# Patient Record
Sex: Male | Born: 1940 | Race: White | Hispanic: No | Marital: Married | Smoking: Current every day smoker
Health system: Southern US, Community
[De-identification: ages and names within clinical notes are randomized; demographics above are authoritative.]

## PROBLEM LIST (undated history)

## (undated) DIAGNOSIS — R3129 Other microscopic hematuria: Secondary | ICD-10-CM

## (undated) DIAGNOSIS — K219 Gastro-esophageal reflux disease without esophagitis: Secondary | ICD-10-CM

## (undated) DIAGNOSIS — R911 Solitary pulmonary nodule: Secondary | ICD-10-CM

## (undated) DIAGNOSIS — M4800 Spinal stenosis, site unspecified: Secondary | ICD-10-CM

## (undated) DIAGNOSIS — I10 Essential (primary) hypertension: Secondary | ICD-10-CM

## (undated) DIAGNOSIS — E785 Hyperlipidemia, unspecified: Secondary | ICD-10-CM

## (undated) DIAGNOSIS — H269 Unspecified cataract: Secondary | ICD-10-CM

## (undated) DIAGNOSIS — R29898 Other symptoms and signs involving the musculoskeletal system: Secondary | ICD-10-CM

## (undated) DIAGNOSIS — M5126 Other intervertebral disc displacement, lumbar region: Secondary | ICD-10-CM

## (undated) DIAGNOSIS — T4145XA Adverse effect of unspecified anesthetic, initial encounter: Secondary | ICD-10-CM

## (undated) DIAGNOSIS — T8859XA Other complications of anesthesia, initial encounter: Secondary | ICD-10-CM

## (undated) DIAGNOSIS — Z720 Tobacco use: Secondary | ICD-10-CM

## (undated) DIAGNOSIS — D126 Benign neoplasm of colon, unspecified: Secondary | ICD-10-CM

## (undated) DIAGNOSIS — I4891 Unspecified atrial fibrillation: Secondary | ICD-10-CM

## (undated) DIAGNOSIS — K469 Unspecified abdominal hernia without obstruction or gangrene: Secondary | ICD-10-CM

## (undated) DIAGNOSIS — N400 Enlarged prostate without lower urinary tract symptoms: Secondary | ICD-10-CM

## (undated) DIAGNOSIS — Z860100 Personal history of colon polyps, unspecified: Secondary | ICD-10-CM

## (undated) DIAGNOSIS — C801 Malignant (primary) neoplasm, unspecified: Secondary | ICD-10-CM

## (undated) DIAGNOSIS — N529 Male erectile dysfunction, unspecified: Secondary | ICD-10-CM

## (undated) DIAGNOSIS — I2510 Atherosclerotic heart disease of native coronary artery without angina pectoris: Secondary | ICD-10-CM

## (undated) HISTORY — PX: TONSILLECTOMY: SHX5217

## (undated) HISTORY — DX: Unspecified atrial fibrillation: I48.91

## (undated) HISTORY — DX: Hyperlipidemia, unspecified: E78.5

## (undated) HISTORY — DX: Benign prostatic hyperplasia without lower urinary tract symptoms: N40.0

## (undated) HISTORY — PX: BACK SURGERY: SHX140

## (undated) HISTORY — DX: Other intervertebral disc displacement, lumbar region: M51.26

## (undated) HISTORY — DX: Spinal stenosis, site unspecified: M48.00

## (undated) HISTORY — DX: Atherosclerotic heart disease of native coronary artery without angina pectoris: I25.10

## (undated) HISTORY — DX: Other symptoms and signs involving the musculoskeletal system: R29.898

## (undated) HISTORY — DX: Other microscopic hematuria: R31.29

## (undated) HISTORY — DX: Personal history of colon polyps, unspecified: Z86.0100

## (undated) HISTORY — DX: Gastro-esophageal reflux disease without esophagitis: K21.9

## (undated) HISTORY — DX: Personal history of colonic polyps: Z86.010

## (undated) HISTORY — DX: Benign neoplasm of colon, unspecified: D12.6

## (undated) HISTORY — DX: Male erectile dysfunction, unspecified: N52.9

## (undated) HISTORY — PX: INGUINAL HERNIA REPAIR: SHX194

## (undated) HISTORY — DX: Tobacco use: Z72.0

## (undated) HISTORY — DX: Essential (primary) hypertension: I10

## (undated) HISTORY — DX: Solitary pulmonary nodule: R91.1

---

## 2000-02-19 HISTORY — PX: COLONOSCOPY: SHX174

## 2004-12-04 ENCOUNTER — Ambulatory Visit: Payer: Self-pay | Admitting: Internal Medicine

## 2004-12-18 ENCOUNTER — Ambulatory Visit: Payer: Self-pay | Admitting: Internal Medicine

## 2004-12-24 ENCOUNTER — Ambulatory Visit: Payer: Self-pay | Admitting: Internal Medicine

## 2005-01-04 ENCOUNTER — Ambulatory Visit: Payer: Self-pay | Admitting: Cardiology

## 2005-01-23 ENCOUNTER — Ambulatory Visit: Payer: Self-pay | Admitting: Internal Medicine

## 2006-04-30 ENCOUNTER — Ambulatory Visit: Payer: Self-pay | Admitting: Internal Medicine

## 2006-04-30 LAB — CONVERTED CEMR LAB
ALT: 32 units/L (ref 0–40)
AST: 30 units/L (ref 0–37)
Albumin: 3.5 g/dL (ref 3.5–5.2)
Alkaline Phosphatase: 79 units/L (ref 39–117)
BUN: 15 mg/dL (ref 6–23)
Basophils Absolute: 0 10*3/uL (ref 0.0–0.1)
Basophils Relative: 0.3 % (ref 0.0–1.0)
Bilirubin Urine: NEGATIVE
Bilirubin, Direct: 0.2 mg/dL (ref 0.0–0.3)
CO2: 29 meq/L (ref 19–32)
Calcium: 9 mg/dL (ref 8.4–10.5)
Chloride: 106 meq/L (ref 96–112)
Cholesterol: 125 mg/dL (ref 0–200)
Creatinine, Ser: 1.1 mg/dL (ref 0.4–1.5)
Crystals: NEGATIVE
Eosinophils Absolute: 0.1 10*3/uL (ref 0.0–0.6)
Eosinophils Relative: 0.6 % (ref 0.0–5.0)
GFR calc Af Amer: 86 mL/min
GFR calc non Af Amer: 71 mL/min
Glucose, Bld: 118 mg/dL — ABNORMAL HIGH (ref 70–99)
HCT: 39.8 % (ref 39.0–52.0)
HDL: 35.3 mg/dL — ABNORMAL LOW (ref >39.0)
Hemoglobin: 13.8 g/dL (ref 13.0–17.0)
Ketones, ur: NEGATIVE mg/dL
LDL Cholesterol: 73 mg/dL (ref 0–99)
Leukocytes, UA: NEGATIVE
Lymphocytes Relative: 33.5 % (ref 12.0–46.0)
MCHC: 34.7 g/dL (ref 30.0–36.0)
MCV: 88.1 fL (ref 78.0–100.0)
Monocytes Absolute: 1.1 10*3/uL — ABNORMAL HIGH (ref 0.2–0.7)
Monocytes Relative: 12 % — ABNORMAL HIGH (ref 3.0–11.0)
Mucus, UA: NEGATIVE
Neutro Abs: 4.7 10*3/uL (ref 1.4–7.7)
Neutrophils Relative %: 53.6 % (ref 43.0–77.0)
Nitrite: NEGATIVE
PSA: 2.15 ng/mL (ref 0.10–4.00)
Platelets: 182 10*3/uL (ref 150–400)
Potassium: 4 meq/L (ref 3.5–5.1)
RBC: 4.52 M/uL (ref 4.22–5.81)
RDW: 13.8 % (ref 11.5–14.6)
Sodium: 143 meq/L (ref 135–145)
Specific Gravity, Urine: 1.025 (ref 1.000–1.03)
TSH: 1.36 microintl units/mL (ref 0.35–5.50)
Total Bilirubin: 0.6 mg/dL (ref 0.3–1.2)
Total CHOL/HDL Ratio: 3.5
Total Protein, Urine: NEGATIVE mg/dL
Total Protein: 7.1 g/dL (ref 6.0–8.3)
Triglycerides: 82 mg/dL (ref 0–149)
Urine Glucose: NEGATIVE mg/dL
Urobilinogen, UA: 0.2 (ref 0.0–1.0)
VLDL: 16 mg/dL (ref 0–40)
WBC: 8.9 10*3/uL (ref 4.5–10.5)
pH: 6 (ref 5.0–8.0)

## 2006-05-05 ENCOUNTER — Ambulatory Visit: Payer: Self-pay | Admitting: Internal Medicine

## 2006-05-19 ENCOUNTER — Encounter: Payer: Self-pay | Admitting: Internal Medicine

## 2006-05-20 ENCOUNTER — Ambulatory Visit: Payer: Self-pay

## 2006-05-20 ENCOUNTER — Encounter: Payer: Self-pay | Admitting: Internal Medicine

## 2006-06-16 ENCOUNTER — Ambulatory Visit: Payer: Self-pay | Admitting: Internal Medicine

## 2007-02-05 ENCOUNTER — Ambulatory Visit: Payer: Self-pay | Admitting: Internal Medicine

## 2007-02-05 DIAGNOSIS — I10 Essential (primary) hypertension: Secondary | ICD-10-CM

## 2007-02-05 DIAGNOSIS — E782 Mixed hyperlipidemia: Secondary | ICD-10-CM

## 2007-02-05 LAB — CONVERTED CEMR LAB
ALT: 34 units/L (ref 0–53)
AST: 22 units/L (ref 0–37)
Albumin: 3.7 g/dL (ref 3.5–5.2)
Alkaline Phosphatase: 77 units/L (ref 39–117)
BUN: 13 mg/dL (ref 6–23)
Bilirubin, Direct: 0.1 mg/dL (ref 0.0–0.3)
CO2: 30 meq/L (ref 19–32)
Calcium: 9.2 mg/dL (ref 8.4–10.5)
Chloride: 109 meq/L (ref 96–112)
Cholesterol: 116 mg/dL (ref 0–200)
Creatinine, Ser: 1 mg/dL (ref 0.4–1.5)
GFR calc Af Amer: 96 mL/min
GFR calc non Af Amer: 79 mL/min
Glucose, Bld: 109 mg/dL — ABNORMAL HIGH (ref 70–99)
HDL: 33.5 mg/dL — ABNORMAL LOW (ref >39.0)
Hgb A1c MFr Bld: 6 % (ref 4.6–6.0)
LDL Cholesterol: 69 mg/dL (ref 0–99)
Potassium: 4.5 meq/L (ref 3.5–5.1)
Sodium: 145 meq/L (ref 135–145)
Total Bilirubin: 0.7 mg/dL (ref 0.3–1.2)
Total CHOL/HDL Ratio: 3.5
Total Protein: 7.1 g/dL (ref 6.0–8.3)
Triglycerides: 66 mg/dL (ref 0–149)
VLDL: 13 mg/dL (ref 0–40)

## 2007-02-09 DIAGNOSIS — D126 Benign neoplasm of colon, unspecified: Secondary | ICD-10-CM | POA: Insufficient documentation

## 2007-02-10 ENCOUNTER — Ambulatory Visit: Payer: Self-pay | Admitting: Internal Medicine

## 2007-02-10 DIAGNOSIS — M545 Low back pain: Secondary | ICD-10-CM

## 2007-02-10 DIAGNOSIS — R3129 Other microscopic hematuria: Secondary | ICD-10-CM

## 2007-02-10 DIAGNOSIS — F17200 Nicotine dependence, unspecified, uncomplicated: Secondary | ICD-10-CM

## 2007-08-07 ENCOUNTER — Ambulatory Visit: Payer: Self-pay | Admitting: Internal Medicine

## 2007-08-07 ENCOUNTER — Encounter: Payer: Self-pay | Admitting: Internal Medicine

## 2007-08-07 ENCOUNTER — Other Ambulatory Visit: Admission: RE | Admit: 2007-08-07 | Discharge: 2007-08-07 | Payer: Self-pay | Admitting: Internal Medicine

## 2007-08-07 LAB — CONVERTED CEMR LAB
ALT: 30 units/L (ref 0–53)
AST: 22 units/L (ref 0–37)
Albumin: 4 g/dL (ref 3.5–5.2)
Alkaline Phosphatase: 78 units/L (ref 39–117)
BUN: 13 mg/dL (ref 6–23)
Bacteria, UA: NEGATIVE
Bilirubin Urine: NEGATIVE
Bilirubin, Direct: 0.1 mg/dL (ref 0.0–0.3)
CO2: 30 meq/L (ref 19–32)
Calcium: 9.5 mg/dL (ref 8.4–10.5)
Chloride: 106 meq/L (ref 96–112)
Cholesterol: 136 mg/dL (ref 0–200)
Creatinine, Ser: 1 mg/dL (ref 0.4–1.5)
Crystals: NEGATIVE
GFR calc Af Amer: 96 mL/min
GFR calc non Af Amer: 79 mL/min
Glucose, Bld: 105 mg/dL — ABNORMAL HIGH (ref 70–99)
HDL: 39 mg/dL (ref >39.0)
Hgb A1c MFr Bld: 5.9 % (ref 4.6–6.0)
Ketones, ur: NEGATIVE mg/dL
LDL Cholesterol: 84 mg/dL (ref 0–99)
Leukocytes, UA: NEGATIVE
Mucus, UA: NEGATIVE
Nitrite: NEGATIVE
PSA: 2.29 ng/mL (ref 0.10–4.00)
Potassium: 4.1 meq/L (ref 3.5–5.1)
Sodium: 142 meq/L (ref 135–145)
Specific Gravity, Urine: 1.01 (ref 1.000–1.03)
Squamous Epithelial / HPF: NEGATIVE /lpf
Total Bilirubin: 0.7 mg/dL (ref 0.3–1.2)
Total CHOL/HDL Ratio: 3.5
Total Protein, Urine: NEGATIVE mg/dL
Total Protein: 7.5 g/dL (ref 6.0–8.3)
Triglycerides: 63 mg/dL (ref 0–149)
Urine Glucose: NEGATIVE mg/dL
Urobilinogen, UA: 0.2 (ref 0.0–1.0)
VLDL: 13 mg/dL (ref 0–40)
WBC, UA: NONE SEEN cells/hpf
pH: 6.5 (ref 5.0–8.0)

## 2007-08-09 DIAGNOSIS — N401 Enlarged prostate with lower urinary tract symptoms: Secondary | ICD-10-CM

## 2007-08-12 ENCOUNTER — Telehealth: Payer: Self-pay | Admitting: Internal Medicine

## 2007-08-28 ENCOUNTER — Telehealth: Payer: Self-pay | Admitting: Internal Medicine

## 2007-10-20 ENCOUNTER — Ambulatory Visit: Payer: Self-pay | Admitting: Internal Medicine

## 2007-10-20 DIAGNOSIS — F528 Other sexual dysfunction not due to a substance or known physiological condition: Secondary | ICD-10-CM

## 2007-11-27 ENCOUNTER — Telehealth: Payer: Self-pay | Admitting: Internal Medicine

## 2008-03-29 ENCOUNTER — Telehealth: Payer: Self-pay | Admitting: Internal Medicine

## 2008-03-30 ENCOUNTER — Telehealth: Payer: Self-pay | Admitting: Internal Medicine

## 2008-04-05 ENCOUNTER — Ambulatory Visit: Payer: Self-pay | Admitting: Internal Medicine

## 2008-04-05 LAB — CONVERTED CEMR LAB
ALT: 29 units/L (ref 0–53)
AST: 24 units/L (ref 0–37)
Albumin: 3.7 g/dL (ref 3.5–5.2)
Alkaline Phosphatase: 72 units/L (ref 39–117)
BUN: 16 mg/dL (ref 6–23)
Basophils Absolute: 0 10*3/uL (ref 0.0–0.1)
Basophils Relative: 0.4 % (ref 0.0–3.0)
Bilirubin, Direct: 0.1 mg/dL (ref 0.0–0.3)
CO2: 30 meq/L (ref 19–32)
Calcium: 9.4 mg/dL (ref 8.4–10.5)
Chloride: 107 meq/L (ref 96–112)
Cholesterol: 149 mg/dL (ref 0–200)
Creatinine, Ser: 0.9 mg/dL (ref 0.4–1.5)
Eosinophils Absolute: 0.1 10*3/uL (ref 0.0–0.7)
Eosinophils Relative: 1.1 % (ref 0.0–5.0)
GFR calc Af Amer: 108 mL/min
GFR calc non Af Amer: 89 mL/min
Glucose, Bld: 102 mg/dL — ABNORMAL HIGH (ref 70–99)
HCT: 46.6 % (ref 39.0–52.0)
HDL: 37.8 mg/dL — ABNORMAL LOW (ref >39.0)
Hemoglobin: 16.2 g/dL (ref 13.0–17.0)
LDL Cholesterol: 86 mg/dL (ref 0–99)
Lymphocytes Relative: 38.3 % (ref 12.0–46.0)
MCHC: 34.7 g/dL (ref 30.0–36.0)
MCV: 90 fL (ref 78.0–100.0)
Monocytes Absolute: 0.4 10*3/uL (ref 0.1–1.0)
Monocytes Relative: 3.9 % (ref 3.0–12.0)
Neutro Abs: 6.6 10*3/uL (ref 1.4–7.7)
Neutrophils Relative %: 56.3 % (ref 43.0–77.0)
Platelets: 217 10*3/uL (ref 150–400)
Potassium: 4.3 meq/L (ref 3.5–5.1)
RBC: 5.17 M/uL (ref 4.22–5.81)
RDW: 13.7 % (ref 11.5–14.6)
Sodium: 143 meq/L (ref 135–145)
TSH: 0.89 microintl units/mL (ref 0.35–5.50)
Total Bilirubin: 0.9 mg/dL (ref 0.3–1.2)
Total CHOL/HDL Ratio: 3.9
Total Protein: 7.3 g/dL (ref 6.0–8.3)
Triglycerides: 128 mg/dL (ref 0–149)
VLDL: 26 mg/dL (ref 0–40)
WBC: 11.5 10*3/uL — ABNORMAL HIGH (ref 4.5–10.5)

## 2008-04-12 ENCOUNTER — Ambulatory Visit: Payer: Self-pay | Admitting: Internal Medicine

## 2008-04-18 DIAGNOSIS — D126 Benign neoplasm of colon, unspecified: Secondary | ICD-10-CM

## 2008-04-18 HISTORY — DX: Benign neoplasm of colon, unspecified: D12.6

## 2008-04-28 ENCOUNTER — Ambulatory Visit: Payer: Self-pay | Admitting: Gastroenterology

## 2008-05-02 ENCOUNTER — Telehealth: Payer: Self-pay | Admitting: Internal Medicine

## 2008-05-12 ENCOUNTER — Ambulatory Visit: Payer: Self-pay | Admitting: Gastroenterology

## 2008-05-12 ENCOUNTER — Encounter: Payer: Self-pay | Admitting: Gastroenterology

## 2008-05-13 ENCOUNTER — Encounter: Payer: Self-pay | Admitting: Gastroenterology

## 2008-10-19 ENCOUNTER — Ambulatory Visit: Payer: Self-pay | Admitting: Family Medicine

## 2009-03-23 ENCOUNTER — Telehealth: Payer: Self-pay | Admitting: Family Medicine

## 2009-05-29 ENCOUNTER — Encounter: Admission: RE | Admit: 2009-05-29 | Discharge: 2009-05-29 | Payer: Self-pay | Admitting: Family Medicine

## 2009-05-29 ENCOUNTER — Ambulatory Visit: Payer: Self-pay | Admitting: Family Medicine

## 2009-05-30 LAB — CONVERTED CEMR LAB
ALT: 31 units/L (ref 0–53)
AST: 22 units/L (ref 0–37)
Albumin: 4.1 g/dL (ref 3.5–5.2)
Alkaline Phosphatase: 75 units/L (ref 39–117)
BUN: 13 mg/dL (ref 6–23)
Basophils Absolute: 0 10*3/uL (ref 0.0–0.1)
Basophils Relative: 0.4 % (ref 0.0–3.0)
Bilirubin, Direct: 0 mg/dL (ref 0.0–0.3)
CO2: 29 meq/L (ref 19–32)
Calcium: 9.5 mg/dL (ref 8.4–10.5)
Chloride: 106 meq/L (ref 96–112)
Cholesterol: 143 mg/dL (ref 0–200)
Creatinine, Ser: 1 mg/dL (ref 0.4–1.5)
Eosinophils Absolute: 0.2 10*3/uL (ref 0.0–0.7)
Eosinophils Relative: 1.6 % (ref 0.0–5.0)
GFR calc non Af Amer: 78.78 mL/min (ref >60)
Glucose, Bld: 96 mg/dL (ref 70–99)
HCT: 45.6 % (ref 39.0–52.0)
HDL: 44.4 mg/dL (ref >39.00)
Hemoglobin: 15.9 g/dL (ref 13.0–17.0)
LDL Cholesterol: 75 mg/dL (ref 0–99)
Lymphocytes Relative: 36.3 % (ref 12.0–46.0)
Lymphs Abs: 4 10*3/uL (ref 0.7–4.0)
MCHC: 34.8 g/dL (ref 30.0–36.0)
MCV: 90.3 fL (ref 78.0–100.0)
Monocytes Absolute: 0.7 10*3/uL (ref 0.1–1.0)
Monocytes Relative: 6.2 % (ref 3.0–12.0)
Neutro Abs: 6.1 10*3/uL (ref 1.4–7.7)
Neutrophils Relative %: 55.5 % (ref 43.0–77.0)
PSA: 1.28 ng/mL (ref 0.10–4.00)
Platelets: 201 10*3/uL (ref 150.0–400.0)
Potassium: 4.6 meq/L (ref 3.5–5.1)
RBC: 5.05 M/uL (ref 4.22–5.81)
RDW: 14.7 % — ABNORMAL HIGH (ref 11.5–14.6)
Sodium: 143 meq/L (ref 135–145)
Total Bilirubin: 0.6 mg/dL (ref 0.3–1.2)
Total CHOL/HDL Ratio: 3
Total Protein: 8.2 g/dL (ref 6.0–8.3)
Triglycerides: 119 mg/dL (ref 0.0–149.0)
VLDL: 23.8 mg/dL (ref 0.0–40.0)
WBC: 10.9 10*3/uL — ABNORMAL HIGH (ref 4.5–10.5)

## 2009-06-08 ENCOUNTER — Encounter: Payer: Self-pay | Admitting: Family Medicine

## 2009-12-12 ENCOUNTER — Telehealth: Payer: Self-pay | Admitting: Family Medicine

## 2010-03-20 NOTE — Progress Notes (Signed)
Summary: refill request for proscar and viagra  Phone Note Refill Request Message from:  Patient  Refills Requested: Medication #1:  PROSCAR 5 MG TABS Take 1 tablet by mouth once a day  Medication #2:  VIAGRA 100 MG TABS one by mouth once daily prn Please send to [facility].  Initial call taken by: Lowella Petties CMA, AAMA,  December 12, 2009 9:37 AM  Follow-up for Phone Call        ov 05/2009  ok to fill without MD input until 05/2010 Follow-up by: Hannah Beat MD,  December 12, 2009 1:37 PM  Additional Follow-up for Phone Call Additional follow up Details #1::        Dr. Patsy Lager, we arent allowed to refill these meds without MD approval, thats why we send them to you everytime, I know it's a hassle, but thats our rule.  Do you want to give a years refills on viagra? Additional Follow-up by: Lowella Petties CMA, AAMA,  December 12, 2009 2:23 PM    Additional Follow-up for Phone Call Additional follow up Details #2::    My understanding is non-controlled substances, including proscar and viagra can be refilled by [facility] protocol.  Dr. Alphonsus Sias - is this not right? We need to make sure everyone on same protocol, refills, etc. I thought we had talked about this a while ago.  If not, we should revisit at next MD meeting.  Thanks. Spencer Copland MD  December 12, 2009 3:23 PM   No actually if the patient has kept up with exams, all non controlled meds may be refilled per protocol for 1 year. We can review this again at our next staff meeting Cindee Salt MD  December 13, 2009 7:44 AM   Additional Follow-up for Phone Call Additional follow up Details #3:: Details for Additional Follow-up Action Taken: Jacki Cones, please refill x 1 year. Thanks. Hannah Beat MD  December 13, 2009 8:51 AM   Meds refilled.              Lowella Petties CMA, AAMA  December 13, 2009 9:33 AM   Prescriptions: VIAGRA 100 MG TABS (SILDENAFIL CITRATE) one by mouth once daily prn  #10 x 11   Entered  by:   Lowella Petties CMA, AAMA   Authorized by:   Hannah Beat MD   Signed by:   Lowella Petties CMA, AAMA on 12/13/2009   Method used:   Electronically to        [id number]       [address]       [place], [place]  [address]       Ph: [phone]       Fax: [phone]   RxID:   [id number] PROSCAR 5 MG TABS (FINASTERIDE) Take 1 tablet by mouth once a day  #90 x 3   Entered by:   Lowella Petties CMA, AAMA   Authorized by:   Hannah Beat MD   Signed by:   Lowella Petties CMA, AAMA on 12/13/2009   Method used:   Electronically to        [id number] (retail[facility]       [address]       [place], [place]  [address]       Ph: [phone]       Fax: [phone]   RxID:   [id number]

## 2010-03-20 NOTE — Progress Notes (Signed)
Summary: refill request for viagra  Phone Note Refill Request Message from:  Fax from Pharmacy  Refills Requested: Medication #1:  VIAGRA 100 MG TABS one by mouth once daily prn   Last Refilled: 08/13/2008 Faxed request from [facility] [address]  Initial call taken by: Lowella Petties CMA,  March 23, 2009 3:53 PM  Follow-up for Phone Call        Patient seen within 1 year, physician input not needed. Following [facility] protocols, please refill this medication for 1 year. Follow-up by: Hannah Beat MD,  March 23, 2009 4:09 PM  Additional Follow-up for Phone Call Additional follow up Details #1::        Refills sent electronically Additional Follow-up by: Lowella Petties CMA,  March 23, 2009 4:17 PM    Prescriptions: VIAGRA 100 MG TABS (SILDENAFIL CITRATE) one by mouth once daily prn  #10 x 11   Entered by:   Lowella Petties CMA   Authorized by:   Hannah Beat MD   Signed by:   Lowella Petties CMA on 03/23/2009   Method used:   Electronically to        [facility] [address] [phone]* (retail)       [address] [place], [place]  [id number]       Ph: [phone]       Fax: [phone]   RxID:   [id number]

## 2010-03-20 NOTE — Assessment & Plan Note (Signed)
Summary: F/U   Vital Signs:  Patient profile:   70 year old male Height:      70 inches Weight:      191.4 pounds BMI:     27.56 Temp:     97.8 degrees F oral Pulse rate:   78 / minute Pulse rhythm:   regular BP sitting:   122 / 78  (left arm) Cuff size:   regular  Vitals Entered By: Benny Lennert CMA Duncan Dull) (May 29, 2009 8:16 AM)  History of Present Illness: Chief complaint follow up, and generalized check up, health maintenance  HYPERLIPIDEMIA: on statin  ESSENTIAL HYPERTENSION : tol meds   Prostate:  Tdap Zoster  check prostate  Back pain: Back is still ongoing and pain. Not so active this winter. Back is the biggest thing. B feet some going down the back of his right leg. and also down the post leg.  3 or 4 years ago -- maybe a bit more frequent now.      Preventive Screening-Counseling & Management  Alcohol-Tobacco     Alcohol Counseling: not indicated; use of alcohol is not excessive or problematic     Smoking Status: current     Smoking Cessation Counseling: yes     Packs/Day: 1-1/2 pack per day  Caffeine-Diet-Exercise     Caffeine use/day: 4     Diet Counseling: to improve diet; diet is suboptimal     Does Patient Exercise: no     Exercise Counseling: to improve exercise regimen  Hep-HIV-STD-Contraception     STD Risk: no risk noted     Testicular SE Education/Counseling to perform regular STE      Sexual History:  currently monogamous.    Allergies (verified): No Known Drug Allergies  Past History:  Past medical, surgical, family and social histories (including risk factors) reviewed, and no changes noted (except as noted below).  Past Medical History: Reviewed history from 04/12/2008 and no changes required. Hypertension GERD History of colon polyps BPH Hypertension Hyperlipidemia Microscopic hematuria ED   Past Surgical History: Reviewed history from 02/10/2007 and no changes required. Inguinal  herniorrhaphy Tonsillectomy Colonoscopy 2002  Past History:  Care Management: Gastroenterology: Dr. Russella Dar  Family History: Reviewed history from 04/12/2008 and no changes required. Mother deceased at age 23 secondary to old age father deceased at age 43 with history of coronary artery disease, stroke and diabetes   Social History: Reviewed history from 04/12/2008 and no changes required. Retired from Psychiatric nurse Married with 5 children Current Smoker Alcohol use-no  STD Risk:  no risk noted Sexual History:  currently monogamous  Review of Systems General:  Denies chills, fatigue, and fever. Eyes:  Denies blurring. ENT:  Denies nasal congestion. CV:  Denies chest pain or discomfort and shortness of breath with exertion. Resp:  Denies cough and shortness of breath. GI:  Denies nausea and vomiting. GU:  Denies urinary frequency and urinary hesitancy. MS:  See HPI. Derm:  Denies rash. Neuro:  + radiculopathy. Psych:  Denies anxiety and depression. Endo:  Denies cold intolerance, excessive hunger, excessive thirst, excessive urination, heat intolerance, and polyuria. Heme:  Denies abnormal bruising and enlarge lymph nodes. Allergy:  Denies seasonal allergies.  Physical Exam  General:  Well-developed,well-nourished,in no acute distress; alert,appropriate and cooperative throughout examination Head:  Normocephalic and atraumatic without obvious abnormalities. No apparent alopecia or balding. Eyes:  No corneal or conjunctival inflammation noted. EOMI. Perrla. Vision grossly normal. Ears:  External ear exam shows no significant lesions or deformities.  Otoscopic examination reveals clear canals, tympanic membranes are intact bilaterally without bulging, retraction, inflammation or discharge. Hearing is grossly normal bilaterally. Nose:  External nasal examination shows no deformity or inflammation. Nasal mucosa are pink and moist without lesions or exudates. Mouth:  Oral  mucosa and oropharynx without lesions or exudates.  Teeth in good repair. Neck:  No deformities, masses, or tenderness noted. Chest Wall:  No deformities, masses, tenderness or gynecomastia noted. Lungs:  Normal respiratory effort, chest expands symmetrically. Lungs are clear to auscultation, no crackles or wheezes. Heart:  Normal rate and regular rhythm. S1 and S2 normal without gallop, murmur, click, rub or other extra sounds. Abdomen:  Bowel sounds positive,abdomen soft and non-tender without masses, organomegaly or hernias noted. Rectal:  No external abnormalities noted. Normal sphincter tone. No rectal masses or tenderness. Prostate:  Moderately enlarged prostate. no nodules, no asymmetry, and no induration.   Skin:  Intact without suspicious lesions or rashes Cervical Nodes:  No lymphadenopathy noted Psych:  Cognition and judgment appear intact. Alert and cooperative with normal attention span and concentration. No apparent delusions, illusions, hallucinations Additional Exam:  Range of motion at  the waist: Flexion: mild pain Extension: mild pain Rotation: mild pain  No echymosis or edema Rises to examination table with mild difficulty Gait: minimally antalgic   Inspection/Deformity: N Paraspinus T: Y  B Ankle Dorsiflexion (L5,4): 5/5 B Great Toe Dorsiflexion (L5,4): 5/5 Heel Walk (L5): WNL Toe Walk (S1): WNL Rise/Squat (L4): WNL, mild pain  SENSORY B Medial Foot (L4): WNL B Dorsum (L5): WNL B Lateral (S1): WNL Light Touch: WNL Pinprick: WNL  REFLEXES Knee (L4): 2+ Ankle (S1): 2+  B SLR, seated: neg B SLR, supine: neg B FABER: neg B Reverse FABER: neg B Greater Troch: NT B Log Roll: neg B Stork: NT B Sciatic Notch: NT Leg Lengths: equal    Impression & Recommendations:  Problem # 1:  HEALTH MAINTENANCE EXAM (ICD-V70.0)  The patient's preventative maintenance and recommended screening tests for an annual wellness exam were reviewed in full today. Brought  up to date unless services declined.  Counselled on the importance of diet, exercise, and its role in overall health and mortality. The patient's FH and SH was reviewed, including their home life, tobacco status, and drug and alcohol status.   Orders: Subsequent annual wellness visit with prevention plan (J5009)  Problem # 2:  BACK PAIN, LUMBAR, CHRONIC (ICD-724.2) Assessment: Deteriorated Suspect DDD, likely disk vs spinal stenosis as well  His updated medication list for this problem includes:    Low-dose Aspirin 81 Mg Tabs (Aspirin) .Marland Kitchen... Take 1 tablet by mouth once a day    Cyclobenzaprine Hcl 10 Mg Tabs (Cyclobenzaprine hcl) .Marland Kitchen... 1 by mouth 3  times daily as needed for back pain    Meloxicam 15 Mg Tabs (Meloxicam) .Marland Kitchen... 1 by mouth daily  Orders: Physical Therapy Referral (PT) Radiology Referral (Radiology) Prescription Created Electronically [phone])  Problem # 3:  HYPERLIPIDEMIA (ICD-272.4)  His updated medication list for this problem includes:    Simvastatin 10 Mg Tabs (Simvastatin) ..... One by mouth qhs  Orders: TLB-Lipid Panel (80061-LIPID) TLB-Hepatic/Liver Function Pnl (80076-HEPATIC)  Complete Medication List: 1)  Amlodipine Besylate 10 Mg Tabs (Amlodipine besylate) .... Take 1/2 tablet by mouth once a day 2)  Lisinopril 20 Mg Tabs (Lisinopril) .... Take 1 tablet by mouth once a day 3)  Low-dose Aspirin 81 Mg Tabs (Aspirin) .... Take 1 tablet by mouth once a day 4)  Proscar 5 Mg Tabs (  Finasteride) .... Take 1 tablet by mouth once a day 5)  Viagra 100 Mg Tabs (Sildenafil citrate) .... One by mouth once daily prn 6)  Terazosin Hcl 5 Mg Caps (Terazosin hcl) .... 2 by mouth at bedtime 7)  Simvastatin 10 Mg Tabs (Simvastatin) .... One by mouth qhs 8)  Terazosin Hcl 10 Mg Caps (Terazosin hcl) .Marland Kitchen.. 1 by mouth at bedtime 9)  Cyclobenzaprine Hcl 10 Mg Tabs (Cyclobenzaprine hcl) .Marland Kitchen.. 1 by mouth 3  times daily as needed for back pain 10)  Meloxicam 15 Mg Tabs (Meloxicam)  .Marland Kitchen.. 1 by mouth daily  Other Orders: Venipuncture ([address]) Tdap => 70yrs IM ([address]) Admin 1st Vaccine ([address]) TLB-BMP (Basic Metabolic Panel-BMET) (80048-METABOL) TLB-CBC Platelet - w/Differential (85025-CBCD) TLB-PSA (Prostate Specific Antigen) (84153-PSA)  Patient Instructions: 1)  Check on Zostavax - shingles vaccine with your insurance. 2)  Referral Appointment Information 3)  Day/Date: 4)  Time: 5)  Place/MD: 6)  Address: 7)  Phone/Fax: 8)  Patient given appointment information. Information/Orders faxed/mailed.  Prescriptions: SIMVASTATIN 10 MG TABS (SIMVASTATIN) one by mouth qhs  #90 x 3   Entered by:   Benny Lennert CMA (AAMA)   Authorized by:   Hannah Beat MD   Signed by:   Benny Lennert CMA (AAMA) on 05/29/2009   Method used:   Faxed to ...       [facility]* (mail-order)             ,          Ph: [phone]       Fax: [phone]   RxID:   [id number] CYCLOBENZAPRINE HCL 10 MG  TABS (CYCLOBENZAPRINE HCL) 1 by mouth 3  times daily as needed for back pain  #90 x 1   Entered and Authorized by:   Hannah Beat MD   Signed by:   Hannah Beat MD on 05/29/2009   Method used:   Electronically to        [facility]* (mail-order)             ,          Ph: [phone]       Fax: [phone]   RxID:   [id number] SIMVASTATIN 10 MG TABS (SIMVASTATIN) one by mouth qhs  #90 x 3   Entered and Authorized by:   Hannah Beat MD   Signed by:   Hannah Beat MD on 05/29/2009   Method used:   Electronically to        [facility]* (mail-order)             ,          Ph: [phone]       Fax: [phone]   RxID:   [id number] TERAZOSIN HCL 5 MG  CAPS (TERAZOSIN HCL) 2 by mouth at bedtime  #180 x 3   Entered and Authorized by:   Hannah Beat MD   Signed by:   Hannah Beat MD on 05/29/2009   Method used:   Electronically to        [facility]* (mail-order)             ,          Ph: [phone]       Fax: [phone]    RxID:   [id number] PROSCAR 5 MG TABS (FINASTERIDE) Take 1 tablet by mouth once a day  #90 x 3   Entered and Authorized by:   Hannah Beat MD   Signed by:  Hannah Beat MD on 05/29/2009   Method used:   Electronically to        [facility]* (mail-order)             ,          Ph: [phone]       Fax: [phone]   RxID:   [id number] LISINOPRIL 20 MG TABS (LISINOPRIL) Take 1 tablet by mouth once a day  #90 x 3   Entered and Authorized by:   Hannah Beat MD   Signed by:   Hannah Beat MD on 05/29/2009   Method used:   Electronically to        [facility]* (mail-order)             ,          Ph: [phone]       Fax: [phone]   RxID:   [id number] AMLODIPINE BESYLATE 10 MG TABS (AMLODIPINE BESYLATE) Take 1/2 tablet by mouth once a day  #90 x 3   Entered and Authorized by:   Hannah Beat MD   Signed by:   Hannah Beat MD on 05/29/2009   Method used:   Electronically to        [facility]* (mail-order)             ,          Ph: [phone]       Fax: [phone]   RxID:   [id number] MELOXICAM 15 MG TABS (MELOXICAM) 1 by mouth daily  #90 x 1   Entered and Authorized by:   Hannah Beat MD   Signed by:   Hannah Beat MD on 05/29/2009   Method used:   Electronically to        [facility]* (mail-order)             ,          Ph: [phone]       Fax: [phone]   RxID:   [id number]   Current Allergies (reviewed today): No known allergies    Prevention & Chronic Care Immunizations   Influenza vaccine: Fluvax MCR  (02/10/2007)    Tetanus booster: 05/29/2009: Tdap    Pneumococcal vaccine: Pneumovax (Medicare)  (02/10/2007)    H. zoster vaccine: Not documented   H. zoster vaccine deferral: Deferred  (05/29/2009)  Colorectal Screening   Hemoccult: Not documented    Colonoscopy: Location:  [facility].    (05/12/2008)   Colonoscopy due: 04/2011  Other Screening   PSA: 2.29   (08/07/2007)   PSA ordered.   Smoking status: current  (05/29/2009)   Smoking cessation counseling: yes  (05/29/2009)  Lipids   Total Cholesterol: 149  (04/05/2008)   LDL: 86  (04/05/2008)   LDL Direct: Not documented   HDL: 37.8  (04/05/2008)   Triglycerides: 128  (04/05/2008)    SGOT (AST): 24  (04/05/2008)   SGPT (ALT): 29  (04/05/2008)   Alkaline phosphatase: 72  (04/05/2008)   Total bilirubin: 0.9  (04/05/2008)    Lipid flowsheet reviewed?: Yes   Progress toward LDL goal: At goal  Hypertension   Last Blood Pressure: 122 / 78  (05/29/2009)   Serum creatinine: 0.9  (04/05/2008)   Serum potassium 4.3  (04/05/2008)    Hypertension flowsheet reviewed?: Yes   Progress toward BP goal: At goal  Self-Management Support :    Patient will work on the following items until the next clinic visit to reach  self-care goals:     Medications and monitoring: take my medicines every day  (05/29/2009)     Eating: eat foods that are low in salt, eat baked foods instead of fried foods  (05/29/2009)     Activity: take a 30 minute walk every day  (05/29/2009)    Hypertension self-management support: Not documented    Lipid self-management support: Not documented    Immunizations Administered:  Tetanus Vaccine:    Vaccine Type: Tdap    Site: left deltoid    Mfr: [employer]oSmithKline    Dose: 0.5 ml    Route: IM    Given by: Benny Lennert CMA (AAMA)    Exp. Date: 05/13/2011    Lot #: [id number]    VIS given: 01/06/07 version given May 29, 2009.

## 2010-03-20 NOTE — Miscellaneous (Signed)
Summary: PT Initial Eval/[facility]   PT Initial Eval/[facility]   Imported By: Lanelle Bal 06/14/2009 10:54:59  _____________________________________________________________________  External Attachment:    Type:   Image     Comment:   External Document

## 2010-04-30 ENCOUNTER — Encounter (INDEPENDENT_AMBULATORY_CARE_PROVIDER_SITE_OTHER): Payer: PRIVATE HEALTH INSURANCE | Admitting: Internal Medicine

## 2010-04-30 ENCOUNTER — Encounter: Payer: Self-pay | Admitting: Internal Medicine

## 2010-04-30 DIAGNOSIS — J449 Chronic obstructive pulmonary disease, unspecified: Secondary | ICD-10-CM

## 2010-04-30 DIAGNOSIS — I10 Essential (primary) hypertension: Secondary | ICD-10-CM

## 2010-04-30 DIAGNOSIS — M109 Gout, unspecified: Secondary | ICD-10-CM | POA: Insufficient documentation

## 2010-04-30 DIAGNOSIS — E785 Hyperlipidemia, unspecified: Secondary | ICD-10-CM

## 2010-04-30 LAB — CONVERTED CEMR LAB
ALT: 33 units/L (ref 0–53)
AST: 20 units/L (ref 0–37)
Alkaline Phosphatase: 79 units/L (ref 39–117)
BUN: 13 mg/dL (ref 6–23)
Bilirubin, Direct: 0.1 mg/dL (ref 0.0–0.3)
Calcium: 10 mg/dL (ref 8.4–10.5)
Cholesterol: 152 mg/dL (ref 0–200)
Creatinine, Ser: 0.94 mg/dL (ref 0.40–1.50)
Hemoglobin: 15.9 g/dL (ref 13.0–17.0)
Indirect Bilirubin: 0.4 mg/dL (ref 0.0–0.9)
MCHC: 34.1 g/dL (ref 30.0–36.0)
MCV: 89.4 fL (ref 78.0–100.0)
Potassium: 4.2 meq/L (ref 3.5–5.3)
RDW: 14.7 % (ref 11.5–15.5)
Total Protein: 7.6 g/dL (ref 6.0–8.3)
Triglycerides: 100 mg/dL (ref <150)
VLDL: 20 mg/dL (ref 0–40)

## 2010-05-02 ENCOUNTER — Encounter: Payer: Self-pay | Admitting: Internal Medicine

## 2010-05-07 ENCOUNTER — Telehealth: Payer: Self-pay | Admitting: Internal Medicine

## 2010-05-08 NOTE — Letter (Signed)
   [facility] [address] [place], [place]  [address]  [place] Phone: [phone]      May 02, 2010   [facility] [address] [place], [place] [address]  RE:  LAB RESULTS  Dear  Greg Hughes,  The following is an interpretation of your most recent lab tests.  Please take note of any instructions provided or changes to medications that have resulted from your lab work.  ELECTROLYTES:  Good - no changes needed  KIDNEY FUNCTION TESTS:  Good - no changes needed  LIVER FUNCTION TESTS:  Good - no changes needed  LIPID PANEL:  Good - no changes needed Triglyceride: 100   Cholesterol: 152   LDL: 85   HDL: 47   Chol/HDL%:  3.2 Ratio  THYROID STUDIES:  Thyroid studies normal TSH: 0.814      CBC:  Stable - no changes needed  Uric acid level - elevated.       Please avoid foods high in purine content.  This will help prevent gout flares. (see handout)   Sincerely Yours,    Dr. Thomos Lemons  Appended Document:  mailed

## 2010-05-17 NOTE — Progress Notes (Signed)
Summary: Gout flare up  Phone Note Call from Patient Call back at Home Phone [phone] Call back at [phone]   Summary of Call: patients wife called and left voice message stating patient  is having severe gout flare up in his right big toe and wanted to know what Dr Artist Pais advises for patient. Initial call taken by: Glendell Docker CMA,  May 07, 2010 11:32 AM  Follow-up for Phone Call        see rx for prednisone  Follow-up by: D. Thomos Lemons DO,  May 07, 2010 5:23 PM  Additional Follow-up for Phone Call Additional follow up Details #1::        call placed to patient at [phone], he was informed per Dr Artist Pais instructions Additional Follow-up by: Glendell Docker CMA,  May 07, 2010 5:30 PM    New/Updated Medications: PREDNISONE 20 MG TABS (PREDNISONE) one by mouth once daily x 6 days, then 1/2 tab by mouth once daily x 6 days Prescriptions: PREDNISONE 20 MG TABS (PREDNISONE) one by mouth once daily x 6 days, then 1/2 tab by mouth once daily x 6 days  #10 x 0   Entered and Authorized by:   D. Thomos Lemons DO   Signed by:   D. Thomos Lemons DO on 05/07/2010   Method used:   Electronically to        [id number] ([facility]       [address]       [place], [place]  [address]       Ph: [phone]       Fax: [phone]   RxID:   [id number]

## 2010-05-22 NOTE — Assessment & Plan Note (Signed)
Summary: To re-est &  cpx   /hea   Vital Signs:  Patient profile:   70 year old male Height:      70 inches Weight:      189.50 pounds BMI:     27.29 O2 Sat:      97 % on Room air Temp:     97.6 degrees F oral Pulse rate:   68 / minute Resp:     16 per minute BP sitting:   110 / 60  (left arm) Cuff size:   large  Vitals Entered By: Glendell Docker CMA (April 30, 2010 2:40 PM)  O2 Flow:  Room air CC: Re-establish care Is Patient Diabetic? No Pain Assessment Patient in pain? no      Comments dicuss gout in right foot   Primary Care Provider:  Dondra Spry DO  CC:  Re-establish care.  History of Present Illness: 70 y/o white male to re establish  chronic low back pain - worse with lifting and bending tried PT - helped with mobility takes occ NSAIDs back pain occ radiates to right foot  occ left foot numbness sometimes feels normal  intermittent foot pain - probable gout  tob use - no change.  denies severe dyspnea.  intermiitent cough    Preventive Screening-Counseling & Management  Alcohol-Tobacco     Alcohol drinks/day: 0     Smoking Status: current  Caffeine-Diet-Exercise     Caffeine use/day: 2 beverages daily     Does Patient Exercise: no  Allergies (verified): No Known Drug Allergies  Past History:  Past Medical History: Hypertension  GERD History of colon polyps BPH  Hypertension Hyperlipidemia Microscopic hematuria ED  Tobacco use  Past Surgical History: Inguinal herniorrhaphy Tonsillectomy Colonoscopy 2002      Family History: Mother deceased at age 90 secondary to old age father deceased at age 34 with history of coronary artery disease, stroke and diabetes     Social History: 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    Physical Exam  General:  alert, well-developed, and well-nourished.   Head:  normocephalic and atraumatic.   Ears:  R ear normal and L ear normal.   Mouth:  pharynx pink and moist.   Neck:  No deformities, masses, or tenderness noted.no carotid bruits.   Lungs:  normal respiratory effort and normal breath sounds.   Heart:  normal rate, regular rhythm, and no gallop.   Abdomen:  soft, non-tender, and normal bowel sounds.   Extremities:  trace edema  Neurologic:  cranial nerves II-XII intact and gait normal.   Psych:  normally interactive, good eye contact, not anxious appearing, and not depressed appearing.     Impression & Recommendations:  Problem # 1:  UNSPECIFIED ESSENTIAL HYPERTENSION (ICD-401.9) change lisinopril to losartan.  losartan may help lower uric acid levels  The following medications were removed from the medication list:    Terazosin Hcl 5 Mg Caps (Terazosin hcl) .Marland Kitchen... 2 by mouth at bedtime His updated medication list for this problem includes:    Amlodipine Besylate 10 Mg Tabs (Amlodipine besylate) .Marland Kitchen... Take 1/2 tablet by mouth once a day    Losartan Potassium 100 Mg Tabs (Losartan potassium) ..... One by mouth once daily  Terazosin Hcl 10 Mg Caps (Terazosin hcl) .Marland Kitchen... 1 by mouth at bedtime  Orders: T-Basic Metabolic Panel [id number])  BP today: 110/60 Prior BP: 122/78 (05/29/2009)  Labs Reviewed: K+: 4.6 (05/29/2009) Creat: : 1.0 (05/29/2009)   Chol: 143 (05/29/2009)   HDL: 44.40 (05/29/2009)   LDL: 75 (05/29/2009)   TG: 119.0 (05/29/2009)  Problem # 2:  HYPERLIPIDEMIA (ICD-272.4)  His updated medication list for this problem includes:    Simvastatin 10 Mg Tabs (Simvastatin) ..... One by mouth qhs  Orders: T-Hepatic Function [id number]) T-Lipid Profile [phone]) T-TSH [phone])  Labs Reviewed: SGOT: 22 (05/29/2009)   SGPT: 31 (05/29/2009)   HDL:44.40 (05/29/2009), 37.8  (04/05/2008)  LDL:75 (05/29/2009), 86 (04/05/2008)  Chol:143 (05/29/2009), 149 (04/05/2008)  Trig:119.0 (05/29/2009), 128 (04/05/2008)  Problem # 3:  GOUT, UNSPECIFIED (ICD-274.9) mild gout flare of left foot.  tx with low dose prednisone  Orders: T-Uric Acid (Blood) ([id number])  Problem # 4:  COPD (ICD-496) encouraged smoking cessation  Orders: T-CBC No Diff ([id number])  Pulmonary Functions Reviewed: O2 sat: 97 (04/30/2010)     Vaccines Reviewed: Pneumovax: Pneumovax (Medicare) (02/10/2007)   Flu Vax: Declined (04/30/2010)  Complete Medication List: 1)  Amlodipine Besylate 10 Mg Tabs (Amlodipine besylate) .... Take 1/2 tablet by mouth once a day 2)  Losartan Potassium 100 Mg Tabs (Losartan potassium) .... One by mouth once daily 3)  Low-dose Aspirin 81 Mg Tabs (Aspirin) .... Take 1 tablet by mouth once a day 4)  Finasteride 5 Mg Tabs (Finasteride) .... One by mouth pd 5)  Viagra 100 Mg Tabs (Sildenafil citrate) .... One by mouth once daily prn 6)  Simvastatin 10 Mg Tabs (Simvastatin) .... One by mouth qhs 7)  Terazosin Hcl 10 Mg Caps (Terazosin hcl) .Marland Kitchen.. 1 by mouth at bedtime 8)  Fish Oil 1000 Mg Caps (Omega-3 fatty acids) .... Take 1 capsule by mouth two times a day 9)  Vitamin D3 5000 Unit Caps (Cholecalciferol) .... Take 1 capsule by mouth two times a day 10)  Calcium + D + K 750-500-40 Mg-unt-mcg Tabs (Calcium-vitamin d-vitamin k) .... Take 1 tablet by mouth two times a day 11)  Multivitamins Tabs (Multiple vitamin) .... 1/2 tablet  by mouth two times a day 12)  Bupropion Hcl 150 Mg Xr24h-tab (Bupropion hcl) .... One by mouth once daily 13)  Prednisone 20 Mg Tabs (Prednisone) .... One by mouth once daily x 6 days, then 1/2 tab by mouth once daily x 6 days  Patient Instructions: 1)  Please schedule a follow-up appointment in 2 months. Prescriptions: PREDNISONE 10 MG TABS (PREDNISONE) one by mouth once daily x 5 days, then 1/2 by mouth once daily x 6 days  #8 x 0    Entered and Authorized by:   D. Thomos Lemons DO   Signed by:   D. Thomos Lemons DO on 04/30/2010   Method used:   Electronically to        [id number]       [address]       [place], [place]  [address]       Ph: [phone]       Fax: [phone]   RxID:   [id number] BUPROPION HCL 150 MG XR24H-TAB (BUPROPION HCL) one by mouth once daily  #30 x 2   Entered and Authorized by:   D. Thomos Lemons DO   Signed by:   D. Thomos Lemons DO on 04/30/2010   Method used:   [facility]  [address] [id number]       [address]       [place], [place]  [address]       Ph: [phone]       Fax: [phone]   RxID:   [id number] SIMVASTATIN 10 MG TABS (SIMVASTATIN) one by mouth qhs  #90 x 1   Entered and Authorized by:   D. Thomos Lemons DO   Signed by:   D. Thomos Lemons DO on 04/30/2010   Method used:   Electronically to        [id number]       [address]       [place], [place]  [address]       Ph: [phone]       Fax: [phone]   RxID:   [id number] TERAZOSIN HCL 10 MG  CAPS (TERAZOSIN HCL) 1 by mouth at bedtime  #90 x 1   Entered and Authorized by:   D. Thomos Lemons DO   Signed by:   D. Thomos Lemons DO on 04/30/2010   Method used:   Electronically to        [id number]       [address]       [place], [place]  [address]       Ph: [phone]       Fax: [phone]   RxID:   [id number] FINASTERIDE 5 MG TABS (FINASTERIDE) one by mouth pd  #90 x 1   Entered and Authorized by:   D. Thomos Lemons DO   Signed by:   D. Thomos Lemons DO on 04/30/2010   Method used:   Electronically to        [id number]       [address]       [place], [place]  [address]       Ph: [phone]       Fax: [phone]   RxID:   [id number] AMLODIPINE BESYLATE 10 MG TABS (AMLODIPINE BESYLATE) Take 1/2 tablet by mouth once a day  #45 x 1   Entered and Authorized  by:   D. Thomos Lemons DO   Signed by:   D. Thomos Lemons DO on 04/30/2010   Method used:   Electronically to        [id number]       [address]       [place], [place]  [address]       Ph: [phone]       Fax: [phone]   RxID:   [id number] LOSARTAN POTASSIUM 100 MG TABS (LOSARTAN POTASSIUM) one by mouth once daily  #90 x 0   Entered and Authorized by:   D. Thomos Lemons DO   Signed by:   D. Thomos Lemons DO on 04/30/2010   Method used:   Electronically to        [id number]       [address]       [place], [place]  [address]       Ph: [phone]       Fax: [phone]   RxID:   [id number]    Orders Added: 1)  T-Hepatic Function [phone] 2)  T-Lipid Profile [id number] 3)  T-Basic Metabolic Panel [id number] 4)  T-CBC No Diff [[id number]] 5)  T-TSH [[id number]] 6)  T-Uric Acid (Blood) [83662-94765] 7)  Est. Patient Level IV [[address]]  Immunization History:  Influenza Immunization History:    Influenza:  declined (04/30/2010)   Contraindications/Deferment of Procedures/Staging:    Test/Procedure: FLU VAX    Reason for deferment: patient declined   Immunization History:  Influenza Immunization History:    Influenza:  Declined (04/30/2010)  Current Allergies (reviewed today): No known allergies

## 2010-06-13 ENCOUNTER — Other Ambulatory Visit: Payer: Self-pay | Admitting: Family Medicine

## 2010-06-25 ENCOUNTER — Encounter: Payer: Self-pay | Admitting: Internal Medicine

## 2010-06-26 ENCOUNTER — Ambulatory Visit (INDEPENDENT_AMBULATORY_CARE_PROVIDER_SITE_OTHER): Payer: PRIVATE HEALTH INSURANCE | Admitting: Internal Medicine

## 2010-06-26 ENCOUNTER — Encounter: Payer: Self-pay | Admitting: Internal Medicine

## 2010-06-26 DIAGNOSIS — M109 Gout, unspecified: Secondary | ICD-10-CM

## 2010-06-26 DIAGNOSIS — I10 Essential (primary) hypertension: Secondary | ICD-10-CM

## 2010-06-26 MED ORDER — LOSARTAN POTASSIUM 100 MG PO TABS: 100.0000 mg | ORAL_TABLET | Freq: Every day | ORAL | Status: DC

## 2010-06-26 NOTE — Patient Instructions (Signed)
Please avoid foods that may contribute to gout flare (see handout) Please complete the following lab tests before your next follow up appointment: BMET, Uric acid - 274.9

## 2010-06-26 NOTE — Assessment & Plan Note (Signed)
Pt having periodic flares. Flare in March treated with prednisone 04/2010 uric acid level - 8.9  We discussed goal uric acid level of < 6.  We will make decision on whether to use allopurinol based upon uric acid level today.

## 2010-06-26 NOTE — Assessment & Plan Note (Signed)
Well controlled.  Continue current medication regimen.   Chemistry      Component Value Date/Time   NA 143 04/30/2010 2118   K 4.2 04/30/2010 2118   CL 106 04/30/2010 2118   CO2 25 04/30/2010 2118   BUN 13 04/30/2010 2118   CREATININE 0.94 04/30/2010 2118      Component Value Date/Time   CALCIUM 10.0 04/30/2010 2118   ALKPHOS 79 04/30/2010 2118   AST 20 04/30/2010 2118   ALT 33 04/30/2010 2118   BILITOT 0.5 04/30/2010 2118

## 2010-06-26 NOTE — Progress Notes (Signed)
Subjective:    Patient ID: Greg Hughes, male    DOB: November 20, 1940, 70 y.o.   MRN: [id number]  HPI    Review of Systems     Past Medical History  Diagnosis Date  . Hypertension   . GERD (gastroesophageal reflux disease)   . Hyperlipidemia   . History of colon polyps   . BPH (benign prostatic hypertrophy)   . Microscopic hematuria   . ED (erectile dysfunction)   . Tobacco abuse     History   Social History  . Marital Status: Married    Spouse Name: N/A    Number of Children: N/A  . Years of Education: N/A   Occupational History  . Not on file.   Social History Main Topics  . Smoking status: Current Everyday Smoker  . Smokeless tobacco: Not on file  . Alcohol Use: Not on file  . Drug Use: Not on file  . Sexually Active: Not on file   Other Topics Concern  . 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 Inguinal hernia repair   . Tonsillectomy   . Colonoscopy 2002    Family History  Problem Relation Age of Onset  . Diabetes Father   . Coronary artery disease Father   . Stroke Father     No Known Allergies  Current Outpatient Prescriptions on File Prior to Visit  Medication Sig Dispense Refill  . amLODipine (NORVASC) 10 MG tablet Take 10 mg by mouth daily.        Marland Kitchen aspirin 81 MG tablet Take 81 mg by mouth daily.        Marland Kitchen buPROPion (WELLBUTRIN XL) 150 MG 24 hr tablet Take 150 mg by mouth daily.        . Calcium-Vitamin D-Vitamin K 750-500-40 MG-UNT-MCG TABS Take by mouth 2 (two) times daily.        . Cholecalciferol (VITAMIN D3) 5000 UNITS CAPS Take 5,000 Units by mouth 2 (two) times daily.        . finasteride (PROSCAR) 5 MG tablet Take 5 mg by mouth daily.        Marland Kitchen lisinopril (PRINIVIL,ZESTRIL) 20 MG tablet TAKE 1  TABLET BY MOUTH ONCE A DAY  90 tablet  1  . Multiple Vitamin (MULTIVITAMIN) tablet Take 1 tablet by mouth daily.        . Omega-3 Fatty Acids (FISH OIL) 1000 MG CAPS Take 1,000 mg by mouth 2 (two) times daily.        . sildenafil (VIAGRA) 100 MG tablet Take 100 mg by mouth daily as needed.        . simvastatin (ZOCOR) 10 MG tablet TAKE 1 TABLET BY MOUTH AT BEDTIME  90 tablet  1  . terazosin (HYTRIN) 10 MG capsule Take 10 mg by mouth at bedtime.          BP 112/60  Pulse 67  Temp(Src) 97.6 F (36.4 C) (Oral)  Resp 18  Ht 5\' 10"  (1.778 m)  Wt 191 lb (86.637 kg)  BMI 27.41 kg/m2  SpO2 100%    Objective:   Physical Exam     Constitutional: Appears well-developed and well-nourished. No distress.  Cardiovascular: Normal rate, regular rhythm and normal heart sounds.  Exam reveals no gallop and no friction rub.  No murmur heard. Pulmonary/Chest: Effort normal and breath sounds normal.  No wheezes. No rales.  Skin: Skin is warm and dry.  Psychiatric: Normal mood and affect. Behavior is normal.      Assessment & Plan:

## 2010-06-27 LAB — BASIC METABOLIC PANEL WITH GFR
CO2: 25 mEq/L (ref 19–32)
Calcium: 10.2 mg/dL (ref 8.4–10.5)
Chloride: 103 mEq/L (ref 96–112)
Glucose, Bld: 95 mg/dL (ref 70–99)
Potassium: 4.2 mEq/L (ref 3.5–5.3)
Sodium: 139 mEq/L (ref 135–145)

## 2010-07-01 MED ORDER — ALLOPURINOL 100 MG PO TABS: ORAL_TABLET | ORAL | Status: DC

## 2010-07-01 MED ORDER — PREDNISONE 5 MG PO TABS: 5.0000 mg | ORAL_TABLET | Freq: Every day | ORAL | Status: AC

## 2010-07-09 ENCOUNTER — Other Ambulatory Visit: Payer: Self-pay | Admitting: *Deleted

## 2010-07-09 DIAGNOSIS — M109 Gout, unspecified: Secondary | ICD-10-CM

## 2010-07-09 DIAGNOSIS — I10 Essential (primary) hypertension: Secondary | ICD-10-CM

## 2010-07-09 DIAGNOSIS — E785 Hyperlipidemia, unspecified: Secondary | ICD-10-CM

## 2010-07-24 ENCOUNTER — Other Ambulatory Visit: Payer: Self-pay | Admitting: Internal Medicine

## 2010-07-24 NOTE — Telephone Encounter (Signed)
Call placed to [facility] at [phone]  Spoke with [facility] pharmacy tech, she has verified Rx filled for Losartan and shipped on 07/06/2010. She stated if patient has not received medication he should call [facility].  Call placed to patient at  [phone], no answer.  A detailed voice message was left for patient to return phone call regarding Rx.   [facility], spoke with Jacki Cones regarding rx for Losartan. She was informed rx was filled by [facility] on 5/18 and advised to have patient contact office regarding refill. She has verbalized understanding and agrees. (Of note [facility])

## 2010-07-26 NOTE — Telephone Encounter (Signed)
No return phone call from patient regarding medication refill

## 2010-08-28 ENCOUNTER — Ambulatory Visit: Payer: PRIVATE HEALTH INSURANCE | Admitting: Internal Medicine

## 2010-09-15 NOTE — Progress Notes (Signed)
  Subjective:    Patient ID: Greg Hughes, male    DOB: 28-Aug-1940, 70 y.o.   MRN: [id number]  HPI    Review of Systems     Objective:   Physical Exam        Assessment & Plan:   GOUT, UNSPECIFIED Pt having periodic flares. Flare in March treated with prednisone 04/2010 uric acid level - 8.9  We discussed goal uric acid level of < 6.  We will make decision on whether to use allopurinol based upon uric acid level today.    UNSPECIFIED ESSENTIAL HYPERTENSION Well controlled.  Continue current medication regimen.   Chemistry      Component Value Date/Time   NA 143 04/30/2010 2118   K 4.2 04/30/2010 2118   CL 106 04/30/2010 2118   CO2 25 04/30/2010 2118   BUN 13 04/30/2010 2118   CREATININE 0.94 04/30/2010 2118      Component Value Date/Time   CALCIUM 10.0 04/30/2010 2118   ALKPHOS 79 04/30/2010 2118   AST 20 04/30/2010 2118   ALT 33 04/30/2010 2118   BILITOT 0.5 04/30/2010 2118

## 2010-10-22 ENCOUNTER — Other Ambulatory Visit: Payer: Self-pay | Admitting: Internal Medicine

## 2010-12-04 ENCOUNTER — Other Ambulatory Visit: Payer: Self-pay | Admitting: Internal Medicine

## 2010-12-04 ENCOUNTER — Other Ambulatory Visit: Payer: Self-pay | Admitting: Family Medicine

## 2010-12-06 ENCOUNTER — Encounter: Payer: Self-pay | Admitting: Internal Medicine

## 2010-12-06 ENCOUNTER — Other Ambulatory Visit: Payer: Self-pay | Admitting: *Deleted

## 2010-12-06 ENCOUNTER — Ambulatory Visit (INDEPENDENT_AMBULATORY_CARE_PROVIDER_SITE_OTHER): Payer: PRIVATE HEALTH INSURANCE | Admitting: Internal Medicine

## 2010-12-06 VITALS — BP 130/90 | HR 68 | Temp 97.8°F | Resp 18 | Wt 195.0 lb

## 2010-12-06 DIAGNOSIS — L0233 Carbuncle of buttock: Secondary | ICD-10-CM | POA: Insufficient documentation

## 2010-12-06 MED ORDER — AMOXICILLIN-POT CLAVULANATE 875-125 MG PO TABS: 1.0000 | ORAL_TABLET | Freq: Two times a day (BID) | ORAL | Status: AC

## 2010-12-06 NOTE — Telephone Encounter (Signed)
Patient called stating he was seen today and some of his medications were not filled. He could not verify which medication refills were needed. He was advised to contact the pharmacy and have the refill request sent by the pharmacist. Patient has verbalized understanding and agrees as instructed.

## 2010-12-06 NOTE — Progress Notes (Signed)
  Subjective:    Patient ID: Greg Hughes, male    DOB: [birth year], 70 y.o.   MRN: [id number]  HPI Pt presents to clinic for evaluation of possible skin abscess. Notes 2wk h/o right buttock boil that has sponateously drained pus. Denies fever or chills. No h/o recurrent skin infections. Taking no medication for the problem. No alleviating or exacerbating factors. No other complaints.  Past Medical History  Diagnosis Date  . Hypertension   . GERD (gastroesophageal reflux disease)   . Hyperlipidemia   . History of colon polyps   . BPH (benign prostatic hypertrophy)   . Microscopic hematuria   . ED (erectile dysfunction)   . Tobacco abuse    Past Surgical History  Procedure Date  . Inguinal hernia repair   . Tonsillectomy   . Colonoscopy 2002    reports that he has been smoking.  He has never used smokeless tobacco. He reports that he drinks alcohol. He reports that he does not use illicit drugs. family history includes Coronary artery disease in his father; Diabetes in his father; and Stroke in his father. No Known Allergies     Review of Systems see hpi     Objective:   Physical Exam  Nursing note and vitals reviewed. Constitutional: He appears well-developed and well-nourished. No distress.  HENT:  Head: Normocephalic and atraumatic.  Eyes: Conjunctivae are normal.  Neurological: He is alert.  Skin: Skin is warm and dry. No rash noted. He is not diaphoretic. There is erythema. No pallor.       Right buttock:~3cm raised skin lesion with center opening. +erythema. No associated warmth. +expressible discharge with palpation. No close proximity to rectum. No other lesions.   Psychiatric: He has a normal mood and affect.          Assessment & Plan:

## 2010-12-06 NOTE — Assessment & Plan Note (Signed)
Abscess with spontaneous drainage. ~1cc of purulent discharge expressed. Wound culture obtained. Begin augmentin bid x 7days. Schedule close follow up in 5 days or sooner if needed.

## 2010-12-08 LAB — WOUND CULTURE

## 2010-12-10 ENCOUNTER — Other Ambulatory Visit: Payer: Self-pay | Admitting: *Deleted

## 2010-12-10 ENCOUNTER — Telehealth: Payer: Self-pay | Admitting: Internal Medicine

## 2010-12-10 MED ORDER — SIMVASTATIN 10 MG PO TABS: 10.0000 mg | ORAL_TABLET | Freq: Every day | ORAL | Status: DC

## 2010-12-10 MED ORDER — LOSARTAN POTASSIUM 100 MG PO TABS: 100.0000 mg | ORAL_TABLET | Freq: Every day | ORAL | Status: DC

## 2010-12-10 NOTE — Telephone Encounter (Signed)
error 

## 2010-12-10 NOTE — Telephone Encounter (Signed)
Rx refill sent to pharmacy. 

## 2010-12-10 NOTE — Telephone Encounter (Signed)
Refill- lisinopril 20mg  tablet. Take one tablet by mouth once a day. Qty 90. Last fill 7.23.12

## 2010-12-10 NOTE — Telephone Encounter (Signed)
Refill- simvastatin 10mg  tablet. Take one tablet by mouth at bedtime. Qty 90. Last fill 7.23.12

## 2010-12-11 ENCOUNTER — Ambulatory Visit: Payer: PRIVATE HEALTH INSURANCE | Admitting: Internal Medicine

## 2010-12-12 ENCOUNTER — Telehealth: Payer: Self-pay | Admitting: *Deleted

## 2010-12-12 MED ORDER — CLINDAMYCIN HCL 300 MG PO CAPS: 600.0000 mg | ORAL_CAPSULE | Freq: Three times a day (TID) | ORAL | Status: AC

## 2010-12-12 MED ORDER — LISINOPRIL 20 MG PO TABS: 20.0000 mg | ORAL_TABLET | Freq: Every day | ORAL | Status: DC

## 2010-12-12 NOTE — Telephone Encounter (Signed)
Call returned to patient at [phone], he was informed per Dr Rodena Medin instructions. He has verified that he has been taking both Lisinopril and Losartan. He was advised after speaking with Dr Rodena Medin that the Lisinopril  20 mg would be filled, and if there were any changes he would receive a call back. Patient has verbalized understanding and agrees.

## 2010-12-12 NOTE — Telephone Encounter (Signed)
Call placed to patient at [phone], to advise patient of current Rx for Clindamycin. Patient stated that he did not have any allergies to the medication that he knows of. He stated that he had requested a refill on Lisinopril 20 mg from his pharmacy and it was not refilled. He stated that he had been taking the medication for some time. He was informed requested medication was not on his list of medication, and approval for refill would need to come from the provider.

## 2010-12-12 NOTE — Telephone Encounter (Signed)
We have cozaar listed. Don't usually need both. If not taking cozaar then fill the lisinopril and remove cozaar.

## 2010-12-14 NOTE — Telephone Encounter (Signed)
Call placed to patient at [phone], he was informed after review of chart by Dr Rodena Medin, his lisinopril was changed to Losartan. He was advised to stop taking the lisinopril and continue with Losartan. Patient has verbalized understanding and agrees.  [facility] at [phone], spoke with [facility], she has verified patient has no refills remaining on Lisinopril.

## 2010-12-14 NOTE — Telephone Encounter (Signed)
Chart reviewed. 04/30/10 Dr. Artist Pais wrote "change lisinopril to losartan".

## 2010-12-24 ENCOUNTER — Other Ambulatory Visit: Payer: Self-pay | Admitting: Internal Medicine

## 2010-12-24 NOTE — Telephone Encounter (Signed)
Rx refill sent to pharmacy. 

## 2010-12-25 ENCOUNTER — Ambulatory Visit: Payer: PRIVATE HEALTH INSURANCE | Admitting: Internal Medicine

## 2010-12-26 ENCOUNTER — Encounter: Payer: Self-pay | Admitting: Internal Medicine

## 2010-12-26 ENCOUNTER — Ambulatory Visit (INDEPENDENT_AMBULATORY_CARE_PROVIDER_SITE_OTHER): Payer: PRIVATE HEALTH INSURANCE | Admitting: Internal Medicine

## 2010-12-26 DIAGNOSIS — N139 Obstructive and reflux uropathy, unspecified: Secondary | ICD-10-CM

## 2010-12-26 DIAGNOSIS — N401 Enlarged prostate with lower urinary tract symptoms: Secondary | ICD-10-CM

## 2010-12-26 DIAGNOSIS — N138 Other obstructive and reflux uropathy: Secondary | ICD-10-CM

## 2010-12-26 DIAGNOSIS — E785 Hyperlipidemia, unspecified: Secondary | ICD-10-CM

## 2010-12-26 DIAGNOSIS — Z79899 Other long term (current) drug therapy: Secondary | ICD-10-CM

## 2010-12-26 DIAGNOSIS — I10 Essential (primary) hypertension: Secondary | ICD-10-CM

## 2010-12-26 DIAGNOSIS — L0233 Carbuncle of buttock: Secondary | ICD-10-CM

## 2010-12-26 NOTE — Patient Instructions (Signed)
Please schedule chem7 v58.69, lipid/lft 272.4

## 2010-12-27 LAB — HEPATIC FUNCTION PANEL
Albumin: 4.5 g/dL (ref 3.5–5.2)
Indirect Bilirubin: 0.4 mg/dL (ref 0.0–0.9)
Total Bilirubin: 0.6 mg/dL (ref 0.3–1.2)
Total Protein: 7.8 g/dL (ref 6.0–8.3)

## 2010-12-27 LAB — LIPID PANEL
LDL Cholesterol: 79 mg/dL (ref 0–99)
Triglycerides: 162 mg/dL — ABNORMAL HIGH (ref <150)
VLDL: 32 mg/dL (ref 0–40)

## 2010-12-27 LAB — BASIC METABOLIC PANEL
BUN: 17 mg/dL (ref 6–23)
CO2: 24 mEq/L (ref 19–32)
Chloride: 103 mEq/L (ref 96–112)
Glucose, Bld: 96 mg/dL (ref 70–99)
Potassium: 5.1 mEq/L (ref 3.5–5.3)

## 2010-12-30 NOTE — Assessment & Plan Note (Signed)
Obtain lipid/lft. 

## 2010-12-30 NOTE — Progress Notes (Signed)
  Subjective:    Patient ID: Greg Hughes, male    DOB: 09-23-1940, 70 y.o.   MRN: [id number]  HPI Pt presents to clinic for followup of multiple medical problems. BP reviewed as normotensive. Tolerating statin tx without myalgias or abn lft's. Not taking allopurinol but has no gout attacks. Aware of the need for tobacco cessation. Right buttock abscess resolving. No drainage, fever or chills. Cx + for mrsa and augmentin was changed to clinda. No other complaints.  Past Medical History  Diagnosis Date  . Hypertension   . GERD (gastroesophageal reflux disease)   . Hyperlipidemia   . History of colon polyps   . BPH (benign prostatic hypertrophy)   . Microscopic hematuria   . ED (erectile dysfunction)   . Tobacco abuse    Past Surgical History  Procedure Date  . Inguinal hernia repair   . Tonsillectomy   . Colonoscopy 2002    reports that he has been smoking.  He has never used smokeless tobacco. He reports that he drinks alcohol. He reports that he does not use illicit drugs. family history includes Coronary artery disease in his father; Diabetes in his father; and Stroke in his father. No Known Allergies   Review of Systems see hpi     Objective:   Physical Exam  Nursing note and vitals reviewed. Constitutional: He appears well-developed and well-nourished. No distress.  HENT:  Head: Normocephalic and atraumatic.  Right Ear: External ear normal.  Left Ear: External ear normal.  Nose: Nose normal.  Mouth/Throat: Oropharynx is clear and moist. No oropharyngeal exudate.  Eyes: Conjunctivae are normal. Pupils are equal, round, and reactive to light. Right eye exhibits no discharge. Left eye exhibits no discharge. No scleral icterus.  Neck: Neck supple.  Cardiovascular: Normal rate, regular rhythm and normal heart sounds.  Exam reveals no gallop and no friction rub.   No murmur heard. Pulmonary/Chest: Effort normal and breath sounds normal. No respiratory distress. He has no  wheezes. He has no rales.  Lymphadenopathy:    He has no cervical adenopathy.  Neurological: He is alert.  Skin: Skin is warm and dry. He is not diaphoretic.       Left buttock: wound closed with minimal eschar. No drainage, erythema warmth.  Psychiatric: He has a normal mood and affect.          Assessment & Plan:

## 2010-12-30 NOTE — Assessment & Plan Note (Signed)
Stable. Continue current regimen. Obtain cbc, chem7

## 2010-12-30 NOTE — Assessment & Plan Note (Signed)
resolved 

## 2011-02-19 DIAGNOSIS — I4891 Unspecified atrial fibrillation: Secondary | ICD-10-CM

## 2011-02-19 HISTORY — DX: Unspecified atrial fibrillation: I48.91

## 2011-02-27 ENCOUNTER — Other Ambulatory Visit: Payer: Self-pay | Admitting: Internal Medicine

## 2011-02-28 ENCOUNTER — Other Ambulatory Visit: Payer: Self-pay | Admitting: Internal Medicine

## 2011-02-28 NOTE — Telephone Encounter (Signed)
Rx refill sent to pharmacy. 

## 2011-04-09 ENCOUNTER — Other Ambulatory Visit: Payer: Self-pay | Admitting: Internal Medicine

## 2011-04-09 NOTE — Telephone Encounter (Signed)
Rx refill sent to pharmacy. 

## 2011-04-15 ENCOUNTER — Ambulatory Visit (INDEPENDENT_AMBULATORY_CARE_PROVIDER_SITE_OTHER): Payer: PRIVATE HEALTH INSURANCE | Admitting: Internal Medicine

## 2011-04-15 ENCOUNTER — Encounter: Payer: Self-pay | Admitting: Internal Medicine

## 2011-04-15 ENCOUNTER — Other Ambulatory Visit: Payer: Self-pay | Admitting: Internal Medicine

## 2011-04-15 VITALS — BP 130/72 | HR 73 | Temp 98.2°F | Ht 70.0 in | Wt 195.0 lb

## 2011-04-15 DIAGNOSIS — L0291 Cutaneous abscess, unspecified: Secondary | ICD-10-CM | POA: Insufficient documentation

## 2011-04-15 DIAGNOSIS — L0390 Cellulitis, unspecified: Secondary | ICD-10-CM

## 2011-04-15 MED ORDER — TETRACYCLINE HCL 500 MG PO CAPS: 500.0000 mg | ORAL_CAPSULE | Freq: Four times a day (QID) | ORAL | Status: DC

## 2011-04-15 MED ORDER — CLINDAMYCIN HCL 300 MG PO CAPS: 600.0000 mg | ORAL_CAPSULE | Freq: Three times a day (TID) | ORAL | Status: DC

## 2011-04-15 NOTE — Progress Notes (Signed)
  Subjective:    Patient ID: Greg Hughes, male    DOB: 06/21/1940, 71 y.o.   MRN: [id number]  HPI Pt presents to clinic for evaluation of skin abscess. Notes 2wk h/o left axillary red papules x4. No spontaneous drainage, fever or chills. H/o mrsa abscess 10/12 and wound cx results reviewed. States wife is mrsa ?carrier state receiving abx ointment by nares. No other alleviating or exacerbating factors. No other complaints.  Past Medical History  Diagnosis Date  . Hypertension   . GERD (gastroesophageal reflux disease)   . Hyperlipidemia   . History of colon polyps   . BPH (benign prostatic hypertrophy)   . Microscopic hematuria   . ED (erectile dysfunction)   . Tobacco abuse    Past Surgical History  Procedure Date  . Inguinal hernia repair   . Tonsillectomy   . Colonoscopy 2002    reports that he has been smoking.  He has never used smokeless tobacco. He reports that he drinks alcohol. He reports that he does not use illicit drugs. family history includes Coronary artery disease in his father; Diabetes in his father; and Stroke in his father. No Known Allergies   Review of Systems see hpi     Objective:   Physical Exam  Nursing note and vitals reviewed. Constitutional: He appears well-developed and well-nourished. No distress.  HENT:  Head: Normocephalic and atraumatic.  Eyes: Conjunctivae are normal. No scleral icterus.  Neurological: He is alert.  Skin: Skin is warm and dry. No rash noted. He is not diaphoretic.       Left axilla: 4 erythematous ST masses with the most superior demonstrating small amount of expressible fluid. Areas mildly tender. Diameters ~1cm or less  Psychiatric: He has a normal mood and affect.          Assessment & Plan:

## 2011-04-15 NOTE — Assessment & Plan Note (Signed)
Wound culture obtained from expressible dc. Do not feel areas are currently amenable to I&D. Begin abx tx. Follow up closely if no improvement or worsening. If wound cx +MRSA consider nares topical treatment in future

## 2011-04-16 ENCOUNTER — Encounter: Payer: Self-pay | Admitting: Internal Medicine

## 2011-04-16 ENCOUNTER — Telehealth: Payer: Self-pay | Admitting: *Deleted

## 2011-04-16 NOTE — Telephone Encounter (Signed)
Received call from Tera at [facility] needing verification of site of wound culture. Advised her per office note that culture was taken from pt's left axilla.

## 2011-04-18 LAB — WOUND CULTURE: Gram Stain: NONE SEEN

## 2011-04-19 ENCOUNTER — Other Ambulatory Visit: Payer: Self-pay | Admitting: Internal Medicine

## 2011-04-19 MED ORDER — MUPIROCIN CALCIUM 2 % NA OINT: TOPICAL_OINTMENT | Freq: Two times a day (BID) | NASAL | Status: DC

## 2011-04-23 ENCOUNTER — Telehealth: Payer: Self-pay | Admitting: *Deleted

## 2011-04-23 MED ORDER — CLINDAMYCIN HCL 300 MG PO CAPS: 600.0000 mg | ORAL_CAPSULE | Freq: Three times a day (TID) | ORAL | Status: AC

## 2011-04-23 NOTE — Telephone Encounter (Signed)
Repeat course of clinda. Followup if no improvement or worsening.

## 2011-04-23 NOTE — Telephone Encounter (Signed)
Patient called and left voice message stating he has completed his antibiotic, but does not think his sore under his arm is completely healed. His message stated there is improvement, but would feel better if he took another round of antibiotics.

## 2011-04-23 NOTE — Telephone Encounter (Signed)
Call placed to patient at [phone], no answer. A detailed voice message was left informing patient per Dr Rodena Medin instructions. Patient was advised to follow up if no improvement.

## 2011-06-03 ENCOUNTER — Telehealth: Payer: Self-pay | Admitting: Internal Medicine

## 2011-06-03 MED ORDER — TERAZOSIN HCL 10 MG PO CAPS: 10.0000 mg | ORAL_CAPSULE | Freq: Every day | ORAL | Status: DC

## 2011-06-03 NOTE — Telephone Encounter (Signed)
Refill- terazosin 10mg  capsule. Take one capsule by mouth at bedtime. Qty 90 last fill 1.9.13

## 2011-06-03 NOTE — Telephone Encounter (Signed)
Rx refill sent to pharmacy. 

## 2011-06-25 ENCOUNTER — Ambulatory Visit (INDEPENDENT_AMBULATORY_CARE_PROVIDER_SITE_OTHER): Payer: PRIVATE HEALTH INSURANCE | Admitting: Internal Medicine

## 2011-06-25 ENCOUNTER — Encounter: Payer: Self-pay | Admitting: Internal Medicine

## 2011-06-25 ENCOUNTER — Telehealth: Payer: Self-pay | Admitting: Internal Medicine

## 2011-06-25 VITALS — BP 118/66 | HR 76 | Temp 97.9°F | Resp 16 | Wt 195.0 lb

## 2011-06-25 DIAGNOSIS — E785 Hyperlipidemia, unspecified: Secondary | ICD-10-CM

## 2011-06-25 DIAGNOSIS — M545 Low back pain: Secondary | ICD-10-CM

## 2011-06-25 DIAGNOSIS — Z79899 Other long term (current) drug therapy: Secondary | ICD-10-CM

## 2011-06-25 DIAGNOSIS — J449 Chronic obstructive pulmonary disease, unspecified: Secondary | ICD-10-CM

## 2011-06-25 DIAGNOSIS — I10 Essential (primary) hypertension: Secondary | ICD-10-CM

## 2011-06-25 LAB — HEPATIC FUNCTION PANEL
ALT: 32 U/L (ref 0–53)
AST: 25 U/L (ref 0–37)
Albumin: 4.3 g/dL (ref 3.5–5.2)
Alkaline Phosphatase: 73 U/L (ref 39–117)
Total Protein: 7.4 g/dL (ref 6.0–8.3)

## 2011-06-25 LAB — CBC WITH DIFFERENTIAL/PLATELET
Basophils Absolute: 0 10*3/uL (ref 0.0–0.1)
Basophils Relative: 0 % (ref 0–1)
Eosinophils Absolute: 0.2 10*3/uL (ref 0.0–0.7)
Eosinophils Relative: 1 % (ref 0–5)
Lymphs Abs: 4.1 10*3/uL — ABNORMAL HIGH (ref 0.7–4.0)
MCH: 30.4 pg (ref 26.0–34.0)
Neutrophils Relative %: 58 % (ref 43–77)
Platelets: 212 10*3/uL (ref 150–400)
RBC: 5.39 MIL/uL (ref 4.22–5.81)
RDW: 14.7 % (ref 11.5–15.5)

## 2011-06-25 LAB — LIPID PANEL
Cholesterol: 149 mg/dL (ref 0–200)
HDL: 42 mg/dL (ref >39)
Total CHOL/HDL Ratio: 3.5 Ratio
Triglycerides: 135 mg/dL (ref <150)

## 2011-06-25 LAB — BASIC METABOLIC PANEL
CO2: 26 mEq/L (ref 19–32)
Calcium: 10.1 mg/dL (ref 8.4–10.5)
Creat: 1.03 mg/dL (ref 0.50–1.35)
Sodium: 142 mEq/L (ref 135–145)

## 2011-06-25 MED ORDER — MELOXICAM 7.5 MG PO TABS: ORAL_TABLET | ORAL | Status: AC

## 2011-06-25 MED ORDER — ALBUTEROL SULFATE HFA 108 (90 BASE) MCG/ACT IN AERS: 2.0000 | INHALATION_SPRAY | Freq: Four times a day (QID) | RESPIRATORY_TRACT | Status: DC | PRN

## 2011-06-25 NOTE — Patient Instructions (Signed)
Please schedule fasting labs prior to next visit chem7-v58.69, lipid/lft 272.4 

## 2011-06-25 NOTE — Assessment & Plan Note (Signed)
Begin albuterol mdi prn. Consider daily maintenance medication if sx's occur more frequently.

## 2011-06-25 NOTE — Assessment & Plan Note (Signed)
Chronic intermittent sx's without focal weakness. Stop otc nsaid. Attempt mobic qd prn with food and no other nsaid. If not controlled then consider ls mri.

## 2011-06-25 NOTE — Progress Notes (Signed)
  Subjective:    Patient ID: Greg Hughes, male    DOB: 1940-03-29, 71 y.o.   MRN: [id number]  HPI Pt presents to clinic for followup of multiple medical problems. No further mrsa skin infxns since last treatment. Notes intermittent rare DOE with associated wheezing. H/o nl spirometry. Uses wife's rescue inhaler prn. 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 +feet paresthesias. No leg weakness. Chronic problem with intermittent flares of pain. Last flare ~3wks ago. Uses otc nsaids without gi side effects. Plain xray performed 2011 with mild ddd. S/p PT without improvement.   Past Medical History  Diagnosis Date  . Hypertension   . GERD (gastroesophageal reflux disease)   . Hyperlipidemia   . History of colon polyps   . BPH (benign prostatic hypertrophy)   . Microscopic hematuria   . ED (erectile dysfunction)   . Tobacco abuse    Past Surgical History  Procedure Date  . Inguinal hernia repair   . Tonsillectomy   . Colonoscopy 2002    reports that he has been smoking.  He has never used smokeless tobacco. He reports that he drinks alcohol. He reports that he does not use illicit drugs. family history includes Coronary artery disease in his father; Diabetes in his father; and Stroke in his father. No Known Allergies    Review of Systems see hpi     Objective:   Physical Exam  Physical Exam  Nursing note and vitals reviewed. Constitutional: Appears well-developed and well-nourished. No distress.  HENT:  Head: Normocephalic and atraumatic.  Right Ear: External ear normal.  Left Ear: External ear normal.  Eyes: Conjunctivae are normal. No scleral icterus.  Neck: Neck supple. Carotid bruit is not present.  Cardiovascular: Normal rate, regular rhythm and normal heart sounds.  Exam reveals no gallop and no friction rub.   No murmur heard. Pulmonary/Chest: Effort normal and breath sounds normal. No respiratory distress. He has no wheezes. no rales.    Lymphadenopathy:    He has no cervical adenopathy.  Neurological:Alert.  Skin: Skin is warm and dry. Not diaphoretic.  Psychiatric: Has a normal mood and affect.        Assessment & Plan:

## 2011-06-25 NOTE — Assessment & Plan Note (Signed)
Obtain lipid/lft. 

## 2011-06-25 NOTE — Assessment & Plan Note (Signed)
Normotensive and stable. Continue current regimen. Monitor bp as outpt and followup in clinic as scheduled. Obtain cbc and chem7 

## 2011-06-26 NOTE — Telephone Encounter (Signed)
Future lab order placed and copy sent to lab.

## 2011-07-05 ENCOUNTER — Other Ambulatory Visit: Payer: Self-pay | Admitting: Internal Medicine

## 2011-07-05 DIAGNOSIS — D72829 Elevated white blood cell count, unspecified: Secondary | ICD-10-CM

## 2011-07-19 ENCOUNTER — Other Ambulatory Visit: Payer: Self-pay | Admitting: Internal Medicine

## 2011-07-29 ENCOUNTER — Ambulatory Visit (INDEPENDENT_AMBULATORY_CARE_PROVIDER_SITE_OTHER): Payer: PRIVATE HEALTH INSURANCE | Admitting: Internal Medicine

## 2011-07-29 ENCOUNTER — Encounter: Payer: Self-pay | Admitting: Internal Medicine

## 2011-07-29 VITALS — BP 124/70 | HR 73 | Temp 98.0°F | Resp 20 | Ht 70.0 in | Wt 200.0 lb

## 2011-07-29 DIAGNOSIS — M109 Gout, unspecified: Secondary | ICD-10-CM

## 2011-07-29 MED ORDER — METHYLPREDNISOLONE ACETATE 40 MG/ML IJ SUSP
40.0000 mg | Freq: Once | INTRAMUSCULAR | Status: AC
  Administered 2011-07-29: 40 mg via INTRAMUSCULAR

## 2011-07-29 MED ORDER — COLCHICINE 0.6 MG PO TABS: 0.6000 mg | ORAL_TABLET | Freq: Two times a day (BID) | ORAL | Status: DC | PRN

## 2011-08-04 NOTE — Progress Notes (Signed)
  Subjective:    Patient ID: Greg Hughes, male    DOB: Aug 10, 1940, 71 y.o.   MRN: [id number]  HPI Pt presents to clinic for evaluation of left hand swelling. Duration several days without injury/trauama. Has 2/10 pain and is tender to touch. Has FROM. +h/o gout. No other alleviating or exacerbating factors.  Past Medical History  Diagnosis Date  . Hypertension   . GERD (gastroesophageal reflux disease)   . Hyperlipidemia   . History of colon polyps   . BPH (benign prostatic hypertrophy)   . Microscopic hematuria   . ED (erectile dysfunction)   . Tobacco abuse    Past Surgical History  Procedure Date  . Inguinal hernia repair   . Tonsillectomy   . Colonoscopy 2002    reports that he has been smoking.  He has never used smokeless tobacco. He reports that he drinks alcohol. He reports that he does not use illicit drugs. family history includes Coronary artery disease in his father; Diabetes in his father; and Stroke in his father. No Known Allergies   Review of Systems see hpi     Objective:   Physical Exam  Nursing note and vitals reviewed. Constitutional: He appears well-developed and well-nourished. No distress.  Musculoskeletal:       Left hand-soft tissue swelling with associated warmth. No wound/break in skin. FROM. +2 radial pulse.  Neurological: He is alert.  Skin: Skin is warm and dry. No rash noted. He is not diaphoretic. No erythema.  Psychiatric: He has a normal mood and affect.          Assessment & Plan:

## 2011-08-04 NOTE — Assessment & Plan Note (Signed)
Given depomedrol im injxn. Begin colchicine prn. Followup if no improvement or worsening.

## 2011-08-30 ENCOUNTER — Other Ambulatory Visit: Payer: Self-pay | Admitting: Internal Medicine

## 2011-08-30 NOTE — Telephone Encounter (Signed)
Refill on Z[facility]or to [facility]

## 2011-10-23 ENCOUNTER — Other Ambulatory Visit: Payer: Self-pay | Admitting: Internal Medicine

## 2011-10-23 NOTE — Telephone Encounter (Signed)
Rx done/SLS 

## 2011-11-08 ENCOUNTER — Observation Stay: Payer: Self-pay | Admitting: Internal Medicine

## 2011-11-08 DIAGNOSIS — I4891 Unspecified atrial fibrillation: Secondary | ICD-10-CM

## 2011-11-08 DIAGNOSIS — I359 Nonrheumatic aortic valve disorder, unspecified: Secondary | ICD-10-CM

## 2011-11-08 LAB — URINALYSIS, COMPLETE
Bilirubin,UR: NEGATIVE
Ketone: NEGATIVE
Leukocyte Esterase: NEGATIVE
Nitrite: NEGATIVE
Ph: 8 (ref 4.5–8.0)
Protein: NEGATIVE
RBC,UR: NONE SEEN /HPF (ref 0–5)
Specific Gravity: 1.004 (ref 1.003–1.030)
WBC UR: <1 /HPF (ref 0–5)

## 2011-11-08 LAB — CK TOTAL AND CKMB (NOT AT ARMC)
CK, Total: 110 U/L (ref 35–232)
CK-MB: 2 ng/mL (ref 0.5–3.6)
CK-MB: 1.3 ng/mL (ref 0.5–3.6)

## 2011-11-08 LAB — HEPATIC FUNCTION PANEL A (ARMC)
Alkaline Phosphatase: 88 U/L (ref 50–136)
Bilirubin, Direct: 0.1 mg/dL (ref 0.00–0.20)
Bilirubin,Total: 0.5 mg/dL (ref 0.2–1.0)
Total Protein: 7.3 g/dL (ref 6.4–8.2)

## 2011-11-08 LAB — CBC
HCT: 45.2 % (ref 40.0–52.0)
HGB: 15.6 g/dL (ref 13.0–18.0)
MCH: 31.1 pg (ref 26.0–34.0)
MCV: 90 fL (ref 80–100)
Platelet: 203 10*3/uL (ref 150–440)
RDW: 15 % — ABNORMAL HIGH (ref 11.5–14.5)
WBC: 13.4 10*3/uL — ABNORMAL HIGH (ref 3.8–10.6)

## 2011-11-08 LAB — TROPONIN I: Troponin-I: 0.04 ng/mL

## 2011-11-08 LAB — BASIC METABOLIC PANEL
BUN: 18 mg/dL (ref 7–18)
EGFR (African American): >60
EGFR (Non-African Amer.): >60
Glucose: 169 mg/dL — ABNORMAL HIGH (ref 65–99)
Osmolality: 289 (ref 275–301)
Potassium: 3.3 mmol/L — ABNORMAL LOW (ref 3.5–5.1)
Sodium: 142 mmol/L (ref 136–145)

## 2011-11-09 LAB — BASIC METABOLIC PANEL
Anion Gap: 9 (ref 7–16)
BUN: 16 mg/dL (ref 7–18)
Calcium, Total: 8.7 mg/dL (ref 8.5–10.1)
Chloride: 109 mmol/L — ABNORMAL HIGH (ref 98–107)
Co2: 26 mmol/L (ref 21–32)
EGFR (Non-African Amer.): >60
Osmolality: 288 (ref 275–301)
Potassium: 4 mmol/L (ref 3.5–5.1)

## 2011-11-09 LAB — CBC WITH DIFFERENTIAL/PLATELET
Basophil #: 0.1 10*3/uL (ref 0.0–0.1)
Basophil %: 0.5 %
Eosinophil #: 0.1 10*3/uL (ref 0.0–0.7)
Eosinophil %: 1 %
HCT: 40.7 % (ref 40.0–52.0)
Lymphocyte #: 4.6 10*3/uL — ABNORMAL HIGH (ref 1.0–3.6)
Lymphocyte %: 39.4 %
MCH: 30.8 pg (ref 26.0–34.0)
MCV: 90 fL (ref 80–100)
Monocyte #: 0.7 x10 3/mm (ref 0.2–1.0)
Neutrophil #: 6.2 10*3/uL (ref 1.4–6.5)
Neutrophil %: 52.9 %
RBC: 4.51 10*6/uL (ref 4.40–5.90)
RDW: 15 % — ABNORMAL HIGH (ref 11.5–14.5)
WBC: 11.8 10*3/uL — ABNORMAL HIGH (ref 3.8–10.6)

## 2011-11-09 LAB — CK TOTAL AND CKMB (NOT AT ARMC)
CK, Total: 78 U/L (ref 35–232)
CK-MB: 0.8 ng/mL (ref 0.5–3.6)

## 2011-11-09 LAB — HEMOGLOBIN A1C: Hemoglobin A1C: 5.9 % (ref 4.2–6.3)

## 2011-11-11 LAB — LIPID PANEL
HDL Cholesterol: 40 mg/dL (ref 40–60)
Ldl Cholesterol, Calc: 45 mg/dL (ref 0–100)
VLDL Cholesterol, Calc: 31 mg/dL (ref 5–40)

## 2011-11-13 ENCOUNTER — Encounter: Payer: Self-pay | Admitting: *Deleted

## 2011-11-14 ENCOUNTER — Encounter: Payer: PRIVATE HEALTH INSURANCE | Admitting: Cardiovascular Disease

## 2011-11-18 ENCOUNTER — Ambulatory Visit (INDEPENDENT_AMBULATORY_CARE_PROVIDER_SITE_OTHER): Payer: PRIVATE HEALTH INSURANCE | Admitting: Cardiovascular Disease

## 2011-11-18 ENCOUNTER — Encounter: Payer: Self-pay | Admitting: Cardiovascular Disease

## 2011-11-18 VITALS — BP 146/72 | HR 72 | Ht 70.0 in | Wt 192.2 lb

## 2011-11-18 DIAGNOSIS — I10 Essential (primary) hypertension: Secondary | ICD-10-CM

## 2011-11-18 DIAGNOSIS — I4891 Unspecified atrial fibrillation: Secondary | ICD-10-CM | POA: Insufficient documentation

## 2011-11-18 DIAGNOSIS — E785 Hyperlipidemia, unspecified: Secondary | ICD-10-CM

## 2011-11-18 DIAGNOSIS — I480 Paroxysmal atrial fibrillation: Secondary | ICD-10-CM | POA: Insufficient documentation

## 2011-11-18 DIAGNOSIS — F17200 Nicotine dependence, unspecified, uncomplicated: Secondary | ICD-10-CM

## 2011-11-18 MED ORDER — DILTIAZEM HCL ER COATED BEADS 180 MG PO CP24: 180.0000 mg | ORAL_CAPSULE | Freq: Every day | ORAL | Status: DC

## 2011-11-18 MED ORDER — METOPROLOL TARTRATE 25 MG PO TABS: 25.0000 mg | ORAL_TABLET | Freq: Two times a day (BID) | ORAL | Status: DC | PRN

## 2011-11-18 NOTE — Assessment & Plan Note (Signed)
He is maintaining normal sinus rhythm. Blood pressure is mildly elevated. Will increase diltiazem to 180 mg daily. We have suggested he take metoprolol for any episodes of palpitations concerning for atrial fibrillation.

## 2011-11-18 NOTE — Progress Notes (Signed)
Patient ID: AKXEL THIAM, male    DOB: 04-Feb-1941, 71 y.o.   MRN: [id number]  HPI Comments: Mr. Distasi is a very pleasant 71 year old gentleman with a long history of smoking who continues to smoke one pack per day, hypertension who presented to the hospital, [facility] 2 weeks ago with palpitations and shortness of breath. He was found to be in atrial fibrillation with RVR, rate 150 beats per minute. He was given IV Cardizem in the emergency room and she converted to normal sinus rhythm during his hospital course. He was discharged on Cardizem 120 mg daily.  He presents today and reports that he feels he has had atrial fibrillation for some time typically in the mornings. His blood pressure cuff will read error. A pressure at home has been elevated with heart rate is typically in the 80s. Occasional dizziness on the new medication.  Echocardiogram 11/08/2011 shows normal systolic function, normal left atrial size, otherwise normal study  He has had significant problems with his back, 3 cortisone shots with no improvement of his symptoms  Total cholesterol 116, LDL 45, HDL 40 EKG shows normal sinus rhythm with rate 72 beats per minute, no significant ST or T wave changes   Outpatient Encounter Prescriptions as of 11/18/2011  Medication Sig Dispense Refill  . albuterol (PROVENTIL HFA;VENTOLIN HFA) 108 (90 BASE) MCG/ACT inhaler Inhale 2 puffs into the lungs every 6 (six) hours as needed for wheezing.  1 Inhaler  11  . aspirin 81 MG tablet Take 81 mg by mouth daily.        . colchicine 0.6 MG tablet Take 1 tablet (0.6 mg total) by mouth 2 (two) times daily as needed.  30 tablet  3  . finasteride (PROSCAR) 5 MG tablet TAKE 1 TABLET BY MOUTH EVERY DAY  90 tablet  1  . simvastatin (ZOCOR) 10 MG tablet TAKE 1 TABLET BY MOUTH AT BEDTIME  90 tablet  3  . terazosin (HYTRIN) 10 MG capsule Take 1 capsule (10 mg total) by mouth at bedtime.  90 capsule  1  . traMADol (ULTRAM) 50 MG tablet Take 50 mg by mouth  every 6 (six) hours as needed.      .  diltiazem (CARDIZEM) 120 MG tablet Take 120 mg by mouth daily.      . metoprolol tartrate (LOPRESSOR) 25 MG tablet Take 1 tablet (25 mg total) by mouth 2 (two) times daily as needed.  60 tablet  6  . mupirocin nasal ointment (BACTROBAN NASAL) 2 % Place into the nose 2 (two) times daily. After application, press sides of nose together and gently massage.  10 g  0     Review of Systems  Constitutional: Negative.   HENT: Negative.   Eyes: Negative.   Respiratory: Negative.   Cardiovascular: Positive for palpitations.  Gastrointestinal: Negative.   Musculoskeletal: Negative.   Skin: Negative.   Neurological: Negative.   Hematological: Negative.   Psychiatric/Behavioral: Negative.   All other systems reviewed and are negative.    BP 146/72  Pulse 72  Ht 5\' 10"  (1.778 m)  Wt 192 lb 4 oz (87.204 kg)  BMI 27.58 kg/m2  Physical Exam  Nursing note and vitals reviewed. Constitutional: He is oriented to person, place, and time. He appears well-developed and well-nourished.  HENT:  Head: Normocephalic.  Nose: Nose normal.  Mouth/Throat: Oropharynx is clear and moist.  Eyes: Conjunctivae normal are normal. Pupils are equal, round, and reactive to light.  Neck: Normal range of motion.  Neck supple. No JVD present.  Cardiovascular: Normal rate, regular rhythm, S1 normal, S2 normal, normal heart sounds and intact distal pulses.  Exam reveals no gallop and no friction rub.   No murmur heard. Pulmonary/Chest: Effort normal. No respiratory distress. He has no decreased breath sounds. He has no wheezes. He has no rales. He exhibits no tenderness.  Abdominal: Soft. Bowel sounds are normal. He exhibits no distension. There is no tenderness.  Musculoskeletal: Normal range of motion. He exhibits no edema and no tenderness.  Lymphadenopathy:    He has no cervical adenopathy.  Neurological: He is alert and oriented to person, place, and time. Coordination  normal.  Skin: Skin is warm and dry. No rash noted. No erythema.  Psychiatric: He has a normal mood and affect. His behavior is normal. Judgment and thought content normal.           Assessment and Plan

## 2011-11-18 NOTE — Assessment & Plan Note (Signed)
We will start a higher dose of diltiazem 180 mg daily and has suggested he closely monitor his blood pressure.

## 2011-11-18 NOTE — Assessment & Plan Note (Signed)
We have encouraged him to continue to work on weaning his cigarettes and smoking cessation. He will continue to work on this and does not want any assistance with chantix.  

## 2011-11-18 NOTE — Patient Instructions (Addendum)
You are doing well. Please increase the diltiazem/cardizem to 180 mg once a day Take metoprolol one pill as needed for high blood pressure and palpitations/tachycardia  Please call us if you have new issues that need to be addressed before your next appt.  Your physician wants you to follow-up in: 3 months.  You will receive a reminder letter in the mail two months in advance. If you don't receive a letter, please call our office to schedule the follow-up appointment.

## 2011-11-18 NOTE — Assessment & Plan Note (Signed)
Cholesterol is at goal on the current lipid regimen. No changes to the medications were made.  

## 2011-11-26 ENCOUNTER — Ambulatory Visit (INDEPENDENT_AMBULATORY_CARE_PROVIDER_SITE_OTHER): Payer: PRIVATE HEALTH INSURANCE | Admitting: Internal Medicine

## 2011-11-26 VITALS — BP 130/68

## 2011-11-26 DIAGNOSIS — R739 Hyperglycemia, unspecified: Secondary | ICD-10-CM

## 2011-11-26 DIAGNOSIS — R7309 Other abnormal glucose: Secondary | ICD-10-CM

## 2011-11-26 DIAGNOSIS — E876 Hypokalemia: Secondary | ICD-10-CM

## 2011-11-26 DIAGNOSIS — Z23 Encounter for immunization: Secondary | ICD-10-CM

## 2011-11-26 LAB — BASIC METABOLIC PANEL
BUN: 18 mg/dL (ref 6–23)
Calcium: 9.9 mg/dL (ref 8.4–10.5)
Creat: 1 mg/dL (ref 0.50–1.35)
Potassium: 4.6 mEq/L (ref 3.5–5.3)
Sodium: 142 mEq/L (ref 135–145)

## 2011-11-26 LAB — HEMOGLOBIN A1C
Hgb A1c MFr Bld: 5.7 % — ABNORMAL HIGH (ref <5.7)
Mean Plasma Glucose: 117 mg/dL — ABNORMAL HIGH (ref <117)

## 2011-11-27 ENCOUNTER — Encounter: Payer: Self-pay | Admitting: Internal Medicine

## 2011-11-27 ENCOUNTER — Telehealth: Payer: Self-pay

## 2011-11-27 DIAGNOSIS — E876 Hypokalemia: Secondary | ICD-10-CM | POA: Insufficient documentation

## 2011-11-27 DIAGNOSIS — R739 Hyperglycemia, unspecified: Secondary | ICD-10-CM | POA: Insufficient documentation

## 2011-11-27 NOTE — Telephone Encounter (Signed)
Would take diltiazem and metoprolol before bed Also take metoprolol in the morning Need blood pressure and heart rate If room on blood pressure, we could increase the medications Probably having atrial fibrillation in the morning

## 2011-11-27 NOTE — Telephone Encounter (Signed)
Pt informed.  Understanding verb. Says he monitors BP and HR at home.  He will try keeping a log of these and will call us with info.

## 2011-11-27 NOTE — Telephone Encounter (Signed)
Pt says he takes diltiazem 180 mg qd with metoprolol PRN.  He says he is now having to take the metoprolol daily instead of just PRN. Says palpitations are worse in am and are associated with sob. Once he takes the diltiazem and metoprolol q am, symptoms resolve and and pt's rhythm is normal.   I offered to bring him in for EKG to make sure he has not converted to NSR.  He states, "I think my rhythm is fine" I told him I would discuss with Dr. Mariah Milling to see if any med changes need to be made. Understanding verb.

## 2011-11-27 NOTE — Telephone Encounter (Signed)
Patient is having shortness of breath, dizziness, arrhythmia with having to take the metoprolol on a daily basis instead of as needed.  He has noticed the symptoms for about one month.  His blood pressure today is 135/69 with HR 69.  He was told by his family physician yesterday to follow up with the cardiologist office today to see if needs to be evaluated. Please call ASAP!

## 2011-11-27 NOTE — Progress Notes (Signed)
  Subjective:    Patient ID: Greg Hughes, male    DOB: 01/06/1941, 71 y.o.   MRN: [id number]  HPI Pt presents to clinic for followup of multiple medical problems. Blood pressure reviewed as normotensive. Status post hospitalization for atrial fibrillation with a rapid ventricular rate. Followed by cardiology. As beta blocker to be taken on an as-needed basis. Does note intermittent palpitations and is contacting cardiology regarding this. Reviewed hospital lab Chem-7 with glucose of one hundred and sixty-nine and potassium of 3.3. Unclear whether this is fasting but thought to be unlikely   Past Medical History  Diagnosis Date  . Hypertension   . GERD (gastroesophageal reflux disease)   . Hyperlipidemia   . History of colon polyps   . BPH (benign prostatic hypertrophy)   . Microscopic hematuria   . ED (erectile dysfunction)   . Tobacco abuse   . A-fib 2013   Past Surgical History  Procedure Date  . Inguinal hernia repair   . Tonsillectomy   . Colonoscopy 2002    reports that he has been smoking Cigarettes.  He has been smoking about 1 pack per day for the past 0 years. He has never used smokeless tobacco. He reports that he drinks alcohol. He reports that he does not use illicit drugs. family history includes Coronary artery disease in his father; Diabetes in his father; and Stroke in his father. No Known Allergies   Review of Systems see hpi     Objective:   Physical Exam  Physical Exam  Nursing note and vitals reviewed. Constitutional: Appears well-developed and well-nourished. No distress.  HENT:  Head: Normocephalic and atraumatic.  Right Ear: External ear normal.  Left Ear: External ear normal.  Eyes: Conjunctivae are normal. No scleral icterus.  Neck: Neck supple. Carotid bruit is not present.  Cardiovascular: Normal rate, regular rhythm and normal heart sounds.  Exam reveals no gallop and no friction rub.   No murmur heard. Pulmonary/Chest: Effort normal and  breath sounds normal. No respiratory distress. He has no wheezes. no rales.  Lymphadenopathy:    He has no cervical adenopathy.  Neurological:Alert.  Skin: Skin is warm and dry. Not diaphoretic.  Psychiatric: Has a normal mood and affect.        Assessment & Plan:

## 2011-11-27 NOTE — Assessment & Plan Note (Signed)
Obtain Chem-7 

## 2011-11-27 NOTE — Assessment & Plan Note (Signed)
Obtain Chem-7 and A1c 

## 2011-11-27 NOTE — Telephone Encounter (Signed)
Please see below and advise. Thanks

## 2011-11-29 ENCOUNTER — Other Ambulatory Visit: Payer: Self-pay | Admitting: Internal Medicine

## 2011-12-11 ENCOUNTER — Telehealth: Payer: Self-pay

## 2011-12-11 ENCOUNTER — Encounter: Payer: Self-pay | Admitting: Gastroenterology

## 2011-12-11 NOTE — Telephone Encounter (Signed)
Would continue to monitor as he is doing and as it is somewhat labile, better recently

## 2011-12-11 NOTE — Telephone Encounter (Signed)
Pt submitted BP readings he is getting from his home: 142/65, 155/67, 167/62, 118/58, 129/62, 130/66, avg HR=62BPM  (Full print out on your desk)

## 2011-12-12 NOTE — Telephone Encounter (Signed)
Please see what I told pt

## 2011-12-12 NOTE — Telephone Encounter (Signed)
Pt informed. Understanding verb He wants to know if ok to take the PRN metoprolol BID. Says he has been needing this at hs d/t higher BP at this time. I advised ok to try BID metoprolol, but advised to monitor HRs closely since this med may drop HR  Understanding verb.

## 2011-12-13 ENCOUNTER — Telehealth: Payer: Self-pay | Admitting: *Deleted

## 2011-12-13 NOTE — Telephone Encounter (Signed)
Pt is aware blood pressure monitor results were reviewed by Dr. Mariah Milling. Recommended he continue metoprolol bid. Pt agrees with plan. Averages of blood pressure given to pt. Mylo Red RN

## 2012-02-26 ENCOUNTER — Ambulatory Visit (INDEPENDENT_AMBULATORY_CARE_PROVIDER_SITE_OTHER): Payer: Medicare Other | Admitting: Cardiovascular Disease

## 2012-02-26 ENCOUNTER — Encounter: Payer: Self-pay | Admitting: Cardiovascular Disease

## 2012-02-26 VITALS — BP 110/60 | HR 62 | Ht 70.0 in | Wt 195.5 lb

## 2012-02-26 DIAGNOSIS — E785 Hyperlipidemia, unspecified: Secondary | ICD-10-CM

## 2012-02-26 DIAGNOSIS — I10 Essential (primary) hypertension: Secondary | ICD-10-CM

## 2012-02-26 DIAGNOSIS — I4891 Unspecified atrial fibrillation: Secondary | ICD-10-CM

## 2012-02-26 DIAGNOSIS — F17200 Nicotine dependence, unspecified, uncomplicated: Secondary | ICD-10-CM

## 2012-02-26 DIAGNOSIS — J449 Chronic obstructive pulmonary disease, unspecified: Secondary | ICD-10-CM

## 2012-02-26 MED ORDER — DILTIAZEM HCL ER COATED BEADS 180 MG PO CP24: 180.0000 mg | ORAL_CAPSULE | Freq: Every day | ORAL | Status: DC

## 2012-02-26 MED ORDER — METOPROLOL TARTRATE 25 MG PO TABS: 25.0000 mg | ORAL_TABLET | Freq: Two times a day (BID) | ORAL | Status: DC

## 2012-02-26 MED ORDER — SIMVASTATIN 10 MG PO TABS: 10.0000 mg | ORAL_TABLET | Freq: Every day | ORAL | Status: DC

## 2012-02-26 NOTE — Assessment & Plan Note (Signed)
He reports that he continues to smoke. Denies significant shortness of breath.

## 2012-02-26 NOTE — Assessment & Plan Note (Signed)
No significant breakthrough arrhythmia per the patient. We have suggested he take his diltiazem in the morning and metoprolol twice a day. If he has fatigue, he can take metoprolol only in the evening with extra metoprolol as needed for breakthrough atrial fibrillation.

## 2012-02-26 NOTE — Assessment & Plan Note (Signed)
Cholesterol is at goal on the current lipid regimen. No changes to the medications were made.  

## 2012-02-26 NOTE — Progress Notes (Signed)
Patient ID: Greg Hughes, male    DOB: 1941-02-08, 72 y.o.   MRN: [id number]  HPI Comments: Greg Hughes is a very pleasant 72 year old gentleman with a long history of smoking who continues to smoke one pack per day, hypertension who presented to the hospital, [facility] 2 weeks ago with palpitations and shortness of breath. He was found to be in atrial fibrillation with RVR, rate 150 beats per minute. He was given IV Cardizem in the emergency room and she converted to normal sinus rhythm during his hospital course. He was discharged on Cardizem 120 mg daily.  He has been taking Cardizem 180 mg daily at nighttime, metoprolol 25 mg twice a day. He reports his blood pressure has been elevated in the late afternoon into the evening with slightly higher heart rate into the 80s at that time. Overall he feels well with no significant episodes of breakthrough atrial fibrillation. He does have some fatigue. He has had significant problems with his back, 3 cortisone shots with no improvement of his symptoms.  Echocardiogram 11/08/2011 shows normal systolic function, normal left atrial size, otherwise normal study  Total cholesterol 116, LDL 45, HDL 40 EKG shows normal sinus rhythm with rate 62 beats per minute, no significant ST or T wave changes   Outpatient Encounter Prescriptions as of 02/26/2012  Medication Sig Dispense Refill  . aspirin 81 MG tablet Take 81 mg by mouth daily.        . colchicine 0.6 MG tablet Take 1 tablet (0.6 mg total) by mouth 2 (two) times daily as needed.  30 tablet  3  . diltiazem (CARDIZEM CD) 180 MG 24 hr capsule Take 1 capsule (180 mg total) by mouth daily.  90 capsule  4  . finasteride (PROSCAR) 5 MG tablet TAKE 1 TABLET BY MOUTH EVERY DAY  90 tablet  1  . metoprolol tartrate (LOPRESSOR) 25 MG tablet Take 1 tablet (25 mg total) by mouth 2 (two) times daily.  180 tablet  4  . simvastatin (ZOCOR) 10 MG tablet Take 1 tablet (10 mg total) by mouth at bedtime.  90 tablet  3  .  terazosin (HYTRIN) 10 MG capsule TAKE ONE CAPSULE BY MOUTH AT BEDTIME  90 capsule  1  . traMADol (ULTRAM) 50 MG tablet Take 50 mg by mouth every 6 (six) hours as needed.       A Review of Systems  Constitutional: Positive for fatigue.  HENT: Negative.   Eyes: Negative.   Respiratory: Negative.   Gastrointestinal: Negative.   Musculoskeletal: Negative.   Skin: Negative.   Neurological: Negative.   Hematological: Negative.   Psychiatric/Behavioral: Negative.   All other systems reviewed and are negative.    BP 110/60  Pulse 62  Ht 5\' 10"  (1.778 m)  Wt 195 lb 8 oz (88.678 kg)  BMI 28.05 kg/m2  Physical Exam  Nursing note and vitals reviewed. Constitutional: He is oriented to person, place, and time. He appears well-developed and well-nourished.  HENT:  Head: Normocephalic.  Nose: Nose normal.  Mouth/Throat: Oropharynx is clear and moist.  Eyes: Conjunctivae normal are normal. Pupils are equal, round, and reactive to light.  Neck: Normal range of motion. Neck supple. No JVD present.  Cardiovascular: Normal rate, regular rhythm, S1 normal, S2 normal, normal heart sounds and intact distal pulses.  Exam reveals no gallop and no friction rub.   No murmur heard. Pulmonary/Chest: Effort normal and breath sounds normal. No respiratory distress. He has no decreased breath sounds. He  has no wheezes. He has no rales. He exhibits no tenderness.  Abdominal: Soft. Bowel sounds are normal. He exhibits no distension. There is no tenderness.  Musculoskeletal: Normal range of motion. He exhibits no edema and no tenderness.  Lymphadenopathy:    He has no cervical adenopathy.  Neurological: He is alert and oriented to person, place, and time. Coordination normal.  Skin: Skin is warm and dry. No rash noted. No erythema.  Psychiatric: He has a normal mood and affect. His behavior is normal. Judgment and thought content normal.           Assessment and Plan

## 2012-02-26 NOTE — Assessment & Plan Note (Signed)
Blood pressure was higher in the afternoon. We'll change the diltiazem from 10 PM to morning.

## 2012-02-26 NOTE — Assessment & Plan Note (Signed)
We have encouraged him to continue to work on weaning his cigarettes and smoking cessation. He will continue to work on this and does not want any assistance with chantix.  

## 2012-02-26 NOTE — Patient Instructions (Addendum)
You are doing well. Try taking diltiazem in the morning, metoprolol at night, Take extra  Metoprolol as needed for rhythm control, you might need a morning metoprolol If you have worsening fatigue, cut the metoprolol in 1/2  Please call us if you have new issues that need to be addressed before your next appt.  Your physician wants you to follow-up in: 6 months.  You will receive a reminder letter in the mail two months in advance. If you don't receive a letter, please call our office to schedule the follow-up appointment.

## 2012-02-27 ENCOUNTER — Ambulatory Visit (INDEPENDENT_AMBULATORY_CARE_PROVIDER_SITE_OTHER): Payer: Medicare Other | Admitting: Internal Medicine

## 2012-02-27 ENCOUNTER — Encounter: Payer: Self-pay | Admitting: Internal Medicine

## 2012-02-27 VITALS — BP 112/68 | HR 61 | Temp 98.3°F | Resp 16 | Wt 188.8 lb

## 2012-02-27 DIAGNOSIS — Z125 Encounter for screening for malignant neoplasm of prostate: Secondary | ICD-10-CM

## 2012-02-27 DIAGNOSIS — M545 Low back pain: Secondary | ICD-10-CM

## 2012-02-27 DIAGNOSIS — IMO0002 Reserved for concepts with insufficient information to code with codable children: Secondary | ICD-10-CM

## 2012-02-27 DIAGNOSIS — M5416 Radiculopathy, lumbar region: Secondary | ICD-10-CM

## 2012-02-27 DIAGNOSIS — E785 Hyperlipidemia, unspecified: Secondary | ICD-10-CM

## 2012-02-27 DIAGNOSIS — M109 Gout, unspecified: Secondary | ICD-10-CM

## 2012-02-27 LAB — PSA, MEDICARE: PSA: 2.54 ng/mL (ref <=4.00)

## 2012-02-27 LAB — URIC ACID: Uric Acid, Serum: 9 mg/dL — ABNORMAL HIGH (ref 4.0–7.8)

## 2012-02-27 LAB — HEPATIC FUNCTION PANEL
ALT: 30 U/L (ref 0–53)
AST: 20 U/L (ref 0–37)
Bilirubin, Direct: 0.1 mg/dL (ref 0.0–0.3)
Indirect Bilirubin: 0.5 mg/dL (ref 0.0–0.9)
Total Bilirubin: 0.6 mg/dL (ref 0.3–1.2)

## 2012-02-27 LAB — LIPID PANEL
Cholesterol: 140 mg/dL (ref 0–200)
Total CHOL/HDL Ratio: 3.3 Ratio

## 2012-03-01 NOTE — Assessment & Plan Note (Signed)
Failing conservative care. Proceed with surgery consult

## 2012-03-01 NOTE — Progress Notes (Signed)
  Subjective:    Patient ID: Greg Hughes, male    DOB: 01-17-1941, 72 y.o.   MRN: [id number]  HPI Pt presents to clinic for followup of multiple medical problems. Continues with chronic back pain s/p epidural injection. Taking ultram ~ qd for pain. No focal leg weakness. BP reviewed as normotensive.   Past Medical History  Diagnosis Date  . Hypertension   . GERD (gastroesophageal reflux disease)   . Hyperlipidemia   . History of colon polyps   . BPH (benign prostatic hypertrophy)   . Microscopic hematuria   . ED (erectile dysfunction)   . Tobacco abuse   . A-fib 2013   Past Surgical History  Procedure Date  . Inguinal hernia repair   . Tonsillectomy   . Colonoscopy 2002    reports that he has been smoking Cigarettes.  He has been smoking about 1 pack per day for the past 0 years. He has never used smokeless tobacco. He reports that he drinks alcohol. He reports that he does not use illicit drugs. family history includes Coronary artery disease in his father; Diabetes in his father; and Stroke in his father. No Known Allergies    Review of Systems see hpi     Objective:   Physical Exam  Physical Exam  Nursing note and vitals reviewed. Constitutional: Appears well-developed and well-nourished. No distress.  HENT:  Head: Normocephalic and atraumatic.  Right Ear: External ear normal.  Left Ear: External ear normal.  Eyes: Conjunctivae are normal. No scleral icterus.  Neck: Neck supple. Carotid bruit is not present.  Cardiovascular: Normal rate, regular rhythm and normal heart sounds.  Exam reveals no gallop and no friction rub.   No murmur heard. Pulmonary/Chest: Effort normal and breath sounds normal. No respiratory distress. He has no wheezes. no rales.  Lymphadenopathy:    He has no cervical adenopathy.  Neurological:Alert.  Skin: Skin is warm and dry. Not diaphoretic.  Psychiatric: Has a normal mood and affect.        Assessment & Plan:

## 2012-03-01 NOTE — Assessment & Plan Note (Signed)
Obtain uric acid level

## 2012-03-01 NOTE — Assessment & Plan Note (Signed)
Obtain lipid/lft. 

## 2012-03-02 ENCOUNTER — Telehealth: Payer: Self-pay | Admitting: *Deleted

## 2012-03-02 DIAGNOSIS — M109 Gout, unspecified: Secondary | ICD-10-CM

## 2012-03-02 MED ORDER — ALLOPURINOL 300 MG PO TABS: 300.0000 mg | ORAL_TABLET | Freq: Every day | ORAL | Status: DC

## 2012-03-02 NOTE — Telephone Encounter (Signed)
Patient informed, understood & agreed; New Rx to pharmacy, future lab order placed/SLS

## 2012-03-02 NOTE — Telephone Encounter (Signed)
Message copied by Regis Bill on Mon Mar 02, 2012  3:04 PM ------      Message from: Edwyna Perfect      Created: Sun Mar 01, 2012  3:59 PM       Uric acid high. Recommend allopurinol 300mg  qd. Recheck uric acid level (gout) with next labs

## 2012-03-25 ENCOUNTER — Other Ambulatory Visit: Payer: Self-pay | Admitting: Internal Medicine

## 2012-05-19 ENCOUNTER — Telehealth: Payer: Self-pay | Admitting: Internal Medicine

## 2012-05-19 NOTE — Telephone Encounter (Signed)
Pt called he is pt of dr hodgin in [place] and wanted to know if he could transfer to dr walker, because dr Rodena Medin is out on medical leave.  i explained to pt that dr walker new pt appointment would be July and wanted to know if he had to wait that long even if he was  [facility] pt.

## 2012-05-21 NOTE — Telephone Encounter (Signed)
Yes, we can see him. Please just put him in next slot.

## 2012-05-21 NOTE — Telephone Encounter (Signed)
Patient calling for a reply from the message that was left on Monday about transferring to Dr. Dan Humphreys.

## 2012-05-22 NOTE — Telephone Encounter (Signed)
Scheduled and pt aware.

## 2012-05-25 NOTE — Telephone Encounter (Signed)
Noted  

## 2012-05-27 ENCOUNTER — Other Ambulatory Visit: Payer: Self-pay | Admitting: Internal Medicine

## 2012-05-27 NOTE — Telephone Encounter (Signed)
Rx request to pharmacy #[id number]; *PATIENT WILL BE CHANGING PCP to Dr. Dan Humphreys [facility]*/[facility]

## 2012-06-15 ENCOUNTER — Encounter: Payer: Self-pay | Admitting: Gastroenterology

## 2012-06-15 ENCOUNTER — Encounter: Payer: Self-pay | Admitting: Internal Medicine

## 2012-06-15 ENCOUNTER — Ambulatory Visit (INDEPENDENT_AMBULATORY_CARE_PROVIDER_SITE_OTHER): Payer: Medicare Other | Admitting: Internal Medicine

## 2012-06-15 VITALS — BP 157/62 | HR 60 | Temp 98.0°F | Ht 69.5 in | Wt 180.0 lb

## 2012-06-15 DIAGNOSIS — F17200 Nicotine dependence, unspecified, uncomplicated: Secondary | ICD-10-CM

## 2012-06-15 DIAGNOSIS — I4891 Unspecified atrial fibrillation: Secondary | ICD-10-CM

## 2012-06-15 DIAGNOSIS — I10 Essential (primary) hypertension: Secondary | ICD-10-CM

## 2012-06-15 DIAGNOSIS — M545 Low back pain: Secondary | ICD-10-CM

## 2012-06-15 DIAGNOSIS — Z720 Tobacco use: Secondary | ICD-10-CM

## 2012-06-15 DIAGNOSIS — Z1211 Encounter for screening for malignant neoplasm of colon: Secondary | ICD-10-CM

## 2012-06-15 DIAGNOSIS — M109 Gout, unspecified: Secondary | ICD-10-CM

## 2012-06-15 LAB — COMPREHENSIVE METABOLIC PANEL
CO2: 32 mEq/L (ref 19–32)
Creatinine, Ser: 1 mg/dL (ref 0.4–1.5)
GFR: 81.85 mL/min (ref >60.00)
Glucose, Bld: 89 mg/dL (ref 70–99)
Total Bilirubin: 0.6 mg/dL (ref 0.3–1.2)

## 2012-06-15 LAB — URIC ACID: Uric Acid, Serum: 4.6 mg/dL (ref 4.0–7.8)

## 2012-06-15 NOTE — Assessment & Plan Note (Signed)
Symptomatically doing well with minimal episodes of palpitations. Will continue diltiazem and metoprolol. Continue aspirin.

## 2012-06-15 NOTE — Assessment & Plan Note (Signed)
Will set up screening colonoscopy. 

## 2012-06-15 NOTE — Assessment & Plan Note (Signed)
Symptoms fairly well-controlled with use of tramadol. Discussed increasing dose to 100 mg twice daily. We'll continue to monitor.

## 2012-06-15 NOTE — Progress Notes (Signed)
Subjective:    Patient ID: Greg Hughes, male    DOB: 30-Mar-1940, 72 y.o.   MRN: [id number]  HPI 72 year old male with history of atrial fibrillation, hyperlipidemia, chronic low back pain, gout presents to establish care. He was previously seen by another provider in [place]. He reports he is generally feeling well. In regards to atrial fibrillation, he only rarely takes an extra dose of metoprolol to help control palpitations. He denies any shortness of breath or chest pain. In regards to gout, he reports no recent flares. In regards to chronic low back pain, he reports some improvement with use of tramadol. Pain is described as aching in his lower back which generally does not radiate. He continues to smoke about one pack per day but is trying to switch over to an e-cigarette.  Outpatient Encounter Prescriptions as of 06/15/2012  Medication Sig Dispense Refill  . allopurinol (ZYLOPRIM) 300 MG tablet Take 1 tablet (300 mg total) by mouth daily.  30 tablet  6  . aspirin 81 MG tablet Take 81 mg by mouth daily.        Marland Kitchen diltiazem (CARDIZEM CD) 180 MG 24 hr capsule Take 1 capsule (180 mg total) by mouth daily.  90 capsule  4  . finasteride (PROSCAR) 5 MG tablet TAKE 1 TABLET BY MOUTH EVERY DAY  90 tablet  1  . metoprolol tartrate (LOPRESSOR) 25 MG tablet Take 1 tablet (25 mg total) by mouth 2 (two) times daily.  180 tablet  4  . simvastatin (ZOCOR) 10 MG tablet Take 1 tablet (10 mg total) by mouth at bedtime.  90 tablet  3  . terazosin (HYTRIN) 10 MG capsule TAKE ONE CAPSULE BY MOUTH AT BEDTIME  30 capsule  0  . traMADol (ULTRAM) 50 MG tablet Take 50 mg by mouth every 6 (six) hours as needed.      . colchicine 0.6 MG tablet Take 1 tablet (0.6 mg total) by mouth 2 (two) times daily as needed.  30 tablet  3  . PROVENTIL HFA 108 (90 BASE) MCG/ACT inhaler        No facility-administered encounter medications on file as of 06/15/2012.   BP 157/62  Pulse 60  Temp(Src) 98 F (36.7 C) (Oral)  Ht  5' 9.5" (1.765 m)  Wt 180 lb (81.647 kg)  BMI 26.21 kg/m2  SpO2 97%  Review of Systems  Constitutional: Negative for fever, chills, activity change, appetite change, fatigue and unexpected weight change.  Eyes: Negative for visual disturbance.  Respiratory: Negative for cough and shortness of breath.   Cardiovascular: Negative for chest pain, palpitations and leg swelling.  Gastrointestinal: Negative for abdominal pain and abdominal distention.  Genitourinary: Negative for dysuria, urgency and difficulty urinating.  Musculoskeletal: Positive for back pain. Negative for arthralgias and gait problem.  Skin: Negative for color change and rash.  Hematological: Negative for adenopathy.  Psychiatric/Behavioral: Negative for sleep disturbance and dysphoric mood. The patient is not nervous/anxious.        Objective:   Physical Exam  Constitutional: He is oriented to person, place, and time. He appears well-developed and well-nourished. No distress.  HENT:  Head: Normocephalic and atraumatic.  Right Ear: External ear normal.  Left Ear: External ear normal.  Nose: Nose normal.  Mouth/Throat: Oropharynx is clear and moist. No oropharyngeal exudate.  Eyes: Conjunctivae and EOM are normal. Pupils are equal, round, and reactive to light. Right eye exhibits no discharge. Left eye exhibits no discharge. No scleral icterus.  Neck: Normal  range of motion. Neck supple. No tracheal deviation present. No thyromegaly present.  Cardiovascular: Normal rate, regular rhythm and normal heart sounds.  Exam reveals no gallop and no friction rub.   No murmur heard. Pulmonary/Chest: Effort normal and breath sounds normal. No accessory muscle usage. Not tachypneic. No respiratory distress. He has no decreased breath sounds. He has no wheezes. He has no rhonchi. He has no rales. He exhibits no tenderness.  Musculoskeletal: Normal range of motion. He exhibits no edema.       Lumbar back: He exhibits pain. He  exhibits normal range of motion, no tenderness and no bony tenderness.  Lymphadenopathy:    He has no cervical adenopathy.  Neurological: He is alert and oriented to person, place, and time. No cranial nerve deficit. Coordination normal.  Skin: Skin is warm and dry. No rash noted. He is not diaphoretic. No erythema. No pallor.  Psychiatric: He has a normal mood and affect. His behavior is normal. Judgment and thought content normal.          Assessment & Plan:

## 2012-06-15 NOTE — Assessment & Plan Note (Signed)
BP Readings from Last 3 Encounters:  06/15/12 157/62  02/27/12 112/68  02/26/12 110/60   Blood pressure slightly elevated today. However, has been well-controlled at home. Will continue to monitor. Patient will call if consistently greater than 150/90. Continue current medications.

## 2012-06-15 NOTE — Assessment & Plan Note (Signed)
Symptomatically doing well. Uric acid level normal. Will continue to monitor.

## 2012-06-22 ENCOUNTER — Ambulatory Visit: Payer: Medicare Other | Admitting: Internal Medicine

## 2012-06-27 ENCOUNTER — Other Ambulatory Visit: Payer: Self-pay | Admitting: Family Medicine

## 2012-07-09 ENCOUNTER — Ambulatory Visit (AMBULATORY_SURGERY_CENTER): Payer: Medicare Other | Admitting: *Deleted

## 2012-07-09 VITALS — Ht 69.5 in | Wt 181.6 lb

## 2012-07-09 DIAGNOSIS — Z1211 Encounter for screening for malignant neoplasm of colon: Secondary | ICD-10-CM

## 2012-07-09 MED ORDER — NA SULFATE-K SULFATE-MG SULF 17.5-3.13-1.6 GM/177ML PO SOLN: ORAL | Status: DC

## 2012-07-14 ENCOUNTER — Encounter: Payer: Self-pay | Admitting: Gastroenterology

## 2012-07-23 ENCOUNTER — Ambulatory Visit (AMBULATORY_SURGERY_CENTER): Payer: Medicare Other | Admitting: Gastroenterology

## 2012-07-23 ENCOUNTER — Encounter: Payer: Self-pay | Admitting: Gastroenterology

## 2012-07-23 VITALS — BP 119/70 | HR 56 | Temp 97.0°F | Resp 12 | Ht 69.0 in | Wt 181.0 lb

## 2012-07-23 DIAGNOSIS — D126 Benign neoplasm of colon, unspecified: Secondary | ICD-10-CM

## 2012-07-23 DIAGNOSIS — Z86010 Personal history of colonic polyps: Secondary | ICD-10-CM

## 2012-07-23 DIAGNOSIS — Z1211 Encounter for screening for malignant neoplasm of colon: Secondary | ICD-10-CM

## 2012-07-23 MED ORDER — SODIUM CHLORIDE 0.9 % IV SOLN: 500.0000 mL | INTRAVENOUS | Status: DC

## 2012-07-23 NOTE — Op Note (Signed)
[facility] [address] [place], [address]   COLONOSCOPY PROCEDURE REPORT  PATIENT: Greg Hughes, Greg Hughes.  MR#: [id number] BIRTHDATE: 26-Mar-1940 , 72  yrs. old GENDER: Male ENDOSCOPIST: Meryl Dare, MD, [facility] PROCEDURE DATE:  07/23/2012 PROCEDURE:   Colonoscopy with biopsy and snare polypectomy ASA CLASS:   Class II INDICATIONS:Patient's personal history of adenomatous colon polyps and Last colonoscopy performed 4 years ago. MEDICATIONS: MAC sedation, administered by CRNA and propofol (Diprivan) 200mg  IV DESCRIPTION OF PROCEDURE:   After the risks benefits and alternatives of the procedure were thoroughly explained, informed consent was obtained.  A digital rectal exam revealed no abnormalities of the rectum.   The [facility] [id number]  endoscope was introduced through the anus and advanced to the cecum, which was identified by both the appendix and ileocecal valve. No adverse events experienced.   The quality of the prep was good, using MoviPrep  The instrument was then slowly withdrawn as the colon was fully examined.  COLON FINDINGS: Two sessile polyps measuring 4 mm in size were found in the ascending colon.  A polypectomy was performed with cold forceps.  The resection was complete and the polyp tissue was completely retrieved.   Three sessile polyps measuring 4-7 mm in size were found in the transverse colon.  A polypectomy was performed with a cold snare.  The resection was complete and the polyp tissue was completely retrieved.   A sessile polyp measuring 6 mm in size was found in the sigmoid colon.  A polypectomy was performed with a cold snare.  The resection was complete and the polyp tissue was completely retrieved.   Moderate diverticulosis was noted in the sigmoid colon.   The colon was otherwise normal. There was no diverticulosis, inflammation, polyps or cancers unless previously stated.  Retroflexed views revealed no abnormalities. The time  to cecum=2 minutes 14 seconds.  Withdrawal time=12 minutes 31 seconds.  The scope was withdrawn and the procedure completed. COMPLICATIONS: There were no complications.  ENDOSCOPIC IMPRESSION: 1.   Two sessile polyps measuring 4 mm in the ascending colon; polypectomy performed with cold forceps 2.   Three sessile polyps measuring 4-7 mm in the transverse colon; polypectomy performed with a cold snare 3.   Sessile polyp measuring 6 mm in the sigmoid colon; polypectomy performed with a cold snare 4.   Moderate diverticulosis was noted in the sigmoid colon  RECOMMENDATIONS: 1.  Await pathology results 2.  High fiber diet with liberal fluid intake. 3.  Repeat colonoscopy in 3 years if polyp(s) adenomatous; otherwise 5 years  eSigned:  Meryl Dare, MD, [facility] 07/23/2012 11:07 AM

## 2012-07-23 NOTE — Progress Notes (Signed)
Report to pacu rn, vss, bbs=clear 

## 2012-07-23 NOTE — Patient Instructions (Addendum)
YOU HAD AN ENDOSCOPIC PROCEDURE TODAY AT [facility]: Refer to the procedure report that was given to you for any specific questions about what was found during the examination.  If the procedure report does not answer your questions, please call your gastroenterologist to clarify.  If you requested that your care partner not be given the details of your procedure findings, then the procedure report has been included in a sealed envelope for you to review at your convenience later.  YOU SHOULD EXPECT: Some feelings of bloating in the abdomen. Passage of more gas than usual.  Walking can help get rid of the air that was put into your GI tract during the procedure and reduce the bloating. If you had a lower endoscopy (such as a colonoscopy or flexible sigmoidoscopy) you may notice spotting of blood in your stool or on the toilet paper. If you underwent a bowel prep for your procedure, then you may not have a normal bowel movement for a few days.  DIET: Your first meal following the procedure should be a light meal and then it is ok to progress to your normal diet.  A half-sandwich or bowl of soup is an example of a good first meal.  Heavy or fried foods are harder to digest and may make you feel nauseous or bloated.  Likewise meals heavy in dairy and vegetables can cause extra gas to form and this can also increase the bloating.  Drink plenty of fluids but you should avoid alcoholic beverages for 24 hours.  ACTIVITY: Your care partner should take you home directly after the procedure.  You should plan to take it easy, moving slowly for the rest of the day.  You can resume normal activity the day after the procedure however you should NOT DRIVE or use heavy machinery for 24 hours (because of the sedation medicines used during the test).    SYMPTOMS TO REPORT IMMEDIATELY: A gastroenterologist can be reached at any hour.  During normal business hours, 8:30 AM to 5:00 PM Monday through Friday,  call [phone].  After hours and on weekends, please call the GI answering service at [phone] who will take a message and have the physician on call contact you.   Following lower endoscopy (colonoscopy or flexible sigmoidoscopy):  Excessive amounts of blood in the stool  Significant tenderness or worsening of abdominal pains  Swelling of the abdomen that is new, acute  Fever of 100F or higher     FOLLOW UP: If any biopsies were taken you will be contacted by phone or by letter within the next 1-3 weeks.  Call your gastroenterologist if you have not heard about the biopsies in 3 weeks.  Our staff will call the home number listed on your records the next business day following your procedure to check on you and address any questions or concerns that you may have at that time regarding the information given to you following your procedure. This is a courtesy call and so if there is no answer at the home number and we have not heard from you through the emergency physician on call, we will assume that you have returned to your regular daily activities without incident.  SIGNATURES/CONFIDENTIALITY: You and/or your care partner have signed paperwork which will be entered into your electronic medical record.  These signatures attest to the fact that that the information above on your After Visit Summary has been reviewed and is understood.  Full responsibility of the  confidentiality of this discharge information lies with you and/or your care-partner.  Polyp, diverticulosis, and high fiber diet information given.  Liberal fluid intake encouraged with diet.

## 2012-07-23 NOTE — Progress Notes (Signed)
Called to room to assist during endoscopic procedure.  Patient ID and intended procedure confirmed with present staff. Received instructions for my participation in the procedure from the performing physician.  

## 2012-07-23 NOTE — Progress Notes (Signed)
Patient did not experience any of the following events: a burn prior to discharge; a fall within the facility; wrong site/side/patient/procedure/implant event; or a hospital transfer or hospital admission upon discharge from the facility. (G8907) Patient did not have preoperative order for IV antibiotic SSI prophylaxis. (G8918)  

## 2012-07-24 ENCOUNTER — Telehealth: Payer: Self-pay | Admitting: *Deleted

## 2012-07-24 NOTE — Telephone Encounter (Signed)
  Follow up Call-  Call back number 07/23/2012  Post procedure Call Back phone  # [phone]  Permission to leave phone message Yes     Patient questions:  Do you have a fever, pain , or abdominal swelling? no Pain Score  0 *  Have you tolerated food without any problems? yes  Have you been able to return to your normal activities? no  Do you have any questions about your discharge instructions: Diet   no Medications  no Follow up visit  no  Do you have questions or concerns about your Care? no  Actions: * If pain score is 4 or above: No action needed, pain <4.

## 2012-07-28 ENCOUNTER — Telehealth: Payer: Self-pay | Admitting: Family Medicine

## 2012-07-28 NOTE — Telephone Encounter (Signed)
Refill- terazosin 10mg  capsule. Take one capsule by mouth at bedtime. Qty 30 last fill 5.12.14

## 2012-07-28 NOTE — Telephone Encounter (Signed)
It looks like Pt has establish care with Dr walker on 06-15-12 .Please advise if you would like to continue to fill this med or would you like this to be sent to new PCP. Last filled 06-27-12 #30,, last OV 02-27-12 Dr Rodena Medin

## 2012-07-28 NOTE — Telephone Encounter (Signed)
Please forward to new PMD

## 2012-07-29 NOTE — Telephone Encounter (Signed)
Fine to refill #30 with years worth of refills

## 2012-07-30 MED ORDER — TERAZOSIN HCL 10 MG PO CAPS: 10.0000 mg | ORAL_CAPSULE | Freq: Every day | ORAL | Status: DC

## 2012-07-30 NOTE — Telephone Encounter (Signed)
Done

## 2012-08-02 ENCOUNTER — Encounter: Payer: Self-pay | Admitting: Gastroenterology

## 2012-09-29 ENCOUNTER — Other Ambulatory Visit: Payer: Self-pay | Admitting: Family Medicine

## 2012-09-29 ENCOUNTER — Other Ambulatory Visit: Payer: Self-pay | Admitting: Internal Medicine

## 2012-09-30 ENCOUNTER — Encounter: Payer: Self-pay | Admitting: Internal Medicine

## 2012-09-30 ENCOUNTER — Ambulatory Visit: Payer: Self-pay | Admitting: Internal Medicine

## 2012-09-30 ENCOUNTER — Telehealth: Payer: Self-pay | Admitting: Internal Medicine

## 2012-09-30 ENCOUNTER — Ambulatory Visit (INDEPENDENT_AMBULATORY_CARE_PROVIDER_SITE_OTHER): Payer: Medicare Other | Admitting: Internal Medicine

## 2012-09-30 VITALS — BP 150/80 | HR 65 | Temp 98.3°F | Ht 69.0 in | Wt 182.0 lb

## 2012-09-30 DIAGNOSIS — R911 Solitary pulmonary nodule: Secondary | ICD-10-CM

## 2012-09-30 DIAGNOSIS — F17200 Nicotine dependence, unspecified, uncomplicated: Secondary | ICD-10-CM

## 2012-09-30 DIAGNOSIS — E785 Hyperlipidemia, unspecified: Secondary | ICD-10-CM

## 2012-09-30 DIAGNOSIS — I10 Essential (primary) hypertension: Secondary | ICD-10-CM

## 2012-09-30 DIAGNOSIS — M545 Low back pain: Secondary | ICD-10-CM

## 2012-09-30 DIAGNOSIS — M109 Gout, unspecified: Secondary | ICD-10-CM | POA: Insufficient documentation

## 2012-09-30 DIAGNOSIS — Z0000 Encounter for general adult medical examination without abnormal findings: Secondary | ICD-10-CM | POA: Insufficient documentation

## 2012-09-30 LAB — COMPREHENSIVE METABOLIC PANEL
ALT: 36 U/L (ref 0–53)
AST: 26 U/L (ref 0–37)
Calcium: 9.6 mg/dL (ref 8.4–10.5)
Chloride: 104 mEq/L (ref 96–112)
Creatinine, Ser: 1 mg/dL (ref 0.4–1.5)

## 2012-09-30 LAB — LIPID PANEL
HDL: 44.9 mg/dL (ref >39.00)
LDL Cholesterol: 64 mg/dL (ref 0–99)
Total CHOL/HDL Ratio: 3
VLDL: 17 mg/dL (ref 0.0–40.0)

## 2012-09-30 LAB — MICROALBUMIN / CREATININE URINE RATIO: Microalb, Ur: 0.2 mg/dL (ref 0.0–1.9)

## 2012-09-30 MED ORDER — MELOXICAM 15 MG PO TABS: 15.0000 mg | ORAL_TABLET | Freq: Every day | ORAL | Status: DC

## 2012-09-30 MED ORDER — COLCHICINE 0.6 MG PO TABS: 0.6000 mg | ORAL_TABLET | Freq: Two times a day (BID) | ORAL | Status: DC | PRN

## 2012-09-30 MED ORDER — ALLOPURINOL 300 MG PO TABS: 300.0000 mg | ORAL_TABLET | Freq: Every day | ORAL | Status: DC

## 2012-09-30 MED ORDER — TRAMADOL HCL 50 MG PO TABS: 50.0000 mg | ORAL_TABLET | Freq: Three times a day (TID) | ORAL | Status: DC | PRN

## 2012-09-30 MED ORDER — TERAZOSIN HCL 10 MG PO CAPS
10.0000 mg | ORAL_CAPSULE | Freq: Every day | ORAL | Status: DC
Start: 2012-09-30 — End: 2013-10-15

## 2012-09-30 MED ORDER — LIDOCAINE 5 % EX PTCH: 1.0000 | MEDICATED_PATCH | CUTANEOUS | Status: DC

## 2012-09-30 NOTE — Progress Notes (Signed)
Subjective:    Patient ID: Greg Hughes, male    DOB: 1941-02-13, 72 y.o.   MRN: [id number]  HPI The patient is here for annual Medicare wellness examination and management of other chronic and acute problems.   The risk factors are reflected in the social history.  The roster of all physicians providing medical care to patient - is listed in the Snapshot section of the chart.  Activities of daily living:  The patient is 100% independent in all ADLs: dressing, toileting, feeding as well as independent mobility  Home safety : The patient has smoke detectors in the home. They wear seatbelts.  There are no firearms at home. There is no violence in the home.   There is no risks for hepatitis, STDs or HIV. There is no history of blood transfusion. They have no travel history to infectious disease endemic areas of the world.  The patient has not seen their dentist in the last six month. Wears partial dentures.  They have seen their eye doctor in the last year. Opthalmology - Dr. Alexia Freestone No issues with hearing. They have deferred audiologic testing in the last year.   They do not  have excessive sun exposure. Discussed the need for sun protection: hats, long sleeves and use of sunscreen if there is significant sun exposure. Dermatologist - Dr. Gwen Pounds  Diet: the importance of a healthy diet is discussed. They do have a healthy diet.  The benefits of regular aerobic exercise were discussed.  Exercise is limited by back pain.  Depression screen: there are no signs or vegative symptoms of depression- irritability, change in appetite, anhedonia, sadness/tearfullness.  Cognitive assessment: the patient manages all their financial and personal affairs and is actively engaged. They could relate day,date,year and events.  The following portions of the patient's history were reviewed and updated as appropriate: allergies, current medications, past family history, past medical history,  past  surgical history, past social history  and problem list.  HCPOA - wife, Ruhan Borak  Visual acuity was not assessed per patient preference since he has regular follow up with his ophthalmologist. Hearing and body mass index were assessed and reviewed.   During the course of the visit the patient was educated and counseled about appropriate screening and preventive services including : fall prevention , diabetes screening, nutrition counseling, colorectal cancer screening, and recommended immunizations.    Patient is also concerned today about chronic, gradually worsening low back pain. 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 He underwent epidural steroid injection with minimal improvement. He reports over the last several months the pain has gradually worsened. He now has pain in his lower back which radiates down the back of both of his legs. It limits his ability to participate in physical activity. He is currently taking tramadol with minimal improvement in his symptoms.  Outpatient Encounter Prescriptions as of 09/30/2012  Medication Sig Dispense Refill  . allopurinol (ZYLOPRIM) 300 MG tablet Take 1 tablet (300 mg total) by mouth daily.  30 tablet  5  . aspirin 81 MG tablet Take 81 mg by mouth daily.        . colchicine 0.6 MG tablet Take 1 tablet (0.6 mg total) by mouth 2 (two) times daily as needed.  30 tablet  3  . diltiazem (CARDIZEM CD) 180 MG 24 hr capsule Take 1 capsule (180 mg total) by mouth  daily.  90 capsule  4  . finasteride (PROSCAR) 5 MG tablet Take 1 tablet (5 mg total) by mouth daily.  90 tablet  1  . metoprolol tartrate (LOPRESSOR) 25 MG tablet Take 1 tablet (25 mg total) by mouth 2 (two) times daily.  180 tablet  4  . simvastatin (ZOCOR) 10 MG tablet Take 1 tablet (10 mg total) by mouth at bedtime.  90 tablet  3  . terazosin (HYTRIN) 10 MG capsule Take 1 capsule  (10 mg total) by mouth at bedtime.  30 capsule  11  . traMADol (ULTRAM) 50 MG tablet Take 50 mg by mouth every 6 (six) hours as needed.      . lidocaine (LIDODERM) 5 % Place 1 patch onto the skin daily. Remove & Discard patch within 12 hours or as directed by MD  30 patch  0  . meloxicam (MOBIC) 15 MG tablet Take 1 tablet (15 mg total) by mouth daily.  30 tablet  0  . PROVENTIL HFA 108 (90 BASE) MCG/ACT inhaler        No facility-administered encounter medications on file as of 09/30/2012.   BP 150/80  Pulse 65  Temp(Src) 98.3 F (36.8 C) (Oral)  Ht 5\' 9"  (1.753 m)  Wt 182 lb (82.555 kg)  BMI 26.86 kg/m2  SpO2 96%   Review of Systems  Constitutional: Negative for fever, chills, activity change, appetite change, fatigue and unexpected weight change.  Eyes: Negative for visual disturbance.  Respiratory: Negative for cough and shortness of breath.   Cardiovascular: Negative for chest pain, palpitations and leg swelling.  Gastrointestinal: Negative for abdominal pain and abdominal distention.  Genitourinary: Negative for dysuria, urgency and difficulty urinating.  Musculoskeletal: Positive for back pain. Negative for arthralgias and gait problem.  Skin: Negative for color change and rash.  Hematological: Negative for adenopathy.  Psychiatric/Behavioral: Negative for sleep disturbance and dysphoric mood. The patient is not nervous/anxious.        Objective:   Physical Exam  Constitutional: He is oriented to person, place, and time. He appears well-developed and well-nourished. No distress.  HENT:  Head: Normocephalic and atraumatic.  Right Ear: External ear normal.  Left Ear: External ear normal.  Nose: Nose normal.  Mouth/Throat: Oropharynx is clear and moist. No oropharyngeal exudate.  Eyes: Conjunctivae and EOM are normal. Pupils are equal, round, and reactive to light. Right eye exhibits no discharge. Left eye exhibits no discharge. No scleral icterus.  Neck: Normal range of  motion. Neck supple. No tracheal deviation present. No thyromegaly present.  Cardiovascular: Normal rate, regular rhythm and normal heart sounds.  Exam reveals no gallop and no friction rub.   No murmur heard. Pulmonary/Chest: Effort normal and breath sounds normal. No respiratory distress. He has no wheezes. He has no rales. He exhibits no tenderness.  Abdominal: Soft. Bowel sounds are normal. He exhibits no distension and no mass. There is no tenderness. There is no rebound and no guarding.  Musculoskeletal: He exhibits no edema.       Lumbar back: He exhibits decreased range of motion, tenderness, bony tenderness and pain.  Lymphadenopathy:    He has no cervical adenopathy.  Neurological: He is alert and oriented to person, place, and time. No cranial nerve deficit. Coordination normal.  Skin: Skin is warm and dry. No rash noted. He is not diaphoretic. No erythema. No pallor.  Psychiatric: He has a normal mood and affect. His behavior is normal. Judgment and thought content normal.  Assessment & Plan:

## 2012-09-30 NOTE — Telephone Encounter (Signed)
Called pt with results of CT chest which showed 8mm nodule in the left lower lung. We will set up CT surgery consult to see if biopsy would be possible versus close follow up. Pt prefers [facility].

## 2012-09-30 NOTE — Assessment & Plan Note (Signed)
Encouraged smoking cessation. Will set up CT chest for lung cancer screening. 

## 2012-09-30 NOTE — Assessment & Plan Note (Signed)
General medical exam normal today. Encouraged smoking cessation. Will check labs including CMP and lipids as well as urine microalbumin. Will not check PSA today as normal in 02/2012. EKG normal today. Encouraged healthy diet and regular physical activity. Note that exercise is limited because of low back pain.

## 2012-09-30 NOTE — Assessment & Plan Note (Signed)
Chronic low back pain, gradually worsening. Using some OTC ibuprofen, will change to Meloxicam. Discussed potential risks of this medication. Will continue prn tramadol. Will add Lidoderm patch. Reviewed records on previous MRI lumbar spine and epidural injections from 2013. Given no improvement with conservative measures, will set up neurosurgery evaluation.

## 2012-10-05 ENCOUNTER — Telehealth: Payer: Self-pay | Admitting: Internal Medicine

## 2012-10-05 NOTE — Telephone Encounter (Signed)
Called left message he would like to discuss this patient with you as well as discuss the CT Screening Program.

## 2012-10-06 ENCOUNTER — Encounter: Payer: Self-pay | Admitting: Thoracic Surgery (Cardiothoracic Vascular Surgery)

## 2012-10-06 ENCOUNTER — Institutional Professional Consult (permissible substitution) (INDEPENDENT_AMBULATORY_CARE_PROVIDER_SITE_OTHER): Payer: Medicare Other | Admitting: Thoracic Surgery (Cardiothoracic Vascular Surgery)

## 2012-10-06 VITALS — BP 147/73 | HR 74 | Resp 18 | Ht 69.0 in | Wt 180.0 lb

## 2012-10-06 DIAGNOSIS — R911 Solitary pulmonary nodule: Secondary | ICD-10-CM

## 2012-10-06 NOTE — Progress Notes (Addendum)
PCP is Wynona Dove, MD Referring Provider is Wynona Dove*  Chief Complaint  Patient presents with  . Lung Lesion    Surgical eval on Left lower lobe pulmonary nodule, Chest CT 09/30/12     HPI: 72 year old gentleman presents with chief complaint of a lung nodule.  [name] is a 72 year old gentleman who recently was having an annual health evaluation. He has a 60-70-pack-year history of smoking and was advised to have a low dose CT to screen for lung cancer. That was done on August 13. It showed a small nodule in the left lower lobe.  He says that he has been feeling well except for back pain. He has known lumbar disc disease and is having a great deal of sciatic pain. He is scheduled to see Dr. Lovell Sheehan about that on Friday. He's currently smoking about a pack a day. He has an occasional cough, nonproductive. He denies hemoptysis. He denies any chest pain. He has not had any shortness of breath or wheezing recently. He says he's lost about 10 pounds through diet and exercise, his weight is now been stable for over a month. He is retired from [employer]. He remains active.  Zubrod score is 0   Past Medical History  Diagnosis Date  . Hypertension   . GERD (gastroesophageal reflux disease)   . Hyperlipidemia   . History of colon polyps   . BPH (benign prostatic hypertrophy)   . Microscopic hematuria   . ED (erectile dysfunction)   . Tobacco abuse   . A-fib 2013  . Lumbar herniated disc   . Spinal stenosis   . CAD (coronary artery disease)     Past Surgical History  Procedure Laterality Date  . Inguinal hernia repair    . Tonsillectomy    . Colonoscopy  2002    Family History  Problem Relation Age of Onset  . Diabetes Father   . Coronary artery disease Father   . Stroke Father   . Arthritis Mother   . Hiatal hernia Mother   . Colon cancer Neg Hx     Social History History  Substance Use Topics  . Smoking status: Current Every Day  Smoker -- 1.00 packs/day for 0 years    Types: Cigarettes  . Smokeless tobacco: Never Used  . Alcohol Use: Yes     Comment: rare    Current Outpatient Prescriptions  Medication Sig Dispense Refill  . allopurinol (ZYLOPRIM) 300 MG tablet Take 1 tablet (300 mg total) by mouth daily.  90 tablet  4  . aspirin 81 MG tablet Take 81 mg by mouth daily.        . colchicine 0.6 MG tablet Take 1 tablet (0.6 mg total) by mouth 2 (two) times daily as needed.  60 tablet  3  . diltiazem (CARDIZEM CD) 180 MG 24 hr capsule Take 1 capsule (180 mg total) by mouth daily.  90 capsule  4  . finasteride (PROSCAR) 5 MG tablet Take 1 tablet (5 mg total) by mouth daily.  90 tablet  1  . meloxicam (MOBIC) 15 MG tablet Take 1 tablet (15 mg total) by mouth daily.  30 tablet  0  . metoprolol tartrate (LOPRESSOR) 25 MG tablet Take 1 tablet (25 mg total) by mouth 2 (two) times daily.  180 tablet  4  . simvastatin (ZOCOR) 10 MG tablet Take 1 tablet (10 mg total) by mouth at bedtime.  90 tablet  3  . terazosin (HYTRIN) 10 MG  capsule Take 1 capsule (10 mg total) by mouth at bedtime.  90 capsule  4  . traMADol (ULTRAM) 50 MG tablet Take 1-2 tablets (50-100 mg total) by mouth every 8 (eight) hours as needed.  90 tablet  4  . lidocaine (LIDODERM) 5 % Place 1 patch onto the skin daily. Remove & Discard patch within 12 hours or as directed by MD  30 patch  0  . PROVENTIL HFA 108 (90 BASE) MCG/ACT inhaler        No current facility-administered medications for this visit.    No Known Allergies  Review of Systems  Constitutional: Negative for activity change, appetite change and fatigue.       Has lost 10 pounds over 3 months with diet and exercise  Eyes:       Wears glasses no recent changes  Genitourinary: Positive for frequency and difficulty urinating.  Musculoskeletal: Positive for back pain.  All other systems reviewed and are negative.    BP 147/73  Pulse 74  Resp 18  Ht 5\' 9"  (1.753 m)  Wt 180 lb (81.647 kg)   BMI 26.57 kg/m2  SpO2 98% Physical Exam  Vitals reviewed. Constitutional: He is oriented to person, place, and time. He appears well-developed and well-nourished. No distress.  HENT:  Head: Normocephalic and atraumatic.  Eyes: EOM are normal. Pupils are equal, round, and reactive to light.  Neck: Neck supple. No thyromegaly present.  Right carotid bruit  Cardiovascular: Normal heart sounds and intact distal pulses.   Slightly irregular  Pulmonary/Chest: Effort normal and breath sounds normal. No respiratory distress. He has no wheezes. He has no rales.  Abdominal: Soft. There is no tenderness.  Musculoskeletal: He exhibits no edema.  No clubbing or cyanosis  Lymphadenopathy:    He has no cervical adenopathy.  Neurological: He is alert and oriented to person, place, and time. No cranial nerve deficit.  Skin: Skin is warm and dry.     Diagnostic Tests: Low dose screening CT eighth 1314  8 mm left lower lobe pulmonary nodule.  No pathologically enlarged hilar axillary or mediastinal lymph nodes  There is coronary artery atherosclerosis involving the left main, LAD, circumflex, and right coronary  2 cm hypodense right adrenal mass measuring 12 Hounsfield units most consistent with an adrenal adenoma.  Impression: 72 year old smoker with a newly discovered 8 mm left lower lobe pulmonary nodule. This was found on a low-dose screening CT. This is a relatively round nodule. I discussed the differential diagnosis with Mr. Luviano. He understands this could represent lung cancer, metastatic cancer, a benign tumor such as a hamartoma, a reactive intrapulmonary lymph node, or granulomatous disease.  Certainly given his smoking history we need to consider the possibility that this could be a lung cancer. This lesion is small and relatively central in the basilar segment of the left lower lobe. It is not amenable to either bronchoscopic or percutaneous sampling. Although there is a  possibility that either of dose techniques could provide a diagnosis, a negative result would be meaningless.  That really leaves [name] with 2 viable options. The first would be continued radiographic followup. The second would be left VATS for excisional biopsy. We discussed the pros and cons of each of those approaches. After discussion he wishes to be followed radiographically. Given the size of this lesion, I think 3 months would be appropriate followup interval  The importance of smoking cessation was emphasized.  Plan:  Return in 3 months with CT of chest  to followup left lower lobe nodule

## 2012-10-06 NOTE — Telephone Encounter (Signed)
Spoke with him, he would like to speak with Dr. Dan Humphreys in reference to patient. Informed him she would finish up in clinic about 12. Please call him at this number [phone]

## 2012-10-08 ENCOUNTER — Encounter: Payer: Self-pay | Admitting: Internal Medicine

## 2012-10-23 ENCOUNTER — Encounter: Payer: Self-pay | Admitting: Cardiovascular Disease

## 2012-10-23 ENCOUNTER — Ambulatory Visit (INDEPENDENT_AMBULATORY_CARE_PROVIDER_SITE_OTHER): Payer: Medicare Other | Admitting: Cardiovascular Disease

## 2012-10-23 ENCOUNTER — Encounter: Payer: Self-pay | Admitting: Internal Medicine

## 2012-10-23 VITALS — BP 138/60 | HR 60 | Ht 69.0 in | Wt 182.5 lb

## 2012-10-23 DIAGNOSIS — E785 Hyperlipidemia, unspecified: Secondary | ICD-10-CM

## 2012-10-23 DIAGNOSIS — I10 Essential (primary) hypertension: Secondary | ICD-10-CM

## 2012-10-23 DIAGNOSIS — I4891 Unspecified atrial fibrillation: Secondary | ICD-10-CM

## 2012-10-23 DIAGNOSIS — F17200 Nicotine dependence, unspecified, uncomplicated: Secondary | ICD-10-CM

## 2012-10-23 NOTE — Assessment & Plan Note (Signed)
Cholesterol is at goal on the current lipid regimen. No changes to the medications were made.  

## 2012-10-23 NOTE — Assessment & Plan Note (Signed)
Maintaining normal sinus rhythm. We have suggested he try metoprolol 12.5 mg twice a day with his diltiazem in the morning

## 2012-10-23 NOTE — Assessment & Plan Note (Signed)
Blood pressure is well controlled on today's visit. No changes made to the medications. 

## 2012-10-23 NOTE — Progress Notes (Signed)
Patient ID: Greg Hughes, male    DOB: May 24, 1940, 72 y.o.   MRN: [id number]  HPI Comments: Greg Hughes is a very pleasant 72 year old gentleman with a long history of smoking who continues to smoke one pack per day, hypertension who presented to the hospital in 2013, [place]  with palpitations and shortness of breath.  found to be in atrial fibrillation with RVR, rate 150 beats per minute. He was given IV Cardizem in the emergency room and he converted to normal sinus rhythm during his hospital course. He was discharged on Cardizem 120 mg daily.  On his last clinic visit, we suggested taking diltiazem in the morning, 25 mg metoprolol twice a day. He reports that he takes 25 mg: The evening, none in the morning. He has had 2 episodes of arrhythmia lasting for 15 minutes or more since his last clinic visit. Otherwise he feels well with no complaints   Echocardiogram 11/08/2011 shows normal systolic function, normal left atrial size, otherwise normal study  Total cholesterol 116, LDL 45, HDL 40 EKG shows normal sinus rhythm with rate 60 beats per minute, no significant ST or T wave changes   Outpatient Encounter Prescriptions as of 10/23/2012  Medication Sig Dispense Refill  . allopurinol (ZYLOPRIM) 300 MG tablet Take 1 tablet (300 mg total) by mouth daily.  90 tablet  4  . aspirin 81 MG tablet Take 81 mg by mouth daily.        . colchicine 0.6 MG tablet Take 1 tablet (0.6 mg total) by mouth 2 (two) times daily as needed.  60 tablet  3  . diltiazem (CARDIZEM CD) 180 MG 24 hr capsule Take 1 capsule (180 mg total) by mouth daily.  90 capsule  4  . finasteride (PROSCAR) 5 MG tablet Take 1 tablet (5 mg total) by mouth daily.  90 tablet  1  . lidocaine (LIDODERM) 5 % Place 1 patch onto the skin daily. Remove & Discard patch within 12 hours or as directed by MD  30 patch  0  . meloxicam (MOBIC) 15 MG tablet Take 1 tablet (15 mg total) by mouth daily.  30 tablet  0  . metoprolol tartrate (LOPRESSOR) 25  MG tablet Take 1 tablet (25 mg total) by mouth 2 (two) times daily.  180 tablet  4  . PROVENTIL HFA 108 (90 BASE) MCG/ACT inhaler       . simvastatin (ZOCOR) 10 MG tablet Take 1 tablet (10 mg total) by mouth at bedtime.  90 tablet  3  . terazosin (HYTRIN) 10 MG capsule Take 1 capsule (10 mg total) by mouth at bedtime.  90 capsule  4  . traMADol (ULTRAM) 50 MG tablet Take 1-2 tablets (50-100 mg total) by mouth every 8 (eight) hours as needed.  90 tablet  4   No facility-administered encounter medications on file as of 10/23/2012.    Review of Systems  HENT: Negative.   Eyes: Negative.   Respiratory: Negative.   Cardiovascular: Negative.   Gastrointestinal: Negative.   Musculoskeletal: Negative.   Skin: Negative.   Neurological: Negative.   Psychiatric/Behavioral: Negative.   All other systems reviewed and are negative.    BP 138/60  Pulse 60  Ht 5\' 9"  (1.753 m)  Wt 182 lb 8 oz (82.781 kg)  BMI 26.94 kg/m2  Physical Exam  Nursing note and vitals reviewed. Constitutional: He is oriented to person, place, and time. He appears well-developed and well-nourished.  HENT:  Head: Normocephalic.  Nose: Nose  normal.  Mouth/Throat: Oropharynx is clear and moist.  Eyes: Conjunctivae are normal. Pupils are equal, round, and reactive to light.  Neck: Normal range of motion. Neck supple. No JVD present.  Cardiovascular: Normal rate, regular rhythm, S1 normal, S2 normal, normal heart sounds and intact distal pulses.  Exam reveals no gallop and no friction rub.   No murmur heard. Pulmonary/Chest: Effort normal and breath sounds normal. No respiratory distress. He has no decreased breath sounds. He has no wheezes. He has no rales. He exhibits no tenderness.  Abdominal: Soft. Bowel sounds are normal. He exhibits no distension. There is no tenderness.  Musculoskeletal: Normal range of motion. He exhibits no edema and no tenderness.  Lymphadenopathy:    He has no cervical adenopathy.   Neurological: He is alert and oriented to person, place, and time. Coordination normal.  Skin: Skin is warm and dry. No rash noted. No erythema.  Psychiatric: He has a normal mood and affect. His behavior is normal. Judgment and thought content normal.      Assessment and Plan

## 2012-10-23 NOTE — Assessment & Plan Note (Signed)
We have encouraged him to continue to work on weaning his cigarettes and smoking cessation. He will continue to work on this and does not want any assistance with chantix.  

## 2012-10-23 NOTE — Patient Instructions (Addendum)
You are doing well. Please take metoprolol 12.5 mg twice a day (1/2 pill twice a day) Ok to take extra 1/2 or whole for palpitations  If you stay in atrial fibrillation, start Xarelto one a day (blood thinner), then call the office  Please call us if you have new issues that need to be addressed before your next appt.  Your physician wants you to follow-up in: 12 months.  You will receive a reminder letter in the mail two months in advance. If you don't receive a letter, please call our office to schedule the follow-up appointment.

## 2012-10-28 ENCOUNTER — Other Ambulatory Visit: Payer: Self-pay | Admitting: Internal Medicine

## 2012-10-28 NOTE — Telephone Encounter (Signed)
Last visit 09/30/12. Refill?

## 2012-11-02 ENCOUNTER — Other Ambulatory Visit: Payer: Self-pay | Admitting: Internal Medicine

## 2012-11-02 NOTE — Telephone Encounter (Signed)
Looks like this was printed on 8/13 for #90 with 4 refills

## 2012-12-18 ENCOUNTER — Other Ambulatory Visit: Payer: Self-pay | Admitting: *Deleted

## 2012-12-18 DIAGNOSIS — R911 Solitary pulmonary nodule: Secondary | ICD-10-CM

## 2013-01-05 ENCOUNTER — Encounter: Payer: Self-pay | Admitting: Thoracic Surgery (Cardiothoracic Vascular Surgery)

## 2013-01-05 ENCOUNTER — Ambulatory Visit: Payer: Self-pay | Admitting: Thoracic Surgery (Cardiothoracic Vascular Surgery)

## 2013-01-05 ENCOUNTER — Ambulatory Visit (INDEPENDENT_AMBULATORY_CARE_PROVIDER_SITE_OTHER): Payer: Medicare Other | Admitting: Thoracic Surgery (Cardiothoracic Vascular Surgery)

## 2013-01-05 ENCOUNTER — Other Ambulatory Visit: Payer: Medicare Other

## 2013-01-05 VITALS — BP 129/76 | HR 61 | Resp 16 | Ht 69.0 in | Wt 180.0 lb

## 2013-01-05 DIAGNOSIS — R911 Solitary pulmonary nodule: Secondary | ICD-10-CM

## 2013-01-05 DIAGNOSIS — R918 Other nonspecific abnormal finding of lung field: Secondary | ICD-10-CM | POA: Insufficient documentation

## 2013-01-05 NOTE — Progress Notes (Signed)
HPI:  Mr. Fearn returns today for 3 month followup. He is a 72 year old smoker who had a low dose screening CT done on 09/30/2012. He was found to have bilateral lung nodules. There was a small spiculated nodule in the right upper lobe was thought to represent scarring. There was a round 8 mm nodule in the left lower lobe. We discussed the options of attempting to biopsy the left lower lobe nodule. It would be difficult to get a good sample either bronchoscopically or percutaneously, and a negative result could not be relied upon. The other options were to do a VATS wedge resection or followed radiographically. After discussion he wished to followup radiographically.  In the interim since his last visit he continues to smoke one pack of cigarettes daily. He is ambivalent about trying to quit. He has an occasional cough, but nothing out of the ordinary. He has not been having any chest pain, shortness of breath, hemoptysis, wheezing, or weight loss.  Past Medical History  Diagnosis Date  . Hypertension   . GERD (gastroesophageal reflux disease)   . Hyperlipidemia   . History of colon polyps   . BPH (benign prostatic hypertrophy)   . Microscopic hematuria   . ED (erectile dysfunction)   . Tobacco abuse   . A-fib 2013  . Lumbar herniated disc   . Spinal stenosis   . CAD (coronary artery disease)       Current Outpatient Prescriptions  Medication Sig Dispense Refill  . allopurinol (ZYLOPRIM) 300 MG tablet Take 1 tablet (300 mg total) by mouth daily.  90 tablet  4  . aspirin 81 MG tablet Take 81 mg by mouth daily.        . colchicine 0.6 MG tablet Take 1 tablet (0.6 mg total) by mouth 2 (two) times daily as needed.  60 tablet  3  . diltiazem (CARDIZEM CD) 180 MG 24 hr capsule Take 1 capsule (180 mg total) by mouth daily.  90 capsule  4  . finasteride (PROSCAR) 5 MG tablet Take 1 tablet (5 mg total) by mouth daily.  90 tablet  1  . lidocaine (LIDODERM) 5 % Place 1 patch onto the skin  daily. Remove & Discard patch within 12 hours or as directed by MD  30 patch  0  . meloxicam (MOBIC) 15 MG tablet TAKE 1 TABLET BY MOUTH EVERY DAY  30 tablet  0  . metoprolol tartrate (LOPRESSOR) 25 MG tablet Take 1 tablet (25 mg total) by mouth 2 (two) times daily.  180 tablet  4  . PROVENTIL HFA 108 (90 BASE) MCG/ACT inhaler       . simvastatin (ZOCOR) 10 MG tablet Take 1 tablet (10 mg total) by mouth at bedtime.  90 tablet  3  . terazosin (HYTRIN) 10 MG capsule Take 1 capsule (10 mg total) by mouth at bedtime.  90 capsule  4  . traMADol (ULTRAM) 50 MG tablet Take 1-2 tablets (50-100 mg total) by mouth every 8 (eight) hours as needed.  90 tablet  4   No current facility-administered medications for this visit.    Physical Exam BP 129/76  Pulse 61  Resp 16  Ht 5\' 9"  (1.753 m)  Wt 180 lb (81.647 kg)  BMI 26.57 kg/m2  SpO2 97% Well-appearing 72 year old male in no acute distress Alert and oriented x3 No cervical adenopathy Lungs clear, no wheezing Cardiac regular rate and rhythm  Diagnostic Tests: CT chest [facility] 01/05/2013  Impression:  1.  No significant change and well circumscribed left lower lobe pulmonary nodule compared to prior CT from 3 months ago 2. Likewise a small spiculated density in the right upper lobe is also stable. No new or death enlarging nodule is identified. 3. 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 He has small bilateral lung nodules. The larger is in the left lower lobe and its about 8.6-9 mm in diameter. It is very well circumscribed. There is a second nodule in the right upper lobe that measures 7.6 mm, it is more irregular. Neither of these nodules is changed in the 3 month interval since his last scan.  Radiology recommended a 6 month followup scan. However,  given the size of these nodules in his ongoing tobacco use I do think a repeat scan in 3 months would be more appropriate in his case. If the lesions remain stable for 6 months, I think we could then go to 6 month followup after that.  Plan: Return in 3 months with CT of chest

## 2013-01-19 ENCOUNTER — Other Ambulatory Visit: Payer: Self-pay | Admitting: Internal Medicine

## 2013-01-19 NOTE — Telephone Encounter (Signed)
Refill

## 2013-02-12 ENCOUNTER — Telehealth: Payer: Self-pay | Admitting: Internal Medicine

## 2013-02-12 NOTE — Telephone Encounter (Signed)
Patient Information:  Caller Name: Kathlene November  Phone: [phone]  Patient: Greg Hughes, Greg Hughes  Gender: Male  DOB: [birth year]  Age: 72 Years  PCP: Ronna Polio (Adults only)  Office Follow Up:  Does the office need to follow up with this patient?: No  Instructions For The Office: N/A  RN Note:  All appointments are full, recommended that patient be seen at [facility] or ER for evaluation today.  He wanted appt for next week.  Symptoms  Reason For Call & Symptoms: Feels he might have a bone spur on his right heel.  He can hardly walk on this.  Reviewed Health History In EMR: Yes  Reviewed Medications In EMR: Yes  Reviewed Allergies In EMR: Yes  Reviewed Surgeries / Procedures: Yes  Date of Onset of Symptoms: 02/09/2013  Treatments Tried: Ibuprofen, Tramadol  still unable to walk, however this does help with his pain.  Treatments Tried Worked: Yes  Guideline(s) Used:  Foot Pain  Disposition Per Guideline:   Go to ED per Nursing Judgment, Office appts are full and pt is in severe pain and unable to walk on his right foot.  Reason For Disposition Reached:   Severe pain (e.g., excruciating, unable to do any normal activities)  Advice Given:  Gave pt phone numbers to [facility], [facility], and [facility] to check ER waits.  Advised with the   kind of pain he is experiencing, unfortunately, ER may be his best bet in order to get scans/xrays and medications to relieve his  pain while he is waiting on results.    Patient Will Follow Care Advice:  YES

## 2013-02-12 NOTE — Telephone Encounter (Signed)
FYI to Dr. Walker 

## 2013-03-01 ENCOUNTER — Other Ambulatory Visit: Payer: Self-pay | Admitting: Cardiovascular Disease

## 2013-03-17 ENCOUNTER — Other Ambulatory Visit: Payer: Self-pay | Admitting: Cardiovascular Disease

## 2013-03-23 ENCOUNTER — Other Ambulatory Visit: Payer: Self-pay | Admitting: *Deleted

## 2013-03-23 DIAGNOSIS — D381 Neoplasm of uncertain behavior of trachea, bronchus and lung: Secondary | ICD-10-CM

## 2013-04-06 ENCOUNTER — Ambulatory Visit: Payer: Commercial Managed Care - HMO | Admitting: Thoracic Surgery (Cardiothoracic Vascular Surgery)

## 2013-04-06 ENCOUNTER — Other Ambulatory Visit: Payer: Medicare Other

## 2013-04-20 ENCOUNTER — Ambulatory Visit
Admission: RE | Admit: 2013-04-20 | Discharge: 2013-04-20 | Disposition: A | Discharge status: Home / Self Care | Payer: Commercial Managed Care - HMO | Source: Ambulatory Visit | Attending: Thoracic Surgery (Cardiothoracic Vascular Surgery) | Admitting: Thoracic Surgery (Cardiothoracic Vascular Surgery)

## 2013-04-20 ENCOUNTER — Other Ambulatory Visit: Payer: Self-pay | Admitting: Internal Medicine

## 2013-04-20 ENCOUNTER — Telehealth: Payer: Self-pay | Admitting: Internal Medicine

## 2013-04-20 ENCOUNTER — Ambulatory Visit (INDEPENDENT_AMBULATORY_CARE_PROVIDER_SITE_OTHER): Payer: Commercial Managed Care - HMO | Admitting: Thoracic Surgery (Cardiothoracic Vascular Surgery)

## 2013-04-20 ENCOUNTER — Encounter: Payer: Self-pay | Admitting: Thoracic Surgery (Cardiothoracic Vascular Surgery)

## 2013-04-20 VITALS — BP 150/78 | HR 70 | Resp 20 | Ht 69.0 in | Wt 180.0 lb

## 2013-04-20 DIAGNOSIS — D381 Neoplasm of uncertain behavior of trachea, bronchus and lung: Secondary | ICD-10-CM

## 2013-04-20 NOTE — Telephone Encounter (Signed)
Relevant patient education assigned to patient using Emmi. ° °

## 2013-04-20 NOTE — Progress Notes (Signed)
HPI:  Greg Hughes returns for a 3 month followup visit.  He is a 73 year old gentleman with a 60-70-pack-year history of smoking. He was advised to have a low dose CT to screen for lung cancer. That was done on August of 2014. It showed a small nodule in the left lower lobe.   I saw him at that time and after discussion he wished to follow this radiographically. There were several other smaller nodules distributed throughout both lungs.  I saw back in November at which time the nodule was stable.  He now returns for a scheduled 3 month followup (6 months from original finding).  He says in the interim he has continued to smoke about a pack a day. He has tried all manner of aids including Chantix which an adverse effects and patches which were ineffective. His weight has been stable. He has a cough, occasionally productive of clear sputum. He denies hemoptysis. He remains physically active. He has not had any chest pains or shortness of breath.   Past Medical History  Diagnosis Date  . Hypertension   . GERD (gastroesophageal reflux disease)   . Hyperlipidemia   . History of colon polyps   . BPH (benign prostatic hypertrophy)   . Microscopic hematuria   . ED (erectile dysfunction)   . Tobacco abuse   . A-fib 2013  . Lumbar herniated disc   . Spinal stenosis   . CAD (coronary artery disease)     Current Outpatient Prescriptions  Medication Sig Dispense Refill  . allopurinol (ZYLOPRIM) 300 MG tablet Take 1 tablet (300 mg total) by mouth daily.  90 tablet  4  . aspirin 81 MG tablet Take 81 mg by mouth daily.        . colchicine 0.6 MG tablet Take 1 tablet (0.6 mg total) by mouth 2 (two) times daily as needed.  60 tablet  3  . diltiazem (CARDIZEM CD) 180 MG 24 hr capsule TAKE 1 CAPSULE (180 MG TOTAL) BY MOUTH DAILY.  90 capsule  4  . finasteride (PROSCAR) 5 MG tablet Take 1 tablet (5 mg total) by mouth daily.  90 tablet  1  . lidocaine (LIDODERM) 5 % Place 1 patch onto the skin  daily. Remove & Discard patch within 12 hours or as directed by MD  30 patch  0  . meloxicam (MOBIC) 15 MG tablet TAKE 1 TABLET BY MOUTH EVERY DAY  30 tablet  0  . metoprolol tartrate (LOPRESSOR) 25 MG tablet TAKE 1 TABLET (25 MG TOTAL) BY MOUTH 2 (TWO) TIMES DAILY.  180 tablet  3  . PROVENTIL HFA 108 (90 BASE) MCG/ACT inhaler       . simvastatin (ZOCOR) 10 MG tablet TAKE 1 TABLET (10 MG TOTAL) BY MOUTH AT BEDTIME.  90 tablet  3  . terazosin (HYTRIN) 10 MG capsule Take 1 capsule (10 mg total) by mouth at bedtime.  90 capsule  4  . traMADol (ULTRAM) 50 MG tablet TAKE 1 TABLET BY MOUTH TWICE A DAY AS NEEDED WITH A MAX OF 2 TABS/DAY  60 tablet  0   No current facility-administered medications for this visit.    Physical Exam BP 150/78  Pulse 70  Resp 20  Ht 5\' 9"  (1.753 m)  Wt 180 lb (81.647 kg)  BMI 26.57 kg/m2  SpO2 98% Well-appearing 73 year old male in no acute distress No cervical or supraclavicular adenopathy Lungs with equal breath sounds bilaterally Cardiac regular rate and rhythm normal S1 and S2  Diagnostic Tests: CT chest 04/20/2013 CT CHEST WITHOUT CONTRAST  TECHNIQUE:  Multidetector CT imaging of the chest was performed following the  standard protocol without IV contrast.  COMPARISON: CT scans 01/05/2013 and 09/30/2012.  FINDINGS:  The chest wall is unremarkable and stable.  The heart is normal in size. No pericardial effusion. No mediastinal  or hilar mass or adenopathy. Stable small scattered lymph nodes.  Stable aortic and branch vessel calcifications. The esophagus is  grossly normal.  Examination of the lung parenchyma demonstrates stable emphysematous  changes. Small bulb long nodular density in the right upper lobe on  image number 28 measures approximately 7 mm and is unchanged. It is  most likely a lymph node. The superior segment left lower lobe  nodule on image number 46 measures 6.6 x 6.8 mm and is stable. Small  nodules along the right major fissure  on image number 23 in 24 are  unchanged and likely small lymph nodes. Subpleural pulmonary nodule  in the right upper lobe on image number 36 measures 5 mm and is  unchanged. No new lesions. No acute pulmonary findings.  The upper abdomen is unremarkable and stable. Stable right adrenal  gland adenoma and simple hepatic cysts.  IMPRESSION:  Stable bilateral pulmonary nodules, likely benign. Recommend a  followup noncontrast chest CT in 12 months to document stability.  Electronically Signed  By: Kalman Jewels M.D.  On: 04/20/2013 15:05   Impression: 73 year old gentleman with a history of heavy tobacco abuse which is ongoing. I once again counseled about the importance of tobacco cessation. He has multiple small nodules the most worrisome being the left lower lobe nodule. These have been stable over the past 6 months.  I recommended to Greg Hughes that we repeat a CT in 6 months  Plan:  Return in 6 months with CT of chest

## 2013-05-10 ENCOUNTER — Other Ambulatory Visit: Payer: Self-pay | Admitting: Internal Medicine

## 2013-05-10 NOTE — Telephone Encounter (Signed)
Ok to refill 

## 2013-05-11 ENCOUNTER — Other Ambulatory Visit: Payer: Self-pay | Admitting: Internal Medicine

## 2013-05-11 NOTE — Telephone Encounter (Signed)
May refill x 1 month. Needs follow up

## 2013-05-11 NOTE — Telephone Encounter (Signed)
Okay to refill? 

## 2013-05-12 ENCOUNTER — Other Ambulatory Visit: Payer: Self-pay | Admitting: *Deleted

## 2013-05-12 ENCOUNTER — Encounter: Payer: Self-pay | Admitting: *Deleted

## 2013-05-12 NOTE — Telephone Encounter (Signed)
Sent mychart message to inform pt that an appointment is needed for further refills

## 2013-05-14 ENCOUNTER — Encounter: Payer: Self-pay | Admitting: *Deleted

## 2013-07-06 ENCOUNTER — Ambulatory Visit (INDEPENDENT_AMBULATORY_CARE_PROVIDER_SITE_OTHER): Payer: Commercial Managed Care - HMO | Admitting: Internal Medicine

## 2013-07-06 ENCOUNTER — Encounter: Payer: Self-pay | Admitting: Internal Medicine

## 2013-07-06 VITALS — BP 112/60 | HR 65 | Temp 98.2°F | Ht 69.0 in | Wt 186.2 lb

## 2013-07-06 DIAGNOSIS — M545 Low back pain, unspecified: Secondary | ICD-10-CM

## 2013-07-06 MED ORDER — TRAMADOL HCL 50 MG PO TABS: 50.0000 mg | ORAL_TABLET | Freq: Two times a day (BID) | ORAL | Status: DC

## 2013-07-06 MED ORDER — HYDROCODONE-ACETAMINOPHEN 5-325 MG PO TABS: 1.0000 | ORAL_TABLET | Freq: Three times a day (TID) | ORAL | Status: DC | PRN

## 2013-07-06 NOTE — Progress Notes (Signed)
Pre visit review using our clinic review tool, if applicable. No additional management support is needed unless otherwise documented below in the visit note. 

## 2013-07-06 NOTE — Progress Notes (Signed)
Subjective:    Patient ID: Greg Hughes, male    DOB: Dec 27, 1940, 73 y.o.   MRN: [id number]  HPI 73YO male present for follow up.  Chronic back pain - Recently, symptoms of lower back pain have been worsening. Described as sharp and aching at times. Radiates down back of both legs. Feels as if legs will give out at times. No loss of control of bowel or bladder. Pain rated 6/10 at baseline and up to 9-10/10 at worst. Taking Ibuprofen with minimal improvement. Took Meloxicam in the past with no improvement. Using Tramadol 50mg  bid with little improvement. Last MRI lumbar spine 2013 showed diffuse degenerative changes and multilevel cord and nerve impingement. Previously followed with Dr. Arnoldo Morale in [place].  Review of Systems  Constitutional: Negative for fever, chills, activity change, appetite change, fatigue and unexpected weight change.  Eyes: Negative for visual disturbance.  Respiratory: Negative for cough and shortness of breath.   Cardiovascular: Negative for chest pain, palpitations and leg swelling.  Gastrointestinal: Negative for abdominal pain and abdominal distention.  Genitourinary: Negative for dysuria, urgency and difficulty urinating.  Musculoskeletal: Positive for arthralgias, back pain, gait problem and myalgias.  Skin: Negative for color change and rash.  Neurological: Positive for weakness. Negative for numbness.  Hematological: Negative for adenopathy.  Psychiatric/Behavioral: Negative for sleep disturbance and dysphoric mood. The patient is not nervous/anxious.        Objective:    BP 112/60  Pulse 65  Temp(Src) 98.2 F (36.8 C) (Oral)  Ht 5\' 9"  (1.753 m)  Wt 186 lb 4 oz (84.482 kg)  BMI 27.49 kg/m2  SpO2 95% Physical Exam  Constitutional: He is oriented to person, place, and time. He appears well-developed and well-nourished. No distress.  HENT:  Head: Normocephalic and atraumatic.  Right Ear: External ear normal.  Left Ear: External ear normal.  Nose:  Nose normal.  Mouth/Throat: Oropharynx is clear and moist. No oropharyngeal exudate.  Eyes: Conjunctivae and EOM are normal. Pupils are equal, round, and reactive to light. Right eye exhibits no discharge. Left eye exhibits no discharge. No scleral icterus.  Neck: Normal range of motion. Neck supple. No tracheal deviation present. No thyromegaly present.  Cardiovascular: Normal rate, regular rhythm and normal heart sounds.  Exam reveals no gallop and no friction rub.   No murmur heard. Pulmonary/Chest: Effort normal and breath sounds normal. No accessory muscle usage. Not tachypneic. No respiratory distress. He has no decreased breath sounds. He has no wheezes. He has no rhonchi. He has no rales. He exhibits no tenderness.  Musculoskeletal: He exhibits no edema.       Lumbar back: He exhibits decreased range of motion, tenderness and pain. He exhibits no bony tenderness.  Lymphadenopathy:    He has no cervical adenopathy.  Neurological: He is alert and oriented to person, place, and time. No cranial nerve deficit. Coordination normal.  Skin: Skin is warm and dry. No rash noted. He is not diaphoretic. No erythema. No pallor.  Psychiatric: He has a normal mood and affect. His behavior is normal. Judgment and thought content normal.          Assessment & Plan:   Problem List Items Addressed This Visit   BACK PAIN, LUMBAR, CHRONIC - Primary     Severe, chronic lumbar spine pain. 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 Will set up follow up with [facility]. Will increase Tramadol to 50-100mg  tid and add prn Hydrocodone for severe pain only at  night.    Relevant Medications      HYDROcodone-acetaminophen (NORCO/VICODIN) 5-325 MG per tablet      traMADol (ULTRAM) 50 MG tablet   Other Relevant Orders      Ambulatory referral to Neurosurgery       Return in about 3 months (around 10/06/2013) for Recheck, Wellness Visit.

## 2013-07-06 NOTE — Assessment & Plan Note (Signed)
Severe, chronic lumbar spine pain. 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 Will set up follow up with [facility]. Will increase Tramadol to 50-100mg  tid and add prn Hydrocodone for severe pain only at night.

## 2013-07-16 ENCOUNTER — Other Ambulatory Visit: Payer: Self-pay | Admitting: Internal Medicine

## 2013-07-22 ENCOUNTER — Encounter: Payer: Self-pay | Admitting: Internal Medicine

## 2013-07-30 ENCOUNTER — Other Ambulatory Visit: Payer: Self-pay | Admitting: Internal Medicine

## 2013-07-30 DIAGNOSIS — M545 Low back pain, unspecified: Secondary | ICD-10-CM

## 2013-08-27 ENCOUNTER — Telehealth: Payer: Self-pay | Admitting: Internal Medicine

## 2013-08-27 ENCOUNTER — Ambulatory Visit: Payer: Self-pay | Admitting: Internal Medicine

## 2013-08-27 NOTE — Telephone Encounter (Signed)
MRI lumbar spine showed L3-4 disc bulge with compression of the right L4 nerve root. I would recommend consultation with neurosurgery asap. Does he have a neurosurgeon that he is established with?

## 2013-08-27 NOTE — Telephone Encounter (Signed)
Notified pt of results 

## 2013-08-27 NOTE — Telephone Encounter (Signed)
Please make the referral with Dr Jinny Sanders at [facility], please refer to referral notes in chart

## 2013-09-07 ENCOUNTER — Encounter: Payer: Self-pay | Admitting: Internal Medicine

## 2013-09-28 ENCOUNTER — Other Ambulatory Visit: Payer: Self-pay | Admitting: *Deleted

## 2013-09-28 DIAGNOSIS — R918 Other nonspecific abnormal finding of lung field: Secondary | ICD-10-CM

## 2013-10-07 DIAGNOSIS — M48061 Spinal stenosis, lumbar region without neurogenic claudication: Secondary | ICD-10-CM | POA: Insufficient documentation

## 2013-10-12 ENCOUNTER — Ambulatory Visit (INDEPENDENT_AMBULATORY_CARE_PROVIDER_SITE_OTHER): Payer: Commercial Managed Care - HMO | Admitting: Internal Medicine

## 2013-10-12 ENCOUNTER — Encounter: Payer: Self-pay | Admitting: Internal Medicine

## 2013-10-12 ENCOUNTER — Other Ambulatory Visit (HOSPITAL_COMMUNITY)
Admission: RE | Admit: 2013-10-12 | Discharge: 2013-10-12 | Disposition: A | Discharge status: Home / Self Care | Payer: Medicare HMO | Source: Ambulatory Visit | Attending: Internal Medicine | Admitting: Internal Medicine

## 2013-10-12 VITALS — BP 110/66 | HR 58 | Temp 97.6°F | Resp 12 | Ht 69.25 in | Wt 187.5 lb

## 2013-10-12 DIAGNOSIS — R310 Gross hematuria: Secondary | ICD-10-CM | POA: Insufficient documentation

## 2013-10-12 DIAGNOSIS — N401 Enlarged prostate with lower urinary tract symptoms: Secondary | ICD-10-CM

## 2013-10-12 DIAGNOSIS — I10 Essential (primary) hypertension: Secondary | ICD-10-CM

## 2013-10-12 DIAGNOSIS — M545 Low back pain, unspecified: Secondary | ICD-10-CM

## 2013-10-12 DIAGNOSIS — Z0000 Encounter for general adult medical examination without abnormal findings: Secondary | ICD-10-CM

## 2013-10-12 DIAGNOSIS — N138 Other obstructive and reflux uropathy: Secondary | ICD-10-CM

## 2013-10-12 DIAGNOSIS — Z125 Encounter for screening for malignant neoplasm of prostate: Secondary | ICD-10-CM

## 2013-10-12 DIAGNOSIS — Z23 Encounter for immunization: Secondary | ICD-10-CM

## 2013-10-12 LAB — URINALYSIS, ROUTINE W REFLEX MICROSCOPIC
BILIRUBIN URINE: NEGATIVE
KETONES UR: NEGATIVE
LEUKOCYTES UA: NEGATIVE
NITRITE: NEGATIVE
PH: 5.5 (ref 5.0–8.0)
Specific Gravity, Urine: 1.02 (ref 1.000–1.030)
Total Protein, Urine: NEGATIVE
UROBILINOGEN UA: 0.2 (ref 0.0–1.0)
Urine Glucose: NEGATIVE
WBC, UA: NONE SEEN (ref 0–?)

## 2013-10-12 LAB — COMPREHENSIVE METABOLIC PANEL
ALT: 30 U/L (ref 0–53)
AST: 24 U/L (ref 0–37)
Albumin: 3.9 g/dL (ref 3.5–5.2)
Alkaline Phosphatase: 98 U/L (ref 39–117)
BILIRUBIN TOTAL: 0.5 mg/dL (ref 0.2–1.2)
BUN: 17 mg/dL (ref 6–23)
CALCIUM: 10 mg/dL (ref 8.4–10.5)
CHLORIDE: 105 meq/L (ref 96–112)
CO2: 28 meq/L (ref 19–32)
CREATININE: 1.1 mg/dL (ref 0.4–1.5)
GFR: 72.74 mL/min (ref >60.00)
GLUCOSE: 94 mg/dL (ref 70–99)
Potassium: 4.9 mEq/L (ref 3.5–5.1)
Sodium: 141 mEq/L (ref 135–145)
Total Protein: 7.4 g/dL (ref 6.0–8.3)

## 2013-10-12 LAB — LIPID PANEL
CHOLESTEROL: 141 mg/dL (ref 0–200)
HDL: 43.4 mg/dL (ref >39.00)
LDL Cholesterol: 76 mg/dL (ref 0–99)
NONHDL: 97.6
Total CHOL/HDL Ratio: 3
Triglycerides: 106 mg/dL (ref 0.0–149.0)
VLDL: 21.2 mg/dL (ref 0.0–40.0)

## 2013-10-12 LAB — CBC WITH DIFFERENTIAL/PLATELET
BASOS PCT: 0.7 % (ref 0.0–3.0)
Basophils Absolute: 0.1 10*3/uL (ref 0.0–0.1)
EOS ABS: 0.1 10*3/uL (ref 0.0–0.7)
Eosinophils Relative: 1.1 % (ref 0.0–5.0)
HEMATOCRIT: 47.5 % (ref 39.0–52.0)
HEMOGLOBIN: 16 g/dL (ref 13.0–17.0)
Lymphocytes Relative: 34.2 % (ref 12.0–46.0)
Lymphs Abs: 3.9 10*3/uL (ref 0.7–4.0)
MCHC: 33.8 g/dL (ref 30.0–36.0)
MCV: 91.8 fl (ref 78.0–100.0)
Monocytes Absolute: 0.5 10*3/uL (ref 0.1–1.0)
Monocytes Relative: 4.8 % (ref 3.0–12.0)
NEUTROS ABS: 6.7 10*3/uL (ref 1.4–7.7)
Neutrophils Relative %: 59.2 % (ref 43.0–77.0)
Platelets: 219 10*3/uL (ref 150.0–400.0)
RBC: 5.17 Mil/uL (ref 4.22–5.81)
RDW: 14.7 % (ref 11.5–15.5)
WBC: 11.4 10*3/uL — ABNORMAL HIGH (ref 4.0–10.5)

## 2013-10-12 LAB — POCT URINALYSIS DIPSTICK
Bilirubin, UA: NEGATIVE
Glucose, UA: NEGATIVE
KETONES UA: NEGATIVE
LEUKOCYTES UA: NEGATIVE
NITRITE UA: NEGATIVE
PH UA: 5.5
Protein, UA: NEGATIVE
Spec Grav, UA: 1.015
Urobilinogen, UA: 0.2

## 2013-10-12 LAB — PSA, MEDICARE: PSA: 2.41 ng/ml (ref 0.10–4.00)

## 2013-10-12 LAB — MICROALBUMIN / CREATININE URINE RATIO
Creatinine,U: 143 mg/dL
MICROALB/CREAT RATIO: 1.3 mg/g (ref 0.0–30.0)
Microalb, Ur: 1.9 mg/dL (ref 0.0–1.9)

## 2013-10-12 MED ORDER — HYDROCODONE-ACETAMINOPHEN 5-325 MG PO TABS: 1.0000 | ORAL_TABLET | Freq: Three times a day (TID) | ORAL | Status: DC | PRN

## 2013-10-12 MED ORDER — TRAMADOL HCL 50 MG PO TABS: 50.0000 mg | ORAL_TABLET | Freq: Two times a day (BID) | ORAL | Status: DC

## 2013-10-12 NOTE — Addendum Note (Signed)
Addended by: Wynonia Lawman E on: 10/12/2013 10:36 AM   Modules accepted: Orders

## 2013-10-12 NOTE — Progress Notes (Signed)
Pre visit review using our clinic review tool, if applicable. No additional management support is needed unless otherwise documented below in the visit note. 

## 2013-10-12 NOTE — Progress Notes (Signed)
Subjective:    Patient ID: Greg Hughes, male    DOB: January 16, 1941, 73 y.o.   MRN: [id number]  HPI The patient is here for annual Medicare wellness examination and management of other chronic and acute problems.   The risk factors are reflected in the social history.  The roster of all physicians providing medical care to patient - is listed in the Snapshot section of the chart.  Activities of daily living:  The patient is 100% independent in all ADLs: dressing, toileting, feeding as well as independent mobility. Lives in [place] with wife and son in a two story home. Has hardwood and carpeted floors. Has dog and 2 cats in home.  Home safety : The patient has smoke detectors in the home. They wear seatbelts.  There are no firearms at home. There is no violence in the home.   There is no risks for hepatitis, STDs or HIV. There is no history of blood transfusion. They have no travel history to infectious disease endemic areas of the world.  The patient has not seen their dentist in the last six month. Wears partial dentures.   They have seen their eye doctor in the last year. Opthalmology - Dr. Chong Sicilian  No issues with hearing. They have deferred audiologic testing in the last year.    They do not  have excessive sun exposure. Discussed the need for sun protection: hats, long sleeves and use of sunscreen if there is significant sun exposure. Dermatologist - none  Cardiothoracic Surgeon - Dr. Roxan Hockey  Diet: the importance of a healthy diet is discussed. They do have a healthy diet.  The benefits of regular aerobic exercise were discussed.  Exercise is limited by back pain.  Depression screen: there are no signs or vegative symptoms of depression- irritability, change in appetite, anhedonia, sadness/tearfullness.  Cognitive assessment: the patient manages all their financial and personal affairs and is actively engaged. They could relate day,date,year and events.  The  following portions of the patient's history were reviewed and updated as appropriate: allergies, current medications, past family history, past medical history,  past surgical history, past social history  and problem list.  HCPOA - wife, Noah Lembke  Visual acuity was not assessed per patient preference since he has regular follow up with his ophthalmologist. Hearing and body mass index were assessed and reviewed.   During the course of the visit the patient was educated and counseled about appropriate screening and preventive services including : fall prevention , diabetes screening, nutrition counseling, colorectal cancer screening, and recommended immunizations.     Review of Systems  Constitutional: Negative for fever, chills, activity change, appetite change, fatigue and unexpected weight change.  Eyes: Negative for visual disturbance.  Respiratory: Negative for cough and shortness of breath.   Cardiovascular: Negative for chest pain, palpitations and leg swelling.  Gastrointestinal: Negative for nausea, vomiting, abdominal pain, diarrhea, constipation, blood in stool and abdominal distention.  Genitourinary: Positive for frequency (wakes 2-3 times at night to urinate). Negative for dysuria, urgency, decreased urine volume, difficulty urinating and testicular pain.  Musculoskeletal: Negative for arthralgias and gait problem.  Skin: Negative for color change and rash.  Hematological: Negative for adenopathy.  Psychiatric/Behavioral: Negative for sleep disturbance and dysphoric mood. The patient is not nervous/anxious.        Objective:    BP 110/66  Pulse 58  Temp(Src) 97.6 F (36.4 C) (Oral)  Resp 12  Ht 5' 9.25" (1.759 m)  Wt 187 lb 8 oz (  85.049 kg)  BMI 27.49 kg/m2  SpO2 97% Physical Exam  Constitutional: He is oriented to person, place, and time. He appears well-developed and well-nourished. No distress.  HENT:  Head: Normocephalic and atraumatic.  Right Ear: External  ear normal.  Left Ear: External ear normal.  Nose: Nose normal.  Mouth/Throat: Oropharynx is clear and moist. No oropharyngeal exudate.  Eyes: Conjunctivae and EOM are normal. Pupils are equal, round, and reactive to light. Right eye exhibits no discharge. Left eye exhibits no discharge. No scleral icterus.  Neck: Normal range of motion. Neck supple. No tracheal deviation present. No thyromegaly present.  Cardiovascular: Normal rate, regular rhythm and normal heart sounds.  Exam reveals no gallop and no friction rub.   No murmur heard. Pulmonary/Chest: Effort normal and breath sounds normal. No respiratory distress. He has no wheezes. He has no rales. He exhibits no tenderness.  Abdominal: Soft. Bowel sounds are normal. He exhibits no distension and no mass. There is no tenderness. There is no rebound and no guarding.  Musculoskeletal: Normal range of motion. He exhibits no edema.  Lymphadenopathy:    He has no cervical adenopathy.  Neurological: He is alert and oriented to person, place, and time. No cranial nerve deficit. Coordination normal.  Skin: Skin is warm and dry. No rash noted. He is not diaphoretic. No erythema. No pallor.  Psychiatric: He has a normal mood and affect. His behavior is normal. Judgment and thought content normal.          Assessment & Plan:   Problem List Items Addressed This Visit     Unprioritized   BACK PAIN, LUMBAR, CHRONIC     Scheduled for lumbar fusion. Will follow. Continue prn Hydrocodone and Tramadol as needed for pain.    Relevant Medications      traMADol (ULTRAM) 50 MG tablet      HYDROcodone-acetaminophen (NORCO/VICODIN) 5-325 MG per tablet   BENIGN PROSTATIC HYPERTROPHY, WITH URINARY OBSTRUCTION     Chronic BPH. Will check PSA today and have him re-establish care with urology. We discussed that symptoms may be worsened with use of Hydrocodone.    Relevant Orders      Ambulatory referral to Urology   Medicare annual wellness visit,  subsequent - Primary     General medical exam normal today. Encouraged smoking cessation. Will check labs including CMP and lipids as well as urine microalbumin. Will check PSA today. Discussed limitations of PSA testing. Will also set up follow up with urology.Encouraged healthy diet and regular physical activity. Note that exercise is limited because of low back pain.  Prevnar today.      Relevant Orders      PSA, Medicare      CBC with Differential      Comprehensive metabolic panel      Lipid panel      Microalbumin / creatinine urine ratio      POCT Urinalysis Dipstick       Return in about 6 months (around 04/14/2014) for Recheck.

## 2013-10-12 NOTE — Assessment & Plan Note (Signed)
Scheduled for lumbar fusion. Will follow. Continue prn Hydrocodone and Tramadol as needed for pain.

## 2013-10-12 NOTE — Assessment & Plan Note (Signed)
Chronic BPH. Will check PSA today and have him re-establish care with urology. We discussed that symptoms may be worsened with use of Hydrocodone.

## 2013-10-12 NOTE — Assessment & Plan Note (Signed)
General medical exam normal today. Encouraged smoking cessation. Will check labs including CMP and lipids as well as urine microalbumin. Will check PSA today. Discussed limitations of PSA testing. Will also set up follow up with urology.Encouraged healthy diet and regular physical activity. Note that exercise is limited because of low back pain.  Prevnar today.

## 2013-10-12 NOTE — Addendum Note (Signed)
Addended by: Karlene Einstein D on: 10/12/2013 12:03 PM   Modules accepted: Orders

## 2013-10-12 NOTE — Patient Instructions (Signed)

## 2013-10-14 LAB — URINE CULTURE
Colony Count: NO GROWTH
Organism ID, Bacteria: NO GROWTH

## 2013-10-15 ENCOUNTER — Other Ambulatory Visit: Payer: Self-pay | Admitting: Internal Medicine

## 2013-10-22 ENCOUNTER — Telehealth: Payer: Self-pay | Admitting: *Deleted

## 2013-10-22 NOTE — Telephone Encounter (Signed)
Romie Minus, supervisor at [facility], states that pt is already has nodule that are being followed at this time, he has a routine chest w/out scheduled for tues sept 8th by Dr Wallene Huh. Romie Minus states that since there are nodules that are currently being followed we are unable proceed with the Lung Cancer Screening.  Pt will be eligible for the screening again next yr

## 2013-10-26 ENCOUNTER — Other Ambulatory Visit: Payer: Self-pay | Admitting: *Deleted

## 2013-10-26 ENCOUNTER — Telehealth: Payer: Self-pay

## 2013-10-26 ENCOUNTER — Ambulatory Visit (INDEPENDENT_AMBULATORY_CARE_PROVIDER_SITE_OTHER): Payer: Commercial Managed Care - HMO | Admitting: Thoracic Surgery (Cardiothoracic Vascular Surgery)

## 2013-10-26 ENCOUNTER — Encounter: Payer: Self-pay | Admitting: Thoracic Surgery (Cardiothoracic Vascular Surgery)

## 2013-10-26 ENCOUNTER — Ambulatory Visit
Admission: RE | Admit: 2013-10-26 | Discharge: 2013-10-26 | Disposition: A | Discharge status: Home / Self Care | Payer: Commercial Managed Care - HMO | Source: Ambulatory Visit | Attending: Internal Medicine | Admitting: Internal Medicine

## 2013-10-26 VITALS — BP 135/74 | HR 69 | Ht 69.0 in | Wt 187.0 lb

## 2013-10-26 DIAGNOSIS — R918 Other nonspecific abnormal finding of lung field: Secondary | ICD-10-CM

## 2013-10-26 DIAGNOSIS — N2889 Other specified disorders of kidney and ureter: Secondary | ICD-10-CM

## 2013-10-26 DIAGNOSIS — N289 Disorder of kidney and ureter, unspecified: Secondary | ICD-10-CM

## 2013-10-26 DIAGNOSIS — R911 Solitary pulmonary nodule: Secondary | ICD-10-CM

## 2013-10-26 NOTE — Telephone Encounter (Signed)
The patient called needing to discuss information he found out from a pervious doctor's appointment

## 2013-10-26 NOTE — Progress Notes (Signed)
HPI:  [name] returns for a scheduled 6 month followup visit.  He is a 73 year old nonsmoker who was found in August of 2014 have multiple lung nodules bilaterally. The largest of these was 5 x 7 mm in the left lower lobe. They have been followed radiographically. He was last seen in the office in March. There was no change in the nodules at that time. He now returns for 6 month followup.  He says that his been doing well from a pulmonary standpoint. He does continue to smoke. His main problem has been back pain. He is scheduled to have back surgery on September 25. His weight has been stable. He has an occasional cough, no hemoptysis.  Past Medical History  Diagnosis Date  . Hypertension   . GERD (gastroesophageal reflux disease)   . Hyperlipidemia   . History of colon polyps   . BPH (benign prostatic hypertrophy)   . Microscopic hematuria   . ED (erectile dysfunction)   . Tobacco abuse   . A-fib 2013  . Lumbar herniated disc   . Spinal stenosis   . CAD (coronary artery disease)       Current Outpatient Prescriptions  Medication Sig Dispense Refill  . allopurinol (ZYLOPRIM) 300 MG tablet TAKE 1 TABLET BY MOUTH EVERY DAY  90 tablet  3  . aspirin 81 MG tablet Take 81 mg by mouth daily.        . colchicine 0.6 MG tablet Take 1 tablet (0.6 mg total) by mouth 2 (two) times daily as needed.  60 tablet  3  . diltiazem (CARDIZEM CD) 180 MG 24 hr capsule TAKE 1 CAPSULE (180 MG TOTAL) BY MOUTH DAILY.  90 capsule  4  . finasteride (PROSCAR) 5 MG tablet TAKE 1 TABLET BY MOUTH EVERY DAY  90 tablet  1  . metoprolol tartrate (LOPRESSOR) 25 MG tablet TAKE 1 TABLET (25 MG TOTAL) BY MOUTH 2 (TWO) TIMES DAILY.  180 tablet  3  . simvastatin (ZOCOR) 10 MG tablet TAKE 1 TABLET (10 MG TOTAL) BY MOUTH AT BEDTIME.  90 tablet  3  . terazosin (HYTRIN) 10 MG capsule TAKE ONE CAPSULE BY MOUTH AT BEDTIME  90 capsule  3  . HYDROcodone-acetaminophen (NORCO/VICODIN) 5-325 MG per tablet Take 1-2 tablets by  mouth 3 (three) times daily as needed for moderate pain or severe pain.  120 tablet  0  . traMADol (ULTRAM) 50 MG tablet Take 1-2 tablets (50-100 mg total) by mouth 2 (two) times daily.  120 tablet  3   No current facility-administered medications for this visit.    Physical Exam BP 135/74  Pulse 69  Ht 5\' 9"  (1.753 m)  Wt 187 lb (84.823 kg)  BMI 27.60 kg/m2  SpO2 96% Well-appearing 73 year old male in no acute distress Well-developed and well-nourished Oriented x3 with no focal deficits Lungs clear with equal breath sounds bilaterally Cardiac regular rate and rhythm normal S1 and S2  Diagnostic Tests: CT CHEST WITHOUT CONTRAST  TECHNIQUE:  Multidetector CT imaging of the chest was performed following the  standard protocol without IV contrast.  COMPARISON: Chest CT 04/20/2013.  FINDINGS:  Mediastinum: Heart size is normal. There is no significant  pericardial fluid, thickening or pericardial calcification. There is  atherosclerosis of the thoracic aorta, the great vessels of the  mediastinum and the coronary arteries, including calcified  atherosclerotic plaque in the left main, left anterior descending,  left circumflex and right coronary arteries. No pathologically  enlarged mediastinal or hilar lymph  nodes. Please note that accurate  exclusion of hilar adenopathy is limited on noncontrast CT scans.  Esophagus is unremarkable in appearance.  Lungs/Pleura: Tiny 5 mm nodule in the right upper lobe (image 30 of  series 4) is slightly smaller and less well-defined than the prior  examination, presumably benign. 7 x 6 mm left lower lobe nodule  (image 46 of series 4) is also unchanged. Tiny ill-defined 2-3 mm  nodules associated with the superior aspect of the right major  fissure (images 24 and 25 of series 4) are unchanged. 4 mm  subpleural nodule in the periphery of the right upper lobe (image 38  of series 4) is slightly smaller than prior studies, presumably a   subpleural lymph node. No other new or enlarging suspicious  appearing pulmonary nodules or masses are otherwise noted. No acute  consolidative airspace disease. No pleural effusions. There is a  background of very mild centrilobular emphysema.  Upper Abdomen: There is a masslike protrusion from the anterior  aspect of the upper pole of the left kidney best appreciated on  image 72 of series 3 where it measures approximately 4 cm. This is  incompletely characterized on today's noncontrast CT examination.  Several low-attenuation lesions are again noted in the liver,  similar in size, number and distribution to the prior examinations  dating back to 01/04/2005, with the largest lesion measuring 1.9 cm  in segment 3; these are incompletely characterized, but  statistically favored to represent small cysts.  Musculoskeletal: There are no aggressive appearing lytic or blastic  lesions noted in the visualized portions of the skeleton.  IMPRESSION:  1. All previously noted pulmonary nodules are either unchanged or  slightly smaller compared to prior examinations dating back to  09/30/2012. At this point, these can be considered benign and do not  require imaging followup.  2. However, incidental imaging of the upper pole of the left kidney  demonstrates a 4 cm mass like protrusion. This is concerning for  potential neoplasm, but is incompletely characterized on today's  noncontrast CT examination, and warrants further characterization,  preferably with contrast-enhanced MRI of the abdomen.  3. Atherosclerosis, including left main and 3 vessel coronary artery  disease. Assessment for potential risk factor modification, dietary  therapy or pharmacologic therapy may be warranted, if clinically  indicated.  4. Mild centrilobular emphysema.  These results will be called to the ordering clinician or  representative by the Radiologist Assistant, and communication  documented in the PACS or  zVision Dashboard.  Electronically Signed  By: Vinnie Langton M.D.  On: 10/26/2013 09:59   Impression: 73 year old smoker with multiple small lung nodules bilaterally. The nodules have been stable for a year. I do think we should repeat his scan in the year to make sure that we have 2 years of followup on these lesions.  Of greater concern today is the finding of a possible left renal mass on the CT. Radiology recommended a contrast-enhanced MR of the abdomen to further characterize this lesion. I am going to go ahead and schedule that for him. We will also set him up with urology consult to review that scan.  He thinks he has seen a urologist here in town some point, but cannot remember his name.  The importance of smoking cessation was again reviewed.  Plan:  1. MR of the abdomen with IV contrast to evaluate left renal mass  2. Urology consult  3. Return in one year with CT of chest

## 2013-10-27 NOTE — Telephone Encounter (Signed)
OK. Please set him up an appointment

## 2013-10-30 ENCOUNTER — Ambulatory Visit
Admission: RE | Admit: 2013-10-30 | Discharge: 2013-10-30 | Disposition: A | Discharge status: Home / Self Care | Payer: Commercial Managed Care - HMO | Source: Ambulatory Visit | Attending: Thoracic Surgery (Cardiothoracic Vascular Surgery) | Admitting: Thoracic Surgery (Cardiothoracic Vascular Surgery)

## 2013-10-30 DIAGNOSIS — R911 Solitary pulmonary nodule: Secondary | ICD-10-CM

## 2013-10-30 DIAGNOSIS — N2889 Other specified disorders of kidney and ureter: Secondary | ICD-10-CM

## 2013-10-30 MED ORDER — GADOBENATE DIMEGLUMINE 529 MG/ML IV SOLN
17.0000 mL | Freq: Once | INTRAVENOUS | Status: AC | PRN
  Administered 2013-10-30: 17 mL via INTRAVENOUS

## 2013-11-01 ENCOUNTER — Encounter: Payer: Self-pay | Admitting: Cardiovascular Disease

## 2013-11-01 ENCOUNTER — Ambulatory Visit (INDEPENDENT_AMBULATORY_CARE_PROVIDER_SITE_OTHER): Payer: Commercial Managed Care - HMO | Admitting: Cardiovascular Disease

## 2013-11-01 ENCOUNTER — Telehealth: Payer: Self-pay | Admitting: *Deleted

## 2013-11-01 VITALS — BP 122/78 | HR 61 | Ht 69.0 in | Wt 189.0 lb

## 2013-11-01 DIAGNOSIS — F17200 Nicotine dependence, unspecified, uncomplicated: Secondary | ICD-10-CM | POA: Diagnosis not present

## 2013-11-01 DIAGNOSIS — J4489 Other specified chronic obstructive pulmonary disease: Secondary | ICD-10-CM | POA: Diagnosis not present

## 2013-11-01 DIAGNOSIS — J449 Chronic obstructive pulmonary disease, unspecified: Secondary | ICD-10-CM

## 2013-11-01 DIAGNOSIS — M5430 Sciatica, unspecified side: Secondary | ICD-10-CM | POA: Diagnosis not present

## 2013-11-01 DIAGNOSIS — I4891 Unspecified atrial fibrillation: Secondary | ICD-10-CM | POA: Diagnosis not present

## 2013-11-01 DIAGNOSIS — M5440 Lumbago with sciatica, unspecified side: Secondary | ICD-10-CM

## 2013-11-01 DIAGNOSIS — E785 Hyperlipidemia, unspecified: Secondary | ICD-10-CM

## 2013-11-01 DIAGNOSIS — Z01810 Encounter for preprocedural cardiovascular examination: Secondary | ICD-10-CM

## 2013-11-01 NOTE — Patient Instructions (Signed)
You are doing well. No medication changes were made.  Please call us if you have new issues that need to be addressed before your next appt.  Your physician wants you to follow-up in: 12 months.  You will receive a reminder letter in the mail two months in advance. If you don't receive a letter, please call our office to schedule the follow-up appointment. 

## 2013-11-01 NOTE — Assessment & Plan Note (Signed)
Acceptable risk for upcoming back surgery at [facility] at the end of September 2015. No further testing needed. Would recommend minimizing IV fluids through the operative and postoperative period in an effort to minimize his risk of arrhythmia.

## 2013-11-01 NOTE — Progress Notes (Signed)
Patient ID: Greg Hughes, male    DOB: 08/05/1940, 73 y.o.   MRN: [id number]  HPI Comments: Greg Hughes is a very pleasant 73 year old gentleman with a long history of smoking who continues to smoke one pack per day, hypertension who presented to the hospital in 2013, [place]  with palpitations and shortness of breath.  found to be in atrial fibrillation with RVR, rate 150 beats per minute. He was given IV Cardizem in the emergency room and he converted to normal sinus rhythm during his hospital course. He was discharged on Cardizem 120 mg daily. He presents for routine followup  On today's visit, he is taking diltiazem daily, one half metoprolol twice a day Overall he feels well with no complaints. He is scheduled for back surgery in the future at [place] having spinal stenosis, numbness in his legs  He does have rare episodes of atrial fibrillation lasting 20 minutes, possibly 4 episodes over the past year Continues to smoke one pack per day Recent CT scan suggesting mass in his kidney. This was not seen on MRI, only a cyst  Echocardiogram 11/08/2011 shows normal systolic function, normal left atrial size, otherwise normal study  Total cholesterol 116, LDL 45, HDL 40 EKG shows normal sinus rhythm with rate 61 beats per minute, no significant ST or T wave changes   Outpatient Encounter Prescriptions as of 11/01/2013  Medication Sig  . allopurinol (ZYLOPRIM) 300 MG tablet TAKE 1 TABLET BY MOUTH EVERY DAY  . aspirin 81 MG tablet Take 81 mg by mouth daily.    . colchicine 0.6 MG tablet Take 1 tablet (0.6 mg total) by mouth 2 (two) times daily as needed.  . diltiazem (CARDIZEM CD) 180 MG 24 hr capsule TAKE 1 CAPSULE (180 MG TOTAL) BY MOUTH DAILY.  . finasteride (PROSCAR) 5 MG tablet TAKE 1 TABLET BY MOUTH EVERY DAY  . HYDROcodone-acetaminophen (NORCO/VICODIN) 5-325 MG per tablet Take 1-2 tablets by mouth 3 (three) times daily as needed for moderate pain or severe pain.  .  metoprolol tartrate (LOPRESSOR) 25 MG tablet TAKE 1 TABLET (25 MG TOTAL) BY MOUTH 2 (TWO) TIMES DAILY.  . simvastatin (ZOCOR) 10 MG tablet TAKE 1 TABLET (10 MG TOTAL) BY MOUTH AT BEDTIME.  Marland Kitchen terazosin (HYTRIN) 10 MG capsule TAKE ONE CAPSULE BY MOUTH AT BEDTIME  . traMADol (ULTRAM) 50 MG tablet Take 1-2 tablets (50-100 mg total) by mouth 2 (two) times daily.    Review of Systems  Constitutional: Negative.   HENT: Negative.   Eyes: Negative.   Respiratory: Negative.   Cardiovascular: Negative.   Gastrointestinal: Negative.   Endocrine: Negative.   Musculoskeletal: Positive for back pain.  Skin: Negative.   Allergic/Immunologic: Negative.   Neurological: Negative.   Hematological: Negative.   Psychiatric/Behavioral: Negative.   All other systems reviewed and are negative.   BP 122/78  Pulse 61  Ht 5\' 9"  (1.753 m)  Wt 189 lb (85.73 kg)  BMI 27.90 kg/m2  Physical Exam  Nursing note and vitals reviewed. Constitutional: He is oriented to person, place, and time. He appears well-developed and well-nourished.  HENT:  Head: Normocephalic.  Nose: Nose normal.  Mouth/Throat: Oropharynx is clear and moist.  Eyes: Conjunctivae are normal. Pupils are equal, round, and reactive to light.  Neck: Normal range of motion. Neck supple. No JVD present.  Cardiovascular: Normal rate, regular rhythm, S1 normal, S2 normal, normal heart sounds and intact distal pulses.  Exam reveals no gallop and no friction rub.  No murmur heard. Pulmonary/Chest: Effort normal and breath sounds normal. No respiratory distress. He has no decreased breath sounds. He has no wheezes. He has no rales. He exhibits no tenderness.  Abdominal: Soft. Bowel sounds are normal. He exhibits no distension. There is no tenderness.  Musculoskeletal: Normal range of motion. He exhibits no edema and no tenderness.  Lymphadenopathy:    He has no cervical adenopathy.  Neurological: He is alert and oriented to person, place, and time.  Coordination normal.  Skin: Skin is warm and dry. No rash noted. No erythema.  Psychiatric: He has a normal mood and affect. His behavior is normal. Judgment and thought content normal.      Assessment and Plan

## 2013-11-01 NOTE — Assessment & Plan Note (Signed)
We have encouraged him to continue to work on weaning his cigarettes and smoking cessation. He will continue to work on this and does not want any assistance with chantix.  

## 2013-11-01 NOTE — Assessment & Plan Note (Signed)
Cholesterol is at goal on the current lipid regimen. No changes to the medications were made.  

## 2013-11-01 NOTE — Assessment & Plan Note (Signed)
Minimal shortness of breath. Recommended smoking cessation Emphysema seen on CT scan

## 2013-11-01 NOTE — Assessment & Plan Note (Signed)
Rare episodes of arrhythmia. No medication changes made at this time

## 2013-11-01 NOTE — Telephone Encounter (Signed)
Pt's wife called, thinking pt had appt today to discuss MRI results from another physician and to have Dr. Gilford Rile look at his left leg that was kicked by a horse on Friday, complaining of pain and swelling, pt declined to be seen in ED. Has back surgery scheduled 11/10/13 and wants the leg to be evaluated prior to his pre-op appointment on Wednesday. Declined appt on 11/04/13. Scheduled per Dr. Gilford Rile for tomorrow at 1:00. Pt's wife aware

## 2013-11-01 NOTE — Assessment & Plan Note (Signed)
He is scheduled for back surgery this month at [facility]

## 2013-11-02 ENCOUNTER — Encounter: Payer: Self-pay | Admitting: Internal Medicine

## 2013-11-02 ENCOUNTER — Ambulatory Visit (INDEPENDENT_AMBULATORY_CARE_PROVIDER_SITE_OTHER): Payer: Commercial Managed Care - HMO | Admitting: Internal Medicine

## 2013-11-02 VITALS — BP 130/62 | HR 64 | Temp 98.2°F | Wt 190.5 lb

## 2013-11-02 DIAGNOSIS — R9389 Abnormal findings on diagnostic imaging of other specified body structures: Secondary | ICD-10-CM

## 2013-11-02 DIAGNOSIS — S8012XA Contusion of left lower leg, initial encounter: Secondary | ICD-10-CM | POA: Insufficient documentation

## 2013-11-02 DIAGNOSIS — S8010XA Contusion of unspecified lower leg, initial encounter: Secondary | ICD-10-CM

## 2013-11-02 DIAGNOSIS — R93429 Abnormal radiologic findings on diagnostic imaging of unspecified kidney: Secondary | ICD-10-CM | POA: Insufficient documentation

## 2013-11-02 DIAGNOSIS — R319 Hematuria, unspecified: Secondary | ICD-10-CM | POA: Insufficient documentation

## 2013-11-02 NOTE — Assessment & Plan Note (Signed)
Simple cyst seen on MRI left kidney. Per pt, this was seen on previous imaging several years ago.

## 2013-11-02 NOTE — Progress Notes (Signed)
Pre visit review using our clinic review tool, if applicable. No additional management support is needed unless otherwise documented below in the visit note. 

## 2013-11-02 NOTE — Patient Instructions (Signed)
We will set up an evaluation with sports medicine.  Follow up here in 1 week.

## 2013-11-02 NOTE — Assessment & Plan Note (Signed)
Contusion of left thigh after being kicked by a horse. Will set up sports medicine evaluation for possible [name]. Pt is concerned about ramifications of this injury given upcoming back surgery. Continue prn Hydrocodone for pain.

## 2013-11-02 NOTE — Assessment & Plan Note (Signed)
Recent hematuria with atypical cells seen on urine cytology. Urology evaluation pending.

## 2013-11-02 NOTE — Progress Notes (Signed)
Subjective:    Patient ID: Greg Hughes, male    DOB: 1940/08/20, 73 y.o.   MRN: [id number]  HPI 73YO male presents for acute visit.  Recently, found to have renal mass on CT chest. Follow up MRI abdomen showed simple renal cyst. He was also found to have hematuria on urinalysis. Abnormal cells seen on cytology. Urology evaluation is pending. Last cystoscopy 2007. Notes long h/o blood in urine.  Kicked by a horse on Saturday, into left leg. Did not seek care for this. Able to bear weight. Has iced area of the weekend. Taking prn hydrocodone with some improvement in pain.  Review of Systems  Constitutional: Negative for fever, chills, activity change, appetite change, fatigue and unexpected weight change.  Eyes: Negative for visual disturbance.  Respiratory: Negative for cough and shortness of breath.   Cardiovascular: Negative for chest pain, palpitations and leg swelling.  Gastrointestinal: Negative for nausea, vomiting, abdominal pain, diarrhea, constipation and abdominal distention.  Genitourinary: Negative for dysuria, urgency and difficulty urinating.  Musculoskeletal: Positive for arthralgias and myalgias. Negative for gait problem.  Skin: Positive for wound (abrasion left thigh). Negative for color change and rash.  Hematological: Negative for adenopathy.  Psychiatric/Behavioral: Negative for sleep disturbance and dysphoric mood. The patient is not nervous/anxious.        Objective:    BP 130/62  Pulse 64  Temp(Src) 98.2 F (36.8 C) (Oral)  Wt 190 lb 8 oz (86.41 kg)  SpO2 97% Physical Exam  Constitutional: He is oriented to person, place, and time. He appears well-developed and well-nourished. No distress.  HENT:  Head: Normocephalic and atraumatic.  Right Ear: External ear normal.  Left Ear: External ear normal.  Nose: Nose normal.  Mouth/Throat: Oropharynx is clear and moist. No oropharyngeal exudate.  Eyes: Conjunctivae and EOM are normal. Pupils are equal,  round, and reactive to light. Right eye exhibits no discharge. Left eye exhibits no discharge. No scleral icterus.  Neck: Normal range of motion. Neck supple. No tracheal deviation present. No thyromegaly present.  Cardiovascular: Normal rate, regular rhythm and normal heart sounds.  Exam reveals no gallop and no friction rub.   No murmur heard. Pulmonary/Chest: Effort normal and breath sounds normal. No accessory muscle usage. Not tachypneic. No respiratory distress. He has no decreased breath sounds. He has no wheezes. He has no rhonchi. He has no rales. He exhibits no tenderness.  Musculoskeletal: Normal range of motion. He exhibits no edema.  Lymphadenopathy:    He has no cervical adenopathy.  Neurological: He is alert and oriented to person, place, and time. No cranial nerve deficit. Coordination normal.  Skin: Skin is warm and dry. No rash noted. He is not diaphoretic. No erythema. No pallor.     Psychiatric: He has a normal mood and affect. His behavior is normal. Judgment and thought content normal.          Assessment & Plan:   Problem List Items Addressed This Visit     Unprioritized   Abnormal MRI, kidney     Simple cyst seen on MRI left kidney. Per pt, this was seen on previous imaging several years ago.     Contusion of left leg - Primary     Contusion of left thigh after being kicked by a horse. Will set up sports medicine evaluation for possible [name]. Pt is concerned about ramifications of this injury given upcoming back surgery. Continue prn Hydrocodone for pain.    Relevant Orders  Ambulatory referral to Sports Medicine   Hematuria     Recent hematuria with atypical cells seen on urine cytology. Urology evaluation pending.        Return in about 1 week (around 11/09/2013) for Recheck.

## 2013-11-03 ENCOUNTER — Ambulatory Visit (INDEPENDENT_AMBULATORY_CARE_PROVIDER_SITE_OTHER): Payer: Commercial Managed Care - HMO | Admitting: Family Medicine

## 2013-11-03 ENCOUNTER — Encounter: Payer: Self-pay | Admitting: Family Medicine

## 2013-11-03 VITALS — BP 120/62 | HR 63 | Ht 69.0 in | Wt 190.0 lb

## 2013-11-03 DIAGNOSIS — S7010XA Contusion of unspecified thigh, initial encounter: Secondary | ICD-10-CM | POA: Insufficient documentation

## 2013-11-03 DIAGNOSIS — S7012XA Contusion of left thigh, initial encounter: Secondary | ICD-10-CM

## 2013-11-03 MED ORDER — MELOXICAM 15 MG PO TABS: 15.0000 mg | ORAL_TABLET | Freq: Every day | ORAL | Status: DC

## 2013-11-03 NOTE — Assessment & Plan Note (Signed)
Patient does have a fairly large contusion of the quadricep muscle but no fluid accumulation noted. I do think the patient is likely going to take 2-3 weeks to heal properly. Patient in 72 hours is going to start doing a range of motion exercises daily. We discussed icing as well as taking anti-inflammatories for short ten-day dose. Patient warned the potential side effects. Patient was also given a compression sleeve that I think will be beneficial. Patient will try these interventions and come back in 2 weeks. Patient continues to have difficulty we'll do further imaging including x-rays.

## 2013-11-03 NOTE — Patient Instructions (Addendum)
Nice to meet you Stay light on your toes next time.  Meloxicam daily for 10 days then as needed Ice 20 minutes 2 times daily. Usually after activity and before bed. In 3 days start some stretching Wear compression during the day and not at night Come back in 2 weeks if not perfect

## 2013-11-03 NOTE — Progress Notes (Signed)
[facility] [place], [place] [address] Phone: [phone] Subjective:    I'm seeing this patient by the request  of:  Rica Mast, MD   CC: Left leg pain after being kicked by a horse  WGN:[id number] KRISTAN VOTTA is a 73 y.o. male coming in with complaint of left leg pain after being kicked by a horse. This did occur Saturday. Patient states that on Sunday he had significant amount of bruising as well as swelling. Each day this seems to be improving but unfortunately the pain is still there. Patient states that he has no pain at rest but any time he uses his quadriceps he can have discomfort. Denies any radiation of pain denies any numbness or tingling in the foot or toes. Patient states that it does feel somewhat weak. Patient was concerned because he is having back surgery which has been postponed for another month. Patient states that the pain is more of a dull aching pain can be a sharp pain when he is trying to put on his body weight on that leg. States that overall though it seems to be improving. Patient rates the pain as 6/10 in severity.     Past medical history, social, surgical and family history all reviewed in electronic medical record.   Review of Systems: No headache, visual changes, nausea, vomiting, diarrhea, constipation, dizziness, abdominal pain, skin rash, fevers, chills, night sweats, weight loss, swollen lymph nodes, body aches, joint swelling, muscle aches, chest pain, shortness of breath, mood changes.   Objective Blood pressure 120/62, pulse 63, height 5\' 9"  (1.753 m), weight 190 lb (86.183 kg), SpO2 97.00%.  General: No apparent distress alert and oriented x3 mood and affect normal, dressed appropriately.  HEENT: Pupils equal, extraocular movements intact  Respiratory: Patient's speak in full sentences and does not appear short of breath  Cardiovascular: No lower extremity edema, non tender, no erythema    Skin: Warm dry intact with no signs of infection or rash on extremities or on axial skeleton.  Abdomen: Soft nontender  Neuro: Cranial nerves II through XII are intact, neurovascularly intact in all extremities with 2+ DTRs and 2+ pulses.  Lymph: No lymphadenopathy of posterior or anterior cervical chain or axillae bilaterally.  Gait normal with good balance and coordination.  MSK:  Non tender with full range of motion and good stability and symmetric strength and tone of shoulders, elbows, wrist, hip, knee and ankles bilaterally.   Knee: Left .Patient's left quadricep is swollen compared to the contralateral side but no significant bruising. Patient's abrasions from the hoof seems to be healing as well. No signs of infection. Patient is minimally tender to palpation in the mid substance of the quadricep. Patient does have good strength. Extensor mechanism is intact. ROM full in flexion and extension and lower leg rotation. Ligaments with solid consistent endpoints including ACL, PCL, LCL, MCL. Negative Mcmurray's, Apley's, and Thessalonian tests. Non painful patellar compression. Patellar glide without crepitus. Patellar and quadriceps tendons unremarkable. Hamstring and quadriceps strength is normal.  Contralateral knee unremarkable  MSK US performed of: Left knee This study was ordered, performed, and interpreted by Charlann Boxer D.O.  Knee: All structures visualized. Anteromedial, anterolateral, posteromedial, and posterolateral menisci unremarkable without tearing, fraying, effusion, or displacement. Patellar Tendon unremarkable on long and transverse views without effusion. No abnormality of prepatellar bursa. LCL and MCL unremarkable on long and transverse views. No abnormality of origin of medial or lateral head of the gastrocnemius.  Contusion noted of the quadricep but no fluid accumulation noted.  IMPRESSION:  Quadricep contusion     Impression and Recommendations:      This case required medical decision making of moderate complexity.

## 2013-11-09 ENCOUNTER — Ambulatory Visit: Payer: Commercial Managed Care - HMO | Admitting: Internal Medicine

## 2013-11-09 ENCOUNTER — Telehealth: Payer: Self-pay | Admitting: *Deleted

## 2013-11-09 NOTE — Telephone Encounter (Signed)
Notified pt. 

## 2013-11-09 NOTE — Telephone Encounter (Signed)
Fine to postpone until after surgery.

## 2013-11-09 NOTE — Telephone Encounter (Signed)
Pt has his back surgery scheduled for Oct 28th, he would like to postpone his Urology appt until after he heals from his surgery.  Pt is asking if this is okay and how urgent is it that he sees urology.

## 2013-11-17 ENCOUNTER — Ambulatory Visit: Payer: Commercial Managed Care - HMO | Admitting: Family Medicine

## 2013-11-30 ENCOUNTER — Telehealth: Payer: Self-pay

## 2013-11-30 NOTE — Telephone Encounter (Signed)
Per Christell Faith, PA, pt is cleared to proceed w/ Lumbar fusion on 12/15/13. Pt may hold aspirin for 5 days before procedure and resume when deemed safe by the surgeon. Faxed to [facility] at [phone].

## 2013-12-30 ENCOUNTER — Encounter: Payer: Self-pay | Admitting: Internal Medicine

## 2014-01-15 ENCOUNTER — Other Ambulatory Visit: Payer: Self-pay | Admitting: Internal Medicine

## 2014-01-18 ENCOUNTER — Other Ambulatory Visit: Payer: Self-pay | Admitting: Internal Medicine

## 2014-01-18 DIAGNOSIS — M545 Low back pain: Secondary | ICD-10-CM

## 2014-01-18 MED ORDER — HYDROCODONE-ACETAMINOPHEN 5-325 MG PO TABS: 1.0000 | ORAL_TABLET | Freq: Three times a day (TID) | ORAL | Status: DC | PRN

## 2014-02-25 ENCOUNTER — Other Ambulatory Visit: Payer: Self-pay | Admitting: Cardiovascular Disease

## 2014-03-02 ENCOUNTER — Other Ambulatory Visit: Payer: Self-pay | Admitting: Cardiovascular Disease

## 2014-03-03 DIAGNOSIS — Q619 Cystic kidney disease, unspecified: Secondary | ICD-10-CM | POA: Insufficient documentation

## 2014-03-03 DIAGNOSIS — R351 Nocturia: Secondary | ICD-10-CM | POA: Insufficient documentation

## 2014-03-30 ENCOUNTER — Other Ambulatory Visit: Payer: Self-pay | Admitting: Internal Medicine

## 2014-03-30 DIAGNOSIS — M545 Low back pain: Secondary | ICD-10-CM

## 2014-03-30 MED ORDER — HYDROCODONE-ACETAMINOPHEN 5-325 MG PO TABS: 1.0000 | ORAL_TABLET | Freq: Three times a day (TID) | ORAL | Status: DC | PRN

## 2014-04-21 DIAGNOSIS — K4090 Unilateral inguinal hernia, without obstruction or gangrene, not specified as recurrent: Secondary | ICD-10-CM | POA: Insufficient documentation

## 2014-04-21 DIAGNOSIS — D3500 Benign neoplasm of unspecified adrenal gland: Secondary | ICD-10-CM | POA: Insufficient documentation

## 2014-05-10 DIAGNOSIS — M48061 Spinal stenosis, lumbar region without neurogenic claudication: Secondary | ICD-10-CM | POA: Insufficient documentation

## 2014-05-16 ENCOUNTER — Encounter: Payer: Self-pay | Admitting: Cardiovascular Disease

## 2014-05-16 ENCOUNTER — Ambulatory Visit (INDEPENDENT_AMBULATORY_CARE_PROVIDER_SITE_OTHER): Payer: PPO | Admitting: Cardiovascular Disease

## 2014-05-16 VITALS — BP 140/70 | HR 73 | Ht 69.0 in | Wt 193.5 lb

## 2014-05-16 DIAGNOSIS — I4891 Unspecified atrial fibrillation: Secondary | ICD-10-CM | POA: Diagnosis not present

## 2014-05-16 DIAGNOSIS — I700 Atherosclerosis of aorta: Secondary | ICD-10-CM

## 2014-05-16 DIAGNOSIS — E785 Hyperlipidemia, unspecified: Secondary | ICD-10-CM

## 2014-05-16 DIAGNOSIS — I739 Peripheral vascular disease, unspecified: Secondary | ICD-10-CM

## 2014-05-16 DIAGNOSIS — M545 Low back pain: Secondary | ICD-10-CM | POA: Diagnosis not present

## 2014-05-16 DIAGNOSIS — Z720 Tobacco use: Secondary | ICD-10-CM

## 2014-05-16 DIAGNOSIS — I10 Essential (primary) hypertension: Secondary | ICD-10-CM

## 2014-05-16 DIAGNOSIS — M79606 Pain in leg, unspecified: Secondary | ICD-10-CM

## 2014-05-16 DIAGNOSIS — F17200 Nicotine dependence, unspecified, uncomplicated: Secondary | ICD-10-CM

## 2014-05-16 NOTE — Assessment & Plan Note (Signed)
Rare episodes of atrial fibrillation, possibly exacerbated by recent severe back pain. Recommended he increase the metoprolol up to 25 mg twice a day if he continues to have episodes, to call the office. We did discuss anticoagulation. He is not particularly interested at this time. We did suggest if he has more frequent episodes, we could consider antiarrhythmics.

## 2014-05-16 NOTE — Assessment & Plan Note (Signed)
Significant aortic atherosclerosis seen on x-ray. In light of his low back, buttock, leg pain, we have ordered aorta ultrasound with lower extremity Doppler

## 2014-05-16 NOTE — Assessment & Plan Note (Signed)
Back surgery last year, now with chronic residual back pain. He is scheduled to see the chronic pain clinic

## 2014-05-16 NOTE — Assessment & Plan Note (Signed)
We have encouraged him to continue to work on weaning his cigarettes and smoking cessation. He will continue to work on this and does not want any assistance with chantix.  

## 2014-05-16 NOTE — Progress Notes (Signed)
Patient ID: Greg Hughes, male    DOB: 28-Jan-1941, 74 y.o.   MRN: [id number]  HPI Comments: Greg Hughes is a very pleasant 74 year old gentleman with a long history of smoking who continues to smoke one pack per day, hypertension who presented to the hospital in 2013, [place]  with palpitations and shortness of breath.  found to be in atrial fibrillation with RVR, rate 150 beats per minute. He was given IV Cardizem in the emergency room and he converted to normal sinus rhythm during his hospital course. He was discharged on Cardizem 120 mg daily. He presents for routine followup of his atrial fibrillation   In follow-up today, he reports having rare episodes of atrial fibrillation but has been having worsening back pain and because of this he feels he has had several recent episodes of A. Fib. He reports having had one attack last night, one episode last week. Back pain has been severe. He had back surgery last year but continues to have symptoms. He was told by his surgeon to be evaluated for lower extremity arterial disease and his chest x-ray showed prominent aortic calcifications. He reports having pain in his legs when he walks but states this starts in his lower back, extends down his legs.  He reports that he is tired, exhausted when he exerts himself. Exertion related fatigue seems to come and go, some days better than others. He continues to take metoprolol one half pill twice a day, diltiazem daily  EKG on today's visit shows normal sinus rhythm with rate 73 bpm, no significant ST or T-wave changes  Previously reported having rare episodes of atrial fibrillation lasting 20 minutes, possibly 4 episodes over the past year  Echocardiogram 11/08/2011 shows normal systolic function, normal left atrial size, otherwise normal study  Total cholesterol 116, LDL 45, HDL 40   No Known Allergies  Outpatient Encounter Prescriptions as of 05/16/2014  Medication Sig  . allopurinol (ZYLOPRIM)  300 MG tablet TAKE 1 TABLET BY MOUTH EVERY DAY  . aspirin 81 MG tablet Take 81 mg by mouth daily.    . colchicine 0.6 MG tablet Take 1 tablet (0.6 mg total) by mouth 2 (two) times daily as needed.  . diltiazem (CARDIZEM CD) 180 MG 24 hr capsule TAKE 1 CAPSULE (180 MG TOTAL) BY MOUTH DAILY.  . finasteride (PROSCAR) 5 MG tablet TAKE 1 TABLET BY MOUTH EVERY DAY  . HYDROcodone-acetaminophen (NORCO/VICODIN) 5-325 MG per tablet Take 1-2 tablets by mouth 3 (three) times daily as needed for moderate pain or severe pain.  . meloxicam (MOBIC) 15 MG tablet Take 1 tablet (15 mg total) by mouth daily.  . metoprolol tartrate (LOPRESSOR) 25 MG tablet TAKE 1 TABLET (25 MG TOTAL) BY MOUTH 2 (TWO) TIMES DAILY.  . simvastatin (ZOCOR) 10 MG tablet TAKE 1 TABLET (10 MG TOTAL) BY MOUTH AT BEDTIME.  Marland Kitchen terazosin (HYTRIN) 10 MG capsule TAKE ONE CAPSULE BY MOUTH AT BEDTIME  . traMADol (ULTRAM) 50 MG tablet Take 1-2 tablets (50-100 mg total) by mouth 2 (two) times daily.  . [DISCONTINUED] simvastatin (ZOCOR) 10 MG tablet TAKE 1 TABLET (10 MG TOTAL) BY MOUTH AT BEDTIME. (Patient not taking: Reported on 05/16/2014)  . [DISCONTINUED] simvastatin (ZOCOR) 10 MG tablet TAKE 1 TABLET (10 MG TOTAL) BY MOUTH AT BEDTIME. (Patient not taking: Reported on 05/16/2014)    Past Medical History  Diagnosis Date  . Hypertension   . GERD (gastroesophageal reflux disease)   . Hyperlipidemia   . History of colon  polyps   . BPH (benign prostatic hypertrophy)   . Microscopic hematuria   . ED (erectile dysfunction)   . Tobacco abuse   . A-fib 2013  . Lumbar herniated disc   . Spinal stenosis   . CAD (coronary artery disease)     Past Surgical History  Procedure Laterality Date  . Inguinal hernia repair    . Tonsillectomy    . Colonoscopy  2002  . Back surgery      Social History  reports that he has been smoking Cigarettes.  He has a 60 pack-year smoking history. He has never used smokeless tobacco. He reports that he does not  drink alcohol or use illicit drugs.  Family History family history includes Arthritis in his mother; Coronary artery disease in his father; Diabetes in his father; Hiatal hernia in his mother; Stroke in his father. There is no history of Colon cancer.   Review of Systems  Constitutional: Negative.   Respiratory: Negative.   Cardiovascular: Negative.   Gastrointestinal: Negative.   Musculoskeletal: Positive for back pain.  Skin: Negative.   Neurological: Negative.   Hematological: Negative.   Psychiatric/Behavioral: Negative.   All other systems reviewed and are negative.   BP 140/70 mmHg  Pulse 73  Ht 5\' 9"  (1.753 m)  Wt 193 lb 8 oz (87.771 kg)  BMI 28.56 kg/m2  Physical Exam  Constitutional: He is oriented to person, place, and time. He appears well-developed and well-nourished.  HENT:  Head: Normocephalic.  Nose: Nose normal.  Mouth/Throat: Oropharynx is clear and moist.  Eyes: Conjunctivae are normal. Pupils are equal, round, and reactive to light.  Neck: Normal range of motion. Neck supple. No JVD present.  Cardiovascular: Normal rate, regular rhythm, S1 normal, S2 normal, normal heart sounds and intact distal pulses.  Exam reveals no gallop and no friction rub.   No murmur heard. Pulmonary/Chest: Effort normal and breath sounds normal. No respiratory distress. He has no decreased breath sounds. He has no wheezes. He has no rales. He exhibits no tenderness.  Abdominal: Soft. Bowel sounds are normal. He exhibits no distension. There is no tenderness.  Musculoskeletal: Normal range of motion. He exhibits no edema or tenderness.  Lymphadenopathy:    He has no cervical adenopathy.  Neurological: He is alert and oriented to person, place, and time. Coordination normal.  Skin: Skin is warm and dry. No rash noted. No erythema.  Psychiatric: He has a normal mood and affect. His behavior is normal. Judgment and thought content normal.      Assessment and Plan   Nursing  note and vitals reviewed.

## 2014-05-16 NOTE — Assessment & Plan Note (Signed)
Blood pressure is well controlled on today's visit. No changes made to the medications. 

## 2014-05-16 NOTE — Assessment & Plan Note (Signed)
Cholesterol is at goal on the current lipid regimen. No changes to the medications were made.  

## 2014-05-16 NOTE — Patient Instructions (Signed)
You are doing well. No medication changes were made.  We will order a lower extremity ultrasound for claudication symptoms, aortic atherosclerosis, back pain and leg pain  Please call us if you have new issues that need to be addressed before your next appt.  Your physician wants you to follow-up in: 12 months.  You will receive a reminder letter in the mail two months in advance. If you don't receive a letter, please call our office to schedule the follow-up appointment.

## 2014-05-27 ENCOUNTER — Encounter (INDEPENDENT_AMBULATORY_CARE_PROVIDER_SITE_OTHER): Payer: PPO

## 2014-05-27 ENCOUNTER — Other Ambulatory Visit: Payer: Self-pay | Admitting: Internal Medicine

## 2014-05-27 DIAGNOSIS — M545 Low back pain: Secondary | ICD-10-CM

## 2014-05-27 DIAGNOSIS — I739 Peripheral vascular disease, unspecified: Secondary | ICD-10-CM

## 2014-05-27 DIAGNOSIS — I4891 Unspecified atrial fibrillation: Secondary | ICD-10-CM

## 2014-05-27 DIAGNOSIS — I700 Atherosclerosis of aorta: Secondary | ICD-10-CM | POA: Diagnosis not present

## 2014-05-27 DIAGNOSIS — M79606 Pain in leg, unspecified: Secondary | ICD-10-CM

## 2014-05-27 MED ORDER — HYDROCODONE-ACETAMINOPHEN 5-325 MG PO TABS: 1.0000 | ORAL_TABLET | Freq: Three times a day (TID) | ORAL | Status: DC | PRN

## 2014-05-27 NOTE — Telephone Encounter (Signed)
Last visit 11/02/13, no upcoming appts scheduled

## 2014-05-27 NOTE — Telephone Encounter (Signed)
We can refill, however needs a follow up visit. 28min

## 2014-06-07 ENCOUNTER — Telehealth: Payer: Self-pay | Admitting: *Deleted

## 2014-06-07 ENCOUNTER — Other Ambulatory Visit: Payer: Self-pay | Admitting: *Deleted

## 2014-06-07 MED ORDER — DILTIAZEM HCL ER COATED BEADS 180 MG PO CP24: ORAL_CAPSULE | ORAL | Status: DC

## 2014-06-07 NOTE — Telephone Encounter (Signed)
°  1. Which medications need to be refilled? Diltiazem   2. Which pharmacy is medication to be sent to? [facility] on [address]  3. Do they need a 30 day or 90 day supply? 90 day  4. Would they like a call back once the medication has been sent to the pharmacy? No

## 2014-06-07 NOTE — H&P (Signed)
PATIENT NAME:  Greg Hughes, Greg Hughes MR#:  [id number] DATE OF BIRTH:  06/18/1940  DATE OF ADMISSION:  11/08/2011  REFERRING PHYSICIAN: Dr. Benjaman Lobe  PRIMARY CARE PHYSICIAN: [facility]   CHIEF COMPLAINT: Palpitations, indigestion.   HISTORY OF PRESENT ILLNESS: The patient is a pleasant 74 year old Caucasian male with history of hypertension and tobacco abuse who presents with the above chief complaint. The patient states that right after breakfast he started to have indigestion, about 10:00 this morning. Then he felt palpitations and heart racing with some chest pressure associated and some dizziness. There was no chest pain. The symptoms persisted.   He came here and was noted to be in atrial fibrillation with a rapid ventricular response and was given diltiazem IV 25 mg times one. He was also noted to have significantly elevated blood pressure, initial blood pressure of 207/73. On the EKG there is atrial fibrillation with a rate of 157. Hospitalist services were contacted for further evaluation and management. The patient stated that he has been having these off and on palpitations which last about an hour or so and usually are in the mornings.  They subside by themselves.  They started several weeks ago. The last episode was perhaps 2 to 3 weeks ago and again lasted about an hour. Currently he denies having any chest pains. He has a negative troponin as well.   PAST MEDICAL HISTORY:  1. Hypertension.  2. Chronic microscopic hematuria.  3. Tobacco abuse.   PAST SURGICAL HISTORY: Hernia repair 18 years ago.   SOCIAL HISTORY: Smokes tobacco, a pack a day. No alcohol or drug use.   FAMILY HISTORY: Father with cerebrovascular accident. Mom with heart disease and aneurysm.   ALLERGIES: No known drug allergies.   MEDICATIONS:  1. Aspirin 81 mg daily.  2. Ibuprofen 200 mg, 2 tabs once a day as needed for back pain.  3. Losartan 100 mg daily. 4. Ventolin p.r.n.  5. Tramadol 50 mg 1 tab 2 times a day as  needed for back pain.  6. He also takes medication to help him pee but he does not know the name, possibly Flomax.     REVIEW OF SYSTEMS: CONSTITUTIONAL: No fever or fatigue. No weight changes. EYES: No blurry vision or double vision. ENT: No tinnitus or hearing loss. No postnasal drip. RESPIRATORY: No cough or wheezing. No shortness of breath, has some dyspnea on exertion at times. No chronic obstructive pulmonary disease. CARDIOVASCULAR: No chest pain. Felt chest pressure before. No orthopnea, swelling in the legs. No syncope. Has a history of high blood pressure. GI: Denies nausea, vomiting, diarrhea, abdominal pain, or rectal bleeding. GU: Has microscopic hematuria chronically, has had multiple investigations and was told don't worry about it.  ENDOCRINE: Denies polyuria, nocturia, or thyroid problems. HEME/LYMPH: Denies anemia or easy bruising. SKIN: No new rashes. MUSCULOSKELETAL: Has chronic arthritis. NEURO: No numbness, weakness, dementia, or cerebrovascular accident. PSYCH: Denies insomnia, occasional anxiety.   PHYSICAL EXAMINATION:  VITAL SIGNS: Temperature on arrival 96.7, pulse 142, respiratory rate 18, blood pressure on arrival 207/73, last 107/56, oxygen saturation 95% on arrival on room air.   GENERAL: The patient is a well developed, pleasant Caucasian male lying in bed in no obvious distress.   HEENT: Normocephalic, atraumatic. Pupils are equal and reactive. Anicteric sclerae. Extraocular movements intact.   NECK: Supple. No thyroid tenderness. No JVD.   CARDIOVASCULAR: S1, S2. Regular rate and rhythm. No murmurs, rubs, or gallops appreciated.   LUNGS: Clear to auscultation without wheezing or crackles.  ABDOMEN: Soft, nontender, nondistended. Positive bowel sounds in all quadrants.   EXTREMITIES: No significant lower extremity edema.   SKIN: No obvious rashes.   NEUROLOGIC: Cranial nerves II through XII grossly intact. Strength is five out of five in all extremities.    PSYCH: Pleasant awake, alert, oriented times three. Cooperative.   LABORATORY, DIAGNOSTIC, AND RADIOLOGICAL DATA: Glucose 169, creatinine 1.18, sodium 142, potassium 3.3, CO2 26. LFTs within normal limits. Troponin negative. CK-MB within normal limits. TSH 0.824. WBC 13.4, hemoglobin 15.6, hematocrit 45.2, platelets 203. EKG: Rate is 157, atrial fibrillation with RVR, with some ST depressions V2, V3, V4, and V5. No acute ST elevations. X-ray of the chest pending.    ASSESSMENT AND PLAN: We have a pleasant 74 year old Caucasian male with history of hypertension, tobacco abuse, and microscopic hematuria who presents with  palpitations and some chest pressure feelings associated with the palpitations, found with atrial fibrillation with rapid ventricular response, currently in sinus.  1. Atrial fibrillation with rapid ventricular response:  It is unclear if the patient has new onset atrial fibrillation, but per his history it is more likely that he has paroxysmal atrial fibrillation as he has had similar symptoms of palpitations, heart racing lasting about an hour for some time now. The last one was about 2 to 3 weeks ago. Currently he is in sinus after administration of 25 mg IV of diltiazem. He denies having any chest pains and has a negative troponin. We will  admit the patient to telemetry and start him on Cardizem p.o. We will cycle the troponins. We will also obtain an echocardiogram and obtain a cardiology consult with [facility] cardiology. I will start a heparin drip for now. However, the patient does not have a history of congestive heart failure, strokes, or diabetes although he has hyperglycemia here.  It might be that his aspirin could be changed to 325 mg for anticoagulation.  2. Hypertension: The patient had significantly elevated blood pressure on arrival which has responded to IV diltiazem.  We will continue the diltiazem p.o. and stop the losartan given possible new onset atrial fibrillation.  We will also add p.r.n. labetalol IV if his blood pressures become uncontrolled.  3. Elevated glucose: The patient does not have any history of diabetes, but does have elevated sugar here. We will check a hemoglobin A1c and put him on sliding scale insulin.  4. Microscopic hematuria: This is chronic per patient and he has had extensive urological work-up in the past. I will check a urinalysis.  5. Tobacco abuse: He was counseled for three minutes about cessation. He states that he has tried the patch and Wellbutrin and other therapies without success. He will likely not quit.   CODE STATUS: FULL CODE.  TOTAL TIME SPENT:  50 minutes.   ____________________________ Vivien Presto, MD sa:bjt D: 11/08/2011 13:59:58 ET T: 11/08/2011 14:17:10 ET JOB#: [id number]  cc: Vivien Presto, MD, <Dictator> [place] [facility] [place] MD ELECTRONICALLY SIGNED 11/17/2011 17:45

## 2014-06-07 NOTE — Discharge Summary (Signed)
PATIENT NAME:  Greg Hughes, Greg Hughes MR#:  [id number] DATE OF BIRTH:  Dec 19, 1940  DATE OF ADMISSION:  11/08/2011 DATE OF DISCHARGE:  11/09/2011  PRESENTING COMPLAINT: Palpitations and indigestion.   DISCHARGE DIAGNOSES:  1. New onset atrial fibrillation with RVR, resolved, now in sinus rhythm.  2. Hypertension.   CONDITION ON DISCHARGE: Fair.   MEDICATIONS:  1. Aspirin 81 mg daily  2. Tramadol 50 mg twice a day  3. Tylenol 500 mg t.i.d. p.r.n.  4. Cardizem CD 120 mg p.o. daily 5. The patient was advised not to take losartan.   FOLLOW UP: The patient is to make appointment for Monday to follow up with Dr. Rockey Situ, [facility].   LABS AT DISCHARGE: CBC within normal limits. Basic metabolic panel within normal limits. Magnesium 2.1. Hemoglobin A1C 5.9. Lipid profile within normal limits. Cardiac enzymes x3 negative.  Chest X-Ray: No acute cardiopulmonary abnormality. Potassium was on admission was 3.5. TSH 0.824. Echo preliminary results per Dr. Rockey Situ showed normal ejection fraction, mild MR, normal RVSP, essentially normal study.   BRIEF SUMMARY OF HOSPITAL COURSE: Greg Hughes is a 74 year old Caucasian gentleman with history of hypertension who presented with palpitations and indigestion and was admitted with:  1. Rapid atrial fibrillation with RVR, new onset. The patient had been getting steroid shots in the back for last several weeks.  He noticed some palpitations, thereafter could be related to steroid shots as well. The patient received a couple doses of IV diltiazem in the Emergency Room and was started on p.o. Cardizem. The patient's heart rate remained in sinus rhythm. Dr. Rockey Situ was consulted and recommended Cardizem CD 120 mg daily and aspirin. Preliminary echo remained normal. Ejection fraction is normal. The patient will follow up with Dr. Rockey Situ as outpatient.  2. Hypertension.  The patient was switched to calcium channel blocker. His losartan was discontinued.   Hospital stay  otherwise remained stable. The patient remained a FULL CODE STATUS.   TIME SPENT: 40 minutes   ____________________________ Greg Height A. Posey Pronto, MD sap:tbf D: 11/10/2011 06:46:13 ET T: 11/11/2011 15:37:21 ET JOB#: [id number]  cc: Greg Filion A. Posey Pronto, MD, <Dictator> Greg Merritts, MD Greg Basset MD ELECTRONICALLY SIGNED 11/12/2011 16:10

## 2014-06-07 NOTE — Consult Note (Signed)
General Aspect 74 year old Caucasian male with history of hypertension and tobacco abuse (1 ppd), who presents with palpitations, sob. Cardiology was consulted for atrial fib with RVR.   after breakfast he started to have indigestion,10:00 AM. Then he felt palpitations and tachycardia with some chest pressure associated and some dizziness. There was no chest pain.  symptoms persisted.     In the ER, He  was noted to be in atrial fibrillation with a rapid ventricular response.  elevated blood pressure on arrival, initial blood pressure of 207/73. He was given diltiazem IV 25 mg times one.    he has been having these off and on palpitations which last about an hour or so and usually are in the mornings.  They subside by themselves.  They started several weeks ago, rare. The last episode was perhaps 2 to 3 weeks ago and again lasted about an hour. Rate up to 130 bpm. Currently he denies having any chest pains.  No prior cardiac hx. Recent cortisone shot to his back several days ago, also two to three weeks ago prior to last epsiode of tachycardia.    PAST MEDICAL HISTORY:  1. Hypertension.  2. Chronic microscopic hematuria.  3. Tobacco abuse.    PAST SURGICAL HISTORY: Hernia repair 18 years ago.   SOCIAL HISTORY: Smokes tobacco, a pack a day. No alcohol or drug use.   FAMILY HISTORY: Father with cerebrovascular accident. Mom with heart disease and aneurysm.   ALLERGIES: No known drug allergies.   Physical Exam:   GEN well developed, well nourished, no acute distress    HEENT red conjunctivae    NECK supple  No masses    RESP normal resp effort  clear BS    CARD Regular rate and rhythm  No murmur    ABD denies tenderness  soft    LYMPH negative neck    EXTR negative edema    SKIN normal to palpation    NEURO motor/sensory function intact    PSYCH alert, A+O to time, place, person, good insight   Review of Systems:   Subjective/Chief Complaint palpitations, sob, now  resolved    General: No Complaints    Skin: No Complaints    ENT: No Complaints    Eyes: No Complaints    Neck: No Complaints    Respiratory: No Complaints    Cardiovascular: Palpitations  sx now resolved    Gastrointestinal: No Complaints    Genitourinary: No Complaints    Vascular: No Complaints    Musculoskeletal: No Complaints    Neurologic: No Complaints    Hematologic: No Complaints    Endocrine: No Complaints    Psychiatric: No Complaints    Review of Systems: All other systems were reviewed and found to be negative    Medications/Allergies Reviewed Medications/Allergies reviewed     Hypercholesterolemia:    Hypertension:        Admit Diagnosis:   ATRIAL FIBRILLATION: 08-Nov-2011, Active, ATRIAL FIBRILLATION  Home Medications: Medication Instructions Status  aspirin 81 mg oral tablet 1 tab(s) orally once a day (in the evening) Active  ibuprofen 200 mg oral tablet 2 tab(s) orally once a day, As Needed for back pain Active  tramadol 50 mg oral tablet 1 tab(s) orally 2 times a day, As Needed for back pain Active  losartan 100 mg oral tablet 1 tab(s) orally once a day Active   Lab Results:  Thyroid:  20-Sep-13 11:38    Thyroid Stimulating Hormone 0.824 (0.45-4.50 (International Unit)  -----------------------  Pregnant patients have  different reference  ranges for TSH:  - - - - - - - - - -  Pregnant, first trimetser:  0.36 - 2.50 uIU/mL)  Hepatic:  20-Sep-13 11:38    Bilirubin, Total 0.5   Bilirubin, Direct 0.1 (Result(s) reported on 08 Nov 2011 at 12:54PM.)   Alkaline Phosphatase 88   SGPT (ALT) 41   SGOT (AST) 24   Total Protein, Serum 7.3   Albumin, Serum 3.7  Routine Chem:  20-Sep-13 11:38    Glucose, Serum  169   BUN 18   Creatinine (comp) 1.18   Sodium, Serum 142   Potassium, Serum  3.3   Chloride, Serum 106   CO2, Serum 26   Calcium (Total), Serum 9.5   Anion Gap 10   Osmolality (calc) 289   eGFR (African American) >60    eGFR (Non-African American) >60 (eGFR values <22m/min/1.73 m2 may be an indication of chronic kidney disease (CKD). Calculated eGFR is useful in patients with stable renal function. The eGFR calculation will not be reliable in acutely ill patients when serum creatinine is changing rapidly. It is not useful in  patients on dialysis. The eGFR calculation may not be applicable to patients at the low and high extremes of body sizes, pregnant women, and vegetarians.)  Cardiac:  20-Sep-13 11:38    Troponin I < 0.02 (0.00-0.05 0.05 ng/mL or less: NEGATIVE  Repeat testing in 3-6 hrs  if clinically indicated. >0.05 ng/mL: POTENTIAL  MYOCARDIAL INJURY. Repeat  testing in 3-6 hrs if  clinically indicated. NOTE: An increase or decrease  of 30% or more on serial  testing suggests a  clinically important change)   CK, Total 175   CPK-MB, Serum 2.0 (Result(s) reported on 08 Nov 2011 at 12:38PM.)  Routine UA:  20-Sep-13 14:57    Color (UA) Straw   Clarity (UA) Clear   Glucose (UA) Negative   Bilirubin (UA) Negative   Ketones (UA) Negative   Specific Gravity (UA) 1.004   Blood (UA) Negative   pH (UA) 8.0   Protein (UA) Negative   Nitrite (UA) Negative   Leukocyte Esterase (UA) Negative (Result(s) reported on 08 Nov 2011 at 03:18PM.)   RBC (UA) NONE SEEN   WBC (UA) <1 /HPF   Epithelial Cells (UA) NONE SEEN  Result(s) reported on 08 Nov 2011 at 03:18PM.  Routine Coag:  20-Sep-13 11:38    Activated PTT (APTT) 27.4 (A HCT value >55% may artifactually increase the APTT. In one study, the increase was an average of 19%. Reference: "Effect on Routine and Special Coagulation Testing Values of Citrate Anticoagulant Adjustment in Patients with High HCT Values." American Journal of Clinical Pathology 2006;126:400-405.)  Routine Hem:  20-Sep-13 11:38    WBC (CBC)  13.4   RBC (CBC) 5.02   Hemoglobin (CBC) 15.6   Hematocrit (CBC) 45.2   Platelet Count (CBC) 203 (Result(s) reported on 08 Nov 2011 at 12:15PM.)   MCV 90   MCH 31.1   MCHC 34.6   RDW  15.0   EKG:   Interpretation EKG shows atrial fibrillation, rate 157, nonspecific ST ABN (likely rate related)    No Known Allergies:   Vital Signs/Nurse's Notes: **Vital Signs.:   20-Sep-13 16:06   Vital Signs Type Admission   Temperature Temperature (F) 98.1   Celsius 36.7   Temperature Source Oral   Pulse Pulse 63   Respirations Respirations 19   Systolic BP Systolic BP 1696  Diastolic BP (mmHg)  Diastolic BP (mmHg) 69   Mean BP 84   Pulse Ox % Pulse Ox % 96   Pulse Ox Activity Level  At rest   Oxygen Delivery 2L     Impression 74 year old Caucasian male with history of hypertension and tobacco abuse (1 ppd), who presents with palpitations, sob. Cardiology was consulted for atrial fib with RVR.  1) Atrial fibrillation: Rate 157 on arrival Converted after diltiazem IV, NSR on th floor after transfer from ER Prelim echo showing normal EF, normal LA size, mild MR, high normal RVSP (essentially a normal study) Etiology uncertain, low potassium, unable to exclude recent cortisone shot to back as contributor (also happened 2 - 3 weeks ago with shot to back and tachycardia) --Will hold heparin as he received lovenox and now in NSR --Monitor cardiac enz x 2 more given nonspecifc ST abn with rate 157 on arrival --Will decrease diltiazem to 30 mg po q6 with hold parameters (heart rate still in 50s after er diltiazem IV) If able to tolerate diltiazem q6, could hold losartan and change to cardizem 120 mg daily --No further workup if cardiac enz are essentially normal --Follow up in clinic [place] cardiology --aspirin, hold other anticoagulation  2) HTN: Can hold losartan for now, continue diltiazem, transition to long acting if tolerated  3) Smoking Recommended smoking cessation 1 ppd   Electronic Signatures: Ida Rogue (MD)  (Signed 20-Sep-13 18:38)  Authored: General Aspect/Present Illness, History and  Physical Exam, Review of System, Past Medical History, Health Issues, Home Medications, Labs, EKG , Allergies, Vital Signs/Nurse's Notes, Impression/Plan   Last Updated: 20-Sep-13 18:38 by Ida Rogue (MD)

## 2014-06-09 ENCOUNTER — Encounter (HOSPITAL_COMMUNITY): Payer: Self-pay | Admitting: Emergency Medicine

## 2014-06-09 ENCOUNTER — Emergency Department (HOSPITAL_COMMUNITY)
Admission: EM | Admit: 2014-06-09 | Discharge: 2014-06-09 | Disposition: A | Discharge status: Home / Self Care | Payer: PPO | Attending: Emergency Medicine | Admitting: Emergency Medicine

## 2014-06-09 ENCOUNTER — Telehealth: Payer: Self-pay | Admitting: Internal Medicine

## 2014-06-09 ENCOUNTER — Emergency Department (HOSPITAL_COMMUNITY): Payer: PPO

## 2014-06-09 DIAGNOSIS — R531 Weakness: Secondary | ICD-10-CM | POA: Insufficient documentation

## 2014-06-09 DIAGNOSIS — Z87438 Personal history of other diseases of male genital organs: Secondary | ICD-10-CM | POA: Diagnosis not present

## 2014-06-09 DIAGNOSIS — I4891 Unspecified atrial fibrillation: Secondary | ICD-10-CM | POA: Diagnosis not present

## 2014-06-09 DIAGNOSIS — Z720 Tobacco use: Secondary | ICD-10-CM | POA: Insufficient documentation

## 2014-06-09 DIAGNOSIS — E785 Hyperlipidemia, unspecified: Secondary | ICD-10-CM | POA: Insufficient documentation

## 2014-06-09 DIAGNOSIS — R202 Paresthesia of skin: Secondary | ICD-10-CM | POA: Diagnosis not present

## 2014-06-09 DIAGNOSIS — Z7982 Long term (current) use of aspirin: Secondary | ICD-10-CM | POA: Insufficient documentation

## 2014-06-09 DIAGNOSIS — Z79899 Other long term (current) drug therapy: Secondary | ICD-10-CM | POA: Insufficient documentation

## 2014-06-09 DIAGNOSIS — I2510 Atherosclerotic heart disease of native coronary artery without angina pectoris: Secondary | ICD-10-CM | POA: Diagnosis not present

## 2014-06-09 DIAGNOSIS — Z8719 Personal history of other diseases of the digestive system: Secondary | ICD-10-CM | POA: Insufficient documentation

## 2014-06-09 DIAGNOSIS — Z86010 Personal history of colonic polyps: Secondary | ICD-10-CM | POA: Insufficient documentation

## 2014-06-09 DIAGNOSIS — M79601 Pain in right arm: Secondary | ICD-10-CM | POA: Insufficient documentation

## 2014-06-09 DIAGNOSIS — I10 Essential (primary) hypertension: Secondary | ICD-10-CM | POA: Insufficient documentation

## 2014-06-09 LAB — CBC WITH DIFFERENTIAL/PLATELET
BASOS ABS: 0 10*3/uL (ref 0.0–0.1)
Basophils Relative: 0 % (ref 0–1)
Eosinophils Absolute: 0.2 10*3/uL (ref 0.0–0.7)
Eosinophils Relative: 1 % (ref 0–5)
HEMATOCRIT: 46.1 % (ref 39.0–52.0)
HEMOGLOBIN: 15.8 g/dL (ref 13.0–17.0)
LYMPHS ABS: 4.2 10*3/uL — AB (ref 0.7–4.0)
Lymphocytes Relative: 37 % (ref 12–46)
MCH: 30.4 pg (ref 26.0–34.0)
MCHC: 34.3 g/dL (ref 30.0–36.0)
MCV: 88.7 fL (ref 78.0–100.0)
MONOS PCT: 6 % (ref 3–12)
Monocytes Absolute: 0.7 10*3/uL (ref 0.1–1.0)
NEUTROS PCT: 56 % (ref 43–77)
Neutro Abs: 6.2 10*3/uL (ref 1.7–7.7)
Platelets: 178 10*3/uL (ref 150–400)
RBC: 5.2 MIL/uL (ref 4.22–5.81)
RDW: 14.6 % (ref 11.5–15.5)
WBC: 11.2 10*3/uL — ABNORMAL HIGH (ref 4.0–10.5)

## 2014-06-09 LAB — BASIC METABOLIC PANEL
Anion gap: 9 (ref 5–15)
BUN: 15 mg/dL (ref 6–23)
CALCIUM: 9.2 mg/dL (ref 8.4–10.5)
CO2: 23 mmol/L (ref 19–32)
Chloride: 109 mmol/L (ref 96–112)
Creatinine, Ser: 0.93 mg/dL (ref 0.50–1.35)
GFR, EST NON AFRICAN AMERICAN: 81 mL/min — AB (ref >90)
GLUCOSE: 101 mg/dL — AB (ref 70–99)
POTASSIUM: 3.9 mmol/L (ref 3.5–5.1)
SODIUM: 141 mmol/L (ref 135–145)

## 2014-06-09 MED ORDER — MORPHINE SULFATE 4 MG/ML IJ SOLN
4.0000 mg | Freq: Once | INTRAMUSCULAR | Status: AC
  Administered 2014-06-09: 4 mg via INTRAVENOUS
  Filled 2014-06-09: qty 1

## 2014-06-09 MED ORDER — METHYLPREDNISOLONE 4 MG PO TBPK: ORAL_TABLET | ORAL | Status: DC

## 2014-06-09 MED ORDER — KETOROLAC TROMETHAMINE 15 MG/ML IJ SOLN
15.0000 mg | Freq: Once | INTRAMUSCULAR | Status: AC
  Administered 2014-06-09: 15 mg via INTRAVENOUS
  Filled 2014-06-09: qty 1

## 2014-06-09 MED ORDER — DEXAMETHASONE SODIUM PHOSPHATE 4 MG/ML IJ SOLN
4.0000 mg | Freq: Once | INTRAMUSCULAR | Status: AC
  Administered 2014-06-09: 4 mg via INTRAVENOUS
  Filled 2014-06-09: qty 1

## 2014-06-09 NOTE — Telephone Encounter (Signed)
Spoke with pt, advised of MDs message.  Pt states he believes it is his neck and will go to a Chiropractor.

## 2014-06-09 NOTE — Telephone Encounter (Signed)
Please advise 

## 2014-06-09 NOTE — ED Provider Notes (Signed)
I saw and evaluated the patient, reviewed the resident's note and I agree with the findings and plan.   EKG Interpretation   Date/Time:  Thursday June 09 2014 16:21:10 EDT Ventricular Rate:  86 PR Interval:  225 QRS Duration: 99 QT Interval:  444 QTC Calculation: 531 R Axis:   57 Text Interpretation:  Sinus rhythm Prolonged PR interval Abnormal inferior  Q waves Prolonged QT interval No old tracing to compare Confirmed by  [facility]  MD, TREY ([id number]) on 06/09/2014 6:07:18 PM      74 yo male with right arm/neck pain with paresthesias down right arm and now weakness to right hand.  Has inability to extend 3-5th digits.  On exam, well appearing, nontoxic, not distressed, normal respiratory effort, normal perfusion, right hand warm and well perfused, wrist extension normal, unabl to extend 3rd, 4th, and 5ths digits.  Plan MR imaging.     MR showed some cervical stenosis, no cord signal abnormality.  Imaging and exam discussed with [facility]U who will see patient in a few hours in the clinic.    Clinical Impression: 1. Weakness   2. Pain of right upper extremity       Serita Grit, MD 06/10/14 [phone]

## 2014-06-09 NOTE — ED Provider Notes (Signed)
CSN: [id number]     Arrival date & time 06/09/14  1445 History   First MD Initiated Contact with Patient 06/09/14 1505     Chief Complaint  Patient presents with  . Arm Pain     (Consider location/radiation/quality/duration/timing/severity/associated sxs/prior Treatment) HPI   This is a 74 year old male, with a medical history of A. fib, herniated lumbar disc, 6 months status post fusion of the lumbar spine at [facility], presenting today with pain to the right upper extremity. The patient has had 2 weeks of intermittent bilateral upper extremity pain, localized to the bilateral deltoids. This was only with activity. However, at 1300 hrs. the day before this presentation, patient experienced acute onset right upper extremity pain. Located extensor, medial aspect of the right upper extremity. Persistent, throbbing, seems to originate from the lower cervical spine. No new medications up and taken. Positive for paresthesias of the right upper extremity, as well as weakness of the extensor muscles of digits 3 through 5. Otherwise, negative for difficulty speaking, difficulty ambulating, weakness, numbness, tingling of the lower extremities, change in bowel or bladder habits.  Past Medical History  Diagnosis Date  . Hypertension   . GERD (gastroesophageal reflux disease)   . Hyperlipidemia   . History of colon polyps   . BPH (benign prostatic hypertrophy)   . Microscopic hematuria   . ED (erectile dysfunction)   . Tobacco abuse   . A-fib 2013  . Lumbar herniated disc   . Spinal stenosis   . CAD (coronary artery disease)    Past Surgical History  Procedure Laterality Date  . Inguinal hernia repair    . Tonsillectomy    . Colonoscopy  2002  . Back surgery     Family History  Problem Relation Age of Onset  . Diabetes Father   . Coronary artery disease Father   . Stroke Father   . Arthritis Mother   . Hiatal hernia Mother   . Colon cancer Neg Hx    History  Substance Use Topics  .  Smoking status: Current Every Day Smoker -- 1.00 packs/day for 60 years    Types: Cigarettes  . Smokeless tobacco: Never Used  . Alcohol Use: No    Review of Systems  Constitutional: Negative for fever and chills.  HENT: Negative for facial swelling.   Eyes: Negative for pain and visual disturbance.  Respiratory: Negative for chest tightness and shortness of breath.   Cardiovascular: Negative for chest pain.  Gastrointestinal: Negative for nausea and vomiting.  Genitourinary: Negative for dysuria.  Musculoskeletal: Positive for arthralgias.  Neurological: Positive for weakness. Negative for headaches.  Psychiatric/Behavioral: Negative for behavioral problems.      Allergies  Review of patient's allergies indicates no known allergies.  Home Medications   Prior to Admission medications   Medication Sig Start Date End Date Taking? Authorizing Provider  aspirin 81 MG tablet Take 81 mg by mouth daily.     Yes Historical Provider, MD  diltiazem (CARDIZEM CD) 180 MG 24 hr capsule TAKE 1 CAPSULE (180 MG TOTAL) BY MOUTH DAILY. 06/07/14  Yes Minna Merritts, MD  finasteride (PROSCAR) 5 MG tablet TAKE 1 TABLET BY MOUTH EVERY DAY 01/17/14  Yes Jackolyn Confer, MD  gabapentin (NEURONTIN) 300 MG capsule Take 300 mg by mouth 3 (three) times daily.   Yes Historical Provider, MD  HYDROcodone-acetaminophen (NORCO/VICODIN) 5-325 MG per tablet Take 1-2 tablets by mouth 3 (three) times daily as needed for moderate pain or severe pain. 05/27/14  Yes Jackolyn Confer, MD  metoprolol tartrate (LOPRESSOR) 25 MG tablet TAKE 1 TABLET (25 MG TOTAL) BY MOUTH 2 (TWO) TIMES DAILY. 03/01/13  Yes Minna Merritts, MD  simvastatin (ZOCOR) 10 MG tablet TAKE 1 TABLET (10 MG TOTAL) BY MOUTH AT BEDTIME. 03/01/13  Yes Minna Merritts, MD  terazosin (HYTRIN) 10 MG capsule TAKE ONE CAPSULE BY MOUTH AT BEDTIME 10/15/13  Yes Jackolyn Confer, MD  traMADol (ULTRAM) 50 MG tablet Take 1-2 tablets (50-100 mg total) by mouth 2  (two) times daily. 10/12/13  Yes Jackolyn Confer, MD  allopurinol (ZYLOPRIM) 300 MG tablet TAKE 1 TABLET BY MOUTH EVERY DAY 10/15/13   Jackolyn Confer, MD  colchicine 0.6 MG tablet Take 1 tablet (0.6 mg total) by mouth 2 (two) times daily as needed. 09/30/12 12/03/14  Jackolyn Confer, MD  meloxicam (MOBIC) 15 MG tablet Take 1 tablet (15 mg total) by mouth daily. 11/03/13   Lyndal Pulley, DO   BP 141/79 mmHg  Pulse 56  Temp(Src) 97.9 F (36.6 C) (Oral)  Resp 20  SpO2 97% Physical Exam  Constitutional: He is oriented to person, place, and time. He appears well-developed and well-nourished. No distress.  HENT:  Head: Normocephalic and atraumatic.  Mouth/Throat: No oropharyngeal exudate.  Eyes: Conjunctivae are normal. Pupils are equal, round, and reactive to light. No scleral icterus.  Neck: Normal range of motion. No tracheal deviation present. No thyromegaly present.  Cardiovascular: Normal rate, regular rhythm and normal heart sounds.  Exam reveals no gallop and no friction rub.   No murmur heard. Pulmonary/Chest: Effort normal and breath sounds normal. No stridor. No respiratory distress. He has no wheezes. He has no rales. He exhibits no tenderness.  Abdominal: Soft. He exhibits no distension and no mass. There is no tenderness. There is no rebound and no guarding.  Musculoskeletal: Normal range of motion. He exhibits no edema.  Neurological: He is alert and oriented to person, place, and time. A sensory deficit (mild, medial aspect) is present. No cranial nerve deficit. Coordination and gait normal. GCS eye subscore is 4. GCS verbal subscore is 5. GCS motor subscore is 6.  Reflex Scores:      Patellar reflexes are 2+ on the right side and 2+ on the left side. Interosseous muscles has no weakness Profound weakness in the C6-C8 distribution  Skin: Skin is warm and dry. He is not diaphoretic.    ED Course  Procedures (including critical care time) Labs Review Labs Reviewed  CBC  WITH DIFFERENTIAL/PLATELET - Abnormal; Notable for the following:    WBC 11.2 (*)    Lymphs Abs 4.2 (*)    All other components within normal limits  BASIC METABOLIC PANEL - Abnormal; Notable for the following:    Glucose, Bld 101 (*)    GFR calc non Af Amer 81 (*)    All other components within normal limits    Imaging Review Dg Chest 2 View  06/09/2014   CLINICAL DATA:  Right arm pain and numbness for 2 days, no shortness of Breath  EXAM: CHEST  2 VIEW  COMPARISON:  11/08/2011  FINDINGS: Cardiomediastinal silhouette is stable. No acute infiltrate or pulmonary edema. Stable linear scarring in lingula. No pneumothorax.  IMPRESSION: No active cardiopulmonary disease.   Electronically Signed   By: Lahoma Crocker M.D.   On: 06/09/2014 16:52   Ct Head Wo Contrast  06/09/2014   CLINICAL DATA:  History of spine problems, now with right arm pain.  EXAM: CT HEAD WITHOUT CONTRAST  CT CERVICAL SPINE WITHOUT CONTRAST  TECHNIQUE: Multidetector CT imaging of the head and cervical spine was performed following the standard protocol without intravenous contrast. Multiplanar CT image reconstructions of the cervical spine were also generated.  COMPARISON:  None.  FINDINGS: CT HEAD FINDINGS  There is mild likely age-appropriate atrophy with diffuse sulcal prominence. Scattered minimal periventricular hypodensities compatible microvascular ischemic disease. Given background parenchymal abnormalities, there is no CT evidence of acute large territory infarct. No intraparenchymal or extra-axial mass or hemorrhage. Normal size and configuration the ventricles and basilar cisterns. No midline shift. Intracranial atherosclerosis. Limited visualization of the paranasal sinuses and mastoid air cells is normal. No air-fluid levels. Regional soft tissues appear normal. No displaced calvarial fracture.  CT CERVICAL SPINE FINDINGS  C1 to the superior endplate of T2 is imaged.  The head is held in a minimal degree of left lateral  flexion. Otherwise, no alignment of the cervical spine. No anterolisthesis or retrolisthesis. The dens is normally positioned between the lateral masses of C1. There is moderate degenerative change of the atlantodental articulation. Several peripherally corticated ossicles are noted about the right-side of the dens (representative sagittal image 21, series 6, coronal image 23, series 5) likely remotely displaced osteophytes. Normal atlantoaxial articulation  The bilateral facets are normally aligned. There is partial ossification of the left C2-C3 transverse facet.  No fracture or static subluxation of the cervical spine. Cervical vertebral body heights are preserved. Prevertebral soft tissues are normal.  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  There is partial ossification of the nuchal ligament posterior to the C5 spinous process. Scattered shotty bilateral cervical lymph nodes are individually not enlarged by size criteria and presumably reactive in etiology. No bulky cervical lymphadenopathy on this noncontrast examination. There is mild heterogeneity of the thyroid gland without discrete nodule on this noncontrast examination. Limited visualization of lung apices demonstrates advanced biapical centrilobular emphysematous change, left greater than right. Exuberant calcifications within the bilateral carotid bulbs.  IMPRESSION: 1. Mild atrophy and microvascular ischemic disease without acute intracranial process. 2. No fracture static subluxation of the cervical spine. 3. 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 4. Exuberant calcifications within the bilateral carotid bulbs. Further evaluation with nonemergent carotid Doppler ultrasound could be performed as clinically indicated.   Electronically  Signed   By: Sandi Mariscal M.D.   On: 06/09/2014 17:24   Ct Cervical Spine Wo Contrast  06/09/2014   CLINICAL DATA:  History of spine problems, now with right arm pain.  EXAM: CT HEAD WITHOUT CONTRAST  CT CERVICAL SPINE WITHOUT CONTRAST  TECHNIQUE: Multidetector CT imaging of the head and cervical spine was performed following the standard protocol without intravenous contrast. Multiplanar CT image reconstructions of the cervical spine were also generated.  COMPARISON:  None.  FINDINGS: CT HEAD FINDINGS  There is mild likely age-appropriate atrophy with diffuse sulcal prominence. Scattered minimal periventricular hypodensities compatible microvascular ischemic disease. Given background parenchymal abnormalities, there is no CT evidence of acute large territory infarct. No intraparenchymal or extra-axial mass or hemorrhage. Normal size and configuration the ventricles and basilar cisterns. No midline shift. Intracranial atherosclerosis. Limited visualization of the paranasal sinuses and mastoid air cells is normal. No air-fluid levels. Regional soft tissues appear normal. No displaced calvarial fracture.  CT CERVICAL SPINE FINDINGS  C1 to the superior endplate of T2 is imaged.  The head is held in a minimal degree of left lateral flexion. Otherwise, no alignment of the cervical spine. No anterolisthesis or retrolisthesis. The dens is normally positioned between the lateral masses of C1. There is moderate degenerative change of the atlantodental articulation. Several peripherally corticated ossicles are noted about the right-side of the dens (representative sagittal image 21, series 6, coronal image 23, series 5) likely remotely displaced osteophytes. Normal atlantoaxial articulation  The bilateral facets are normally aligned. There is partial ossification of the left C2-C3 transverse facet.  No fracture or static subluxation of the cervical spine. Cervical vertebral body heights are preserved. Prevertebral soft  tissues are normal.  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  There is partial ossification of the nuchal ligament posterior to the C5 spinous process. Scattered shotty bilateral cervical lymph nodes are individually not enlarged by size criteria and presumably reactive in etiology. No bulky cervical lymphadenopathy on this noncontrast examination. There is mild heterogeneity of the thyroid gland without discrete nodule on this noncontrast examination. Limited visualization of lung apices demonstrates advanced biapical centrilobular emphysematous change, left greater than right. Exuberant calcifications within the bilateral carotid bulbs.  IMPRESSION: 1. Mild atrophy and microvascular ischemic disease without acute intracranial process. 2. No fracture static subluxation of the cervical spine. 3. 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 4. Exuberant calcifications within the bilateral carotid bulbs. Further evaluation with nonemergent carotid Doppler ultrasound could be performed as clinically indicated.   Electronically Signed   By: Sandi Mariscal M.D.   On: 06/09/2014 17:24   Mr Brain Wo Contrast  06/09/2014   CLINICAL DATA:  74 year old male with generalized weakness. Initial encounter.  EXAM: MRI HEAD WITHOUT CONTRAST  TECHNIQUE: Multiplanar, multiecho pulse sequences of the brain and surrounding structures were obtained without intravenous contrast.  COMPARISON:  Head CT without contrast 1707 hr today.  FINDINGS: Major intracranial vascular flow voids are preserved. No convincing restricted diffusion to suggest acute infarction. No midline shift, mass effect, evidence of mass lesion, ventriculomegaly, extra-axial collection or acute intracranial hemorrhage. Cervicomedullary junction and pituitary are  within normal limits.  Patchy and confluent cerebral white matter T2 and FLAIR hyperintensity. No cortical encephalomalacia. Comparatively mild T2 heterogeneity in the deep gray matter nuclei. Brainstem and cerebellum are within normal limits for age.  Visible internal auditory structures appear normal. Visualized paranasal sinuses and mastoids are clear. Visualized orbit soft tissues are within normal limits. Visualized scalp soft tissues are within normal limits. Normal bone marrow signal.  IMPRESSION: 1.  No acute intracranial abnormality. 2. Moderate nonspecific cerebral white matter signal changes, most commonly due to chronic small vessel disease.   Electronically Signed   By: Genevie Ann M.D.   On: 06/09/2014 22:06   Mr Cervical Spine Wo Contrast  06/09/2014   CLINICAL DATA:  74 year old male with generalized weakness. Right upper extremity pain. Previous spine surgery. Initial encounter.  EXAM: MRI THORACIC AND CERVICAL SPINE WITHOUT CONTRAST  TECHNIQUE: Multiplanar and multiecho pulse sequences of the thoracic and cervical spine were obtained without intravenous contrast.  COMPARISON:  Cervical spine CT 1707 hr today.  Chest CT 10/26/2013.  FINDINGS: MR CERVICAL SPINE FINDINGS  Straightening of cervical lordosis. Widespread chronic disc and endplate degeneration. No marrow edema or evidence of acute osseous  abnormality.  Lingual tonsil hypertrophy appears symmetric. Negative paraspinal soft tissues.  Cervicomedullary junction is within normal limits. No spinal cord signal abnormality identified.  C2-C3: Moderate left facet and mild uncovertebral hypertrophy. Mild to moderate left C3 foraminal stenosis.  C3-C4: Disc space loss with right eccentric disc osteophyte complex. Mild ligament flavum hypertrophy. Mild spinal stenosis without spinal cord mass effect. Right greater than left uncovertebral hypertrophy. Severe right and moderate left C4 foraminal stenosis.  C4-C5: Left eccentric circumferential disc  osteophyte complex. Mild facet hypertrophy. Spinal stenosis without spinal cord mass effect. Uncovertebral hypertrophy. Severe left and moderate right C5 foraminal stenosis.  C5-C6: Right eccentric disc osteophyte complex. Superimposed right lateral recess disc protrusion or osteophyte (series 600, image 26). Mild ligament flavum hypertrophy. Spinal stenosis with mild spinal cord flattening. No cord signal abnormality. Uncovertebral hypertrophy. Severe bilateral C6 foraminal stenosis.  C6-C7: Circumferential disc osteophyte complex. Mild ligament flavum hypertrophy. Broad-based posterior component of disc. Spinal stenosis with mild spinal cord flattening. No cord signal abnormality. Uncovertebral hypertrophy. Severe bilateral C7 foraminal stenosis.  C7-T1: Mild circumferential disc osteophyte complex. Moderate facet hypertrophy greater on the right. No spinal stenosis. Moderate right C8 foraminal stenosis.  MR THORACIC SPINE FINDINGS  Stable and normal thoracic vertebral height and alignment. No marrow edema or evidence of acute osseous abnormality.  There are small layering bilateral pleural effusions. Otherwise negative visualized thoracic and upper abdominal viscera. Negative visualized posterior paraspinal soft tissues.  There is a degree of thoracic epidural lipomatosis diffusely. This is most pronounced at the T7-T8 level where there is subtotal CSF effacement from the thecal sac. The following superimposed degenerative changes are noted:  T1-T2: Mild to moderate facet hypertrophy greater on the right. Mild right T1 foraminal stenosis.  T2-T3: Negative.  T3-T4: Negative.  T4-T5: Negative.  T5-T6: Negative.  T6-T7: Negative.  T7-T8: Mild facet hypertrophy.  No foraminal stenosis.  T8-T9: Negative.  T9-T10: Mild facet hypertrophy greater on the right. No foraminal stenosis.  T10-T11: Minimal disc bulge. Mild facet hypertrophy. No significant stenosis.  T11-T12: Negative.  T12-L1:  Negative.  No thoracic spinal  cord mass effect or signal abnormality.  IMPRESSION: 1. No acute findings in the cervical spine, but widespread degenerative cervical spinal stenosis including mild spinal cord mass effect but no cord signal abnormality. 2. Multifactorial moderate or severe cervical foraminal stenosis, worst at the right C4, left C5, bilateral C6, and bilateral C7 nerve levels. 3. Comparatively mild thoracic spine degeneration, but there is thoracic epidural lipomatosis which results in up to mild spinal stenosis, most pronounced at T8-T9. No thoracic spinal cord mass effect or signal abnormality. 4. Small layering pleural effusions.   Electronically Signed   By: Genevie Ann M.D.   On: 06/09/2014 22:00   Mr Thoracic Spine Wo Contrast  06/09/2014   CLINICAL DATA:  74 year old male with generalized weakness. Right upper extremity pain. Previous spine surgery. Initial encounter.  EXAM: MRI THORACIC AND CERVICAL SPINE WITHOUT CONTRAST  TECHNIQUE: Multiplanar and multiecho pulse sequences of the thoracic and cervical spine were obtained without intravenous contrast.  COMPARISON:  Cervical spine CT 1707 hr today.  Chest CT 10/26/2013.  FINDINGS: MR CERVICAL SPINE FINDINGS  Straightening of cervical lordosis. Widespread chronic disc and endplate degeneration. No marrow edema or evidence of acute osseous abnormality.  Lingual tonsil hypertrophy appears symmetric. Negative paraspinal soft tissues.  Cervicomedullary junction is within normal limits. No spinal cord signal abnormality identified.  C2-C3: Moderate left facet and mild uncovertebral hypertrophy. Mild to moderate left C3  foraminal stenosis.  C3-C4: Disc space loss with right eccentric disc osteophyte complex. Mild ligament flavum hypertrophy. Mild spinal stenosis without spinal cord mass effect. Right greater than left uncovertebral hypertrophy. Severe right and moderate left C4 foraminal stenosis.  C4-C5: Left eccentric circumferential disc osteophyte complex. Mild facet  hypertrophy. Spinal stenosis without spinal cord mass effect. Uncovertebral hypertrophy. Severe left and moderate right C5 foraminal stenosis.  C5-C6: Right eccentric disc osteophyte complex. Superimposed right lateral recess disc protrusion or osteophyte (series 600, image 26). Mild ligament flavum hypertrophy. Spinal stenosis with mild spinal cord flattening. No cord signal abnormality. Uncovertebral hypertrophy. Severe bilateral C6 foraminal stenosis.  C6-C7: Circumferential disc osteophyte complex. Mild ligament flavum hypertrophy. Broad-based posterior component of disc. Spinal stenosis with mild spinal cord flattening. No cord signal abnormality. Uncovertebral hypertrophy. Severe bilateral C7 foraminal stenosis.  C7-T1: Mild circumferential disc osteophyte complex. Moderate facet hypertrophy greater on the right. No spinal stenosis. Moderate right C8 foraminal stenosis.  MR THORACIC SPINE FINDINGS  Stable and normal thoracic vertebral height and alignment. No marrow edema or evidence of acute osseous abnormality.  There are small layering bilateral pleural effusions. Otherwise negative visualized thoracic and upper abdominal viscera. Negative visualized posterior paraspinal soft tissues.  There is a degree of thoracic epidural lipomatosis diffusely. This is most pronounced at the T7-T8 level where there is subtotal CSF effacement from the thecal sac. The following superimposed degenerative changes are noted:  T1-T2: Mild to moderate facet hypertrophy greater on the right. Mild right T1 foraminal stenosis.  T2-T3: Negative.  T3-T4: Negative.  T4-T5: Negative.  T5-T6: Negative.  T6-T7: Negative.  T7-T8: Mild facet hypertrophy.  No foraminal stenosis.  T8-T9: Negative.  T9-T10: Mild facet hypertrophy greater on the right. No foraminal stenosis.  T10-T11: Minimal disc bulge. Mild facet hypertrophy. No significant stenosis.  T11-T12: Negative.  T12-L1:  Negative.  No thoracic spinal cord mass effect or signal  abnormality.  IMPRESSION: 1. No acute findings in the cervical spine, but widespread degenerative cervical spinal stenosis including mild spinal cord mass effect but no cord signal abnormality. 2. Multifactorial moderate or severe cervical foraminal stenosis, worst at the right C4, left C5, bilateral C6, and bilateral C7 nerve levels. 3. Comparatively mild thoracic spine degeneration, but there is thoracic epidural lipomatosis which results in up to mild spinal stenosis, most pronounced at T8-T9. No thoracic spinal cord mass effect or signal abnormality. 4. Small layering pleural effusions.   Electronically Signed   By: Genevie Ann M.D.   On: 06/09/2014 22:00     EKG Interpretation   Date/Time:  Thursday June 09 2014 16:21:10 EDT Ventricular Rate:  86 PR Interval:  225 QRS Duration: 99 QT Interval:  444 QTC Calculation: 531 R Axis:   57 Text Interpretation:  Sinus rhythm Prolonged PR interval Abnormal inferior  Q waves Prolonged QT interval No old tracing to compare Confirmed by  [facility]  MD, TREY ([id number]) on 06/09/2014 6:07:18 PM      MDM   Final diagnoses:  Weakness  Pain of right upper extremity    This is a 74 year old male, with a medical history of A. fib, herniated lumbar disc, 6 months status post fusion of the lumbar spine at [facility], presenting today with pain to the right upper extremity. The patient has had 2 weeks of intermittent bilateral upper extremity pain, localized to the bilateral deltoids. This was only with activity. However, at 1300 hrs. the day before this presentation, patient experienced acute onset right upper extremity pain. Located extensor,  medial aspect of the right upper extremity. Persistent, throbbing, seems to originate from the lower cervical spine. No new medications up and taken. Positive for paresthesias of the right upper extremity, as well as weakness of the extensor muscles of digits 3 through 5. Otherwise, negative for difficulty speaking, difficulty  ambulating, weakness, numbness, tingling of the lower extremities, change in bowel or bladder habits.  On examination, vital signs within normal limits. Negative for abnormalities of cardiovascular or respiratory exams. Negative for bruits of the abdomen. Neurologic exam is within normal limits, with the exception of sensory deficits in the C7, C8 distribution, as well as profound weakness of the CT 6 through C8 distribution. Right upper extremity has good cap refill.  This presentation appears to be radicular in character. However, will rule out CVA with head imaging. I deem this unlikely to be ACS, dissection, or concerning thoracic etiology. EKG has been ordered. Troponin not indicated. Will follow-up imaging, including CT of the head, CT of the C-spine. If imaging as well as labs are within normal limits, will order MRI of the brain, as spine. We'll continue to monitor patient closely.  CT scan of head, neck reveals cervical stenosis. EKG is without ischemic change. Labs reveal no concerning abnormalities. MRI of the brain reveals no obvious stroke. MRI of the cervical spine reveals stenosis with mass effect, without abnormal cord signal. Examination remained stable, with after mentioned deficit. I spoke with neurosurgery. They will see the patient tomorrow morning in clinic.  Decadron given here.  Medrol dose pack prescribed.  Pt stable for discharge, FU.  All questions answered.  Return precautions given.  I have discussed case and care has been guided by my attending physician, Dr. Doy Mince.    Doy Hutching, MD 06/09/14 [id number]  Serita Grit, MD 06/10/14 [phone]

## 2014-06-09 NOTE — Telephone Encounter (Signed)
[name] Patient Name: Greg Hughes DOB: 27-Nov-1940 Initial Comment Caller states he is having pain in both shoulders and he has very little control of hand and fingers on right hand Nurse Assessment Nurse: Markus Daft, RN, Sherre Poot Date/Time (Eastern Time): 06/09/2014 9:15:28 AM Confirm and document reason for call. If symptomatic, describe symptoms. ---Caller states he is having pain in both shoulders and he has a lot of pain in the right arm, and very little control of hand and fingers on right hand - weakness. Unable to grip a glass of water. Started yesterday AM with tingling, muscle spasm, and pains in the right arm. -- A few weeks ago had stiffness/soreness in both arms, and he would raise them up and then it would get worse, and did not last long, a few seconds - discussed with MD. Has the patient traveled out of the country within the last 30 days? ---Not Applicable Does the patient require triage? ---Yes Related visit to physician within the last 2 weeks? ---No Does the PT have any chronic conditions? (i.e. diabetes, asthma, etc.) ---Yes List chronic conditions. ---Spinal fusion 6 months ago to lower back. Guidelines Guideline Title Affirmed Question Affirmed Notes Arm Pain Weakness (i.e., loss of strength) in hand or fingers (Exception: not truly weak; hand feels weak because of pain) Final Disposition User See Physician within 4 Hours (or PCP triage) Markus Daft, RN, Windy Comments Caller states that he has appt with Dr. Gilford Rile at 3 pm. RN reviewed schedule and did not see that he had any appts with her for today, and she and Dr. Derrel Nip are booked (pt prefers one of these doctors). RN will send note to staff about getting him worked in with MD, and caller will await a call back to confirm.

## 2014-06-09 NOTE — Discharge Instructions (Signed)
Weakness Weakness is a lack of strength. It may be felt all over the body (generalized) or in one specific part of the body (focal). Some causes of weakness can be serious. You may need further medical evaluation, especially if you are elderly or you have a history of immunosuppression (such as chemotherapy or HIV), kidney disease, heart disease, or diabetes. CAUSES  Weakness can be caused by many different things, including:  Infection.  Physical exhaustion.  Internal bleeding or other blood loss that results in a lack of red blood cells (anemia).  Dehydration. This cause is more common in elderly people.  Side effects or electrolyte abnormalities from medicines, such as pain medicines or sedatives.  Emotional distress, anxiety, or depression.  Circulation problems, especially severe peripheral arterial disease.  Heart disease, such as rapid atrial fibrillation, bradycardia, or heart failure.  Nervous system disorders, such as Guillain-Barr syndrome, multiple sclerosis, or stroke. DIAGNOSIS  To find the cause of your weakness, your caregiver will take your history and perform a physical exam. Lab tests or X-rays may also be ordered, if needed. TREATMENT  Treatment of weakness depends on the cause of your symptoms and can vary greatly. HOME CARE INSTRUCTIONS   Rest as needed.  Eat a well-balanced diet.  Try to get some exercise every day.  Only take over-the-counter or prescription medicines as directed by your caregiver. SEEK MEDICAL CARE IF:   Your weakness seems to be getting worse or spreads to other parts of your body.  You develop new aches or pains. SEEK IMMEDIATE MEDICAL CARE IF:   You cannot perform your normal daily activities, such as getting dressed and feeding yourself.  You cannot walk up and down stairs, or you feel exhausted when you do so.  You have shortness of breath or chest pain.  You have difficulty moving parts of your body.  You have weakness  in only one area of the body or on only one side of the body.  You have a fever.  You have trouble speaking or swallowing.  You cannot control your bladder or bowel movements.  You have black or bloody vomit or stools. MAKE SURE YOU:  Understand these instructions.  Will watch your condition.  Will get help right away if you are not doing well or get worse. Document Released: 02/04/2005 Document Revised: 08/06/2011 Document Reviewed: 04/05/2011 [facility] Patient Information 2015 [place], [place]. This information is not intended to replace advice given to you by your health care provider. Make sure you discuss any questions you have with your health care provider. Cervical Radiculopathy Cervical radiculopathy happens when a nerve in the neck is pinched or bruised by a slipped (herniated) disk or by arthritic changes in the bones of the cervical spine. This can occur due to an injury or as part of the normal aging process. Pressure on the cervical nerves can cause pain or numbness that runs from your neck all the way down into your arm and fingers. CAUSES  There are many possible causes, including:  Injury.  Muscle tightness in the neck from overuse.  Swollen, painful joints (arthritis).  Breakdown or degeneration in the bones and joints of the spine (spondylosis) due to aging.  Bone spurs that may develop near the cervical nerves. SYMPTOMS  Symptoms include pain, weakness, or numbness in the affected arm and hand. Pain can be severe or irritating. Symptoms may be worse when extending or turning the neck. DIAGNOSIS  Your caregiver will ask about your symptoms and do a  physical exam. He or she may test your strength and reflexes. X-rays, CT scans, and MRI scans may be needed in cases of injury or if the symptoms do not go away after a period of time. Electromyography (EMG) or nerve conduction testing may be done to study how your nerves and muscles are working. TREATMENT  Your  caregiver may recommend certain exercises to help relieve your symptoms. Cervical radiculopathy can, and often does, get better with time and treatment. If your problems continue, treatment options may include:  Wearing a soft collar for short periods of time.  Physical therapy to strengthen the neck muscles.  Medicines, such as nonsteroidal anti-inflammatory drugs (NSAIDs), oral corticosteroids, or spinal injections.  Surgery. Different types of surgery may be done depending on the cause of your problems. HOME CARE INSTRUCTIONS   Put ice on the affected area.  Put ice in a plastic bag.  Place a towel between your skin and the bag.  Leave the ice on for 15-20 minutes, 03-04 times a day or as directed by your caregiver.  If ice does not help, you can try using heat. Take a warm shower or bath, or use a hot water bottle as directed by your caregiver.  You may try a gentle neck and shoulder massage.  Use a flat pillow when you sleep.  Only take over-the-counter or prescription medicines for pain, discomfort, or fever as directed by your caregiver.  If physical therapy was prescribed, follow your caregiver's directions.  If a soft collar was prescribed, use it as directed. SEEK IMMEDIATE MEDICAL CARE IF:   Your pain gets much worse and cannot be controlled with medicines.  You have weakness or numbness in your hand, arm, face, or leg.  You have a high fever or a stiff, rigid neck.  You lose bowel or bladder control (incontinence).  You have trouble with walking, balance, or speaking. MAKE SURE YOU:   Understand these instructions.  Will watch your condition.  Will get help right away if you are not doing well or get worse. Document Released: 10/30/2000 Document Revised: 04/29/2011 Document Reviewed: 09/18/2010 [facility] Patient Information 2015 [place], [place]. This information is not intended to replace advice given to you by your health care provider. Make sure you  discuss any questions you have with your health care provider.

## 2014-06-09 NOTE — Telephone Encounter (Signed)
We can offer an evaluation with Lorane Gell today, however ideally he should see his neurosurgeon today if possible

## 2014-06-09 NOTE — ED Notes (Addendum)
Pt reports right arm pain with hx of spine problems. Pain started yesterday; 3 fusions. States he is having difficulty controlling hand and has lack of grip strength. Has equal strong grips in both hands but states he is having trouble moving fingers. No drift noted. Neurologically intact. No deficits.

## 2014-06-13 ENCOUNTER — Telehealth: Payer: Self-pay | Admitting: *Deleted

## 2014-06-13 ENCOUNTER — Other Ambulatory Visit: Payer: Self-pay | Admitting: Neurological Surgery

## 2014-06-13 NOTE — Telephone Encounter (Signed)
Pt is asking when we receive a fax for him to get cleared for surgery. To please call him.  He was told it would be in our office today.

## 2014-06-14 ENCOUNTER — Encounter (HOSPITAL_COMMUNITY): Payer: Self-pay | Admitting: *Deleted

## 2014-06-14 NOTE — Progress Notes (Signed)
Pt denies SOB and chest pain but is under the care of Dr. Rockey Situ, cardiology. Pt stated that he stopped taking Aspirin and Mobic; pt made aware to stop NSAIDs, otc  vitamins and herbal medications. LVM with Lorriane Shire, CMA to fax copy of cardiac clearance and to have MD sign orders. Pt verbalized understanding of all pre-op instructions.Pt chart forwarded to [place], [place] ( anesthesia ) to review cardiac history.

## 2014-06-15 NOTE — Progress Notes (Signed)
Anesthesia Chart Review: SAME DAY WORK-UP.  Patient is a 74 year old male scheduled for two level ACDF tomorrow by Dr. Ronnald Ramp.  History includes PAF (diagnosed '13; he declined anticoagulation per 04/2014 cardiology notes), CAD (not specified), HTN, GERD, HLD, BPH, ED, s/p L3-5 XLIF 12/15/13 [facility]), smoking. He has pulmonary nodules that are being followed by CT surgeon Dr. Roxan Hockey. In 10/2013 he also referred patient to urology due to incidental finding of left kidney mass like protrusion that was felt to be likely a cyst on MRI. He reported syncope when getting up after anesthesia in the past.   PCP is listed as Dr. Ronette Deter. Cardiologist is Dr. Rockey Situ, last visit 05/16/14. There is a signed note of cardiac clearance--moderate CV risk.    Meds include ASA, diltiazem, finasteride, gabapentin, Nocro, Medrol Dosepak (to be completed 06/15/14), Lopressor, Zocor, terazosin.  Echocardiogram 11/08/2011 [facility]; scanned under Media tab) shows normal LV systolic function, EF > 01%, normal left atrial size, normal RVSP, otherwise normal study.  Reports last stress test > 10 years ago.  06/09/14 EKG: SR, occasional PACs, prolonged QT, Q waves in inferior leads and V5-6 which appear insignificant and have been present of previous EKGs dating back to 10/2011.  Labs on 06/09/14 noted.   Patient underwent lumbar fusion ~ 6 months ago and saw his cardiologist last month. He did require an increase in his metoprolol due to patient feeling like he was having some intermittent breakthrough afib, but no additional testing ordered. He was not tachycardic at his recent ED visit and was in SR at that time.  Further evaluation by his assigned anesthesiologist on the day of surgery.  If no acute changes then I would anticipate that he could proceed as planned.  George Hugh [facility]/Anesthesiology Phone [phone] 06/15/2014 12:16 PM

## 2014-06-16 ENCOUNTER — Inpatient Hospital Stay (HOSPITAL_COMMUNITY): Payer: PPO

## 2014-06-16 ENCOUNTER — Inpatient Hospital Stay (HOSPITAL_COMMUNITY): Payer: PPO | Admitting: Vascular Surgery

## 2014-06-16 ENCOUNTER — Ambulatory Visit: Payer: PPO | Admitting: Internal Medicine

## 2014-06-16 ENCOUNTER — Encounter (HOSPITAL_COMMUNITY): Payer: Self-pay | Admitting: Anesthesiology

## 2014-06-16 ENCOUNTER — Encounter (HOSPITAL_COMMUNITY)
Admission: RE | Disposition: A | Discharge status: Home / Self Care | Payer: Self-pay | Source: Ambulatory Visit | Attending: Neurological Surgery

## 2014-06-16 ENCOUNTER — Inpatient Hospital Stay (HOSPITAL_COMMUNITY)
Admission: RE | Admit: 2014-06-16 | Discharge: 2014-06-17 | DRG: 473 | Disposition: A | Discharge status: Home / Self Care | Payer: PPO | Source: Ambulatory Visit | Attending: Neurological Surgery | Admitting: Neurological Surgery

## 2014-06-16 DIAGNOSIS — Z86010 Personal history of colonic polyps: Secondary | ICD-10-CM | POA: Diagnosis not present

## 2014-06-16 DIAGNOSIS — H269 Unspecified cataract: Secondary | ICD-10-CM | POA: Diagnosis present

## 2014-06-16 DIAGNOSIS — E785 Hyperlipidemia, unspecified: Secondary | ICD-10-CM | POA: Diagnosis present

## 2014-06-16 DIAGNOSIS — N400 Enlarged prostate without lower urinary tract symptoms: Secondary | ICD-10-CM | POA: Diagnosis present

## 2014-06-16 DIAGNOSIS — R918 Other nonspecific abnormal finding of lung field: Secondary | ICD-10-CM | POA: Diagnosis present

## 2014-06-16 DIAGNOSIS — N529 Male erectile dysfunction, unspecified: Secondary | ICD-10-CM | POA: Diagnosis present

## 2014-06-16 DIAGNOSIS — I480 Paroxysmal atrial fibrillation: Secondary | ICD-10-CM | POA: Diagnosis present

## 2014-06-16 DIAGNOSIS — Z981 Arthrodesis status: Secondary | ICD-10-CM | POA: Diagnosis not present

## 2014-06-16 DIAGNOSIS — I10 Essential (primary) hypertension: Secondary | ICD-10-CM | POA: Diagnosis not present

## 2014-06-16 DIAGNOSIS — M4802 Spinal stenosis, cervical region: Principal | ICD-10-CM | POA: Diagnosis present

## 2014-06-16 DIAGNOSIS — M47892 Other spondylosis, cervical region: Secondary | ICD-10-CM | POA: Diagnosis present

## 2014-06-16 DIAGNOSIS — Z79899 Other long term (current) drug therapy: Secondary | ICD-10-CM | POA: Diagnosis not present

## 2014-06-16 DIAGNOSIS — M545 Low back pain: Secondary | ICD-10-CM

## 2014-06-16 DIAGNOSIS — C439 Malignant melanoma of skin, unspecified: Secondary | ICD-10-CM | POA: Diagnosis not present

## 2014-06-16 DIAGNOSIS — I2510 Atherosclerotic heart disease of native coronary artery without angina pectoris: Secondary | ICD-10-CM | POA: Diagnosis present

## 2014-06-16 DIAGNOSIS — Z7982 Long term (current) use of aspirin: Secondary | ICD-10-CM | POA: Diagnosis not present

## 2014-06-16 DIAGNOSIS — K219 Gastro-esophageal reflux disease without esophagitis: Secondary | ICD-10-CM | POA: Diagnosis present

## 2014-06-16 DIAGNOSIS — M5022 Other cervical disc displacement, mid-cervical region: Secondary | ICD-10-CM | POA: Diagnosis present

## 2014-06-16 DIAGNOSIS — Z8249 Family history of ischemic heart disease and other diseases of the circulatory system: Secondary | ICD-10-CM

## 2014-06-16 DIAGNOSIS — F17210 Nicotine dependence, cigarettes, uncomplicated: Secondary | ICD-10-CM | POA: Diagnosis present

## 2014-06-16 DIAGNOSIS — Z419 Encounter for procedure for purposes other than remedying health state, unspecified: Secondary | ICD-10-CM

## 2014-06-16 DIAGNOSIS — Z7952 Long term (current) use of systemic steroids: Secondary | ICD-10-CM | POA: Diagnosis not present

## 2014-06-16 HISTORY — DX: Adverse effect of unspecified anesthetic, initial encounter: T41.45XA

## 2014-06-16 HISTORY — DX: Other complications of anesthesia, initial encounter: T88.59XA

## 2014-06-16 HISTORY — DX: Unspecified abdominal hernia without obstruction or gangrene: K46.9

## 2014-06-16 HISTORY — DX: Unspecified cataract: H26.9

## 2014-06-16 HISTORY — PX: ANTERIOR CERVICAL DECOMP/DISCECTOMY FUSION: SHX1161

## 2014-06-16 HISTORY — DX: Malignant (primary) neoplasm, unspecified: C80.1

## 2014-06-16 LAB — GLUCOSE, CAPILLARY: GLUCOSE-CAPILLARY: 173 mg/dL — AB (ref 70–99)

## 2014-06-16 LAB — PROTIME-INR
INR: 1.04 (ref 0.00–1.49)
Prothrombin Time: 13.7 seconds (ref 11.6–15.2)

## 2014-06-16 LAB — SURGICAL PCR SCREEN
MRSA, PCR: NEGATIVE
Staphylococcus aureus: NEGATIVE

## 2014-06-16 SURGERY — ANTERIOR CERVICAL DECOMPRESSION/DISCECTOMY FUSION 2 LEVELS
Anesthesia: General | Site: Spine Cervical

## 2014-06-16 MED ORDER — MUPIROCIN 2 % EX OINT
TOPICAL_OINTMENT | CUTANEOUS | Status: AC
  Administered 2014-06-16: 1 via TOPICAL
  Filled 2014-06-16: qty 22

## 2014-06-16 MED ORDER — LACTATED RINGERS IV SOLN
INTRAVENOUS | Status: DC | PRN
  Administered 2014-06-16 (×2): via INTRAVENOUS

## 2014-06-16 MED ORDER — HYDROMORPHONE HCL 1 MG/ML IJ SOLN
0.2500 mg | INTRAMUSCULAR | Status: DC | PRN
  Administered 2014-06-16 (×4): 0.5 mg via INTRAVENOUS

## 2014-06-16 MED ORDER — PROMETHAZINE HCL 25 MG/ML IJ SOLN: 6.2500 mg | INTRAMUSCULAR | Status: DC | PRN

## 2014-06-16 MED ORDER — FINASTERIDE 5 MG PO TABS
5.0000 mg | ORAL_TABLET | Freq: Every day | ORAL | Status: DC
  Administered 2014-06-16: 5 mg via ORAL
  Filled 2014-06-16 (×2): qty 1

## 2014-06-16 MED ORDER — LIDOCAINE HCL (CARDIAC) 20 MG/ML IV SOLN
INTRAVENOUS | Status: DC | PRN
  Administered 2014-06-16: 50 mg via INTRAVENOUS

## 2014-06-16 MED ORDER — CEFAZOLIN SODIUM-DEXTROSE 2-3 GM-% IV SOLR
2.0000 g | INTRAVENOUS | Status: AC
  Administered 2014-06-16: 2 g via INTRAVENOUS

## 2014-06-16 MED ORDER — METHOCARBAMOL 500 MG PO TABS
500.0000 mg | ORAL_TABLET | Freq: Four times a day (QID) | ORAL | Status: DC | PRN
  Administered 2014-06-16: 500 mg via ORAL

## 2014-06-16 MED ORDER — SODIUM CHLORIDE 0.9 % IV SOLN: 250.0000 mL | INTRAVENOUS | Status: DC

## 2014-06-16 MED ORDER — PHENYLEPHRINE 40 MCG/ML (10ML) SYRINGE FOR IV PUSH (FOR BLOOD PRESSURE SUPPORT)
PREFILLED_SYRINGE | INTRAVENOUS | Status: AC
  Filled 2014-06-16: qty 20

## 2014-06-16 MED ORDER — ACETAMINOPHEN 10 MG/ML IV SOLN: 1000.0000 mg | Freq: Once | INTRAVENOUS | Status: DC

## 2014-06-16 MED ORDER — BUPIVACAINE HCL (PF) 0.25 % IJ SOLN
INTRAMUSCULAR | Status: DC | PRN
  Administered 2014-06-16: 4 mL

## 2014-06-16 MED ORDER — ROCURONIUM BROMIDE 100 MG/10ML IV SOLN
INTRAVENOUS | Status: DC | PRN
  Administered 2014-06-16: 40 mg via INTRAVENOUS

## 2014-06-16 MED ORDER — PHENOL 1.4 % MT LIQD: 1.0000 | OROMUCOSAL | Status: DC | PRN

## 2014-06-16 MED ORDER — MIDAZOLAM HCL 5 MG/5ML IJ SOLN
INTRAMUSCULAR | Status: DC | PRN
  Administered 2014-06-16: 1 mg via INTRAVENOUS

## 2014-06-16 MED ORDER — GLYCOPYRROLATE 0.2 MG/ML IJ SOLN
INTRAMUSCULAR | Status: DC | PRN
  Administered 2014-06-16: 0.4 mg via INTRAVENOUS

## 2014-06-16 MED ORDER — MEPERIDINE HCL 25 MG/ML IJ SOLN: 6.2500 mg | INTRAMUSCULAR | Status: DC | PRN

## 2014-06-16 MED ORDER — PROPOFOL 10 MG/ML IV BOLUS
INTRAVENOUS | Status: AC
  Filled 2014-06-16: qty 20

## 2014-06-16 MED ORDER — SUCCINYLCHOLINE CHLORIDE 20 MG/ML IJ SOLN
INTRAMUSCULAR | Status: DC | PRN
  Administered 2014-06-16 (×2): 100 mg via INTRAVENOUS

## 2014-06-16 MED ORDER — THROMBIN 5000 UNITS EX SOLR
OROMUCOSAL | Status: DC | PRN
  Administered 2014-06-16: 5 mL via TOPICAL

## 2014-06-16 MED ORDER — KETOROLAC TROMETHAMINE 30 MG/ML IJ SOLN: 30.0000 mg | Freq: Once | INTRAMUSCULAR | Status: DC | PRN

## 2014-06-16 MED ORDER — SODIUM CHLORIDE 0.9 % IJ SOLN: 3.0000 mL | Freq: Two times a day (BID) | INTRAMUSCULAR | Status: DC

## 2014-06-16 MED ORDER — DEXAMETHASONE SODIUM PHOSPHATE 4 MG/ML IJ SOLN
4.0000 mg | Freq: Four times a day (QID) | INTRAMUSCULAR | Status: DC
  Administered 2014-06-16 – 2014-06-17 (×2): 4 mg via INTRAVENOUS
  Filled 2014-06-16 (×2): qty 1

## 2014-06-16 MED ORDER — 0.9 % SODIUM CHLORIDE (POUR BTL) OPTIME
TOPICAL | Status: DC | PRN
  Administered 2014-06-16: 1000 mL

## 2014-06-16 MED ORDER — CEFAZOLIN SODIUM 1-5 GM-% IV SOLN
1.0000 g | Freq: Three times a day (TID) | INTRAVENOUS | Status: AC
  Administered 2014-06-16 – 2014-06-17 (×2): 1 g via INTRAVENOUS
  Filled 2014-06-16 (×2): qty 50

## 2014-06-16 MED ORDER — METHOCARBAMOL 500 MG PO TABS
ORAL_TABLET | ORAL | Status: AC
  Administered 2014-06-16: 500 mg via ORAL
  Filled 2014-06-16: qty 1

## 2014-06-16 MED ORDER — HYDROMORPHONE HCL 1 MG/ML IJ SOLN
INTRAMUSCULAR | Status: AC
  Administered 2014-06-16: 0.5 mg via INTRAVENOUS
  Filled 2014-06-16: qty 1

## 2014-06-16 MED ORDER — METOPROLOL TARTRATE 25 MG PO TABS
25.0000 mg | ORAL_TABLET | Freq: Two times a day (BID) | ORAL | Status: DC
  Administered 2014-06-16 – 2014-06-17 (×2): 25 mg via ORAL
  Filled 2014-06-16 (×2): qty 1

## 2014-06-16 MED ORDER — ACETAMINOPHEN 325 MG PO TABS: 650.0000 mg | ORAL_TABLET | ORAL | Status: DC | PRN

## 2014-06-16 MED ORDER — ONDANSETRON HCL 4 MG/2ML IJ SOLN: 4.0000 mg | INTRAMUSCULAR | Status: DC | PRN

## 2014-06-16 MED ORDER — MENTHOL 3 MG MT LOZG: 1.0000 | LOZENGE | OROMUCOSAL | Status: DC | PRN

## 2014-06-16 MED ORDER — DILTIAZEM HCL ER COATED BEADS 180 MG PO CP24
180.0000 mg | ORAL_CAPSULE | Freq: Every day | ORAL | Status: DC
  Administered 2014-06-17: 180 mg via ORAL
  Filled 2014-06-16: qty 1

## 2014-06-16 MED ORDER — MUPIROCIN 2 % EX OINT
1.0000 "application " | TOPICAL_OINTMENT | Freq: Once | CUTANEOUS | Status: AC
  Administered 2014-06-16: 1 via TOPICAL

## 2014-06-16 MED ORDER — MIDAZOLAM HCL 2 MG/2ML IJ SOLN
INTRAMUSCULAR | Status: AC
  Filled 2014-06-16: qty 2

## 2014-06-16 MED ORDER — SODIUM CHLORIDE 0.9 % IR SOLN
Status: DC | PRN
  Administered 2014-06-16: 500 mL

## 2014-06-16 MED ORDER — GABAPENTIN 300 MG PO CAPS
600.0000 mg | ORAL_CAPSULE | Freq: Three times a day (TID) | ORAL | Status: DC
  Administered 2014-06-16 – 2014-06-17 (×2): 600 mg via ORAL
  Filled 2014-06-16 (×2): qty 2

## 2014-06-16 MED ORDER — NEOSTIGMINE METHYLSULFATE 10 MG/10ML IV SOLN
INTRAVENOUS | Status: AC
  Filled 2014-06-16: qty 2

## 2014-06-16 MED ORDER — DEXAMETHASONE 4 MG PO TABS
4.0000 mg | ORAL_TABLET | Freq: Four times a day (QID) | ORAL | Status: DC
  Administered 2014-06-16: 4 mg via ORAL
  Filled 2014-06-16: qty 1

## 2014-06-16 MED ORDER — MORPHINE SULFATE 2 MG/ML IJ SOLN: 1.0000 mg | INTRAMUSCULAR | Status: DC | PRN

## 2014-06-16 MED ORDER — ONDANSETRON HCL 4 MG/2ML IJ SOLN
INTRAMUSCULAR | Status: AC
  Filled 2014-06-16: qty 2

## 2014-06-16 MED ORDER — EPHEDRINE SULFATE 50 MG/ML IJ SOLN
INTRAMUSCULAR | Status: DC | PRN
  Administered 2014-06-16: 5 mg via INTRAVENOUS
  Administered 2014-06-16 (×2): 10 mg via INTRAVENOUS

## 2014-06-16 MED ORDER — DEXAMETHASONE SODIUM PHOSPHATE 10 MG/ML IJ SOLN: 10.0000 mg | INTRAMUSCULAR | Status: DC

## 2014-06-16 MED ORDER — PHENYLEPHRINE HCL 10 MG/ML IJ SOLN
INTRAMUSCULAR | Status: DC | PRN
  Administered 2014-06-16 (×5): 80 ug via INTRAVENOUS

## 2014-06-16 MED ORDER — ROCURONIUM BROMIDE 50 MG/5ML IV SOLN
INTRAVENOUS | Status: AC
  Filled 2014-06-16: qty 1

## 2014-06-16 MED ORDER — CEFAZOLIN SODIUM-DEXTROSE 2-3 GM-% IV SOLR
INTRAVENOUS | Status: AC
  Filled 2014-06-16: qty 50

## 2014-06-16 MED ORDER — POTASSIUM CHLORIDE IN NACL 20-0.9 MEQ/L-% IV SOLN
INTRAVENOUS | Status: DC
  Administered 2014-06-16: 75 mL/h via INTRAVENOUS
  Filled 2014-06-16: qty 1000

## 2014-06-16 MED ORDER — FENTANYL CITRATE (PF) 250 MCG/5ML IJ SOLN
INTRAMUSCULAR | Status: AC
  Filled 2014-06-16: qty 5

## 2014-06-16 MED ORDER — THROMBIN 5000 UNITS EX SOLR
CUTANEOUS | Status: DC | PRN
  Administered 2014-06-16 (×2): 5000 [IU] via TOPICAL

## 2014-06-16 MED ORDER — PHENYLEPHRINE HCL 10 MG/ML IJ SOLN
10.0000 mg | INTRAMUSCULAR | Status: DC | PRN
  Administered 2014-06-16: 50 ug/min via INTRAVENOUS

## 2014-06-16 MED ORDER — ACETAMINOPHEN 650 MG RE SUPP: 650.0000 mg | RECTAL | Status: DC | PRN

## 2014-06-16 MED ORDER — PROPOFOL 10 MG/ML IV BOLUS
INTRAVENOUS | Status: DC | PRN
  Administered 2014-06-16: 150 mg via INTRAVENOUS
  Administered 2014-06-16: 50 mg via INTRAVENOUS

## 2014-06-16 MED ORDER — TERAZOSIN HCL 5 MG PO CAPS
10.0000 mg | ORAL_CAPSULE | Freq: Every day | ORAL | Status: DC
  Administered 2014-06-16: 10 mg via ORAL
  Filled 2014-06-16 (×2): qty 2

## 2014-06-16 MED ORDER — METHOCARBAMOL 1000 MG/10ML IJ SOLN
500.0000 mg | Freq: Four times a day (QID) | INTRAMUSCULAR | Status: DC | PRN
  Filled 2014-06-16: qty 5

## 2014-06-16 MED ORDER — DEXAMETHASONE SODIUM PHOSPHATE 10 MG/ML IJ SOLN
INTRAMUSCULAR | Status: AC
  Filled 2014-06-16: qty 1

## 2014-06-16 MED ORDER — HEMOSTATIC AGENTS (NO CHARGE) OPTIME
TOPICAL | Status: DC | PRN
  Administered 2014-06-16: 1 via TOPICAL

## 2014-06-16 MED ORDER — GLYCOPYRROLATE 0.2 MG/ML IJ SOLN
INTRAMUSCULAR | Status: AC
  Filled 2014-06-16: qty 2

## 2014-06-16 MED ORDER — ONDANSETRON HCL 4 MG/2ML IJ SOLN
INTRAMUSCULAR | Status: DC | PRN
  Administered 2014-06-16: 4 mg via INTRAVENOUS

## 2014-06-16 MED ORDER — LIDOCAINE HCL (CARDIAC) 20 MG/ML IV SOLN
INTRAVENOUS | Status: AC
  Filled 2014-06-16: qty 5

## 2014-06-16 MED ORDER — NEOSTIGMINE METHYLSULFATE 10 MG/10ML IV SOLN
INTRAVENOUS | Status: DC | PRN
  Administered 2014-06-16: 3 mg via INTRAVENOUS

## 2014-06-16 MED ORDER — SODIUM CHLORIDE 0.9 % IJ SOLN: 3.0000 mL | INTRAMUSCULAR | Status: DC | PRN

## 2014-06-16 MED ORDER — FENTANYL CITRATE (PF) 100 MCG/2ML IJ SOLN
INTRAMUSCULAR | Status: DC | PRN
  Administered 2014-06-16 (×3): 100 ug via INTRAVENOUS
  Administered 2014-06-16: 50 ug via INTRAVENOUS

## 2014-06-16 MED ORDER — LACTATED RINGERS IV SOLN
INTRAVENOUS | Status: DC
  Administered 2014-06-16: 13:00:00 via INTRAVENOUS

## 2014-06-16 MED ORDER — HYDROCODONE-ACETAMINOPHEN 5-325 MG PO TABS
1.0000 | ORAL_TABLET | ORAL | Status: DC | PRN
  Administered 2014-06-17: 2 via ORAL
  Filled 2014-06-16: qty 2

## 2014-06-16 SURGICAL SUPPLY — 57 items
APL SKNCLS STERI-STRIP NONHPOA (GAUZE/BANDAGES/DRESSINGS) ×1
BAG DECANTER FOR FLEXI CONT (MISCELLANEOUS) ×3 IMPLANT
BENZOIN TINCTURE PRP APPL 2/3 (GAUZE/BANDAGES/DRESSINGS) ×3 IMPLANT
BONE ALLOSTEM CUBE 1CC/10MM3 (Bone Implant) ×2 IMPLANT
BUR MATCHSTICK NEURO 3.0 LAGG (BURR) ×3 IMPLANT
CAGE LORDOTIC 8 SM PLUS (Cage) ×2 IMPLANT
CAGE SMALL 7X13X15 (Cage) ×2 IMPLANT
CANISTER SUCT 3000ML PPV (MISCELLANEOUS) ×3 IMPLANT
CLOSURE WOUND 1/2 X4 (GAUZE/BANDAGES/DRESSINGS) ×1
CONT SPEC 4OZ CLIKSEAL STRL BL (MISCELLANEOUS) ×3 IMPLANT
DRAPE C-ARM 42X72 X-RAY (DRAPES) ×6 IMPLANT
DRAPE LAPAROTOMY 100X72 PEDS (DRAPES) ×3 IMPLANT
DRAPE MICROSCOPE LEICA (MISCELLANEOUS) ×3 IMPLANT
DRAPE POUCH INSTRU U-SHP 10X18 (DRAPES) ×3 IMPLANT
DRILL BIT HELIX (BIT) ×2 IMPLANT
DRSG OPSITE 4X5.5 SM (GAUZE/BANDAGES/DRESSINGS) ×1 IMPLANT
DRSG OPSITE POSTOP 3X4 (GAUZE/BANDAGES/DRESSINGS) ×2 IMPLANT
DRSG TELFA 3X8 NADH (GAUZE/BANDAGES/DRESSINGS) IMPLANT
DURAPREP 6ML APPLICATOR 50/CS (WOUND CARE) ×3 IMPLANT
ELECT COATED BLADE 2.86 ST (ELECTRODE) ×3 IMPLANT
ELECT REM PT RETURN 9FT ADLT (ELECTROSURGICAL) ×3
ELECTRODE REM PT RTRN 9FT ADLT (ELECTROSURGICAL) ×1 IMPLANT
GAUZE SPONGE 4X4 16PLY XRAY LF (GAUZE/BANDAGES/DRESSINGS) IMPLANT
GLOVE BIO SURGEON STRL SZ8 (GLOVE) ×5 IMPLANT
GLOVE BIOGEL PI IND STRL 7.5 (GLOVE) IMPLANT
GLOVE BIOGEL PI INDICATOR 7.5 (GLOVE) ×4
GLOVE ECLIPSE 7.5 STRL STRAW (GLOVE) ×6 IMPLANT
GLOVE SURG SS PI 7.0 STRL IVOR (GLOVE) ×4 IMPLANT
GOWN STRL REUS W/ TWL LRG LVL3 (GOWN DISPOSABLE) IMPLANT
GOWN STRL REUS W/ TWL XL LVL3 (GOWN DISPOSABLE) ×1 IMPLANT
GOWN STRL REUS W/TWL 2XL LVL3 (GOWN DISPOSABLE) IMPLANT
GOWN STRL REUS W/TWL LRG LVL3 (GOWN DISPOSABLE)
GOWN STRL REUS W/TWL XL LVL3 (GOWN DISPOSABLE) ×9
HEMOSTAT POWDER KIT SURGIFOAM (HEMOSTASIS) ×3 IMPLANT
KIT BASIN OR (CUSTOM PROCEDURE TRAY) ×3 IMPLANT
KIT ROOM TURNOVER OR (KITS) ×3 IMPLANT
NDL HYPO 25X1 1.5 SAFETY (NEEDLE) ×1 IMPLANT
NDL SPNL 20GX3.5 QUINCKE YW (NEEDLE) ×1 IMPLANT
NEEDLE HYPO 25X1 1.5 SAFETY (NEEDLE) ×3 IMPLANT
NEEDLE SPNL 20GX3.5 QUINCKE YW (NEEDLE) ×3 IMPLANT
NS IRRIG 1000ML POUR BTL (IV SOLUTION) ×3 IMPLANT
PACK LAMINECTOMY NEURO (CUSTOM PROCEDURE TRAY) ×3 IMPLANT
PAD ARMBOARD 7.5X6 YLW CONV (MISCELLANEOUS) ×9 IMPLANT
PAD DRESSING TELFA 3X8 NADH (GAUZE/BANDAGES/DRESSINGS) ×1 IMPLANT
PIN DISTRACTION 14MM (PIN) ×4 IMPLANT
PLATE ARCHON 2-LEVEL 44MM (Plate) ×2 IMPLANT
RUBBERBAND STERILE (MISCELLANEOUS) ×6 IMPLANT
SCREW ARCHON SELFTAP 4.0X13 (Screw) ×12 IMPLANT
SPONGE INTESTINAL PEANUT (DISPOSABLE) ×3 IMPLANT
SPONGE SURGIFOAM ABS GEL SZ50 (HEMOSTASIS) ×3 IMPLANT
STRIP CLOSURE SKIN 1/2X4 (GAUZE/BANDAGES/DRESSINGS) ×2 IMPLANT
SUT VIC AB 3-0 SH 8-18 (SUTURE) ×6 IMPLANT
SYR 20ML ECCENTRIC (SYRINGE) ×3 IMPLANT
TOWEL OR 17X24 6PK STRL BLUE (TOWEL DISPOSABLE) ×3 IMPLANT
TOWEL OR 17X26 10 PK STRL BLUE (TOWEL DISPOSABLE) ×3 IMPLANT
TRAP SPECIMEN MUCOUS 40CC (MISCELLANEOUS) ×2 IMPLANT
WATER STERILE IRR 1000ML POUR (IV SOLUTION) ×3 IMPLANT

## 2014-06-16 NOTE — Anesthesia Preprocedure Evaluation (Addendum)
Anesthesia Evaluation  Patient identified by MRN, date of birth, ID band Patient awake    Reviewed: Allergy & Precautions, NPO status , Patient's Chart, lab work & pertinent test results, reviewed documented beta blocker date and time   History of Anesthesia Complications Negative for: history of anesthetic complications  Airway Mallampati: II  TM Distance: >3 FB Neck ROM: Full    Dental no notable dental hx.    Pulmonary COPDCurrent Smoker,  breath sounds clear to auscultation  Pulmonary exam normal       Cardiovascular hypertension, Pt. on home beta blockers and Pt. on medications + CAD and + Peripheral Vascular Disease Rhythm:Regular Rate:Normal  Echo  2008 - Overall left ventricular systolic function was normal. Left ventricular ejection fraction was estimated , range being 60% to 65 %. There were no left ventricular regional wall motion abnormalities. - Aortic valve thickness was mildly increased.    Neuro/Psych negative neurological ROS  negative psych ROS   GI/Hepatic negative GI ROS, Neg liver ROS, GERD-  ,  Endo/Other  negative endocrine ROS  Renal/GU negative Renal ROS  negative genitourinary   Musculoskeletal negative musculoskeletal ROS (+)   Abdominal   Peds negative pediatric ROS (+)  Hematology negative hematology ROS (+)   Anesthesia Other Findings   Reproductive/Obstetrics negative OB ROS                           Anesthesia Physical Anesthesia Plan  ASA: III  Anesthesia Plan: General   Post-op Pain Management:    Induction: Intravenous  Airway Management Planned: Oral ETT  Additional Equipment:   Intra-op Plan:   Post-operative Plan: Extubation in OR  Informed Consent: I have reviewed the patients History and Physical, chart, labs and discussed the procedure including the risks, benefits and alternatives for the proposed anesthesia with the patient or  authorized representative who has indicated his/her understanding and acceptance.   Dental advisory given  Plan Discussed with: CRNA  Anesthesia Plan Comments:        Anesthesia Quick Evaluation

## 2014-06-16 NOTE — H&P (Signed)
Subjective:   Patient is a 74 y.o. male admitted for right hand weakness with pain in the right arm. The patient first presented to me with complaints of neck pain, arm pain and loss of strength of the arm(s). Onset of symptoms was 2 weeks ago. The pain is described as aching, sharp and stabbing and occurs all day. The pain is rated severe, and is located  In the neck and radiates to the right arm. The symptoms have been progressive. Symptoms are exacerbated by extending head backwards, and are relieved by none.  Previous work up includes MRI of cervical spine, results: spinal stenosis.  Past Medical History  Diagnosis Date  . Hypertension   . GERD (gastroesophageal reflux disease)   . Hyperlipidemia   . History of colon polyps   . BPH (benign prostatic hypertrophy)   . Microscopic hematuria   . ED (erectile dysfunction)   . Tobacco abuse   . A-fib 2013  . Lumbar herniated disc   . Spinal stenosis   . CAD (coronary artery disease)   . Complication of anesthesia     " I fainted a couple of times when they went to get me up."  . Cancer     melanoma of the neck  . Early cataract   . Hernia, abdominal     small    Past Surgical History  Procedure Laterality Date  . Inguinal hernia repair    . Tonsillectomy    . Colonoscopy  2002  . Back surgery    . Tonsillectomy      No Known Allergies  History  Substance Use Topics  . Smoking status: Current Every Day Smoker -- 1.00 packs/day for 60 years    Types: Cigarettes  . Smokeless tobacco: Never Used  . Alcohol Use: No    Family History  Problem Relation Age of Onset  . Diabetes Father   . Coronary artery disease Father   . Stroke Father   . Arthritis Mother   . Hiatal hernia Mother   . Colon cancer Neg Hx    Prior to Admission medications   Medication Sig Start Date End Date Taking? Authorizing Provider  aspirin 81 MG tablet Take 81 mg by mouth daily.     Yes Historical Provider, MD  diltiazem (CARDIZEM CD) 180 MG 24 hr  capsule TAKE 1 CAPSULE (180 MG TOTAL) BY MOUTH DAILY. 06/07/14  Yes Minna Merritts, MD  finasteride (PROSCAR) 5 MG tablet TAKE 1 TABLET BY MOUTH EVERY DAY 01/17/14  Yes Jackolyn Confer, MD  gabapentin (NEURONTIN) 300 MG capsule Take 600 mg by mouth 3 (three) times daily.    Yes Historical Provider, MD  HYDROcodone-acetaminophen (NORCO/VICODIN) 5-325 MG per tablet Take 1-2 tablets by mouth 3 (three) times daily as needed for moderate pain or severe pain. 05/27/14  Yes Jackolyn Confer, MD  methylPREDNISolone (MEDROL DOSEPAK) 4 MG TBPK tablet Day 1: 2 tabs in AM, 1 tab after lunch,1 tab after supper, and 2 tabs at bed  Day 2: 1 tab in AM, 1 tab after lunch, 1 tab after supper, and 2 tabs at bed Day 3: 1 tab in AM, 1 tab after lunch,  1 tab after supper, and 1 tab at bed Day 4: 1 tab in AM, 1 tab after lunch, and 1 tab at bed Day 5:4 mg (1 tablet) before breakfast and 4 mg (1 tablet) at bedtime Day 6: 4 mg on day 6 administered as 4 mg (1 tablet) before breakfast  06/09/14  Yes Doy Hutching, MD  metoprolol tartrate (LOPRESSOR) 25 MG tablet TAKE 1 TABLET (25 MG TOTAL) BY MOUTH 2 (TWO) TIMES DAILY. 03/01/13  Yes Minna Merritts, MD  simvastatin (ZOCOR) 10 MG tablet TAKE 1 TABLET (10 MG TOTAL) BY MOUTH AT BEDTIME. 03/01/13  Yes Minna Merritts, MD  terazosin (HYTRIN) 10 MG capsule TAKE ONE CAPSULE BY MOUTH AT BEDTIME 10/15/13  Yes Jackolyn Confer, MD  allopurinol (ZYLOPRIM) 300 MG tablet TAKE 1 TABLET BY MOUTH EVERY DAY Patient not taking: Reported on 06/13/2014 10/15/13   Jackolyn Confer, MD  colchicine 0.6 MG tablet Take 1 tablet (0.6 mg total) by mouth 2 (two) times daily as needed. Patient not taking: Reported on 06/13/2014 09/30/12 12/03/14  Jackolyn Confer, MD  meloxicam (MOBIC) 15 MG tablet Take 1 tablet (15 mg total) by mouth daily. Patient not taking: Reported on 06/13/2014 11/03/13   Lyndal Pulley, DO  traMADol (ULTRAM) 50 MG tablet Take 1-2 tablets (50-100 mg total) by mouth 2 (two) times  daily. Patient not taking: Reported on 06/13/2014 10/12/13   Jackolyn Confer, MD     Review of Systems  Positive ROS: neg  All other systems have been reviewed and were otherwise negative with the exception of those mentioned in the HPI and as above.  Objective: Vital signs in last 24 hours: Temp:  [97.7 F (36.5 C)] 97.7 F (36.5 C) (04/28 1220) Pulse Rate:  [56] 56 (04/28 1220) Resp:  [16] 16 (04/28 1220) BP: (130)/(65) 130/65 mmHg (04/28 1220) SpO2:  [97 %] 97 % (04/28 1220) Weight:  [196 lb (88.905 kg)] 196 lb (88.905 kg) (04/28 1220)  General Appearance: Alert, cooperative, no distress, appears stated age Head: Normocephalic, without obvious abnormality, atraumatic Eyes: PERRL, conjunctiva/corneas clear, EOM's intact      Neck: Supple, symmetrical, trachea midline, Back: Symmetric, no curvature, ROM normal, no CVA tenderness Lungs:  respirations unlabored Heart: Regular rate and rhythm Abdomen: Soft, non-tender Extremities: Extremities normal, atraumatic, no cyanosis or edema Pulses: 2+ and symmetric all extremities Skin: Skin color, texture, turgor normal, no rashes or lesions  NEUROLOGIC:  Mental status: Alert and oriented x4, no aphasia, good attention span, fund of knowledge and memory  Motor Exam - grossly normal except for weakness of the right hand, especially with finger extension Sensory Exam - grossly normal, no Tinel's sign over PIN Reflexes: 1+ Coordination - grossly normal Gait - grossly normal Balance - grossly normal Cranial Nerves: I: smell Not tested  II: visual acuity  OS: nl    OD: nl  II: visual fields Full to confrontation  II: pupils Equal, round, reactive to light  III,VII: ptosis None  III,IV,VI: extraocular muscles  Full ROM  V: mastication Normal  V: facial light touch sensation  Normal  V,VII: corneal reflex  Present  VII: facial muscle function - upper  Normal  VII: facial muscle function - lower Normal  VIII: hearing Not tested   IX: soft palate elevation  Normal  IX,X: gag reflex Present  XI: trapezius strength  5/5  XI: sternocleidomastoid strength 5/5  XI: neck flexion strength  5/5  XII: tongue strength  Normal    Data Review Lab Results  Component Value Date   WBC 11.2* 06/09/2014   HGB 15.8 06/09/2014   HCT 46.1 06/09/2014   MCV 88.7 06/09/2014   PLT 178 06/09/2014   Lab Results  Component Value Date   NA 141 06/09/2014   K 3.9 06/09/2014  CL 109 06/09/2014   CO2 23 06/09/2014   BUN 15 06/09/2014   CREATININE 0.93 06/09/2014   GLUCOSE 101* 06/09/2014   Lab Results  Component Value Date   INR 1.04 06/16/2014    Assessment:   Cervical neck pain with herniated nucleus pulposus/ spondylosis/ stenosis at the C56 and C6-7. Patient has failed conservative therapy. Planned surgery : ACDF with plating C5-6 and C6-7  Plan:   I explained the condition and procedure to the patient and answered any questions.  Patient wishes to proceed with procedure as planned. Understands risks/ benefits/ and expected or typical outcomes.  Haasini Patnaude S 06/16/2014 1:22 PM

## 2014-06-16 NOTE — Anesthesia Postprocedure Evaluation (Signed)
  Anesthesia Post-op Note  Patient: Greg Hughes  Procedure(s) Performed: Procedure(s): ANTERIOR CERVICAL DECOMPRESSION/DISCECTOMY FUSION CERVICAL FIVE-SIX,CERVICAL SIX-SEVEN (N/A)  Patient Location: PACU  Anesthesia Type: General   Level of Consciousness: awake, alert  and oriented  Airway and Oxygen Therapy: Patient Spontanous Breathing  Post-op Pain: mild  Post-op Assessment: Post-op Vital signs reviewed  Post-op Vital Signs: Reviewed  Last Vitals:  Filed Vitals:   06/16/14 1630  BP: 121/60  Pulse: 57  Temp: 36.8 C  Resp: 17    Complications: No apparent anesthesia complications

## 2014-06-16 NOTE — Anesthesia Procedure Notes (Signed)
Procedure Name: Intubation Date/Time: 06/16/2014 2:03 PM Performed by: Rebekah Chesterfield L Pre-anesthesia Checklist: Patient identified, Emergency Drugs available, Suction available, Patient being monitored and Timeout performed Patient Re-evaluated:Patient Re-evaluated prior to inductionOxygen Delivery Method: Circle system utilized Preoxygenation: Pre-oxygenation with 100% oxygen Intubation Type: IV induction and Cricoid Pressure applied Ventilation: Mask ventilation without difficulty Laryngoscope Size: Mac, 4, Miller and 3 Grade View: Grade IV Tube type: Oral Tube size: 7.5 mm Number of attempts: 3 Airway Equipment and Method: Stylet and Video-laryngoscopy Placement Confirmation: ETT inserted through vocal cords under direct vision,  positive ETCO2 and breath sounds checked- equal and bilateral Secured at: 22 cm Tube secured with: Tape Dental Injury: Bloody posterior oropharynx  Difficulty Due To: Difficult Airway- due to limited oral opening and Difficult Airway- due to anterior larynx Future Recommendations: Recommend- induction with short-acting agent, and alternative techniques readily available Comments: Nov vsualization of glottic structures with laryngoscope, adequate view of cords c glide scope difficult to manipulate ett through cords success getting tip of ett into glottic opening and spinning ett in under dv with stylet pulled back some blood noted in retropharynx p attempts dentition intact

## 2014-06-16 NOTE — Transfer of Care (Signed)
Immediate Anesthesia Transfer of Care Note  Patient: Greg Hughes  Procedure(s) Performed: Procedure(s): ANTERIOR CERVICAL DECOMPRESSION/DISCECTOMY FUSION CERVICAL FIVE-SIX,CERVICAL SIX-SEVEN (N/A)  Patient Location: PACU  Anesthesia Type:General  Level of Consciousness: awake, alert , oriented and patient cooperative  Airway & Oxygen Therapy: Patient Spontanous Breathing and Patient connected to nasal cannula oxygen  Post-op Assessment: Report given to RN, Post -op Vital signs reviewed and stable and Patient moving all extremities  Post vital signs: Reviewed and stable  Last Vitals:  Filed Vitals:   06/16/14 1630  BP:   Pulse:   Temp: 36.8 C  Resp:     Complications: No apparent anesthesia complications

## 2014-06-16 NOTE — Op Note (Signed)
06/16/2014  4:11 PM  PATIENT:  Greg Hughes  74 y.o. 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 Decompressive anterior cervical discectomy C5-6 C6-7, 2. Anterior cervical arthrodesis C5-6 C6-7 utilizing peek interbody cages packed with local autograft and morcellized allograft ricks intracervical plating C5-C7 inclusive utilizing a Nuvasive archon plate  SURGEON:  Sherley Bounds, MD  ASSISTANTS: Dr. Dillard Essex  ANESTHESIA:   General  EBL: 200 ml  Total I/O In: 1000 [I.V.:1000] Out: 200 [Blood:200]  BLOOD ADMINISTERED:none  DRAINS: none   SPECIMEN:  No Specimen  INDICATION FOR PROCEDURE: this patient presented with severe right arm pain as well as acute weakness in the right hand. Her eye showed spinal stenosis at C5-6 and C6-7 with severe foraminal stenosis on the right. The differential diagnosis of this certainly included PIN syndrome, brachial plexitis, cervical radiculopathy, peripheral neuropathy, and others with his imaging finding and his pain I felt this was probably radiculopathy. I did not feel with the weakness in his hand that we had the fluctuant area waiting 2-3 weeks for EMG changes to occur. Recommended ACDF with plating at C5-6 and C6-7. Patient understood the risks, benefits, and alternatives and potential outcomes and wished to proceed.  PROCEDURE DETAILS: Patient was brought to the operating room placed under general endotracheal anesthesia. Patient was placed in the supine position on the operating room table. The neck was prepped with Duraprep and draped in a sterile fashion.   Three cc of local anesthesia was injected and a transverse incision was made on the right side of the neck.  Dissection was carried down thru the subcutaneous tissue and the platysma was  elevated, opened, and undermined with Metzenbaum scissors.  Dissection  was then carried out thru an avascular plane leaving the sternocleidomastoid carotid artery and jugular vein laterally and the trachea and esophagus medially. The ventral aspect of the vertebral column was identified and a localizing x-ray was taken. The C5-6 level was identified. The longus colli muscles were then elevated and the retractor was placedto expose C5-6 and C6-7. The annulus was incised at both levels and the disc space entered. Discectomy was performed with micro-curettes and pituitary rongeurs. I then used the high-speed drill to drill the endplates down to the level of the posterior longitudinal ligament. The drill shavings were saved in a mucous trap for later arthrodesis. The operating microscope was draped and brought into the field provided additional magnification, illumination and visualization. Discectomy was continued posteriorly thru the disc space. Posterior longitudinal ligament was opened with a nerve hook, and then removed along with disc herniation and osteophytes, decompressing the spinal canal and thecal sac. We then continued to remove osteophytic overgrowth and disc material decompressing the neural foramina and exiting nerve roots bilaterally. There was severe neural foraminal stenosis at C5-6 and C6-7 on the right. Generous foraminotomies were performed until I was distal to the pedicle.The scope was angled up and down to help decompress and undercut the vertebral bodies. Once the decompression was completed we could pass a nerve hook circumferentially to assure adequate decompression in the midline and in the neural foramina. So by both visualization and palpation we felt we had an adequate decompression of the neural elements. We then measured the height of the intravertebral disc space and selected a 8 millimeter Peek interbody cage packed with autograft and morcellized allograft. It was then gently positioned in  the intravertebral disc space at both levels and countersunk. I  then used a archon  plate and placed variable angle screws into the vertebral bodies and locked them into position. The wound was irrigated with bacitracin solution, checked for hemostasis which was established and confirmed. Once meticulous hemostasis was achieved, we then proceeded with closure. The platysma was closed with interrupted 3-0 undyed Vicryl suture, the subcuticular layer was closed with interrupted 3-0 undyed Vicryl suture. The skin edges were approximated with steristrips. The drapes were removed. A sterile dressing was applied. The patient was then awakened from general anesthesia and transferred to the recovery room in stable condition. At the end of the procedure all sponge, needle and instrument counts were correct.   PLAN OF CARE: Admit to inpatient   PATIENT DISPOSITION:  PACU - hemodynamically stable.   Delay start of Pharmacological VTE agent (>24hrs) due to surgical blood loss or risk of bleeding:  yes

## 2014-06-17 ENCOUNTER — Encounter (HOSPITAL_COMMUNITY): Payer: Self-pay | Admitting: Neurological Surgery

## 2014-06-17 LAB — GLUCOSE, CAPILLARY: Glucose-Capillary: 177 mg/dL — ABNORMAL HIGH (ref 70–99)

## 2014-06-17 MED ORDER — ALUM & MAG HYDROXIDE-SIMETH 200-200-20 MG/5ML PO SUSP
30.0000 mL | Freq: Four times a day (QID) | ORAL | Status: DC | PRN
  Administered 2014-06-17: 30 mL via ORAL
  Filled 2014-06-17: qty 30

## 2014-06-17 MED ORDER — HYDROCODONE-ACETAMINOPHEN 5-325 MG PO TABS: 1.0000 | ORAL_TABLET | Freq: Four times a day (QID) | ORAL | Status: DC | PRN

## 2014-06-17 MED ORDER — METHOCARBAMOL 500 MG PO TABS: 500.0000 mg | ORAL_TABLET | Freq: Three times a day (TID) | ORAL | Status: DC | PRN

## 2014-06-17 NOTE — Discharge Instructions (Signed)
Spinal Fusion Care After Refer to this sheet in the next few weeks. These instructions provide you with information on caring for yourself after your procedure. Your caregiver may also give you more specific instructions. Your treatment has been planned according to current medical practices, but problems sometimes occur. Call your caregiver if you have any problems or questions after your procedure. HOME CARE INSTRUCTIONS   Take whatever pain medicine has been prescribed by your caregiver. Do not take over-the-counter pain medicine unless directed otherwise by your caregiver.  Do not drive if you are taking narcotic pain medicines.  Change your bandage (dressing) if necessary or as directed by your caregiver.  Do not get your surgical cut (incision) wet. After a few days you may take quick showers (rather than baths), but keep your incision clean and dry. Covering the incision with plastic wrap while you shower should keep your incision dry. A few weeks after surgery, once your incision has healed and your caregiver says it is okay, you can take baths or go swimming.  If you have been prescribed medicine to prevent your blood from clotting, follow the directions carefully.  Check the area around your incision often. Look for redness and swelling. Also, look for anything leaking from your wound. You can use a mirror or have a family member inspect your incision if it is in a place where it is difficult for you to see.  Ask your caregiver what activities you should avoid and for how long.  Walk as much as possible.  Do not lift anything heavier than 10 pounds (4.5 kilograms) until your caregiver says it is safe.  Do not twist or bend for a few weeks. Try not to pull on things. Avoid sitting for long periods of time. Change positions at least every hour.  Ask your caregiver what kinds of exercise you should do to make your back stronger and when you should begin doing these exercises. SEEK  IMMEDIATE MEDICAL CARE IF:   Pain suddenly becomes much worse.  The incision area is red, swollen, bleeding, or leaking fluid.  Your legs or feet become increasingly painful, numb, weak, or swollen.  You have trouble controlling urination or bowel movements.  You have trouble breathing.  You have chest pain.  You have a fever. MAKE SURE YOU:  Understand these instructions.  Will watch your condition.  Will get help right away if you are not doing well or get worse. Document Released: 08/24/2004 Document Revised: 04/29/2011 Document Reviewed: 04/19/2010 [facility] Patient Information 2015 [place], [place]. This information is not intended to replace advice given to you by your health care provider. Make sure you discuss any questions you have with your health care provider.  Acetaminophen; Oxycodone tablets What is this medicine? ACETAMINOPHEN; OXYCODONE (a set a MEE noe fen; ox i KOE done) is a pain reliever. It is used to treat mild to moderate pain. This medicine may be used for other purposes; ask your health care provider or pharmacist if you have questions. COMMON BRAND NAME(S): Endocet, Magnacet, Narvox, Percocet, Perloxx, Primalev, Primlev, Roxicet, Xolox What should I tell my health care provider before I take this medicine? They need to know if you have any of these conditions: -brain tumor -Crohn's disease, inflammatory bowel disease, or ulcerative colitis -drug abuse or addiction -head injury -heart or circulation problems -if you often drink alcohol -kidney disease or problems going to the bathroom -liver disease -lung disease, asthma, or breathing problems -an unusual or allergic reaction to  acetaminophen, oxycodone, other opioid analgesics, other medicines, foods, dyes, or preservatives -pregnant or trying to get pregnant -breast-feeding How should I use this medicine? Take this medicine by mouth with a full glass of water. Follow the directions on the  prescription label. Take your medicine at regular intervals. Do not take your medicine more often than directed. Talk to your pediatrician regarding the use of this medicine in children. Special care may be needed. Patients over 28 years old may have a stronger reaction and need a smaller dose. Overdosage: If you think you have taken too much of this medicine contact a poison control center or emergency room at once. NOTE: This medicine is only for you. Do not share this medicine with others. What if I miss a dose? If you miss a dose, take it as soon as you can. If it is almost time for your next dose, take only that dose. Do not take double or extra doses. What may interact with this medicine? -alcohol -antihistamines -barbiturates like amobarbital, butalbital, butabarbital, methohexital, pentobarbital, phenobarbital, thiopental, and secobarbital -benztropine -drugs for bladder problems like solifenacin, trospium, oxybutynin, tolterodine, hyoscyamine, and methscopolamine -drugs for breathing problems like ipratropium and tiotropium -drugs for certain stomach or intestine problems like propantheline, homatropine methylbromide, glycopyrrolate, atropine, belladonna, and dicyclomine -general anesthetics like etomidate, ketamine, nitrous oxide, propofol, desflurane, enflurane, halothane, isoflurane, and sevoflurane -medicines for depression, anxiety, or psychotic disturbances -medicines for sleep -muscle relaxants -naltrexone -narcotic medicines (opiates) for pain -phenothiazines like perphenazine, thioridazine, chlorpromazine, mesoridazine, fluphenazine, prochlorperazine, promazine, and trifluoperazine -scopolamine -tramadol -trihexyphenidyl This list may not describe all possible interactions. Give your health care provider a list of all the medicines, herbs, non-prescription drugs, or dietary supplements you use. Also tell them if you smoke, drink alcohol, or use illegal drugs. Some items may  interact with your medicine. What should I watch for while using this medicine? Tell your doctor or health care professional if your pain does not go away, if it gets worse, or if you have new or a different type of pain. You may develop tolerance to the medicine. Tolerance means that you will need a higher dose of the medication for pain relief. Tolerance is normal and is expected if you take this medicine for a long time. Do not suddenly stop taking your medicine because you may develop a severe reaction. Your body becomes used to the medicine. This does NOT mean you are addicted. Addiction is a behavior related to getting and using a drug for a non-medical reason. If you have pain, you have a medical reason to take pain medicine. Your doctor will tell you how much medicine to take. If your doctor wants you to stop the medicine, the dose will be slowly lowered over time to avoid any side effects. You may get drowsy or dizzy. Do not drive, use machinery, or do anything that needs mental alertness until you know how this medicine affects you. Do not stand or sit up quickly, especially if you are an older patient. This reduces the risk of dizzy or fainting spells. Alcohol may interfere with the effect of this medicine. Avoid alcoholic drinks. There are different types of narcotic medicines (opiates) for pain. If you take more than one type at the same time, you may have more side effects. Give your health care provider a list of all medicines you use. Your doctor will tell you how much medicine to take. Do not take more medicine than directed. Call emergency for help if you have  problems breathing. The medicine will cause constipation. Try to have a bowel movement at least every 2 to 3 days. If you do not have a bowel movement for 3 days, call your doctor or health care professional. Do not take Tylenol (acetaminophen) or medicines that have acetaminophen with this medicine. Too much acetaminophen can be very  dangerous. Many nonprescription medicines contain acetaminophen. Always read the labels carefully to avoid taking more acetaminophen. What side effects may I notice from receiving this medicine? Side effects that you should report to your doctor or health care professional as soon as possible: -allergic reactions like skin rash, itching or hives, swelling of the face, lips, or tongue -breathing difficulties, wheezing -confusion -light headedness or fainting spells -severe stomach pain -unusually weak or tired -yellowing of the skin or the whites of the eyes Side effects that usually do not require medical attention (report to your doctor or health care professional if they continue or are bothersome): -dizziness -drowsiness -nausea -vomiting This list may not describe all possible side effects. Call your doctor for medical advice about side effects. You may report side effects to FDA at 1-800-FDA-1088. Where should I keep my medicine? Keep out of the reach of children. This medicine can be abused. Keep your medicine in a safe place to protect it from theft. Do not share this medicine with anyone. Selling or giving away this medicine is dangerous and against the law. Store at room temperature between 20 and 25 degrees C (68 and 77 degrees F). Keep container tightly closed. Protect from light. This medicine may cause accidental overdose and death if it is taken by other adults, children, or pets. Flush any unused medicine down the toilet to reduce the chance of harm. Do not use the medicine after the expiration date. NOTE: This sheet is a summary. It may not cover all possible information. If you have questions about this medicine, talk to your doctor, pharmacist, or health care provider.  2015, [employer]. (2012-09-28 13:17:35)  Methocarbamol tablets What is this medicine? METHOCARBAMOL (meth oh KAR ba mole) helps to relieve pain and stiffness in muscles caused by strains,  sprains, or other injury to your muscles. This medicine may be used for other purposes; ask your health care provider or pharmacist if you have questions. COMMON BRAND NAME(S): Robaxin What should I tell my health care provider before I take this medicine? They need to know if you have any of these conditions: -kidney disease -seizures -an unusual or allergic reaction to methocarbamol, other medicines, foods, dyes, or preservatives -pregnant or trying to get pregnant -breast-feeding How should I use this medicine? Take this medicine by mouth with a full glass of water. Follow the directions on the prescription label. Take your medicine at regular intervals. Do not take your medicine more often than directed. Talk to your pediatrician regarding the use of this medicine in children. Special care may be needed. Overdosage: If you think you have taken too much of this medicine contact a poison control center or emergency room at once. NOTE: This medicine is only for you. Do not share this medicine with others. What if I miss a dose? If you miss a dose, take it as soon as you can. If it is almost time for your next dose, take only the next dose. Do not take double or extra doses. What may interact with this medicine? -alcohol or medicines that contain alcohol -cholinesterase inhibitors like neostigmine, ambenonium, and pyridostigmine bromide -other medicines that cause drowsiness  This list may not describe all possible interactions. Give your health care provider a list of all the medicines, herbs, non-prescription drugs, or dietary supplements you use. Also tell them if you smoke, drink alcohol, or use illegal drugs. Some items may interact with your medicine. What should I watch for while using this medicine? You may get drowsy or dizzy. Do not drive, use machinery, or do anything that needs mental alertness until you know how this medicine affects you. Do not stand or sit up quickly, especially  if you are an older patient. This reduces the risk of dizzy or fainting spells. Alcohol may interfere with the effect of this medicine. Avoid alcoholic drinks. What side effects may I notice from receiving this medicine? Side effects that you should report to your doctor or health care professional as soon as possible: -allergic reactions like skin rash, itching or hives, swelling of the face, lips, or tongue -blurred vision or changes in vision -confusion -fainting spells -fever -nausea or vomiting -seizures Side effects that usually do not require medical attention (report to your doctor or health care professional if they continue or are bothersome): -dizziness -drowsiness -headache -metallic taste This list may not describe all possible side effects. Call your doctor for medical advice about side effects. You may report side effects to FDA at 1-800-FDA-1088. Where should I keep my medicine? Keep out of the reach of children. Store at room temperature between 20 and 25 degrees C (68 and 77 degrees F). Keep container tightly closed. Throw away any unused medicine after the expiration date. NOTE: This sheet is a summary. It may not cover all possible information. If you have questions about this medicine, talk to your doctor, pharmacist, or health care provider.  2015, [employer]. (2007-08-24 16:02:03)

## 2014-06-17 NOTE — Discharge Summary (Signed)
Physician Discharge Summary  Patient ID: Greg Hughes MRN: [id number] DOB/AGE: 1941/01/08 74 y.o.  Admit date: 06/16/2014 Discharge date: 06/17/2014  Admission Diagnoses: Cervical spinal stenosis   Discharge Diagnoses: Same   Discharged Condition: good  Hospital Course: The patient was admitted on 06/16/2014 and taken to the operating room where the patient underwent ACDF with plating C5-C6 C6-7. The patient tolerated the procedure well and was taken to the recovery room and then to the floor in stable condition. The hospital course was routine. There were no complications. The wound remained clean dry and intact. Pt had appropriate neck soreness. No complaints of arm pain or new N/T/W. The patient remained afebrile with stable vital signs, and tolerated a regular diet. The patient continued to increase activities, and pain was well controlled with oral pain medications.   Consults: None  Significant Diagnostic Studies:  Results for orders placed or performed during the hospital encounter of 06/16/14  Surgical pcr screen  Result Value Ref Range   MRSA, PCR NEGATIVE NEGATIVE   Staphylococcus aureus NEGATIVE NEGATIVE  Protime-INR  Result Value Ref Range   Prothrombin Time 13.7 11.6 - 15.2 seconds   INR 1.04 0.00 - 1.49  Glucose, capillary  Result Value Ref Range   Glucose-Capillary 173 (H) 70 - 99 mg/dL   Comment 1 Notify RN    Comment 2 Document in Chart     Dg Chest 2 View  06/09/2014   CLINICAL DATA:  Right arm pain and numbness for 2 days, no shortness of Breath  EXAM: CHEST  2 VIEW  COMPARISON:  11/08/2011  FINDINGS: Cardiomediastinal silhouette is stable. No acute infiltrate or pulmonary edema. Stable linear scarring in lingula. No pneumothorax.  IMPRESSION: No active cardiopulmonary disease.   Electronically Signed   By: Lahoma Crocker M.D.   On: 06/09/2014 16:52   Dg Cervical Spine 1 View  06/16/2014   CLINICAL DATA:  ACDF  EXAM: DG C-ARM 61-120 MIN; DG CERVICAL SPINE - 1  VIEW  COMPARISON:  Cervical spine MRI 06/09/2014  FINDINGS: Single lateral intraoperative image shows changes of C5-6 and C6-7 anterior cervical discectomy with ventral plate and screw fixation. Graft markers are noted at the C5-6 level, not visible at the C6-7, possibly due to overlapping shoulders. There are no acute/adverse findings where visualized.  IMPRESSION: C5-6 and C6-7 anterior cervical discectomy with plate and screw fixation.   Electronically Signed   By: Monte Fantasia M.D.   On: 06/16/2014 17:24   Ct Head Wo Contrast  06/09/2014   CLINICAL DATA:  History of spine problems, now with right arm pain.  EXAM: CT HEAD WITHOUT CONTRAST  CT CERVICAL SPINE WITHOUT CONTRAST  TECHNIQUE: Multidetector CT imaging of the head and cervical spine was performed following the standard protocol without intravenous contrast. Multiplanar CT image reconstructions of the cervical spine were also generated.  COMPARISON:  None.  FINDINGS: CT HEAD FINDINGS  There is mild likely age-appropriate atrophy with diffuse sulcal prominence. Scattered minimal periventricular hypodensities compatible microvascular ischemic disease. Given background parenchymal abnormalities, there is no CT evidence of acute large territory infarct. No intraparenchymal or extra-axial mass or hemorrhage. Normal size and configuration the ventricles and basilar cisterns. No midline shift. Intracranial atherosclerosis. Limited visualization of the paranasal sinuses and mastoid air cells is normal. No air-fluid levels. Regional soft tissues appear normal. No displaced calvarial fracture.  CT CERVICAL SPINE FINDINGS  C1 to the superior endplate of T2 is imaged.  The head is held in a minimal degree  of left lateral flexion. Otherwise, no alignment of the cervical spine. No anterolisthesis or retrolisthesis. The dens is normally positioned between the lateral masses of C1. There is moderate degenerative change of the atlantodental articulation. Several  peripherally corticated ossicles are noted about the right-side of the dens (representative sagittal image 21, series 6, coronal image 23, series 5) likely remotely displaced osteophytes. Normal atlantoaxial articulation  The bilateral facets are normally aligned. There is partial ossification of the left C2-C3 transverse facet.  No fracture or static subluxation of the cervical spine. Cervical vertebral body heights are preserved. Prevertebral soft tissues are normal.  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  There is partial ossification of the nuchal ligament posterior to the C5 spinous process. Scattered shotty bilateral cervical lymph nodes are individually not enlarged by size criteria and presumably reactive in etiology. No bulky cervical lymphadenopathy on this noncontrast examination. There is mild heterogeneity of the thyroid gland without discrete nodule on this noncontrast examination. Limited visualization of lung apices demonstrates advanced biapical centrilobular emphysematous change, left greater than right. Exuberant calcifications within the bilateral carotid bulbs.  IMPRESSION: 1. Mild atrophy and microvascular ischemic disease without acute intracranial process. 2. No fracture static subluxation of the cervical spine. 3. 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 4. Exuberant calcifications within the bilateral carotid bulbs. Further evaluation with nonemergent carotid Doppler ultrasound could be performed as clinically indicated.   Electronically Signed   By: Sandi Mariscal M.D.   On: 06/09/2014 17:24   Ct Cervical Spine Wo Contrast  06/09/2014   CLINICAL DATA:  History of spine problems, now with right arm pain.  EXAM: CT HEAD WITHOUT CONTRAST  CT CERVICAL SPINE WITHOUT CONTRAST   TECHNIQUE: Multidetector CT imaging of the head and cervical spine was performed following the standard protocol without intravenous contrast. Multiplanar CT image reconstructions of the cervical spine were also generated.  COMPARISON:  None.  FINDINGS: CT HEAD FINDINGS  There is mild likely age-appropriate atrophy with diffuse sulcal prominence. Scattered minimal periventricular hypodensities compatible microvascular ischemic disease. Given background parenchymal abnormalities, there is no CT evidence of acute large territory infarct. No intraparenchymal or extra-axial mass or hemorrhage. Normal size and configuration the ventricles and basilar cisterns. No midline shift. Intracranial atherosclerosis. Limited visualization of the paranasal sinuses and mastoid air cells is normal. No air-fluid levels. Regional soft tissues appear normal. No displaced calvarial fracture.  CT CERVICAL SPINE FINDINGS  C1 to the superior endplate of T2 is imaged.  The head is held in a minimal degree of left lateral flexion. Otherwise, no alignment of the cervical spine. No anterolisthesis or retrolisthesis. The dens is normally positioned between the lateral masses of C1. There is moderate degenerative change of the atlantodental articulation. Several peripherally corticated ossicles are noted about the right-side of the dens (representative sagittal image 21, series 6, coronal image 23, series 5) likely remotely displaced osteophytes. Normal atlantoaxial articulation  The bilateral facets are normally aligned. There is partial ossification of the left C2-C3 transverse facet.  No fracture or static subluxation of the cervical spine. Cervical vertebral body heights are preserved. Prevertebral soft tissues are normal.  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   There is partial ossification of the nuchal ligament posterior to the C5 spinous process. Scattered shotty bilateral cervical lymph nodes are individually not enlarged by size criteria and presumably reactive in etiology. No bulky cervical lymphadenopathy on this noncontrast examination. There is mild heterogeneity of the thyroid gland without discrete nodule on this noncontrast examination. Limited visualization of lung apices demonstrates advanced biapical centrilobular emphysematous change, left greater than right. Exuberant calcifications within the bilateral carotid bulbs.  IMPRESSION: 1. Mild atrophy and microvascular ischemic disease without acute intracranial process. 2. No fracture static subluxation of the cervical spine. 3. 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 4. Exuberant calcifications within the bilateral carotid bulbs. Further evaluation with nonemergent carotid Doppler ultrasound could be performed as clinically indicated.   Electronically Signed   By: Sandi Mariscal M.D.   On: 06/09/2014 17:24   Mr Brain Wo Contrast  06/09/2014   CLINICAL DATA:  74 year old male with generalized weakness. Initial encounter.  EXAM: MRI HEAD WITHOUT CONTRAST  TECHNIQUE: Multiplanar, multiecho pulse sequences of the brain and surrounding structures were obtained without intravenous contrast.  COMPARISON:  Head CT without contrast 1707 hr today.  FINDINGS: Major intracranial vascular flow voids are preserved. No convincing restricted diffusion to suggest acute infarction. No midline shift, mass effect, evidence of mass lesion, ventriculomegaly, extra-axial collection or acute intracranial hemorrhage. Cervicomedullary junction and pituitary are within normal limits.  Patchy and confluent cerebral white matter T2 and FLAIR hyperintensity. No cortical encephalomalacia. Comparatively mild T2 heterogeneity in the deep gray matter nuclei. Brainstem and cerebellum are within normal limits  for age.  Visible internal auditory structures appear normal. Visualized paranasal sinuses and mastoids are clear. Visualized orbit soft tissues are within normal limits. Visualized scalp soft tissues are within normal limits. Normal bone marrow signal.  IMPRESSION: 1.  No acute intracranial abnormality. 2. Moderate nonspecific cerebral white matter signal changes, most commonly due to chronic small vessel disease.   Electronically Signed   By: Genevie Ann M.D.   On: 06/09/2014 22:06   Mr Cervical Spine Wo Contrast  06/09/2014   CLINICAL DATA:  74 year old male with generalized weakness. Right upper extremity pain. Previous spine surgery. Initial encounter.  EXAM: MRI THORACIC AND CERVICAL SPINE WITHOUT CONTRAST  TECHNIQUE: Multiplanar and multiecho pulse sequences of the thoracic and cervical spine were obtained without intravenous contrast.  COMPARISON:  Cervical spine CT 1707 hr today.  Chest CT 10/26/2013.  FINDINGS: MR CERVICAL SPINE FINDINGS  Straightening of cervical lordosis. Widespread chronic disc and endplate degeneration. No marrow edema or evidence of acute osseous abnormality.  Lingual tonsil hypertrophy appears symmetric. Negative paraspinal soft tissues.  Cervicomedullary junction is within normal limits. No spinal cord signal abnormality identified.  C2-C3: Moderate left facet and mild uncovertebral hypertrophy. Mild to moderate left C3 foraminal stenosis.  C3-C4: Disc space loss with right eccentric disc osteophyte complex. Mild ligament flavum hypertrophy. Mild spinal stenosis without spinal cord mass effect. Right greater than left uncovertebral hypertrophy. Severe right and moderate left C4 foraminal stenosis.  C4-C5: Left eccentric circumferential disc osteophyte complex. Mild facet hypertrophy. Spinal stenosis without spinal cord mass effect. Uncovertebral hypertrophy. Severe left and moderate right C5 foraminal stenosis.  C5-C6: Right eccentric disc osteophyte complex. Superimposed right  lateral recess disc protrusion or osteophyte (series 600, image 26). Mild ligament flavum hypertrophy. Spinal stenosis with mild spinal cord flattening. No cord signal abnormality. Uncovertebral hypertrophy. Severe bilateral C6 foraminal stenosis.  C6-C7: Circumferential disc osteophyte complex.  Mild ligament flavum hypertrophy. Broad-based posterior component of disc. Spinal stenosis with mild spinal cord flattening. No cord signal abnormality. Uncovertebral hypertrophy. Severe bilateral C7 foraminal stenosis.  C7-T1: Mild circumferential disc osteophyte complex. Moderate facet hypertrophy greater on the right. No spinal stenosis. Moderate right C8 foraminal stenosis.  MR THORACIC SPINE FINDINGS  Stable and normal thoracic vertebral height and alignment. No marrow edema or evidence of acute osseous abnormality.  There are small layering bilateral pleural effusions. Otherwise negative visualized thoracic and upper abdominal viscera. Negative visualized posterior paraspinal soft tissues.  There is a degree of thoracic epidural lipomatosis diffusely. This is most pronounced at the T7-T8 level where there is subtotal CSF effacement from the thecal sac. The following superimposed degenerative changes are noted:  T1-T2: Mild to moderate facet hypertrophy greater on the right. Mild right T1 foraminal stenosis.  T2-T3: Negative.  T3-T4: Negative.  T4-T5: Negative.  T5-T6: Negative.  T6-T7: Negative.  T7-T8: Mild facet hypertrophy.  No foraminal stenosis.  T8-T9: Negative.  T9-T10: Mild facet hypertrophy greater on the right. No foraminal stenosis.  T10-T11: Minimal disc bulge. Mild facet hypertrophy. No significant stenosis.  T11-T12: Negative.  T12-L1:  Negative.  No thoracic spinal cord mass effect or signal abnormality.  IMPRESSION: 1. No acute findings in the cervical spine, but widespread degenerative cervical spinal stenosis including mild spinal cord mass effect but no cord signal abnormality. 2. Multifactorial  moderate or severe cervical foraminal stenosis, worst at the right C4, left C5, bilateral C6, and bilateral C7 nerve levels. 3. Comparatively mild thoracic spine degeneration, but there is thoracic epidural lipomatosis which results in up to mild spinal stenosis, most pronounced at T8-T9. No thoracic spinal cord mass effect or signal abnormality. 4. Small layering pleural effusions.   Electronically Signed   By: Genevie Ann M.D.   On: 06/09/2014 22:00   Mr Thoracic Spine Wo Contrast  06/09/2014   CLINICAL DATA:  74 year old male with generalized weakness. Right upper extremity pain. Previous spine surgery. Initial encounter.  EXAM: MRI THORACIC AND CERVICAL SPINE WITHOUT CONTRAST  TECHNIQUE: Multiplanar and multiecho pulse sequences of the thoracic and cervical spine were obtained without intravenous contrast.  COMPARISON:  Cervical spine CT 1707 hr today.  Chest CT 10/26/2013.  FINDINGS: MR CERVICAL SPINE FINDINGS  Straightening of cervical lordosis. Widespread chronic disc and endplate degeneration. No marrow edema or evidence of acute osseous abnormality.  Lingual tonsil hypertrophy appears symmetric. Negative paraspinal soft tissues.  Cervicomedullary junction is within normal limits. No spinal cord signal abnormality identified.  C2-C3: Moderate left facet and mild uncovertebral hypertrophy. Mild to moderate left C3 foraminal stenosis.  C3-C4: Disc space loss with right eccentric disc osteophyte complex. Mild ligament flavum hypertrophy. Mild spinal stenosis without spinal cord mass effect. Right greater than left uncovertebral hypertrophy. Severe right and moderate left C4 foraminal stenosis.  C4-C5: Left eccentric circumferential disc osteophyte complex. Mild facet hypertrophy. Spinal stenosis without spinal cord mass effect. Uncovertebral hypertrophy. Severe left and moderate right C5 foraminal stenosis.  C5-C6: Right eccentric disc osteophyte complex. Superimposed right lateral recess disc protrusion or  osteophyte (series 600, image 26). Mild ligament flavum hypertrophy. Spinal stenosis with mild spinal cord flattening. No cord signal abnormality. Uncovertebral hypertrophy. Severe bilateral C6 foraminal stenosis.  C6-C7: Circumferential disc osteophyte complex. Mild ligament flavum hypertrophy. Broad-based posterior component of disc. Spinal stenosis with mild spinal cord flattening. No cord signal abnormality. Uncovertebral hypertrophy. Severe bilateral C7 foraminal stenosis.  C7-T1: Mild circumferential disc osteophyte complex. Moderate facet hypertrophy greater  on the right. No spinal stenosis. Moderate right C8 foraminal stenosis.  MR THORACIC SPINE FINDINGS  Stable and normal thoracic vertebral height and alignment. No marrow edema or evidence of acute osseous abnormality.  There are small layering bilateral pleural effusions. Otherwise negative visualized thoracic and upper abdominal viscera. Negative visualized posterior paraspinal soft tissues.  There is a degree of thoracic epidural lipomatosis diffusely. This is most pronounced at the T7-T8 level where there is subtotal CSF effacement from the thecal sac. The following superimposed degenerative changes are noted:  T1-T2: Mild to moderate facet hypertrophy greater on the right. Mild right T1 foraminal stenosis.  T2-T3: Negative.  T3-T4: Negative.  T4-T5: Negative.  T5-T6: Negative.  T6-T7: Negative.  T7-T8: Mild facet hypertrophy.  No foraminal stenosis.  T8-T9: Negative.  T9-T10: Mild facet hypertrophy greater on the right. No foraminal stenosis.  T10-T11: Minimal disc bulge. Mild facet hypertrophy. No significant stenosis.  T11-T12: Negative.  T12-L1:  Negative.  No thoracic spinal cord mass effect or signal abnormality.  IMPRESSION: 1. No acute findings in the cervical spine, but widespread degenerative cervical spinal stenosis including mild spinal cord mass effect but no cord signal abnormality. 2. Multifactorial moderate or severe cervical foraminal  stenosis, worst at the right C4, left C5, bilateral C6, and bilateral C7 nerve levels. 3. Comparatively mild thoracic spine degeneration, but there is thoracic epidural lipomatosis which results in up to mild spinal stenosis, most pronounced at T8-T9. No thoracic spinal cord mass effect or signal abnormality. 4. Small layering pleural effusions.   Electronically Signed   By: Genevie Ann M.D.   On: 06/09/2014 22:00   Dg C-arm 1-60 Min  06/16/2014   CLINICAL DATA:  ACDF  EXAM: DG C-ARM 61-120 MIN; DG CERVICAL SPINE - 1 VIEW  COMPARISON:  Cervical spine MRI 06/09/2014  FINDINGS: Single lateral intraoperative image shows changes of C5-6 and C6-7 anterior cervical discectomy with ventral plate and screw fixation. Graft markers are noted at the C5-6 level, not visible at the C6-7, possibly due to overlapping shoulders. There are no acute/adverse findings where visualized.  IMPRESSION: C5-6 and C6-7 anterior cervical discectomy with plate and screw fixation.   Electronically Signed   By: Monte Fantasia M.D.   On: 06/16/2014 17:24    Antibiotics:  Anti-infectives    Start     Dose/Rate Route Frequency Ordered Stop   06/17/14 0600  ceFAZolin (ANCEF) IVPB 2 g/50 mL premix     2 g 100 mL/hr over 30 Minutes Intravenous On call to O.R. 06/16/14 1217 06/16/14 1410   06/16/14 2200  ceFAZolin (ANCEF) IVPB 1 g/50 mL premix     1 g 100 mL/hr over 30 Minutes Intravenous Every 8 hours 06/16/14 1943 06/17/14 0614   06/16/14 1440  bacitracin 50,000 Units in sodium chloride irrigation 0.9 % 500 mL irrigation  Status:  Discontinued       As needed 06/16/14 1441 06/16/14 1626   06/16/14 1226  ceFAZolin (ANCEF) 2-3 GM-% IVPB SOLR    Comments:  Jatavious Peppard, Tomika   : cabinet override      06/16/14 1226 06/17/14 0029      Discharge Exam: Blood pressure 114/54, pulse 58, temperature 97.8 F (36.6 C), temperature source Oral, resp. rate 20, height '5\' 9"'$  (1.753 m), weight 199 lb 8.3 oz (90.5 kg), SpO2 95 %. Neurologic: Grossly  normal, with stable finger drop on the right Incision clean dry and intact  Discharge Medications:     Medication List    STOP  taking these medications        meloxicam 15 MG tablet  Commonly known as:  MOBIC      TAKE these medications        allopurinol 300 MG tablet  Commonly known as:  ZYLOPRIM  TAKE 1 TABLET BY MOUTH EVERY DAY     aspirin 81 MG tablet  Take 81 mg by mouth daily.     colchicine 0.6 MG tablet  Take 1 tablet (0.6 mg total) by mouth 2 (two) times daily as needed.     diltiazem 180 MG 24 hr capsule  Commonly known as:  CARDIZEM CD  TAKE 1 CAPSULE (180 MG TOTAL) BY MOUTH DAILY.     finasteride 5 MG tablet  Commonly known as:  PROSCAR  TAKE 1 TABLET BY MOUTH EVERY DAY     gabapentin 300 MG capsule  Commonly known as:  NEURONTIN  Take 600 mg by mouth 3 (three) times daily.     HYDROcodone-acetaminophen 5-325 MG per tablet  Commonly known as:  NORCO/VICODIN  Take 1-2 tablets by mouth every 6 (six) hours as needed for moderate pain or severe pain.     methocarbamol 500 MG tablet  Commonly known as:  ROBAXIN  Take 1 tablet (500 mg total) by mouth every 8 (eight) hours as needed for muscle spasms.     methylPREDNISolone 4 MG Tbpk tablet  Commonly known as:  MEDROL DOSEPAK  - Day 1: 2 tabs in AM, 1 tab after lunch,1 tab after supper, and 2 tabs at bed   - Day 2: 1 tab in AM, 1 tab after lunch, 1 tab after supper, and 2 tabs at bed  - Day 3: 1 tab in AM, 1 tab after lunch,  1 tab after supper, and 1 tab at bed  - Day 4: 1 tab in AM, 1 tab after lunch, and 1 tab at bed  - Day 5:4 mg (1 tablet) before breakfast and 4 mg (1 tablet) at bedtime  - Day 6: 4 mg on day 6 administered as 4 mg (1 tablet) before breakfast     metoprolol tartrate 25 MG tablet  Commonly known as:  LOPRESSOR  TAKE 1 TABLET (25 MG TOTAL) BY MOUTH 2 (TWO) TIMES DAILY.     simvastatin 10 MG tablet  Commonly known as:  ZOCOR  TAKE 1 TABLET (10 MG TOTAL) BY MOUTH AT BEDTIME.      terazosin 10 MG capsule  Commonly known as:  HYTRIN  TAKE ONE CAPSULE BY MOUTH AT BEDTIME     traMADol 50 MG tablet  Commonly known as:  ULTRAM  Take 1-2 tablets (50-100 mg total) by mouth 2 (two) times daily.        Disposition: Home   Final Dx: ACDF with plating C5-C6 C6-7      Discharge Instructions     Remove dressing in 72 hours    Complete by:  As directed      Call MD for:  difficulty breathing, headache or visual disturbances    Complete by:  As directed      Call MD for:  persistant nausea and vomiting    Complete by:  As directed      Call MD for:  redness, tenderness, or signs of infection (pain, swelling, redness, odor or green/yellow discharge around incision site)    Complete by:  As directed      Call MD for:  severe uncontrolled pain    Complete by:  As  directed      Call MD for:  temperature >100.4    Complete by:  As directed      Diet - low sodium heart healthy    Complete by:  As directed      Discharge instructions    Complete by:  As directed   No strenuous activity, no driving, no heavy lifting     Increase activity slowly    Complete by:  As directed            Follow-up Information    Follow up with Shawnda Mauney S, MD In 2 weeks.   Specialty:  Neurosurgery   Contact information:   [address] [address] [place] [address] [phone]        Signed: Eustace Moore 06/17/2014, 6:44 AM

## 2014-06-17 NOTE — Progress Notes (Signed)
Pt arrived on unit 1930 hrs, A&O, Pain 1/10, no obvious distress, admission orders implemented, Surgical site clean, dry, intact with Honeycomb, minimal swelling/bruising noted around dressing area. Pt oriented to room and equipment. Finger stick glucose 173, on call MD notified, advised to monitor in A.M. No SSI at this time.

## 2014-06-17 NOTE — Progress Notes (Signed)
CARE MANAGEMENT NOTE 06/17/2014  Patient:  Greg Hughes, Greg Hughes   Account Number:  [id number]  Date Initiated:  06/17/2014  Documentation initiated by:  Lorne Skeens  Subjective/Objective Assessment:   Patient was admitted for ACDF. Lives at home with wife.     Action/Plan:   Will follow for discharge needs pending PT/OT evals and physician orders.   Anticipated DC Date:  06/17/2014   Anticipated DC Plan:  HOME/SELF CARE      DC Planning Services  CM consult  OP Neuro Rehab      Choice offered to / List presented to:  C-1 Patient           Status of service:  Completed, signed off Medicare Important Message given?  NA - LOS <3 / Initial given by admissions (If response is "NO", the following Medicare IM given date fields will be blank) Date Medicare IM given:   Medicare IM given by:   Date Additional Medicare IM given:   Additional Medicare IM given by:    Discharge Disposition:  HOME/SELF CARE  Per UR Regulation:  Reviewed for med. necessity/level of care/duration of stay  If discussed at [place] of Stay Meetings, dates discussed:    Comments:  06/17/14 [place], MSN, CM- Spoke with Dr Ronnald Ramp' office regarding OT recommendation for outpatient neuro rehab for splint placement on patient's right upper extremity.  Per Dr Ronnald Ramp, patient should be referred for splinting.  CM provided patient with information, and he is in agreement with plan.  Referral information was faxed to Neuro[facility]hab at patient's request.  Bedside RN was updated.

## 2014-06-17 NOTE — Progress Notes (Signed)
UR complete.  Adi Seales RN, MSN 

## 2014-06-17 NOTE — Progress Notes (Signed)
Patient discharged home with wife. Patient escorted to car by guest services volunteer by wheelchair. RN discussed discharge instructions and medications with patient. RN reviewed constipation prevention with patient as well as signs of infection. Patient denies any pain, neuro assessment intact. Patient's honeycomb dressing is clean, dry, and intact. No signs of infection. Patient states he understands instructions and discharge medications.

## 2014-06-17 NOTE — Evaluation (Signed)
Occupational Therapy Evaluation Patient Details Name: Greg Hughes MRN: [id number] DOB: September 16, 1940 Today's Date: 06/17/2014    History of Present Illness 74 yo male s/p ACDF with new onset of R UE hand changes on 06/09/14   Clinical Impression   Patient is s/p ACDF surgery resulting in functional limitations due to the deficits listed below (see OT problem list). Pt with new onset of R hand digit extension changes on 06/09/14 now s/p ACDF. Pt educated to follow up within one week with outpatient for splinting needs. Pt currently without digit extension on R UE. Patient will benefit from skilled OT acutely to increase independence and safety with ADLS to allow discharge neuro outpatient.     Follow Up Recommendations  Other (comment) (neuro outpatient) SPLINT   Equipment Recommendations  Other (comment)    Recommendations for Other Services Other (comment) (neuro outpatient for splinting needs)     Precautions / Restrictions Precautions Precautions: None      Mobility Bed Mobility Overal bed mobility: Modified Independent                Transfers Overall transfer level: Modified independent                    Balance                                            ADL Overall ADL's : Needs assistance/impaired Eating/Feeding: Independent   Grooming: Wash/dry hands;Oral care;Modified independent Grooming Details (indicate cue type and reason): demonstrates R UE deficits but able to proble solve task                 Toilet Transfer: Modified Independent   Toileting- Clothing Manipulation and Hygiene: Modified independent       Functional mobility during ADLs: Supervision/safety (first time up ) General ADL Comments: Pt demonstrates R UE deficits and recommending outpatient follow up for possible splinting needs     Vision     Perception     Praxis      Pertinent Vitals/Pain Pain Assessment: No/denies pain     Hand  Dominance Right   Extremity/Trunk Assessment Upper Extremity Assessment Upper Extremity Assessment: RUE deficits/detail RUE Deficits / Details: WFL digit flexion, pt unable to perform digit extension, WFL supination/ pronation, wrist extension. pt using wrist extension to (A) with digit extension. Pt with no change in sensation with assessment. Recommend follow up at neuro-outpatient for splint fabrication. pt able to perform fine motor task such as opening tooth paste due to digit flexion, gross grasp WFL. Pt howver has difficulty with palm to digit translation RUE Coordination: decreased gross motor   Lower Extremity Assessment Lower Extremity Assessment: Overall WFL for tasks assessed   Cervical / Trunk Assessment Cervical / Trunk Assessment: Other exceptions (s/p acdf)   Communication Communication Communication: No difficulties   Cognition Arousal/Alertness: Awake/alert Behavior During Therapy: WFL for tasks assessed/performed Overall Cognitive Status: Within Functional Limits for tasks assessed                     General Comments       Exercises       Shoulder Instructions      Home Living Family/patient expects to be discharged to:: Private residence Living Arrangements: Spouse/significant other Available Help at Discharge: Family Type of Home: House  Prior Functioning/Environment Level of Independence: Independent             OT Diagnosis: Generalized weakness   OT Problem List:     OT Treatment/Interventions:      OT Goals(Current goals can be found in the care plan section)    OT Frequency:     Barriers to D/C:            Co-evaluation              End of Session Nurse Communication: Mobility status;Precautions  Activity Tolerance: Patient tolerated treatment well Patient left: in chair;with call bell/phone within reach   Time: 0730-0750 OT Time Calculation (min): 20 min Charges:   OT General Charges $OT Visit: 1 Procedure OT Evaluation $Initial OT Evaluation Tier I: 1 Procedure G-Codes:    Peri Maris 2014/07/16, 8:17 AM Pager: [phone]

## 2014-06-22 ENCOUNTER — Telehealth: Payer: Self-pay

## 2014-06-22 NOTE — Telephone Encounter (Signed)
The patient called and is hoping to get a referral to a doctor to treat his hand.  He states he is experiencing problems with hand pain.

## 2014-06-22 NOTE — Telephone Encounter (Signed)
Needs to be seen

## 2014-06-23 NOTE — Telephone Encounter (Signed)
Pt has apt

## 2014-06-28 ENCOUNTER — Encounter: Payer: Self-pay | Admitting: Occupational Therapy

## 2014-06-28 ENCOUNTER — Ambulatory Visit: Payer: PPO | Attending: Internal Medicine | Admitting: Occupational Therapy

## 2014-06-28 DIAGNOSIS — M6289 Other specified disorders of muscle: Secondary | ICD-10-CM | POA: Diagnosis not present

## 2014-06-28 DIAGNOSIS — R29898 Other symptoms and signs involving the musculoskeletal system: Secondary | ICD-10-CM

## 2014-06-28 DIAGNOSIS — M79601 Pain in right arm: Secondary | ICD-10-CM | POA: Diagnosis not present

## 2014-06-28 DIAGNOSIS — R279 Unspecified lack of coordination: Secondary | ICD-10-CM | POA: Diagnosis not present

## 2014-06-28 NOTE — Therapy (Signed)
[facility] [address] [place], [place], [address] Phone: [phone]   Fax:  [phone]  Occupational Therapy Evaluation  Patient Details  Name: Greg Hughes MRN: [id number] Date of Birth: 1940-04-03 Referring Provider:  Jackolyn Confer, MD  Encounter Date: 06/28/2014      OT End of Session - 06/28/14 0941    Visit Number 1  G1   Number of Visits 17   Date for OT Re-Evaluation 08/27/14   Authorization Type Managed MCR   Authorization Time Period 70 DAYS   OT Start Time 0850   OT Stop Time 0930   OT Time Calculation (min) 40 min   Activity Tolerance Patient tolerated treatment well      Past Medical History  Diagnosis Date  . Hypertension   . GERD (gastroesophageal reflux disease)   . Hyperlipidemia   . History of colon polyps   . BPH (benign prostatic hypertrophy)   . Microscopic hematuria   . ED (erectile dysfunction)   . Tobacco abuse   . A-fib 2013  . Lumbar herniated disc   . Spinal stenosis   . CAD (coronary artery disease)   . Complication of anesthesia     " I fainted a couple of times when they went to get me up."  . Cancer     melanoma of the neck  . Early cataract   . Hernia, abdominal     small    Past Surgical History  Procedure Laterality Date  . Inguinal hernia repair    . Tonsillectomy    . Colonoscopy  2002  . Back surgery    . Tonsillectomy    . Anterior cervical decomp/discectomy fusion N/A 06/16/2014    Procedure: ANTERIOR CERVICAL DECOMPRESSION/DISCECTOMY FUSION CERVICAL FIVE-SIX,CERVICAL SIX-SEVEN;  Surgeon: Eustace Moore, MD;  Location: [facility];  Service: Neurosurgery;  Laterality: N/A;    There were no vitals filed for this visit.  Visit Diagnosis:  Weakness of right hand - Plan: Ot plan of care cert/re-cert  Lack of coordination - Plan: Ot plan of care cert/re-cert  Pain of right arm - Plan: Ot plan of care cert/re-cert      Subjective  Assessment - 06/28/14 0901    Subjective  The pain medication helps my pain.    Patient is accompained by: Family member  wife   Pertinent History s/p ACDF 06/16/14, onset of Rt hand symptoms 06/09/14   Patient Stated Goals To get full motion on my hand   Currently in Pain? Yes  O.T. not directly addressing secondary to being medically managed through pain medication   Pain Score 1    Pain Location Arm   Pain Orientation Right   Pain Descriptors / Indicators Aching   Pain Type Neuropathic pain   Pain Frequency Intermittent   Aggravating Factors  nothing   Pain Relieving Factors pain meds           [facility] OT Assessment - 06/28/14 0001    Assessment   Diagnosis s/p ACDF with plating C5-6 and C6-7   Onset Date --  symptoms began in Rt hand 06/09/14   Precautions   Precautions --  no lifting > 10 lbs for 2 weeks post op (surgery 06/16/14)   Balance Screen   Has the patient fallen in the past 6 months No   Has the patient had a decrease in activity level because of a fear of falling?  No   Is the patient reluctant  to leave their home because of a fear of falling?  No   Home  Environment   Family/patient expects to be discharged to: Private residence   Living Arrangements Spouse/significant other   Type of [place] Two level  but lives on 1st floor   Bathroom Shower/Tub Many;Door  Insurance claims handler - single point   Lives With Spouse  with 3 steps to enter   Prior Function   Level of Independence Independent with basic ADLs  Independent with yardwork and driving   Vocation Retired   ADL   ADL comments eating/grooming with Rt hand with mod difficulty, dressing mod I (only assist for cuff buttons), bathing indepndently. Dependent with yardwork at this time   Mobility   Mobility Status Independent   Written Expression   Dominant Hand Right   Handwriting 90% legible  pt reports back to premorbid status    Vision - History   Baseline Vision Bifocals   Additional Comments no visual deficits   Sensation   Light Touch Appears Intact   Additional Comments intact for localization and 2 pt. discrimination   Coordination   9 Hole Peg Test Right;Left   Right 9 Hole Peg Test 38.97 sec   Left 9 Hole Peg Test 31.03 sec   ROM / Strength   AROM / PROM / Strength AROM;Strength   AROM   Overall AROM Comments BUE AROM WNL's except MP extension Rt hand   Strength   Overall Strength Comments triceps, supinator, and wrist extension all 5/5 MMT RUE   Right Hand AROM   R Index  MCP 0-90 --  -50*   R Long  MCP 0-90 --  -40*   R Ring  MCP 0-90 --  -60*   R Little  MCP 0-90 --  -60*   Hand Function   Right Hand Grip (lbs) 70 LBS   Left Hand Grip (lbs) 115 LBS                           OT Short Term Goals - 06/28/14 0947    OT SHORT TERM GOAL #1   Title Independent w/ splint wear and care (due 07/28/14)   Time 4   Period Weeks   Status New   OT SHORT TERM GOAL #2   Title Independent w/ initial HEP    Time 4   Period Weeks   Status New   OT SHORT TERM GOAL #3   Title Verbalize understanding w/ potential A/E needs to increase ease and safety with ADLS   Time 4   Period Weeks   Status New           OT Long Term Goals - 06/28/14 1191    OT LONG TERM GOAL #1   Title Independent w/ updated HEP (due 08/27/14)   Time 8   Period Weeks   Status New   OT LONG TERM GOAL #2   Title Grip strength Rt hand to increase by 20 lbs or more for return to dominant hand for opening jars/containers   Baseline eval = 70 lbs (Lt = 115 lbs)   Time 8   Period Weeks   Status New   OT LONG TERM GOAL #3   Title Improve coordination as evidenced by reducing speed on 9 hole peg test to 30 sec. or less   Baseline eval =  38.97 sec.    Time 8   Period Weeks   Status New   OT LONG TERM GOAL #4   Title Pt to have MP extension digits 2-5 no more than a 15* lag to release large cylindrical  objects   Baseline Eval MP extension digits 2-5: -50*, -40*, -60*, -60*   Time 8   Period Weeks   Status New               Plan - 07-28-14 0943    Clinical Impression Statement Pt is a 74 y.o. male who presents to outpatient rehab s/p ACDF with plating C5-6, C6-7 on 06/16/14. Pt with onset of symptoms Rt hand (unable to open hand) on 06/09/14. Pt presents to outpatient rehab with decreased Rt hand strength, coordination, and decreased MP extension.    Pt will benefit from skilled therapeutic intervention in order to improve on the following deficits (Retired) Decreased coordination;Decreased safety awareness;Decreased knowledge of use of DME;Impaired UE functional use;Pain;Decreased strength   OT Frequency 2x / week   OT Duration 8 weeks  plus evaluation   OT Treatment/Interventions Self-care/ADL training;Moist Heat;Fluidtherapy;DME and/or AE instruction;Splinting;Patient/family education;Therapeutic exercises;Therapeutic activities;Passive range of motion;Neuromuscular education;Parrafin;Electrical Stimulation;Manual Therapy   Plan fabricate radial n. palsy splint (if still needed), estim to finger extensors as able (check for precautions 1st!)   Consulted and Agree with Plan of Care Patient;Family member/caregiver          G-Codes - 07-28-2014 0955    Functional Assessment Tool Used 9 hole peg test Rt = 38.97 sec., Grip strength Rt = 70 lbs, lacks approx. 50% MP extension   Functional Limitation Carrying, moving and handling objects   Carrying, Moving and Handling Objects Current Status [id number]) At least 20 percent but less than 40 percent impaired, limited or restricted   Carrying, Moving and Handling Objects Goal Status ([id number]) At least 1 percent but less than 20 percent impaired, limited or restricted      Problem List Patient Active Problem List   Diagnosis Date Noted  . S/P cervical spinal fusion 06/16/2014  . Aortic atherosclerosis 05/16/2014  . Quadriceps contusion  11/03/2013  . Abnormal MRI, kidney 11/02/2013  . Contusion of left leg 11/02/2013  . Hematuria 11/02/2013  . Preoperative cardiovascular examination 11/01/2013  . Lung nodules 01/05/2013  . Medicare annual wellness visit, subsequent 09/30/2012  . Gout 09/30/2012  . Screening for colon cancer 06/15/2012  . Hypokalemia 11/27/2011  . Atrial fibrillation 11/18/2011  . GOUT, UNSPECIFIED 04/30/2010  . COPD 04/30/2010  . ERECTILE DYSFUNCTION 10/20/2007  . BENIGN PROSTATIC HYPERTROPHY, WITH URINARY OBSTRUCTION 08/09/2007  . TOBACCO ABUSE 02/10/2007  . MICROSCOPIC HEMATURIA 02/10/2007  . BACK PAIN, LUMBAR, CHRONIC 02/10/2007  . COLONIC POLYPS 02/09/2007  . Hyperlipidemia 02/05/2007  . Essential hypertension 02/05/2007    Carey Bullocks, OTR/L Jul 28, 2014, 10:00 AM  [facility] [address] [place], [place], [address] Phone: [phone]   Fax:  [phone]

## 2014-07-04 ENCOUNTER — Ambulatory Visit (INDEPENDENT_AMBULATORY_CARE_PROVIDER_SITE_OTHER): Payer: PPO | Admitting: Internal Medicine

## 2014-07-04 ENCOUNTER — Encounter: Payer: Self-pay | Admitting: Internal Medicine

## 2014-07-04 VITALS — BP 123/78 | HR 70 | Temp 97.5°F | Ht 69.25 in | Wt 190.0 lb

## 2014-07-04 DIAGNOSIS — I10 Essential (primary) hypertension: Secondary | ICD-10-CM

## 2014-07-04 DIAGNOSIS — M4806 Spinal stenosis, lumbar region: Secondary | ICD-10-CM | POA: Diagnosis not present

## 2014-07-04 DIAGNOSIS — Z981 Arthrodesis status: Secondary | ICD-10-CM

## 2014-07-04 DIAGNOSIS — M4800 Spinal stenosis, site unspecified: Secondary | ICD-10-CM | POA: Insufficient documentation

## 2014-07-04 DIAGNOSIS — M48061 Spinal stenosis, lumbar region without neurogenic claudication: Secondary | ICD-10-CM

## 2014-07-04 NOTE — Progress Notes (Signed)
Pre visit review using our clinic review tool, if applicable. No additional management support is needed unless otherwise documented below in the visit note. 

## 2014-07-04 NOTE — Assessment & Plan Note (Signed)
Chronic low back pain secondary to spinal stenosis. Encouraged follow up with [facility].

## 2014-07-04 NOTE — Progress Notes (Signed)
Dr. Gilford Rile has been removed as PCP.

## 2014-07-04 NOTE — Assessment & Plan Note (Signed)
Neck pain improved after recent discectomy. Continue to follow up with [facility].

## 2014-07-04 NOTE — Assessment & Plan Note (Signed)
BP Readings from Last 3 Encounters:  07/04/14 123/78  06/09/14 154/74  05/16/14 140/70   BP well controlled. Recent renal function was normal. Encouraged him to follow up with new provider.

## 2014-07-04 NOTE — Progress Notes (Signed)
Subjective:    Patient ID: Greg Hughes, male    DOB: 1940-12-28, 74 y.o.   MRN: [id number]  HPI 74YO male presents for follow up.  S/p cervical decompression/disctomy 4/28. Continues to have weakness in right arm.  Continues to have low back pain.Taking Neurontin '600mg'$  tid with some improvement. This medication makes him drowsy. Discussed spinal stimulator with pain management, but plan to hold off for now.  Pt is frustrated by our practice. Angry that he could not be seen same day when having arm pain. Plans to find a new provider.    Past medical, surgical, family and social history per today's encounter.  Review of Systems  Constitutional: Negative for fever, chills, activity change, appetite change, fatigue and unexpected weight change.  Eyes: Negative for visual disturbance.  Respiratory: Negative for cough and shortness of breath.   Cardiovascular: Negative for chest pain, palpitations and leg swelling.  Gastrointestinal: Negative for abdominal pain and abdominal distention.  Genitourinary: Negative for dysuria, urgency and difficulty urinating.  Musculoskeletal: Positive for myalgias, arthralgias, neck pain and neck stiffness. Negative for gait problem.  Skin: Negative for color change and rash.  Hematological: Negative for adenopathy.  Psychiatric/Behavioral: Negative for sleep disturbance and dysphoric mood. The patient is not nervous/anxious.        Objective:    BP 123/78 mmHg  Pulse 70  Temp(Src) 97.5 F (36.4 C) (Oral)  Ht 5' 9.25" (1.759 m)  Wt 190 lb (86.183 kg)  BMI 27.85 kg/m2  SpO2 96% Physical Exam  Constitutional: He is oriented to person, place, and time. He appears well-developed and well-nourished. No distress.  HENT:  Head: Normocephalic and atraumatic.  Right Ear: External ear normal.  Left Ear: External ear normal.  Nose: Nose normal.  Mouth/Throat: Oropharynx is clear and moist. No oropharyngeal exudate.  Eyes: Conjunctivae and EOM  are normal. Pupils are equal, round, and reactive to light. Right eye exhibits no discharge. Left eye exhibits no discharge. No scleral icterus.  Neck: Normal range of motion. Neck supple. No tracheal deviation present. No thyromegaly present.  Cardiovascular: Normal rate, regular rhythm and normal heart sounds.  Exam reveals no gallop and no friction rub.   No murmur heard. Pulmonary/Chest: Effort normal and breath sounds normal. No accessory muscle usage. No tachypnea. No respiratory distress. He has no decreased breath sounds. He has no wheezes. He has no rhonchi. He has no rales. He exhibits no tenderness.  Musculoskeletal: He exhibits no edema.       Cervical back: He exhibits decreased range of motion.       Lumbar back: He exhibits decreased range of motion and pain.  Lymphadenopathy:    He has no cervical adenopathy.  Neurological: He is alert and oriented to person, place, and time. No cranial nerve deficit. Coordination normal.  Skin: Skin is warm and dry. No rash noted. He is not diaphoretic. No erythema. No pallor.  Psychiatric: He has a normal mood and affect. His behavior is normal. Judgment and thought content normal.          Assessment & Plan:   Problem List Items Addressed This Visit      Unprioritized   Essential hypertension - Primary    BP Readings from Last 3 Encounters:  07/04/14 123/78  06/09/14 154/74  05/16/14 140/70   BP well controlled. Recent renal function was normal. Encouraged him to follow up with new provider.      Lumbar canal stenosis    Chronic low back  pain secondary to spinal stenosis. Encouraged follow up with [facility].      S/P cervical spinal fusion    Neck pain improved after recent discectomy. Continue to follow up with [facility].          No Follow-up on file.

## 2014-07-05 ENCOUNTER — Ambulatory Visit: Payer: PPO | Admitting: Occupational Therapy

## 2014-07-05 DIAGNOSIS — M79601 Pain in right arm: Secondary | ICD-10-CM

## 2014-07-05 DIAGNOSIS — M6289 Other specified disorders of muscle: Secondary | ICD-10-CM | POA: Diagnosis not present

## 2014-07-05 DIAGNOSIS — R279 Unspecified lack of coordination: Secondary | ICD-10-CM

## 2014-07-05 DIAGNOSIS — R29898 Other symptoms and signs involving the musculoskeletal system: Secondary | ICD-10-CM

## 2014-07-05 NOTE — Therapy (Signed)
[facility] [address] [place], [place], [address] Phone: [phone]   Fax:  [phone]  Occupational Therapy Treatment  Patient Details  Name: Greg Hughes MRN: [id number] Date of Birth: [birth year] Referring Provider:  Jackolyn Confer, MD  Encounter Date: 07/05/2014      OT End of Session - 07/05/14 1131    Visit Number 2   Number of Visits 17   Date for OT Re-Evaluation 08/27/14   Authorization Type Managed MCR   Authorization Time Period 27 DAYS   OT Start Time 0850   OT Stop Time 0930   OT Time Calculation (min) 40 min   Activity Tolerance Patient tolerated treatment well   Behavior During Therapy [facility] for tasks assessed/performed      Past Medical History  Diagnosis Date  . Hypertension   . GERD (gastroesophageal reflux disease)   . Hyperlipidemia   . History of colon polyps   . BPH (benign prostatic hypertrophy)   . Microscopic hematuria   . ED (erectile dysfunction)   . Tobacco abuse   . A-fib 2013  . Lumbar herniated disc   . Spinal stenosis   . CAD (coronary artery disease)   . Complication of anesthesia     " I fainted a couple of times when they went to get me up."  . Cancer     melanoma of the neck  . Early cataract   . Hernia, abdominal     small    Past Surgical History  Procedure Laterality Date  . Inguinal hernia repair    . Tonsillectomy    . Colonoscopy  2002  . Back surgery    . Tonsillectomy    . Anterior cervical decomp/discectomy fusion N/A 06/16/2014    Procedure: ANTERIOR CERVICAL DECOMPRESSION/DISCECTOMY FUSION CERVICAL FIVE-SIX,CERVICAL SIX-SEVEN;  Surgeon: Eustace Moore, MD;  Location: [place] [facility];  Service: Neurosurgery;  Laterality: N/A;    There were no vitals filed for this visit.  Visit Diagnosis:  Weakness of right hand  Lack of coordination  Pain of right arm      Subjective Assessment - 07/05/14 0855    Pertinent History (p) s/p ACDF  06/16/14, onset of Rt hand symptoms 06/09/14   Patient Stated Goals (p) To get full motion on my hand   Currently in Pain? (p) Yes   Pain Score (p) 1    Pain Location (p) Arm   Pain Orientation (p) Right   Pain Type (p) Neuropathic pain   Pain Frequency (p) Intermittent   Aggravating Factors  (p) intermittant   Pain Relieving Factors (p) pain meds   Multiple Pain Sites (p) No        Treatment: Therapist initiated fabrication/ fitting of a custom radial n.. palsy splint yet did not complete due to time constraints. Plan to complete and issue next visit.                        OT Short Term Goals - 06/28/14 0947    OT SHORT TERM GOAL #1   Title Independent w/ splint wear and care (due 07/28/14)   Time 4   Period Weeks   Status New   OT SHORT TERM GOAL #2   Title Independent w/ initial HEP    Time 4   Period Weeks   Status New   OT SHORT TERM GOAL #3   Title Verbalize understanding w/ potential A/E needs to increase ease  and safety with ADLS   Time 4   Period Weeks   Status New           OT Long Term Goals - 06/28/14 4562    OT LONG TERM GOAL #1   Title Independent w/ updated HEP (due 08/27/14)   Time 8   Period Weeks   Status New   OT LONG TERM GOAL #2   Title Grip strength Rt hand to increase by 20 lbs or more for return to dominant hand for opening jars/containers   Baseline eval = 70 lbs (Lt = 115 lbs)   Time 8   Period Weeks   Status New   OT LONG TERM GOAL #3   Title Improve coordination as evidenced by reducing speed on 9 hole peg test to 30 sec. or less   Baseline eval = 38.97 sec.    Time 8   Period Weeks   Status New   OT LONG TERM GOAL #4   Title Pt to have MP extension digits 2-5 no more than a 15* lag to release large cylindrical objects   Baseline Eval MP extension digits 2-5: -50*, -40*, -60*, -60*   Time 8   Period Weeks   Status New               Plan - 07/05/14 1130    Clinical Impression Statement Pt is  progressing towards goals. Therapist initated fabrication of radial n. plasy splint.   Pt will benefit from skilled therapeutic intervention in order to improve on the following deficits (Retired) Decreased coordination;Decreased safety awareness;Decreased knowledge of use of DME;Impaired UE functional use;Pain;Decreased strength   Rehab Potential Good   OT Frequency 2x / week   OT Duration 8 weeks   OT Treatment/Interventions Self-care/ADL training;Moist Heat;Fluidtherapy;DME and/or AE instruction;Splinting;Patient/family education;Therapeutic exercises;Therapeutic activities;Passive range of motion;Neuromuscular education;Parrafin;Electrical Stimulation;Manual Therapy   Plan complete radial n. palsy splint and issue   Consulted and Agree with Plan of Care Patient        Problem List Patient Active Problem List   Diagnosis Date Noted  . Spinal stenosis 07/04/2014  . S/P cervical spinal fusion 06/16/2014  . Aortic atherosclerosis 05/16/2014  . Lumbar canal stenosis 05/10/2014  . Quadriceps contusion 11/03/2013  . Abnormal MRI, kidney 11/02/2013  . Contusion of left leg 11/02/2013  . Hematuria 11/02/2013  . Preoperative cardiovascular examination 11/01/2013  . Lung nodules 01/05/2013  . Medicare annual wellness visit, subsequent 09/30/2012  . Gout 09/30/2012  . Screening for colon cancer 06/15/2012  . Hypokalemia 11/27/2011  . Atrial fibrillation 11/18/2011  . GOUT, UNSPECIFIED 04/30/2010  . COPD 04/30/2010  . ERECTILE DYSFUNCTION 10/20/2007  . BENIGN PROSTATIC HYPERTROPHY, WITH URINARY OBSTRUCTION 08/09/2007  . TOBACCO ABUSE 02/10/2007  . MICROSCOPIC HEMATURIA 02/10/2007  . BACK PAIN, LUMBAR, CHRONIC 02/10/2007  . COLONIC POLYPS 02/09/2007  . Hyperlipidemia 02/05/2007  . Essential hypertension 02/05/2007    Cuinn Westerhold 07/05/2014, 11:32 AM Theone Murdoch, OTR/L [phone] Phone: [phone] 11:32 AM 07/05/2014 [facility] [address] [place], [place], [address] Phone: [phone]   Fax:  [phone]

## 2014-07-06 ENCOUNTER — Ambulatory Visit: Payer: PPO | Admitting: Occupational Therapy

## 2014-07-06 DIAGNOSIS — M6289 Other specified disorders of muscle: Secondary | ICD-10-CM | POA: Diagnosis not present

## 2014-07-06 DIAGNOSIS — M79601 Pain in right arm: Secondary | ICD-10-CM

## 2014-07-06 DIAGNOSIS — R279 Unspecified lack of coordination: Secondary | ICD-10-CM

## 2014-07-06 DIAGNOSIS — R29898 Other symptoms and signs involving the musculoskeletal system: Secondary | ICD-10-CM

## 2014-07-06 NOTE — Therapy (Signed)
[facility] [address] [place], [place], [address] Phone: [phone]   Fax:  [phone]  Occupational Therapy Treatment  Patient Details  Name: Greg Hughes MRN: [id number] Date of Birth: 17-Feb-1941 Referring Provider:  Jackolyn Confer, MD  Encounter Date: 07/06/2014      OT End of Session - 07/06/14 1010    Visit Number 3   Number of Visits 17   Date for OT Re-Evaluation 08/27/14   Authorization Type Managed MCR   OT Start Time 0935   OT Stop Time 1015   OT Time Calculation (min) 40 min   Activity Tolerance Patient tolerated treatment well   Behavior During Therapy [facility] for tasks assessed/performed      Past Medical History  Diagnosis Date  . Hypertension   . GERD (gastroesophageal reflux disease)   . Hyperlipidemia   . History of colon polyps   . BPH (benign prostatic hypertrophy)   . Microscopic hematuria   . ED (erectile dysfunction)   . Tobacco abuse   . A-fib 2013  . Lumbar herniated disc   . Spinal stenosis   . CAD (coronary artery disease)   . Complication of anesthesia     " I fainted a couple of times when they went to get me up."  . Cancer     melanoma of the neck  . Early cataract   . Hernia, abdominal     small    Past Surgical History  Procedure Laterality Date  . Inguinal hernia repair    . Tonsillectomy    . Colonoscopy  2002  . Back surgery    . Tonsillectomy    . Anterior cervical decomp/discectomy fusion N/A 06/16/2014    Procedure: ANTERIOR CERVICAL DECOMPRESSION/DISCECTOMY FUSION CERVICAL FIVE-SIX,CERVICAL SIX-SEVEN;  Surgeon: Eustace Moore, MD;  Location: [facility];  Service: Neurosurgery;  Laterality: N/A;    There were no vitals filed for this visit.  Visit Diagnosis:  Weakness of right hand  Lack of coordination  Pain of right arm      Subjective Assessment - 07/06/14 2047    Subjective  Denies pain   Patient is accompained by: Family member    Pertinent History s/p ACDF 06/16/14, onset of Rt hand symptoms 06/09/14   Patient Stated Goals To get full motion on my hand   Currently in Pain? No/denies   Multiple Pain Sites No        Treatment: Therapistcompleted fabrication/ fitting of radial n. Palsy splint. Pt was educated in splint wear, care and precautions, he verbalized understanding. Theraputic activities Pt practiced grasp/ release of wooden dowel pegs while wearing splint.  NMES 50 pps 250 pw 10 sec cycle intensity 53 to wrist and finger extensors x 10 mins with pt/ therapist facilitating during on cycle.No adverse reactions.                       OT Education - 07/06/14 1007    Education provided Yes   Education Details splint wear, care and precautions   Person(s) Educated Patient   Methods Explanation;Demonstration;Handout;Verbal cues   Comprehension Verbalized understanding;Returned demonstration          OT Short Term Goals - 06/28/14 0947    OT SHORT TERM GOAL #1   Title Independent w/ splint wear and care (due 07/28/14)   Time 4   Period Weeks   Status New   OT SHORT TERM GOAL #2   Title Independent  w/ initial HEP    Time 4   Period Weeks   Status New   OT SHORT TERM GOAL #3   Title Verbalize understanding w/ potential A/E needs to increase ease and safety with ADLS   Time 4   Period Weeks   Status New           OT Long Term Goals - 06/28/14 6333    OT LONG TERM GOAL #1   Title Independent w/ updated HEP (due 08/27/14)   Time 8   Period Weeks   Status New   OT LONG TERM GOAL #2   Title Grip strength Rt hand to increase by 20 lbs or more for return to dominant hand for opening jars/containers   Baseline eval = 70 lbs (Lt = 115 lbs)   Time 8   Period Weeks   Status New   OT LONG TERM GOAL #3   Title Improve coordination as evidenced by reducing speed on 9 hole peg test to 30 sec. or less   Baseline eval = 38.97 sec.    Time 8   Period Weeks   Status New   OT LONG TERM  GOAL #4   Title Pt to have MP extension digits 2-5 no more than a 15* lag to release large cylindrical objects   Baseline Eval MP extension digits 2-5: -50*, -40*, -60*, -60*   Time 8   Period Weeks   Status New               Plan - 07/06/14 1008    Clinical Impression Statement Pt is progressing towards goals. Radial n.palsy splint was issued and improves pt's RUE functional use.   OT Frequency 2x / week   OT Duration 8 weeks   OT Treatment/Interventions Self-care/ADL training;Moist Heat;Fluidtherapy;DME and/or AE instruction;Splinting;Patient/family education;Therapeutic exercises;Therapeutic activities;Passive range of motion;Neuromuscular education;Parrafin;Electrical Stimulation;Manual Therapy   Plan splint check and modifications PRN, HEP   Consulted and Agree with Plan of Care Patient        Problem List Patient Active Problem List   Diagnosis Date Noted  . Spinal stenosis 07/04/2014  . S/P cervical spinal fusion 06/16/2014  . Aortic atherosclerosis 05/16/2014  . Lumbar canal stenosis 05/10/2014  . Quadriceps contusion 11/03/2013  . Abnormal MRI, kidney 11/02/2013  . Contusion of left leg 11/02/2013  . Hematuria 11/02/2013  . Preoperative cardiovascular examination 11/01/2013  . Lung nodules 01/05/2013  . Medicare annual wellness visit, subsequent 09/30/2012  . Gout 09/30/2012  . Screening for colon cancer 06/15/2012  . Hypokalemia 11/27/2011  . Atrial fibrillation 11/18/2011  . GOUT, UNSPECIFIED 04/30/2010  . COPD 04/30/2010  . ERECTILE DYSFUNCTION 10/20/2007  . BENIGN PROSTATIC HYPERTROPHY, WITH URINARY OBSTRUCTION 08/09/2007  . TOBACCO ABUSE 02/10/2007  . MICROSCOPIC HEMATURIA 02/10/2007  . BACK PAIN, LUMBAR, CHRONIC 02/10/2007  . COLONIC POLYPS 02/09/2007  . Hyperlipidemia 02/05/2007  . Essential hypertension 02/05/2007    Jadarrius Maselli 07/06/2014, 8:49 PM Theone Murdoch, OTR/L [phone] Phone: [phone] 8:49 PM 05/18/2016[facility] [address] [place], [place], [address] Phone: [phone]   Fax:  [phone]

## 2014-07-06 NOTE — Patient Instructions (Signed)
Your Splint This splint should initially be fitted by a healthcare practitioner.  The healthcare practitioner is responsible for providing wearing instructions and precautions to the patient, other healthcare practitioners and care provider involved in the patient's care.  This splint was custom made for you. Please read the following instructions to learn about wearing and caring for your splint.  Precautions Should your splint cause any of the following problems, remove the splint immediately and contact your therapist/physician.  Swelling  Severe Pain  Pressure Areas  Stiffness  Numbness  Do not wear your splint while operating machinery unless it has been fabricated for that purpose.  When To Wear Your Splint Where your splint according to your therapist/physician instructions. Daytime for 1-2 hours, then remove splint and check skin for pressure areas or redness that does not go away after 10 mins If pressure areas or redness noted remove splint, stop wearing and bring to your next appointment.  If no problems you may gradually increase wear time while awake! Do not sleep in splint. Do not try to lift anything heavy with right hand.  Care and Cleaning of Your Splint 1. Keep your splint away from open flames. 2. Your splint will lose its shape in temperatures over 135 degrees Farenheit, ( in car windows, near radiators, ovens or in hot water).  Never make any adjustments to your splint, if the splint needs adjusting remove it and make an appointment to see your therapist. 3. Your splint,may be cleaned with soap and lukewarm water, or alcohol.  Do not immerse in hot water over 135 degrees Farenheit. 4. Straps may be washed with soap and water, but do not moisten the self-adhesive portion.  Theone Murdoch, OTR/L [phone] Phone: [phone] 9:54 AM 07/06/2014

## 2014-07-10 ENCOUNTER — Other Ambulatory Visit: Payer: Self-pay | Admitting: Cardiovascular Disease

## 2014-07-10 ENCOUNTER — Other Ambulatory Visit: Payer: Self-pay | Admitting: Internal Medicine

## 2014-07-11 ENCOUNTER — Encounter: Payer: Self-pay | Admitting: Occupational Therapy

## 2014-07-11 ENCOUNTER — Ambulatory Visit: Payer: PPO | Admitting: Occupational Therapy

## 2014-07-11 DIAGNOSIS — M6289 Other specified disorders of muscle: Secondary | ICD-10-CM | POA: Diagnosis not present

## 2014-07-11 DIAGNOSIS — R29898 Other symptoms and signs involving the musculoskeletal system: Secondary | ICD-10-CM

## 2014-07-11 NOTE — Therapy (Signed)
[facility] [address] [place], [place], [address] Phone: [phone]   Fax:  [phone]  Occupational Therapy Treatment  Patient Details  Name: Greg Hughes MRN: [id number] Date of Birth: 1940/07/23 Referring Provider:  Jackolyn Confer, MD  Encounter Date: 07/11/2014      OT End of Session - 07/11/14 1117    Visit Number 4   Number of Visits 17   Date for OT Re-Evaluation 08/27/14   Authorization Type Managed MCR   Authorization Time Period 65 DAYS   Authorization - Visit Number 4   Authorization - Number of Visits 10   OT Start Time 0935   OT Stop Time 1020   OT Time Calculation (min) 45 min   Activity Tolerance Patient tolerated treatment well      Past Medical History  Diagnosis Date  . Hypertension   . GERD (gastroesophageal reflux disease)   . Hyperlipidemia   . History of colon polyps   . BPH (benign prostatic hypertrophy)   . Microscopic hematuria   . ED (erectile dysfunction)   . Tobacco abuse   . A-fib 2013  . Lumbar herniated disc   . Spinal stenosis   . CAD (coronary artery disease)   . Complication of anesthesia     " I fainted a couple of times when they went to get me up."  . Cancer     melanoma of the neck  . Early cataract   . Hernia, abdominal     small    Past Surgical History  Procedure Laterality Date  . Inguinal hernia repair    . Tonsillectomy    . Colonoscopy  2002  . Back surgery    . Tonsillectomy    . Anterior cervical decomp/discectomy fusion N/A 06/16/2014    Procedure: ANTERIOR CERVICAL DECOMPRESSION/DISCECTOMY FUSION CERVICAL FIVE-SIX,CERVICAL SIX-SEVEN;  Surgeon: Eustace Moore, MD;  Location: [facility];  Service: Neurosurgery;  Laterality: N/A;    There were no vitals filed for this visit.  Visit Diagnosis:  Weakness of right hand      Subjective Assessment - 07/11/14 0937    Subjective  "The splint feels okay except this one place. I got the  nerve conduction study last week"   Pertinent History s/p ACDF 06/16/14, onset of Rt hand symptoms 06/09/14   Patient Stated Goals To get full motion on my hand   Currently in Pain? No/denies                      OT Treatments/Exercises (OP) - 07/11/14 9485    Wrist Exercises   Other wrist exercises wrist extension exercises while attempting to maintain some finger extension (as able) x 10. The further wrist extension, the more the MP's would flex   Hand Exercises   Other Hand Exercises MP extension ex's with wrist neutral. Pt unable to achieve full MP extension, but demo better than last week.    Other Hand Exercises AA/ROM for MP extension with cards (by pushing cards out while keeping wrist immobolized as able)   Modalities   Modalities Electrical Stimulation   Electrical Stimulation   Electrical Stimulation Location wrist/fnger extensors dorsal forearm   Electrical Stimulation Action wrist/finger extension   Electrical Stimulation Parameters 50 pps, 250 pw, 10 on/10 off cycle x 10 min.   Electrical Stimulation Goals Neuromuscular facilitation   Splinting   Splinting slight splinting adjustments made for better comfort at wrist. Pt reports he is  wearing approx. 2 hrs./day. Pt instructed to gradually increase wearing time to 3-4 hrs/day, then continue to gradually increase until wearing during most waking hours. Pt agreed                OT Education - 07/11/14 1016    Education provided Yes   Education Details HEP   Person(s) Educated Patient   Methods Explanation;Demonstration;Handout   Comprehension Verbalized understanding;Returned demonstration          OT Short Term Goals - 06/28/14 0947    OT SHORT TERM GOAL #1   Title Independent w/ splint wear and care (due 07/28/14)   Time 4   Period Weeks   Status New   OT SHORT TERM GOAL #2   Title Independent w/ initial HEP    Time 4   Period Weeks   Status New   OT SHORT TERM GOAL #3   Title Verbalize  understanding w/ potential A/E needs to increase ease and safety with ADLS   Time 4   Period Weeks   Status New           OT Long Term Goals - 06/28/14 4010    OT LONG TERM GOAL #1   Title Independent w/ updated HEP (due 08/27/14)   Time 8   Period Weeks   Status New   OT LONG TERM GOAL #2   Title Grip strength Rt hand to increase by 20 lbs or more for return to dominant hand for opening jars/containers   Baseline eval = 70 lbs (Lt = 115 lbs)   Time 8   Period Weeks   Status New   OT LONG TERM GOAL #3   Title Improve coordination as evidenced by reducing speed on 9 hole peg test to 30 sec. or less   Baseline eval = 38.97 sec.    Time 8   Period Weeks   Status New   OT LONG TERM GOAL #4   Title Pt to have MP extension digits 2-5 no more than a 15* lag to release large cylindrical objects   Baseline Eval MP extension digits 2-5: -50*, -40*, -60*, -60*   Time 8   Period Weeks   Status New               Plan - 07/11/14 1118    Clinical Impression Statement Pt is tolerating splint well with only mild discomfort up to 2 hrs/day. Pt instructed to increase wearing time gradually.    Plan review HEP prn, continue ROM and ex's as able, estim   Consulted and Agree with Plan of Care Patient        Problem List Patient Active Problem List   Diagnosis Date Noted  . Spinal stenosis 07/04/2014  . S/P cervical spinal fusion 06/16/2014  . Aortic atherosclerosis 05/16/2014  . Lumbar canal stenosis 05/10/2014  . Quadriceps contusion 11/03/2013  . Abnormal MRI, kidney 11/02/2013  . Contusion of left leg 11/02/2013  . Hematuria 11/02/2013  . Preoperative cardiovascular examination 11/01/2013  . Lung nodules 01/05/2013  . Medicare annual wellness visit, subsequent 09/30/2012  . Gout 09/30/2012  . Screening for colon cancer 06/15/2012  . Hypokalemia 11/27/2011  . Atrial fibrillation 11/18/2011  . GOUT, UNSPECIFIED 04/30/2010  . COPD 04/30/2010  . ERECTILE DYSFUNCTION  10/20/2007  . BENIGN PROSTATIC HYPERTROPHY, WITH URINARY OBSTRUCTION 08/09/2007  . TOBACCO ABUSE 02/10/2007  . MICROSCOPIC HEMATURIA 02/10/2007  . BACK PAIN, LUMBAR, CHRONIC 02/10/2007  . COLONIC POLYPS 02/09/2007  . Hyperlipidemia 02/05/2007  .  Essential hypertension 02/05/2007    Carey Bullocks, OTR/L 07/11/2014, [facility] [address] [place], [place], [address] Phone: [phone]   Fax:  [phone]

## 2014-07-11 NOTE — Patient Instructions (Signed)
    HAND EXERCISES:   1. Place wrist over pillow, hold forearm down, lift wrist up and try to keep fingers open (do not go on the way up with wrist) x 10 reps, 4-6x/day  2. Place fingers only over pillow with wrist on pillow, hold wrist down, open fingers as much as able (especially at big knuckles) x 10 reps, 4-6x/day  3. Hold wrist still, push cards out in front of you with fingers. Go through the whole deck, 4-6x/day

## 2014-07-13 ENCOUNTER — Ambulatory Visit: Payer: PPO | Admitting: Occupational Therapy

## 2014-07-13 DIAGNOSIS — R279 Unspecified lack of coordination: Secondary | ICD-10-CM

## 2014-07-13 DIAGNOSIS — M79601 Pain in right arm: Secondary | ICD-10-CM

## 2014-07-13 DIAGNOSIS — R29898 Other symptoms and signs involving the musculoskeletal system: Secondary | ICD-10-CM

## 2014-07-13 DIAGNOSIS — M6289 Other specified disorders of muscle: Secondary | ICD-10-CM | POA: Diagnosis not present

## 2014-07-14 ENCOUNTER — Encounter: Payer: PPO | Admitting: Occupational Therapy

## 2014-07-14 NOTE — Therapy (Signed)
[facility] [address] [place], [place], [address] Phone: [phone]   Fax:  [phone]  Occupational Therapy Treatment  Patient Details  Name: Greg Hughes MRN: [id number] Date of Birth: [birth year] Referring Provider:  Jackolyn Confer, MD  Encounter Date: 07/13/2014      OT End of Session - 07/13/14 1042    Visit Number 5   Number of Visits 17   Date for OT Re-Evaluation 08/27/14   Authorization Type Managed MCR   Authorization Time Period 21 DAYS   Authorization - Visit Number 5   Authorization - Number of Visits 10   OT Start Time 1022   OT Stop Time 1100   OT Time Calculation (min) 38 min   Activity Tolerance Patient tolerated treatment well   Behavior During Therapy [facility] for tasks assessed/performed      Past Medical History  Diagnosis Date  . Hypertension   . GERD (gastroesophageal reflux disease)   . Hyperlipidemia   . History of colon polyps   . BPH (benign prostatic hypertrophy)   . Microscopic hematuria   . ED (erectile dysfunction)   . Tobacco abuse   . A-fib 2013  . Lumbar herniated disc   . Spinal stenosis   . CAD (coronary artery disease)   . Complication of anesthesia     " I fainted a couple of times when they went to get me up."  . Cancer     melanoma of the neck  . Early cataract   . Hernia, abdominal     small    Past Surgical History  Procedure Laterality Date  . Inguinal hernia repair    . Tonsillectomy    . Colonoscopy  2002  . Back surgery    . Tonsillectomy    . Anterior cervical decomp/discectomy fusion N/A 06/16/2014    Procedure: ANTERIOR CERVICAL DECOMPRESSION/DISCECTOMY FUSION CERVICAL FIVE-SIX,CERVICAL SIX-SEVEN;  Surgeon: Eustace Moore, MD;  Location: [facility];  Service: Neurosurgery;  Laterality: N/A;    There were no vitals filed for this visit.  Visit Diagnosis:  Weakness of right hand  Lack of coordination  Pain of right arm       Subjective Assessment - 07/13/14 1031    Pertinent History s/p ACDF 06/16/14, onset of Rt hand symptoms 06/09/14   Patient Stated Goals To get full motion on my hand   Currently in Pain? Yes   Pain Score 0-No pain   Pain Location Hand   Pain Orientation Right   Pain Descriptors / Indicators Aching   Pain Type Neuropathic pain   Pain Frequency Intermittent   Aggravating Factors  unknown   Pain Relieving Factors pain meds   Multiple Pain Sites No                      OT Treatments/Exercises (OP) - 07/14/14 0001    Wrist Exercises   Other wrist exercises wrist flexion/ extension, Pt was unable to maintiain finger extension with wrist extension   Hand Exercises   Other Hand Exercises MP extension ex's with wrist neutral. Pt unable to achieve full MP extension,     Other Hand Exercises AA/ROM for MP extension with cards (by pushing cards out while keeping wrist immobolized as able)   Fine Motor Coordination   Other Fine Motor Exercises Functional grasp release of various objects with wrist in neutral position, with emphasis on finger extension with release of items, min v.c.( grasp release  of cones, blocks)    Modalities   Modalities Retail buyer Location wrist/finger extensors dorsal forearm   Electrical Stimulation Action wrist / finger extension   Electrical Stimulation Parameters per previous paramenters, intensity 53   Electrical Stimulation Goals Neuromuscular facilitation   Splinting   Splinting gel pad added over dorsal wrist for pressure relief, mild redness noted. Pt reports iincreasing wear time of splint. Pt reports incr. comfort with gel pad.                  OT Short Term Goals - 06/28/14 0947    OT SHORT TERM GOAL #1   Title Independent w/ splint wear and care (due 07/28/14)   Time 4   Period Weeks   Status New   OT SHORT TERM GOAL #2   Title Independent w/ initial HEP    Time 4    Period Weeks   Status New   OT SHORT TERM GOAL #3   Title Verbalize understanding w/ potential A/E needs to increase ease and safety with ADLS   Time 4   Period Weeks   Status New           OT Long Term Goals - 06/28/14 4650    OT LONG TERM GOAL #1   Title Independent w/ updated HEP (due 08/27/14)   Time 8   Period Weeks   Status New   OT LONG TERM GOAL #2   Title Grip strength Rt hand to increase by 20 lbs or more for return to dominant hand for opening jars/containers   Baseline eval = 70 lbs (Lt = 115 lbs)   Time 8   Period Weeks   Status New   OT LONG TERM GOAL #3   Title Improve coordination as evidenced by reducing speed on 9 hole peg test to 30 sec. or less   Baseline eval = 38.97 sec.    Time 8   Period Weeks   Status New   OT LONG TERM GOAL #4   Title Pt to have MP extension digits 2-5 no more than a 15* lag to release large cylindrical objects   Baseline Eval MP extension digits 2-5: -50*, -40*, -60*, -60*   Time 8   Period Weeks   Status New               Plan - 07/13/14 1036    Clinical Impression Statement Pt is progressing towards goals. He is gradually increasing splint wear time.   Pt will benefit from skilled therapeutic intervention in order to improve on the following deficits (Retired) Decreased coordination;Decreased safety awareness;Decreased knowledge of use of DME;Impaired UE functional use;Pain;Decreased strength   Rehab Potential Good   OT Frequency 2x / week   OT Duration 8 weeks   OT Treatment/Interventions Self-care/ADL training;Moist Heat;Fluidtherapy;DME and/or AE instruction;Splinting;Patient/family education;Therapeutic exercises;Therapeutic activities;Passive range of motion;Neuromuscular education;Parrafin;Electrical Stimulation;Manual Therapy   Plan ROM, estim   Consulted and Agree with Plan of Care Patient        Problem List Patient Active Problem List   Diagnosis Date Noted  . Spinal stenosis 07/04/2014  . S/P  cervical spinal fusion 06/16/2014  . Aortic atherosclerosis 05/16/2014  . Lumbar canal stenosis 05/10/2014  . Quadriceps contusion 11/03/2013  . Abnormal MRI, kidney 11/02/2013  . Contusion of left leg 11/02/2013  . Hematuria 11/02/2013  . Preoperative cardiovascular examination 11/01/2013  . Lung nodules 01/05/2013  . Medicare annual wellness visit, subsequent 09/30/2012  .  Gout 09/30/2012  . Screening for colon cancer 06/15/2012  . Hypokalemia 11/27/2011  . Atrial fibrillation 11/18/2011  . GOUT, UNSPECIFIED 04/30/2010  . COPD 04/30/2010  . ERECTILE DYSFUNCTION 10/20/2007  . BENIGN PROSTATIC HYPERTROPHY, WITH URINARY OBSTRUCTION 08/09/2007  . TOBACCO ABUSE 02/10/2007  . MICROSCOPIC HEMATURIA 02/10/2007  . BACK PAIN, LUMBAR, CHRONIC 02/10/2007  . COLONIC POLYPS 02/09/2007  . Hyperlipidemia 02/05/2007  . Essential hypertension 02/05/2007    RINE,KATHRYN 07/14/2014, 8:33 AM Theone Murdoch, OTR/L [phone] Phone: [phone] 8:33 AM 07/14/2014 [facility] [address] [place], [place], [address] Phone: [phone]   Fax:  [phone]

## 2014-07-19 ENCOUNTER — Ambulatory Visit: Payer: PPO | Admitting: Occupational Therapy

## 2014-07-22 ENCOUNTER — Encounter: Payer: PPO | Admitting: Occupational Therapy

## 2014-07-26 ENCOUNTER — Ambulatory Visit: Payer: PPO | Admitting: Occupational Therapy

## 2014-07-27 ENCOUNTER — Ambulatory Visit: Payer: PPO | Admitting: Occupational Therapy

## 2014-10-13 ENCOUNTER — Other Ambulatory Visit: Payer: Self-pay | Admitting: Thoracic Surgery (Cardiothoracic Vascular Surgery)

## 2014-10-13 DIAGNOSIS — R918 Other nonspecific abnormal finding of lung field: Secondary | ICD-10-CM

## 2014-10-18 ENCOUNTER — Other Ambulatory Visit: Payer: Self-pay | Admitting: Internal Medicine

## 2014-11-08 ENCOUNTER — Ambulatory Visit (INDEPENDENT_AMBULATORY_CARE_PROVIDER_SITE_OTHER): Payer: PPO | Admitting: Thoracic Surgery (Cardiothoracic Vascular Surgery)

## 2014-11-08 ENCOUNTER — Ambulatory Visit
Admission: RE | Admit: 2014-11-08 | Discharge: 2014-11-08 | Disposition: A | Discharge status: Home / Self Care | Payer: PPO | Source: Ambulatory Visit | Attending: Thoracic Surgery (Cardiothoracic Vascular Surgery) | Admitting: Thoracic Surgery (Cardiothoracic Vascular Surgery)

## 2014-11-08 ENCOUNTER — Encounter: Payer: Self-pay | Admitting: Thoracic Surgery (Cardiothoracic Vascular Surgery)

## 2014-11-08 VITALS — BP 130/86 | HR 92 | Resp 20 | Ht 69.25 in | Wt 190.0 lb

## 2014-11-08 DIAGNOSIS — R918 Other nonspecific abnormal finding of lung field: Secondary | ICD-10-CM | POA: Diagnosis not present

## 2014-11-08 NOTE — Progress Notes (Signed)
[address]       [place] [address]             [phone]       HPI:  Greg Hughes returns today for follow-up regarding multiple small lung nodules.  He is a 74 year old man with a history of tobacco abuse. In 2014 he was advised to have a low dose screening CT for lung cancer due to his smoking history. He had a small nodule noted on the low-dose CT on follow-up CT there were multiple nodules. We followed those every 3 months for a while but there was no change. His last scan was about a year ago. He now returns for a final look at those nodules.  An incidental finding on his CT a year ago was a question of a renal mass. An MR of the kidney showed that was likely benign.  Since his last visit he's been having a lot of difficulty with back pain. He had some fusions in his thoracic spine. He also had some cervical spine issues which caused some weakness and muscle loss in his right hand.  He continues to smoke  Past Medical History  Diagnosis Date  . Hypertension   . GERD (gastroesophageal reflux disease)   . Hyperlipidemia   . History of colon polyps   . BPH (benign prostatic hypertrophy)   . Microscopic hematuria   . ED (erectile dysfunction)   . Tobacco abuse   . A-fib 2013  . Lumbar herniated disc   . Spinal stenosis   . CAD (coronary artery disease)   . Complication of anesthesia     " I fainted a couple of times when they went to get me up."  . Cancer     melanoma of the neck  . Early cataract   . Hernia, abdominal     small      Current Outpatient Prescriptions  Medication Sig Dispense Refill  . aspirin 81 MG tablet Take 81 mg by mouth daily.      Marland Kitchen diltiazem (CARDIZEM CD) 180 MG 24 hr capsule TAKE 1 CAPSULE (180 MG TOTAL) BY MOUTH DAILY. 90 capsule 4  . finasteride (PROSCAR) 5 MG tablet TAKE 1 TABLET BY MOUTH EVERY DAY 90 tablet 1  . gabapentin (NEURONTIN) 300 MG capsule Take 600 mg by mouth 3 (three) times daily.     Marland Kitchen  HYDROcodone-acetaminophen (NORCO/VICODIN) 5-325 MG per tablet Take 1-2 tablets by mouth every 6 (six) hours as needed for moderate pain or severe pain. 60 tablet 0  . metoprolol tartrate (LOPRESSOR) 25 MG tablet TAKE 1 TABLET (25 MG TOTAL) BY MOUTH 2 (TWO) TIMES DAILY. 180 tablet 3  . simvastatin (ZOCOR) 10 MG tablet TAKE 1 TABLET (10 MG TOTAL) BY MOUTH AT BEDTIME. 90 tablet 3  . terazosin (HYTRIN) 10 MG capsule TAKE ONE CAPSULE BY MOUTH AT BEDTIME 90 capsule 3   No current facility-administered medications for this visit.    Physical Exam BP 130/86 mmHg  Pulse 92  Resp 20  Ht 5' 9.25" (1.759 m)  Wt 190 lb (86.183 kg)  BMI 27.85 kg/m2  SpO51 41% 74 year old man in no acute distress Depressed affect No cervical or suprapubic or adenopathy Cardiac regular rate and rhythm normal S1 and S2 Lungs clear, equal bilaterally Right hand with thenar wasting  Diagnostic Tests: CT CHEST WITHOUT CONTRAST  TECHNIQUE: Multidetector CT imaging of the chest was performed following the standard protocol without IV contrast.  COMPARISON: Chest CT 09/30/2012; 10/26/2013.  FINDINGS: Mediastinum/Nodes: No enlarged axillary, mediastinal or hilar lymphadenopathy. Normal heart size. No pericardial effusion. Aorta and main pulmonary artery are normal in caliber. Coronary arterial vascular calcifications.  Lungs/Pleura: Central airways are patent. Unchanged 5 mm right upper lobe pulmonary nodule (image 28; series 4). 8 x 5 mm left lower lobe pulmonary nodule (image 43; series 4), grossly stable from prior were it measured 7 x 6 mm. Unchanged ill-defined 2-3 mm nodules within the superior aspect of the right major fissure (image 21). Unchanged 4 mm subpleural nodule within the periphery of the right upper lobe (image 36; series 4). No new or enlarging pulmonary nodules are identified. Scarring and or atelectasis left lower lobe. Mild centrilobular emphysema.  Upper abdomen: Unchanged right  adrenal adenoma. Multiple cysts within the liver.  Musculoskeletal: No aggressive or acute appearing osseous lesions.  IMPRESSION: Unchanged bilateral pulmonary nodules stable dating back to 09/30/2012, most compatible with benign etiology.  Coronary arterial atherosclerotic change.  Mild centrilobular emphysema   Electronically Signed  By: Lovey Newcomer M.D.  On: 11/08/2014 12:09  I personally reviewed the CT chest and concur with the findings as noted above  Impression: Greg Hughes is a 74 year old man with a history of tobacco abuse who had multiple small pulmonary nodules noted on CT scan. These have remained unchanged over the past 2 years. There is no need for further follow-up of those nodules. He is eligible to have one more low dose screening scan in a year.  He continues to smoke. Smoking cessation instruction/counseling given:  counseled patient on the dangers of tobacco use, advised patient to stop smoking, and reviewed strategies to maximize success. He is not interested in pursuing smoking cessation the present time area  Plan: Follow-up with Dr. Raiford Noble be happy to see him back in tablet can be of any further assistance with his care.  Melrose Nakayama, MD [facility] [phone]

## 2015-01-01 ENCOUNTER — Other Ambulatory Visit: Payer: Self-pay | Admitting: Internal Medicine

## 2015-01-01 ENCOUNTER — Encounter: Payer: Self-pay | Admitting: Occupational Therapy

## 2015-01-01 NOTE — Therapy (Signed)
[facility] [address] [place], [place], [address] Phone: [phone]   Fax:  [phone]  Patient Details  Name: Greg Hughes MRN: [id number] Date of Birth: 1940/09/22 Referring Provider:  No ref. provider found  Encounter Date: 01/01/2015  OCCUPATIONAL THERAPY DISCHARGE SUMMARY  Visits from Start of Care: 4  Current functional level related to goals / functional outcomes: Unknown, pt was seen for eval plus 3 visits then did not return.   Remaining deficits: See last progress note.   Education / Equipment: Pt was educated regarding splint wear, care and precautions and HEP. Pt verbalized understanding. Pt cancelled visits as he was going out of town then he did not return. Goals were not fully met as a result.  Plan: Patient agrees to discharge.  Patient goals were not met. Patient is being discharged due to not returning since the last visit.  ?????     Damaree Sargent 01/01/2015, 6:50 PM Theone Murdoch, OTR/L [phone] Phone: [phone] 6:50 PM 01/01/2015 [facility] [address] [place], [place], [address] Phone: [phone]   Fax:  [phone]

## 2015-01-02 NOTE — Telephone Encounter (Signed)
Proscar last refilled 07/11/14 for #90 with 1 refill. Ok to refill this medication?

## 2015-02-21 DIAGNOSIS — I10 Essential (primary) hypertension: Secondary | ICD-10-CM | POA: Diagnosis not present

## 2015-02-21 DIAGNOSIS — M545 Low back pain: Secondary | ICD-10-CM | POA: Diagnosis not present

## 2015-02-21 DIAGNOSIS — Z6828 Body mass index (BMI) 28.0-28.9, adult: Secondary | ICD-10-CM | POA: Diagnosis not present

## 2015-03-02 DIAGNOSIS — G545 Neuralgic amyotrophy: Secondary | ICD-10-CM | POA: Diagnosis not present

## 2015-03-02 DIAGNOSIS — G5611 Other lesions of median nerve, right upper limb: Secondary | ICD-10-CM | POA: Diagnosis not present

## 2015-03-02 DIAGNOSIS — G5621 Lesion of ulnar nerve, right upper limb: Secondary | ICD-10-CM | POA: Diagnosis not present

## 2015-03-02 DIAGNOSIS — M79601 Pain in right arm: Secondary | ICD-10-CM | POA: Diagnosis not present

## 2015-03-09 ENCOUNTER — Ambulatory Visit (INDEPENDENT_AMBULATORY_CARE_PROVIDER_SITE_OTHER): Payer: PPO | Admitting: Cardiovascular Disease

## 2015-03-09 ENCOUNTER — Encounter: Payer: Self-pay | Admitting: Cardiovascular Disease

## 2015-03-09 ENCOUNTER — Other Ambulatory Visit: Payer: Self-pay | Admitting: Neurology

## 2015-03-09 VITALS — BP 114/70 | HR 56 | Ht 69.5 in | Wt 190.5 lb

## 2015-03-09 DIAGNOSIS — I700 Atherosclerosis of aorta: Secondary | ICD-10-CM | POA: Diagnosis not present

## 2015-03-09 DIAGNOSIS — F17200 Nicotine dependence, unspecified, uncomplicated: Secondary | ICD-10-CM

## 2015-03-09 DIAGNOSIS — I4891 Unspecified atrial fibrillation: Secondary | ICD-10-CM

## 2015-03-09 DIAGNOSIS — I10 Essential (primary) hypertension: Secondary | ICD-10-CM | POA: Diagnosis not present

## 2015-03-09 DIAGNOSIS — Z7189 Other specified counseling: Secondary | ICD-10-CM

## 2015-03-09 DIAGNOSIS — M545 Low back pain: Secondary | ICD-10-CM

## 2015-03-09 DIAGNOSIS — E785 Hyperlipidemia, unspecified: Secondary | ICD-10-CM | POA: Diagnosis not present

## 2015-03-09 MED ORDER — METOPROLOL SUCCINATE ER 50 MG PO TB24: 50.0000 mg | ORAL_TABLET | Freq: Every day | ORAL | Status: DC

## 2015-03-09 MED ORDER — METOPROLOL TARTRATE 25 MG PO TABS: 25.0000 mg | ORAL_TABLET | Freq: Two times a day (BID) | ORAL | Status: DC | PRN

## 2015-03-09 MED ORDER — RIVAROXABAN 20 MG PO TABS: 20.0000 mg | ORAL_TABLET | Freq: Every day | ORAL | Status: DC

## 2015-03-09 NOTE — Patient Instructions (Addendum)
You are doing well.  Please start xarelto one a day for blood thinner (to prevent stroke) For any significant bleeding, stop the pill and call me   Please change the metoprolol tartrate to metoprolol succinate once a day at dinner If heart rate runs too slow, cut in 1/2 daily  Stop the aspirin  Please take metoprolol tartrate as needed for breakthrough atrial fibrillation If you start to have more atrial fib, call the office   Please call us if you have new issues that need to be addressed before your next appt.  Your physician wants you to follow-up in: 6 months.  You will receive a reminder letter in the mail two months in advance. If you don't receive a letter, please call our office to schedule the follow-up appointment.

## 2015-03-09 NOTE — Assessment & Plan Note (Signed)
Long discussion concerning his paroxysmal atrial fibrillation  we have completed a CHADS VASC with Him, score 3  long discussion with him concerning risk and benefit of anticoagulation  he is interested in starting anticoagulation  we will start Xarelto  20 mg daily , he will check with his insurance  discussed with him that he should stop the medication for any sign of bleeding

## 2015-03-09 NOTE — Progress Notes (Signed)
Patient ID: Greg Hughes, male    DOB: May 18, 1940, 75 y.o.   MRN: [id number]  HPI Comments: Greg Hughes is a very pleasant 75 year old gentleman with a long history of smoking who continues to smoke one pack Greg day, hypertension, paroxysmal atrial fibrillation, PAD in the aorta, who presents for follow-up of his atrial fibrillation.   In follow-up today, he reports that he continues to have short episodes of paroxysmal atrial fibrillation 2 months ago had several episodes lasting at least 20 minutes each episode They happen sporadically, typically around 10 PM He takes morning metoprolol around 7 AM, evening metoprolol at 9 or 10 PM Denies any significant chest pain on exertion Long discussion concerning his smoking, tried Chantix in the past did not work, does not think he will get less carcinogens in the Ecigarettes Previously had significant back pain, limiting his exercise Chronic problems with fatigue  EKG on today's visit shows normal sinus rhythm with rate 56 bpm, no significant ST or T-wave changes Lab work reviewed with him showing total cholesterol around 150 in 2015, no recent lipid panel available  Other past medical history He presented to the hospital in 2013, [place]  with palpitations and shortness of breath.  found to be in atrial fibrillation with RVR, rate 150 beats Greg minute. He was given IV Cardizem in the emergency room and he converted to normal sinus rhythm during his hospital course. He was discharged on Cardizem 120 mg daily.  Previously reported having rare episodes of atrial fibrillation lasting 20 minutes, possibly 4 episodes over the past year  Echocardiogram 11/08/2011 shows normal systolic function, normal left atrial size, otherwise normal study  Total cholesterol 116, LDL 45, HDL 40   No Known Allergies  Outpatient Encounter Prescriptions as of 03/09/2015  Medication Sig  . diltiazem (CARDIZEM CD) 180 MG 24 hr capsule TAKE 1 CAPSULE (180 MG TOTAL)  BY MOUTH DAILY.  . DULoxetine (CYMBALTA) 30 MG capsule Take 30 mg by mouth daily.   . finasteride (PROSCAR) 5 MG tablet TAKE 1 TABLET BY MOUTH EVERY DAY  . metoprolol tartrate (LOPRESSOR) 25 MG tablet Take 1 tablet (25 mg total) by mouth 2 (two) times daily as needed.  . simvastatin (ZOCOR) 10 MG tablet TAKE 1 TABLET (10 MG TOTAL) BY MOUTH AT BEDTIME.  Marland Kitchen terazosin (HYTRIN) 10 MG capsule TAKE ONE CAPSULE BY MOUTH AT BEDTIME  . traMADol (ULTRAM) 50 MG tablet Take 100 mg by mouth 2 (two) times daily.   . [DISCONTINUED] aspirin 81 MG tablet Take 81 mg by mouth daily.    . [DISCONTINUED] metoprolol tartrate (LOPRESSOR) 25 MG tablet TAKE 1 TABLET (25 MG TOTAL) BY MOUTH 2 (TWO) TIMES DAILY.  . metoprolol succinate (TOPROL-XL) 50 MG 24 hr tablet Take 1 tablet (50 mg total) by mouth daily. Take with or immediately following a meal.  . rivaroxaban (XARELTO) 20 MG TABS tablet Take 1 tablet (20 mg total) by mouth daily with supper.  . [DISCONTINUED] gabapentin (NEURONTIN) 300 MG capsule Take 600 mg by mouth 3 (three) times daily. Reported on 03/09/2015  . [DISCONTINUED] HYDROcodone-acetaminophen (NORCO/VICODIN) 5-325 MG Greg tablet Take 1-2 tablets by mouth every 6 (six) hours as needed for moderate pain or severe pain. (Patient not taking: Reported on 03/09/2015)   No facility-administered encounter medications on file as of 03/09/2015.    Past Medical History  Diagnosis Date  . Hypertension   . GERD (gastroesophageal reflux disease)   . Hyperlipidemia   . History of colon polyps   .  BPH (benign prostatic hypertrophy)   . Microscopic hematuria   . ED (erectile dysfunction)   . Tobacco abuse   . A-fib ([place]) 2013  . Lumbar herniated disc   . Spinal stenosis   . CAD (coronary artery disease)   . Complication of anesthesia     " I fainted a couple of times when they went to get me up."  . Cancer (HCC)     melanoma of the neck  . Early cataract   . Hernia, abdominal     small    Past Surgical  History  Procedure Laterality Date  . Inguinal hernia repair    . Tonsillectomy    . Colonoscopy  2002  . Back surgery    . Tonsillectomy    . Anterior cervical decomp/discectomy fusion N/A 06/16/2014    Procedure: ANTERIOR CERVICAL DECOMPRESSION/DISCECTOMY FUSION CERVICAL FIVE-SIX,CERVICAL SIX-SEVEN;  Surgeon: Eustace Moore, MD;  Location: [facility];  Service: Neurosurgery;  Laterality: N/A;    Social History  reports that he has been smoking Cigarettes.  He has a 60 pack-year smoking history. He has never used smokeless tobacco. He reports that he does not drink alcohol or use illicit drugs.  Family History family history includes Arthritis in his mother; Coronary artery disease in his father; Diabetes in his father; Hiatal hernia in his mother; Stroke in his father. There is no history of Colon cancer.   Review of Systems  Constitutional: Negative.   Respiratory: Negative.   Cardiovascular: Positive for palpitations.  Gastrointestinal: Negative.   Musculoskeletal: Positive for back pain.  Skin: Negative.   Neurological: Negative.   Hematological: Negative.   Psychiatric/Behavioral: Negative.   All other systems reviewed and are negative.   BP 114/70 mmHg  Pulse 56  Ht 5' 9.5" (1.765 m)  Wt 190 lb 8 oz (86.41 kg)  BMI 27.74 kg/m2  Physical Exam  Constitutional: He is oriented to person, place, and time. He appears well-developed and well-nourished.  HENT:  Head: Normocephalic.  Nose: Nose normal.  Mouth/Throat: Oropharynx is clear and moist.  Eyes: Conjunctivae are normal. Pupils are equal, round, and reactive to light.  Neck: Normal range of motion. Neck supple. No JVD present.  Cardiovascular: Normal rate, regular rhythm, S1 normal, S2 normal, normal heart sounds and intact distal pulses.  Exam reveals no gallop and no friction rub.   No murmur heard. Pulmonary/Chest: Effort normal and breath sounds normal. No respiratory distress. He has no decreased breath  sounds. He has no wheezes. He has no rales. He exhibits no tenderness.  Abdominal: Soft. Bowel sounds are normal. He exhibits no distension. There is no tenderness.  Musculoskeletal: Normal range of motion. He exhibits no edema or tenderness.  Lymphadenopathy:    He has no cervical adenopathy.  Neurological: He is alert and oriented to person, place, and time. Coordination normal.  Skin: Skin is warm and dry. No rash noted. No erythema.  Psychiatric: He has a normal mood and affect. His behavior is normal. Judgment and thought content normal.      Assessment and Plan   Nursing note and vitals reviewed.

## 2015-03-09 NOTE — Assessment & Plan Note (Signed)
Aortic atherosclerosis noted on previous ultrasound, x-ray  Smoking cessation recommended

## 2015-03-09 NOTE — Assessment & Plan Note (Signed)
Recommended that he stop metoprolol tartrate , start metoprolol succinate 50 mg at dinnertime

## 2015-03-09 NOTE — Assessment & Plan Note (Signed)
Long discussion with him concerning various types of anticoagulation  He prefers he convenience of a One-A-Day pill such as Xarelto  We have sent in a prescription for 20 mg daily

## 2015-03-09 NOTE — Assessment & Plan Note (Signed)
We have encouraged him to continue to work on weaning his cigarettes and smoking cessation. He will continue to work on this and does not want any assistance with chantix.  

## 2015-03-09 NOTE — Assessment & Plan Note (Signed)
Prior cholesterol at goal.  No changes to his medication

## 2015-03-23 DIAGNOSIS — M79601 Pain in right arm: Secondary | ICD-10-CM | POA: Diagnosis not present

## 2015-03-29 DIAGNOSIS — G5621 Lesion of ulnar nerve, right upper limb: Secondary | ICD-10-CM | POA: Diagnosis not present

## 2015-03-29 DIAGNOSIS — M79601 Pain in right arm: Secondary | ICD-10-CM | POA: Diagnosis not present

## 2015-03-29 DIAGNOSIS — G5631 Lesion of radial nerve, right upper limb: Secondary | ICD-10-CM | POA: Diagnosis not present

## 2015-03-29 DIAGNOSIS — G5611 Other lesions of median nerve, right upper limb: Secondary | ICD-10-CM | POA: Diagnosis not present

## 2015-04-17 ENCOUNTER — Ambulatory Visit (INDEPENDENT_AMBULATORY_CARE_PROVIDER_SITE_OTHER): Payer: PPO | Admitting: Neurology

## 2015-04-17 ENCOUNTER — Encounter: Payer: Self-pay | Admitting: Neurology

## 2015-04-17 VITALS — BP 126/66 | HR 84 | Resp 16 | Ht 69.5 in | Wt 190.0 lb

## 2015-04-17 DIAGNOSIS — R29898 Other symptoms and signs involving the musculoskeletal system: Secondary | ICD-10-CM

## 2015-04-17 DIAGNOSIS — E538 Deficiency of other specified B group vitamins: Secondary | ICD-10-CM

## 2015-04-17 DIAGNOSIS — C801 Malignant (primary) neoplasm, unspecified: Secondary | ICD-10-CM | POA: Insufficient documentation

## 2015-04-17 DIAGNOSIS — I10 Essential (primary) hypertension: Secondary | ICD-10-CM | POA: Insufficient documentation

## 2015-04-17 DIAGNOSIS — N400 Enlarged prostate without lower urinary tract symptoms: Secondary | ICD-10-CM | POA: Insufficient documentation

## 2015-04-17 HISTORY — DX: Other symptoms and signs involving the musculoskeletal system: R29.898

## 2015-04-17 NOTE — Progress Notes (Signed)
Reason for visit: Right arm Weakness  Referring physician: Dr. Hinton Hughes is a 75 y.o. male  History of present illness:  Greg Hughes is a 75 year old right-handed white male with a history of relatively sudden onset of pain and weakness involving the right arm. The patient indicates that around April 19 he awakened with severe pain in the extensor portion of the forearm and hand with inability to fully extend the fingers. The patient denies any neck pain or shoulder discomfort or upper arm pain at that time. 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 Ronnald Ramp and he underwent cervical spine surgery with a 2 level fusion at the C5-7 levels. The patient has not regained all of his strength in the right arm, he believes that it is somewhat easier now to extend the fingers, but he remains significantly weak. The patient has had a significant reduction in the pain in the left arm. The patient has been seen by Dr. Amedeo Plenty for an evaluation. The records provided by Dr.  Amedeo Plenty have been reviewed. 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 The patient has had EMG and nerve conduction study evaluation, the last study according the patient was 6 months ago, I do not have a report of these studies. The patient appeared to have denervation within the posterior interosseous nerve distribution. There is some question whether the patient may have a Parsonage-Turner syndrome. The patient does have history of low back pain, he had lumbosacral spine surgery done 6 months prior to the onset of the right arm weakness. He continues to have back pain and pain down both legs. He does report some balance issues, but with no falls. He has constipation issues but otherwise no problems controlling the bowels or the bladder. He  is on pain medications on a regular basis.  Past Medical History  Diagnosis Date  . Hypertension   . GERD (gastroesophageal reflux disease)   . Hyperlipidemia   . History of colon polyps   . BPH (benign prostatic hypertrophy)   . Microscopic hematuria   . ED (erectile dysfunction)   . Tobacco abuse   . A-fib ([place]) 2013  . Lumbar herniated disc   . Spinal stenosis   . CAD (coronary artery disease)   . Complication of anesthesia     " I fainted a couple of times when they went to get me up."  . Cancer (HCC)     melanoma of the neck  . Early cataract   . Hernia, abdominal     small  . Right arm weakness 04/17/2015    Past Surgical History  Procedure Laterality Date  . Inguinal hernia repair    . Tonsillectomy    . Colonoscopy  2002  . Back surgery    . Tonsillectomy    . Anterior cervical decomp/discectomy fusion N/A 06/16/2014    Procedure: ANTERIOR CERVICAL DECOMPRESSION/DISCECTOMY FUSION CERVICAL FIVE-SIX,CERVICAL SIX-SEVEN;  Surgeon: Eustace Moore, MD;  Location: [facility];  Service: Neurosurgery;  Laterality: N/A;    Family History  Problem Relation Age of Onset  . Diabetes Father   . Coronary artery disease Father   . Stroke Father   . Arthritis Mother   . Hiatal hernia Mother   . Colon cancer Neg Hx     Social history:  reports that he has been smoking Cigarettes.  He has a 60 pack-year smoking history. He has never used smokeless tobacco. He reports that he does not drink alcohol or use illicit drugs.  Medications:  Prior to Admission medications   Medication Sig Start Date End Date Taking? Authorizing Provider  diltiazem (CARDIZEM CD) 180 MG 24 hr capsule TAKE 1 CAPSULE (180 MG TOTAL) BY MOUTH DAILY. 06/07/14  Yes Minna Merritts, MD  DULoxetine (CYMBALTA) 30 MG capsule Take 30 mg by mouth daily.  03/04/15  Yes Historical Provider, MD  finasteride (PROSCAR) 5 MG tablet TAKE 1 TABLET BY MOUTH EVERY DAY 01/02/15  Yes Jackolyn Confer, MD  metoprolol  succinate (TOPROL-XL) 50 MG 24 hr tablet Take 1 tablet (50 mg total) by mouth daily. Take with or immediately following a meal. 03/09/15  Yes Minna Merritts, MD  metoprolol tartrate (LOPRESSOR) 25 MG tablet Take 1 tablet (25 mg total) by mouth 2 (two) times daily as needed. 03/09/15  Yes Minna Merritts, MD  rivaroxaban (XARELTO) 20 MG TABS tablet Take 1 tablet (20 mg total) by mouth daily with supper. 03/09/15  Yes Minna Merritts, MD  simvastatin (ZOCOR) 10 MG tablet TAKE 1 TABLET (10 MG TOTAL) BY MOUTH AT BEDTIME. 03/01/13  Yes Minna Merritts, MD  terazosin (HYTRIN) 10 MG capsule TAKE ONE CAPSULE BY MOUTH AT BEDTIME 10/18/14  Yes Jackolyn Confer, MD  traMADol (ULTRAM) 50 MG tablet Take 100 mg by mouth 2 (two) times daily.  02/09/15  Yes Historical Provider, MD     No Known Allergies  ROS:  Out of a complete 14 system review of symptoms, the patient complains only of the following symptoms, and all other reviewed systems are negative.  Blurred vision Blood in the urine, impotence Constipation Weakness  Blood pressure 126/66, pulse 84, resp. rate 16, height 5' 9.5" (1.765 m), weight 190 lb (86.183 kg).  Physical Exam  General: The patient is alert and cooperative at the time of the examination.  Eyes: Pupils are equal, round, and reactive to light. Discs are flat bilaterally.  Neck: The neck is supple, no carotid bruits are noted.  Respiratory: The respiratory examination is clear.  Cardiovascular: The cardiovascular examination reveals a regular rate and rhythm, no obvious murmurs or rubs are noted.  Skin: Extremities are without significant edema.  Neurologic Exam  Mental status: The patient is alert and oriented x 3 at the time of the examination. The patient has apparent normal recent and remote memory, with an apparently normal attention span and concentration ability.  Cranial nerves: Facial symmetry is present. There is good sensation of the face to pinprick and soft  touch bilaterally. The strength of the facial muscles and the muscles to head turning and shoulder shrug are normal bilaterally. Speech is well enunciated, no aphasia or dysarthria is noted. Extraocular movements are full. Visual fields are full. The tongue is midline, and the patient has symmetric elevation of the soft palate. No obvious hearing deficits are noted.  Motor: The motor testing reveals 5 over 5 strength of all 4 extremities, with exception that there is some weakness with extension of all the fingers and thumb on the right, wrist extension is of good strength, brachioradialis and triceps muscles on the right are normal strength. Good symmetric motor tone is noted throughout.  Sensory: Sensory testing is intact to pinprick, soft touch, vibration sensation, and position sense on all 4 extremities, with exception of a stocking pattern pinprick sensory  deficit up the legs to the knees bilaterally. No evidence of extinction is noted.  Coordination: Cerebellar testing reveals good finger-nose-finger and heel-to-shin bilaterally.  Gait and station: Gait is normal. Tandem gait is slightly unsteady. Romberg is negative. No drift is seen.  Reflexes: Deep tendon reflexes are symmetric, but are depressed bilaterally. Toes are downgoing bilaterally.   MRI cervical and thoracic spine 06/09/14:  IMPRESSION: 1. No acute findings in the cervical spine, but widespread degenerative cervical spinal stenosis including mild spinal cord mass effect but no cord signal abnormality. 2. Multifactorial moderate or severe cervical foraminal stenosis, worst at the right C4, left C5, bilateral C6, and bilateral C7 nerve levels. 3. Comparatively mild thoracic spine degeneration, but there is thoracic epidural lipomatosis which results in up to mild spinal stenosis, most pronounced at T8-T9. No thoracic spinal cord mass effect or signal abnormality. 4. Small layering pleural effusions.  * MRI scan images  were reviewed online. I agree with the written report.    Assessment/Plan:  1. Right arm weakness  2. Chronic low back pain  Clinically, the patient appears to have weakness in the right posterior interosseous nerve distribution. The pain and discomfort that the patient described was solely in this distribution, the patient never had neck pain or shoulder discomfort. EMG evaluations have shown denervation in the extensors of the forearm. The last study according the patient was 6 months ago, I will repeat this study. The patient will have blood work done to look for various etiologies of mononeuritis multiplex or for peripheral neuropathies. Parsonage-Turner syndrome may present with weakness primarily in the posterior interosseous distribution, but involvement outside of this nerve distribution should be seen to help confirm the diagnosis. The patient has had MRI evaluations of the brain, cervical spine, and thoracic spine. MRI of the brain showed chronic moderate level small vessel disease. The patient will follow-up for the EMG evaluation.  Jill Alexanders MD 04/17/2015 7:30 PM  [facility] [address] [place], [place] [address]  Phone [phone] Fax [phone]

## 2015-04-18 LAB — VITAMIN B12: VITAMIN B 12: 759 pg/mL (ref 211–946)

## 2015-04-18 LAB — SEDIMENTATION RATE: Sed Rate: 20 mm/hr (ref 0–30)

## 2015-04-18 LAB — ANA W/REFLEX: ANA: NEGATIVE

## 2015-04-18 LAB — MULTIPLE MYELOMA PANEL, SERUM
ALBUMIN/GLOB SERPL: 1.3 (ref 0.7–1.7)
Albumin SerPl Elph-Mcnc: 3.9 g/dL (ref 2.9–4.4)
Alpha 1: 0.2 g/dL (ref 0.0–0.4)
Alpha2 Glob SerPl Elph-Mcnc: 0.8 g/dL (ref 0.4–1.0)
B-GLOBULIN SERPL ELPH-MCNC: 1.4 g/dL — AB (ref 0.7–1.3)
GAMMA GLOB SERPL ELPH-MCNC: 0.7 g/dL (ref 0.4–1.8)
GLOBULIN, TOTAL: 3.1 g/dL (ref 2.2–3.9)
IgA/Immunoglobulin A, Serum: 547 mg/dL — ABNORMAL HIGH (ref 61–437)
IgG (Immunoglobin G), Serum: 910 mg/dL (ref 700–1600)
IgM (Immunoglobulin M), Srm: 71 mg/dL (ref 15–143)
Total Protein: 7 g/dL (ref 6.0–8.5)

## 2015-04-18 LAB — RHEUMATOID FACTOR: RHEUMATOID FACTOR: 26 [IU]/mL — AB (ref 0.0–13.9)

## 2015-04-18 LAB — B. BURGDORFI ANTIBODIES

## 2015-04-18 LAB — ANGIOTENSIN CONVERTING ENZYME: Angio Convert Enzyme: 36 U/L (ref 14–82)

## 2015-04-19 ENCOUNTER — Telehealth: Payer: Self-pay | Admitting: Neurology

## 2015-04-19 LAB — PAN-ANCA
Atypical pANCA: <1:20 {titer}
P-ANCA: <1:20 {titer}

## 2015-04-19 NOTE — Telephone Encounter (Signed)
I called the patient. The blood work is relatively unremarkable with exception of a modest elevation in the rheumatoid factor. This can be followed over time, not clear that this is clinically significant.

## 2015-05-09 ENCOUNTER — Encounter: Payer: Self-pay | Admitting: Neurology

## 2015-05-09 ENCOUNTER — Ambulatory Visit (INDEPENDENT_AMBULATORY_CARE_PROVIDER_SITE_OTHER): Payer: PPO | Admitting: Neurology

## 2015-05-09 ENCOUNTER — Ambulatory Visit (INDEPENDENT_AMBULATORY_CARE_PROVIDER_SITE_OTHER): Payer: Self-pay | Admitting: Neurology

## 2015-05-09 DIAGNOSIS — M4802 Spinal stenosis, cervical region: Secondary | ICD-10-CM

## 2015-05-09 DIAGNOSIS — R29898 Other symptoms and signs involving the musculoskeletal system: Secondary | ICD-10-CM

## 2015-05-09 NOTE — Progress Notes (Signed)
Please refer to EMG and nerve conduction study procedure note. 

## 2015-05-09 NOTE — Procedures (Signed)
     HISTORY:  Greg Hughes is a 75 year old gentleman with onset of right arm pain and weakness that began in April 2016. The patient was found to have multilevel disease in the neck with severe bilateral neuroforaminal stenosis affecting the C6 and C7 nerve roots bilaterally, some spinal stenosis at these levels as well. The patient has had persistent weakness of the right arm and hand following surgery. He is being reevaluated for this issue.  NERVE CONDUCTION STUDIES:  Nerve conduction studies were performed on both upper extremities. The distal motor latencies and motor amplitudes for the median and ulnar nerves were within normal limits. The F wave latencies and nerve conduction velocities for these nerves were also normal. The sensory latencies for the median and ulnar nerves were normal. The radial nerves were also tested, revealing a prolonged distal motor latency on the right, normal on the left, with a low motor amplitudes on the right, borderline normal on the left. The nerve conduction velocities for these nerves were normal bilaterally. The sensory latencies for the radial nerves were normal bilaterally.  Nerve conduction studies were performed on the left lower extremity. The distal motor latencies and motor amplitudes for the peroneal and posterior tibial nerves were within normal limits. The nerve conduction velocities for these nerves were also normal. The H reflex latency was normal. The sensory latency for the peroneal nerve was within normal limits.   EMG STUDIES:  EMG study was performed on the right upper extremity:  The first dorsal interosseous muscle reveals 2 to 5 K units with decreased recruitment. 1+ fibrillations and positive waves were noted. The abductor pollicis brevis muscle reveals 2 to 4 K units with slightly reduced recruitment. No fibrillations or positive waves were noted. The extensor indicis proprius muscle reveals 1 to 3 K units with decreased  recruitment. 2+ fibrillations and positive waves were noted. The extensor digitorum communis muscle reveals 1 to 3 K units with decreased recruitment. No fibrillations or positive waves were seen. The pronator teres muscle reveals 2 to 3 K units with full recruitment. No fibrillations or positive waves were noted. The biceps muscle reveals 1 to 2 K units with full recruitment. No fibrillations or positive waves were noted. The triceps muscle reveals 2 to 4 K units with full recruitment. No fibrillations or positive waves were noted. The anterior deltoid muscle reveals 2 to 3 K units with full recruitment. No fibrillations or positive waves were noted. The cervical paraspinal muscles were tested at 2 levels. No abnormalities of insertional activity were seen at either level tested. There was fair relaxation.   IMPRESSION:  Nerve conduction studies done on both upper extremities and on the left lower extremity shows no evidence of a generalized peripheral neuropathy. There does appear to be evidence of abnormal right radial nerve function, EMG evaluation of the right arm shows primarily right posterior interosseous innervated muscle involvement, with evidence of ulnar nerve involvement as well distally. 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 I suppose that a Parsonage-Turner syndrome does need to be considered, cannot be completely excluded by this study, but the sensory sparing seen is more consistent with a cervical radiculopathy. There appears to be some evidence of healing involving the right posterior interosseous nerve.  Greg Alexanders MD 05/09/2015 10:30 AM  [facility] [address] [place], [place] [address]  Phone [phone] Fax [phone]

## 2015-05-10 ENCOUNTER — Encounter: Payer: PPO | Admitting: Neurology

## 2015-05-17 DIAGNOSIS — M542 Cervicalgia: Secondary | ICD-10-CM | POA: Diagnosis not present

## 2015-05-17 DIAGNOSIS — M109 Gout, unspecified: Secondary | ICD-10-CM | POA: Diagnosis not present

## 2015-05-17 DIAGNOSIS — Z1389 Encounter for screening for other disorder: Secondary | ICD-10-CM | POA: Diagnosis not present

## 2015-05-17 DIAGNOSIS — Z23 Encounter for immunization: Secondary | ICD-10-CM | POA: Diagnosis not present

## 2015-05-17 DIAGNOSIS — Z0000 Encounter for general adult medical examination without abnormal findings: Secondary | ICD-10-CM | POA: Diagnosis not present

## 2015-05-17 DIAGNOSIS — M545 Low back pain: Secondary | ICD-10-CM | POA: Diagnosis not present

## 2015-05-17 DIAGNOSIS — Z85820 Personal history of malignant melanoma of skin: Secondary | ICD-10-CM | POA: Diagnosis not present

## 2015-05-17 DIAGNOSIS — E7800 Pure hypercholesterolemia, unspecified: Secondary | ICD-10-CM | POA: Diagnosis not present

## 2015-05-17 DIAGNOSIS — I10 Essential (primary) hypertension: Secondary | ICD-10-CM | POA: Diagnosis not present

## 2015-05-17 DIAGNOSIS — N400 Enlarged prostate without lower urinary tract symptoms: Secondary | ICD-10-CM | POA: Diagnosis not present

## 2015-05-30 ENCOUNTER — Encounter: Payer: Self-pay | Admitting: Gastroenterology

## 2015-06-07 DIAGNOSIS — Z85820 Personal history of malignant melanoma of skin: Secondary | ICD-10-CM | POA: Diagnosis not present

## 2015-06-07 DIAGNOSIS — L821 Other seborrheic keratosis: Secondary | ICD-10-CM | POA: Diagnosis not present

## 2015-06-07 DIAGNOSIS — L919 Hypertrophic disorder of the skin, unspecified: Secondary | ICD-10-CM | POA: Diagnosis not present

## 2015-06-07 DIAGNOSIS — D235 Other benign neoplasm of skin of trunk: Secondary | ICD-10-CM | POA: Diagnosis not present

## 2015-06-07 DIAGNOSIS — D1801 Hemangioma of skin and subcutaneous tissue: Secondary | ICD-10-CM | POA: Diagnosis not present

## 2015-06-07 DIAGNOSIS — L905 Scar conditions and fibrosis of skin: Secondary | ICD-10-CM | POA: Diagnosis not present

## 2015-06-17 ENCOUNTER — Other Ambulatory Visit: Payer: Self-pay | Admitting: Cardiovascular Disease

## 2015-06-19 DIAGNOSIS — G8929 Other chronic pain: Secondary | ICD-10-CM | POA: Diagnosis not present

## 2015-06-19 DIAGNOSIS — M545 Low back pain: Secondary | ICD-10-CM | POA: Diagnosis not present

## 2015-06-19 DIAGNOSIS — M4802 Spinal stenosis, cervical region: Secondary | ICD-10-CM | POA: Diagnosis not present

## 2015-06-19 DIAGNOSIS — Z6828 Body mass index (BMI) 28.0-28.9, adult: Secondary | ICD-10-CM | POA: Diagnosis not present

## 2015-08-18 ENCOUNTER — Other Ambulatory Visit: Payer: Self-pay

## 2015-08-18 MED ORDER — DILTIAZEM HCL ER COATED BEADS 180 MG PO CP24: ORAL_CAPSULE | ORAL | Status: DC

## 2015-08-18 NOTE — Telephone Encounter (Signed)
Refill sent for diltiazem

## 2015-09-06 ENCOUNTER — Ambulatory Visit: Payer: PPO | Admitting: Cardiovascular Disease

## 2015-09-08 ENCOUNTER — Encounter: Payer: Self-pay | Admitting: Cardiovascular Disease

## 2015-09-08 ENCOUNTER — Ambulatory Visit (INDEPENDENT_AMBULATORY_CARE_PROVIDER_SITE_OTHER): Payer: PPO | Admitting: Cardiovascular Disease

## 2015-09-08 VITALS — BP 114/64 | HR 65 | Ht 69.5 in | Wt 191.5 lb

## 2015-09-08 DIAGNOSIS — I4891 Unspecified atrial fibrillation: Secondary | ICD-10-CM

## 2015-09-08 DIAGNOSIS — I700 Atherosclerosis of aorta: Secondary | ICD-10-CM | POA: Diagnosis not present

## 2015-09-08 DIAGNOSIS — I10 Essential (primary) hypertension: Secondary | ICD-10-CM | POA: Diagnosis not present

## 2015-09-08 DIAGNOSIS — E785 Hyperlipidemia, unspecified: Secondary | ICD-10-CM

## 2015-09-08 DIAGNOSIS — F17200 Nicotine dependence, unspecified, uncomplicated: Secondary | ICD-10-CM

## 2015-09-08 NOTE — Progress Notes (Signed)
Patient ID: ZIA KANNER, male   DOB: 1940/12/20, 75 y.o.   MRN: [id number] Cardiology Office Note  Date:  09/08/2015   ID:  Greg Hughes, DOB March 17, 1940, MRN [id number]  PCP:  Kandice Hams, MD   Chief Complaint  Patient presents with  . other    6 month f/u. Meds reviewed verbally with pt.    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   On his last clinic visit we added metoprolol succinate, anticoagulation with xarelto In follow-up today,He denies any significant episodes of palpitations concerning for atrial fibrillation Continues on diltiazem 180 mg daily Reports that he feels well with no complaints No excessive bruising or bleeding, no hematuria or GI bleeding  Prior to starting metoprolol he did report atrial fibrillation in the evenings Denies any significant chest pain on exertion Previously discussed his smoking, tried Chantix in the past did not work Chronic problems with fatigue  Total cholesterol 143, LDL 65  EKG on today's visit shows normal sinus rhythm with rate 65 bpm, no significant ST or T-wave changes  Other past medical history He presented to the hospital in 2013, [place] with palpitations and shortness of breath. found to be in atrial fibrillation with RVR, rate 150 beats per minute. He was given IV Cardizem in the emergency room and he converted to normal sinus rhythm during his hospital course. He was discharged on Cardizem 120 mg daily.  Previously reported having rare episodes of atrial fibrillation lasting 20 minutes, possibly 4 episodes over the past year  Echocardiogram 11/08/2011 shows normal systolic function, normal left atrial size, otherwise normal study  PMH:   has a past medical history of Hypertension; GERD (gastroesophageal  reflux disease); Hyperlipidemia; History of colon polyps; BPH (benign prostatic hypertrophy); Microscopic hematuria; ED (erectile dysfunction); Tobacco abuse; A-fib ([place]) (2013); Lumbar herniated disc; Spinal stenosis; CAD (coronary artery disease); Complication of anesthesia; Cancer ([place]); Early cataract; Hernia, abdominal; and Right arm weakness (04/17/2015).  PSH:    Past Surgical History  Procedure Laterality Date  . Inguinal hernia repair    . Tonsillectomy    . Colonoscopy  2002  . Back surgery    . Tonsillectomy    . Anterior cervical decomp/discectomy fusion N/A 06/16/2014    Procedure: ANTERIOR CERVICAL DECOMPRESSION/DISCECTOMY FUSION CERVICAL FIVE-SIX,CERVICAL SIX-SEVEN;  Surgeon: Eustace Moore, MD;  Location: [facility];  Service: Neurosurgery;  Laterality: N/A;    Current Outpatient Prescriptions  Medication Sig Dispense Refill  . diltiazem (CARDIZEM CD) 180 MG 24 hr capsule TAKE 1 CAPSULE (180 MG TOTAL) BY MOUTH DAILY. 90 capsule 4  . DULoxetine (CYMBALTA) 30 MG capsule Take 30 mg by mouth daily.     . finasteride (PROSCAR) 5 MG tablet TAKE 1 TABLET BY MOUTH EVERY DAY 90 tablet 1  . metoprolol succinate (TOPROL-XL) 50 MG 24 hr tablet Take 1 tablet (50 mg total) by mouth daily. Take with or immediately following a meal. 90 tablet 4  . rivaroxaban (XARELTO) 20 MG TABS tablet Take 1 tablet (20 mg total) by mouth daily with supper. 30 tablet 11  . simvastatin (ZOCOR) 10 MG tablet TAKE 1 TABLET (10 MG TOTAL) BY MOUTH AT BEDTIME. 90 tablet 3  . terazosin (HYTRIN) 10 MG capsule TAKE ONE CAPSULE BY MOUTH AT BEDTIME 90 capsule 3  .  traMADol (ULTRAM) 50 MG tablet Take 100 mg by mouth 2 (two) times daily as needed.      No current facility-administered medications for this visit.     Allergies:   Review of patient's allergies indicates no known allergies.   Social History:  The patient  reports that he has been smoking Cigarettes.  He has a 60 pack-year smoking history. He has never  used smokeless tobacco. He reports that he does not drink alcohol or use illicit drugs.   Family History:   family history includes Arthritis in his mother; Coronary artery disease in his father; Diabetes in his father; Hiatal hernia in his mother; Stroke in his father. There is no history of Colon cancer.    Review of Systems: Review of Systems  Constitutional: Negative.   Respiratory: Negative.   Cardiovascular: Negative.   Gastrointestinal: Negative.   Musculoskeletal: Negative.   Neurological: Negative.   Psychiatric/Behavioral: Negative.   All other systems reviewed and are negative.    PHYSICAL EXAM: VS:  BP 114/64 mmHg  Pulse 65  Ht 5' 9.5" (1.765 m)  Wt 191 lb 8 oz (86.864 kg)  BMI 27.88 kg/m2 , BMI Body mass index is 27.88 kg/(m^2). GEN: Well nourished, well developed, in no acute distress HEENT: normal Neck: no JVD, carotid bruits, or masses Cardiac: RRR; no murmurs, rubs, or gallops,no edema  Respiratory:  clear to auscultation bilaterally, normal work of breathing GI: soft, nontender, nondistended, + BS MS: no deformity or atrophy Skin: warm and dry, no rash Neuro:  Strength and sensation are intact Psych: euthymic mood, full affect    Recent Labs: Lab work as above   Lipid Panel Lab Results  Component Value Date   CHOL 141 10/12/2013   HDL 43.40 10/12/2013   LDLCALC 76 10/12/2013   TRIG 106.0 10/12/2013      Wt Readings from Last 3 Encounters:  09/08/15 191 lb 8 oz (86.864 kg)  04/17/15 190 lb (86.183 kg)  03/09/15 190 lb 8 oz (86.41 kg)       ASSESSMENT AND PLAN:  Atrial fibrillation, unspecified type ([place]) - Plan: EKG 12-Lead Denies any symptoms concerning for atrial fibrillation We will continue current medications  Essential hypertension Recommended if he has lightheaded symptoms that he call us, we would decrease his diltiazem down to 120 g daily  Hyperlipidemia Cholesterol is at goal on the current lipid regimen. No changes to  the medications were made.  Aortic atherosclerosis ([place]) Cholesterol at goal, recommended smoking cessation  TOBACCO ABUSE We have encouraged him to continue to work on weaning his cigarettes and smoking cessation. He will continue to work on this and does not want any assistance with chantix.   Disposition:   F/U  12 months   Orders Placed This Encounter  Procedures  . EKG 12-Lead    Total encounter time more than 15 minutes  Greater than 50% was spent in counseling and coordination of care with the patient   Signed, Esmond Plants, M.D., Ph.D. 09/08/2015  [place]   He denies any significant episodes of

## 2015-09-08 NOTE — Patient Instructions (Signed)
Medication Instructions:   No medication changes  Labwork:  No labs needed  Testing/Procedures:  No testing needed  Follow-Up: It was a pleasure seeing you in the office today. Please call us if you have new issues that need to be addressed before your next appt.  [phone]  Your physician wants you to follow-up in: 12 months.  You will receive a reminder letter in the mail two months in advance. If you don't receive a letter, please call our office to schedule the follow-up appointment.  If you need a refill on your cardiac medications before your next appointment, please call your pharmacy.     

## 2015-09-11 DIAGNOSIS — M4802 Spinal stenosis, cervical region: Secondary | ICD-10-CM | POA: Diagnosis not present

## 2015-09-11 DIAGNOSIS — M545 Low back pain: Secondary | ICD-10-CM | POA: Diagnosis not present

## 2015-09-11 DIAGNOSIS — M961 Postlaminectomy syndrome, not elsewhere classified: Secondary | ICD-10-CM | POA: Diagnosis not present

## 2015-09-11 DIAGNOSIS — G8929 Other chronic pain: Secondary | ICD-10-CM | POA: Diagnosis not present

## 2015-10-26 ENCOUNTER — Other Ambulatory Visit: Payer: Self-pay | Admitting: Family Medicine

## 2015-11-08 ENCOUNTER — Telehealth: Payer: Self-pay | Admitting: Cardiovascular Disease

## 2015-11-08 NOTE — Telephone Encounter (Signed)
Pt c/o medication issue:  1. Name of Medication: metoprolol   2. How are you currently taking this medication (dosage and times per day)? Taking it at night   3. Are you having a reaction (difficulty breathing--STAT)? No   4. What is your medication issue?  Is very tired and is sleeping a lot more. Is noticing dizzy spells in the AM  Has some light headedness   Please advise

## 2015-11-08 NOTE — Telephone Encounter (Signed)
Spoke w/ pt.  He reports that he has felt bad for the past 1-2 weeks.  He has been taking metoprolol 50 mg once daily - cut those in 1/2 for a while, but went back to a whole pill. He has been feeling tired, sleeping most of the time and sometimes feels lightheaded. He was taking metoprolol at night; recently switched to am and feels a bit better, esp when he wakes up. Pt does have a BP monitor, but has not checked his BP. Advised pt to check his BP BID for a few days, esp when he is sx. Asked him to call back on Friday w/ readings, or sooner if readings are low. He is agreeable and appreciative of the call.

## 2015-11-17 DIAGNOSIS — M109 Gout, unspecified: Secondary | ICD-10-CM | POA: Diagnosis not present

## 2015-11-17 DIAGNOSIS — Z23 Encounter for immunization: Secondary | ICD-10-CM | POA: Diagnosis not present

## 2015-11-17 DIAGNOSIS — R42 Dizziness and giddiness: Secondary | ICD-10-CM | POA: Diagnosis not present

## 2015-11-17 DIAGNOSIS — R5383 Other fatigue: Secondary | ICD-10-CM | POA: Diagnosis not present

## 2015-11-17 DIAGNOSIS — I10 Essential (primary) hypertension: Secondary | ICD-10-CM | POA: Diagnosis not present

## 2015-11-17 DIAGNOSIS — F17210 Nicotine dependence, cigarettes, uncomplicated: Secondary | ICD-10-CM | POA: Diagnosis not present

## 2015-11-17 DIAGNOSIS — I482 Chronic atrial fibrillation: Secondary | ICD-10-CM | POA: Diagnosis not present

## 2015-11-17 DIAGNOSIS — E7800 Pure hypercholesterolemia, unspecified: Secondary | ICD-10-CM | POA: Diagnosis not present

## 2015-11-21 ENCOUNTER — Telehealth: Payer: Self-pay | Admitting: Cardiovascular Disease

## 2015-11-21 NOTE — Telephone Encounter (Signed)
Pt states he is in the doughnut hole and would like a different rx other than Xarelto.

## 2015-11-21 NOTE — Telephone Encounter (Signed)
Spoke w/ pt.  He will be in the doughnut hole until the end of the year.  Advised him that I am leaving 4 bottles of Xarelto 20 mg samples at the front desk for him to p/u at his convenience. Will try to give him samples to get him through to January.  He is appreciative and will call back w/ any other questions or concerns.

## 2015-12-08 DIAGNOSIS — H2513 Age-related nuclear cataract, bilateral: Secondary | ICD-10-CM | POA: Diagnosis not present

## 2015-12-27 ENCOUNTER — Telehealth: Payer: Self-pay | Admitting: Cardiovascular Disease

## 2015-12-27 NOTE — Telephone Encounter (Signed)
Xarelto 20 mg samples placed at front desk for pick up.

## 2015-12-27 NOTE — Telephone Encounter (Signed)
Pt would like Xarelto Samples. Please call.

## 2016-03-19 ENCOUNTER — Other Ambulatory Visit: Payer: Self-pay | Admitting: Cardiovascular Disease

## 2016-03-26 DIAGNOSIS — I10 Essential (primary) hypertension: Secondary | ICD-10-CM | POA: Diagnosis not present

## 2016-03-26 DIAGNOSIS — Z6829 Body mass index (BMI) 29.0-29.9, adult: Secondary | ICD-10-CM | POA: Diagnosis not present

## 2016-03-26 DIAGNOSIS — M545 Low back pain: Secondary | ICD-10-CM | POA: Diagnosis not present

## 2016-03-26 DIAGNOSIS — M961 Postlaminectomy syndrome, not elsewhere classified: Secondary | ICD-10-CM | POA: Diagnosis not present

## 2016-06-06 ENCOUNTER — Other Ambulatory Visit: Payer: Self-pay | Admitting: Internal Medicine

## 2016-06-06 DIAGNOSIS — I10 Essential (primary) hypertension: Secondary | ICD-10-CM | POA: Diagnosis not present

## 2016-06-06 DIAGNOSIS — E7800 Pure hypercholesterolemia, unspecified: Secondary | ICD-10-CM | POA: Diagnosis not present

## 2016-06-06 DIAGNOSIS — M545 Low back pain: Secondary | ICD-10-CM | POA: Diagnosis not present

## 2016-06-06 DIAGNOSIS — R0989 Other specified symptoms and signs involving the circulatory and respiratory systems: Secondary | ICD-10-CM

## 2016-06-06 DIAGNOSIS — I482 Chronic atrial fibrillation: Secondary | ICD-10-CM | POA: Diagnosis not present

## 2016-06-06 DIAGNOSIS — M109 Gout, unspecified: Secondary | ICD-10-CM | POA: Diagnosis not present

## 2016-06-06 DIAGNOSIS — Z0001 Encounter for general adult medical examination with abnormal findings: Secondary | ICD-10-CM | POA: Diagnosis not present

## 2016-06-06 DIAGNOSIS — Z85820 Personal history of malignant melanoma of skin: Secondary | ICD-10-CM | POA: Diagnosis not present

## 2016-06-06 DIAGNOSIS — Z1389 Encounter for screening for other disorder: Secondary | ICD-10-CM | POA: Diagnosis not present

## 2016-06-13 ENCOUNTER — Other Ambulatory Visit: Payer: PPO

## 2016-06-18 ENCOUNTER — Ambulatory Visit
Admission: RE | Admit: 2016-06-18 | Discharge: 2016-06-18 | Disposition: A | Discharge status: Home / Self Care | Payer: PPO | Source: Ambulatory Visit | Attending: Internal Medicine | Admitting: Internal Medicine

## 2016-06-18 DIAGNOSIS — R0989 Other specified symptoms and signs involving the circulatory and respiratory systems: Secondary | ICD-10-CM

## 2016-06-18 DIAGNOSIS — I6523 Occlusion and stenosis of bilateral carotid arteries: Secondary | ICD-10-CM | POA: Diagnosis not present

## 2016-06-20 ENCOUNTER — Other Ambulatory Visit: Payer: Self-pay | Admitting: Cardiovascular Disease

## 2016-08-05 ENCOUNTER — Other Ambulatory Visit: Payer: Self-pay | Admitting: Cardiovascular Disease

## 2016-08-26 ENCOUNTER — Other Ambulatory Visit: Payer: Self-pay | Admitting: Cardiovascular Disease

## 2016-08-27 ENCOUNTER — Telehealth: Payer: Self-pay

## 2016-08-27 NOTE — Telephone Encounter (Signed)
Pt would like to wait until his wife is home from [place], before he makes an 12 month appt.

## 2016-08-27 NOTE — Telephone Encounter (Signed)
-----   Message from Janan Ridge, [place] sent at 08/26/2016  9:37 AM EDT ----- Can you try to schedule a follow up appointment with Dr. Rockey Situ? Thank you!

## 2016-09-17 ENCOUNTER — Other Ambulatory Visit: Payer: Self-pay | Admitting: Cardiovascular Disease

## 2016-09-18 DIAGNOSIS — I10 Essential (primary) hypertension: Secondary | ICD-10-CM | POA: Diagnosis not present

## 2016-09-18 DIAGNOSIS — G8929 Other chronic pain: Secondary | ICD-10-CM | POA: Diagnosis not present

## 2016-09-18 DIAGNOSIS — M961 Postlaminectomy syndrome, not elsewhere classified: Secondary | ICD-10-CM | POA: Diagnosis not present

## 2016-09-18 DIAGNOSIS — M545 Low back pain: Secondary | ICD-10-CM | POA: Diagnosis not present

## 2016-09-29 NOTE — Progress Notes (Signed)
Patient ID: JOSEY FORCIER, male   DOB: February 01, 1941, 76 y.o.   MRN: [id number] Cardiology Office Note  Date:  10/01/2016   ID:  GOTTI ALWIN, DOB 1940/08/13, MRN [id number]  PCP:  Seward Carol, MD   Chief Complaint  Patient presents with  . other    12 month f/u no complaints today. Meds reviewed verbally with pt.    HPI:  Mr. Sluka is a very pleasant 76 year old gentleman with a  long history of smoking who continues to smoke one pack per day, hypertension,  paroxysmal atrial fibrillation,  PAD in the aorta,  who presents for follow-up of his atrial fibrillation.   In follow-up today he reports that he is doing well in general Back pain, balance issues, worse in the morning Weight  Down 5 pounds Has been watching his diet Tolerating current medications  He denies any significant episodes of palpitations concerning for atrial fibrillation No excessive bruising or bleeding, no hematuria or GI bleeding  Prior to starting metoprolol he did report atrial fibrillation in the evenings Denies any significant chest pain on exertion Previously discussed his smoking, tried Chantix in the past did not work Chronic problems with fatigue  Total cholesterol 143, LDL 65, performed 2015  EKG on today's visit shows normal sinus rhythm with rate 60 bpm, no significant ST or T-wave changes  Other past medical history He presented to the hospital in 2013, [place] with palpitations and shortness of breath. found to be in atrial fibrillation with RVR, rate 150 beats per minute. He was given IV Cardizem in the emergency room and he converted to normal sinus rhythm during his hospital course. He was discharged on Cardizem 120 mg daily.  Previously reported having rare episodes of atrial fibrillation lasting 20 minutes, possibly 4 episodes over the past year  Echocardiogram 11/08/2011 shows normal systolic function, normal left atrial size, otherwise normal study  PMH:   has a past  medical history of A-fib ([place]) (2013); BPH (benign prostatic hypertrophy); CAD (coronary artery disease); Cancer ([place]); Complication of anesthesia; Early cataract; ED (erectile dysfunction); GERD (gastroesophageal reflux disease); Hernia, abdominal; History of colon polyps; Hyperlipidemia; Hypertension; Lumbar herniated disc; Microscopic hematuria; Right arm weakness (04/17/2015); Spinal stenosis; and Tobacco abuse.  PSH:    Past Surgical History:  Procedure Laterality Date  . ANTERIOR CERVICAL DECOMP/DISCECTOMY FUSION N/A 06/16/2014   Procedure: ANTERIOR CERVICAL DECOMPRESSION/DISCECTOMY FUSION CERVICAL FIVE-SIX,CERVICAL SIX-SEVEN;  Surgeon: Eustace Moore, MD;  Location: [facility];  Service: Neurosurgery;  Laterality: N/A;  . BACK SURGERY    . COLONOSCOPY  2002  . INGUINAL HERNIA REPAIR    . TONSILLECTOMY    . TONSILLECTOMY      Current Outpatient Prescriptions  Medication Sig Dispense Refill  . diltiazem (CARDIZEM CD) 180 MG 24 hr capsule Take 1 capsule (180 mg total) by mouth daily. 30 capsule 0  . DULoxetine (CYMBALTA) 30 MG capsule Take 30 mg by mouth daily.     . finasteride (PROSCAR) 5 MG tablet TAKE 1 TABLET BY MOUTH EVERY DAY 90 tablet 1  . metoprolol succinate (TOPROL-XL) 50 MG 24 hr tablet TAKE 1 TABLET (50 MG TOTAL) BY MOUTH DAILY. TAKE WITH OR IMMEDIATELY FOLLOWING A MEAL. 90 tablet 3  . simvastatin (ZOCOR) 10 MG tablet TAKE 1 TABLET (10 MG TOTAL) BY MOUTH AT BEDTIME. 90 tablet 0  . terazosin (HYTRIN) 10 MG capsule TAKE ONE CAPSULE BY MOUTH AT BEDTIME 90 capsule 3  . traMADol (ULTRAM) 50 MG tablet Take 100 mg  by mouth 2 (two) times daily as needed.     Alveda Reasons 20 MG TABS tablet TAKE 1 TABLET (20 MG TOTAL) BY MOUTH DAILY WITH SUPPER. 30 tablet 7   No current facility-administered medications for this visit.     Allergies:   Patient has no known allergies.   Social History:  The patient  reports that he has been smoking Cigarettes.  He has a 60.00 pack-year smoking  history. He has never used smokeless tobacco. He reports that he does not drink alcohol or use drugs.   Family History:   family history includes Arthritis in his mother; Coronary artery disease in his father; Diabetes in his father; Hiatal hernia in his mother; Stroke in his father.    Review of Systems: Review of Systems  Constitutional: Negative.   Respiratory: Negative.   Cardiovascular: Negative.   Gastrointestinal: Negative.   Musculoskeletal: Positive for back pain.  Neurological: Negative.   Psychiatric/Behavioral: Negative.   All other systems reviewed and are negative.    PHYSICAL EXAM: VS:  BP 116/64 (BP Location: Left Arm, Patient Position: Sitting, Cuff Size: Normal)   Pulse 60   Ht 5' 9.5" (1.765 m)   Wt 186 lb 4 oz (84.5 kg)   BMI 27.11 kg/m  , BMI Body mass index is 27.11 kg/m. GEN: Well nourished, well developed, in no acute distress  HEENT: normal  Neck: no JVD, carotid bruits, or masses Cardiac: RRR; no murmurs, rubs, or gallops,no edema  Respiratory:  clear to auscultation bilaterally, normal work of breathing GI: soft, nontender, nondistended, + BS MS: no deformity or atrophy  Skin: warm and dry, no rash Neuro:  Strength and sensation are intact Psych: euthymic mood, full affect    Recent Labs: Lab work as above   Lipid Panel Lab Results  Component Value Date   CHOL 141 10/12/2013   HDL 43.40 10/12/2013   LDLCALC 76 10/12/2013   TRIG 106.0 10/12/2013      Wt Readings from Last 3 Encounters:  10/01/16 186 lb 4 oz (84.5 kg)  09/08/15 191 lb 8 oz (86.9 kg)  04/17/15 190 lb (86.2 kg)       ASSESSMENT AND PLAN:  Atrial fibrillation, unspecified type ([place]) - Plan: EKG 12-Lead Denies any symptoms concerning for atrial fibrillation  continue current medications He would check blood pressure in the morning as he has early morning fatigue  Essential hypertension Recommended if he has low blood pressure in the morning, we would decrease  his diltiazem down to 120 mg daily. He will monitor blood pressure   Hyperlipidemia Cholesterol is at goal on the current lipid regimen. No changes to the medications were made.  Aortic atherosclerosis ([place]) Cholesterol at goal, recommended smoking cessation  TOBACCO ABUSE We have encouraged him to continue to work on weaning his cigarettes and smoking cessation. He will continue to work on this and does not want any assistance with chantix.    Total encounter time more than 25 minutes  Greater than 50% was spent in counseling and coordination of care with the patient   Disposition:   F/U  12 months   Orders Placed This Encounter  Procedures  . EKG 12-Lead    Total encounter time more than 15 minutes  Greater than 50% was spent in counseling and coordination of care with the patient   Signed, Esmond Plants, M.D., Ph.D. 10/01/2016  [place]   He denies any significant episodes of

## 2016-10-01 ENCOUNTER — Encounter: Payer: Self-pay | Admitting: Cardiovascular Disease

## 2016-10-01 ENCOUNTER — Ambulatory Visit (INDEPENDENT_AMBULATORY_CARE_PROVIDER_SITE_OTHER): Payer: PPO | Admitting: Cardiovascular Disease

## 2016-10-01 VITALS — BP 116/64 | HR 60 | Ht 69.5 in | Wt 186.2 lb

## 2016-10-01 DIAGNOSIS — Z7189 Other specified counseling: Secondary | ICD-10-CM

## 2016-10-01 DIAGNOSIS — I480 Paroxysmal atrial fibrillation: Secondary | ICD-10-CM

## 2016-10-01 DIAGNOSIS — I10 Essential (primary) hypertension: Secondary | ICD-10-CM | POA: Diagnosis not present

## 2016-10-01 DIAGNOSIS — I700 Atherosclerosis of aorta: Secondary | ICD-10-CM

## 2016-10-01 DIAGNOSIS — E782 Mixed hyperlipidemia: Secondary | ICD-10-CM | POA: Diagnosis not present

## 2016-10-01 DIAGNOSIS — F17200 Nicotine dependence, unspecified, uncomplicated: Secondary | ICD-10-CM

## 2016-10-01 NOTE — Patient Instructions (Addendum)
Dr. Damita Dunnings Dr. Danise Mina  Medication Instructions:   No medication changes made  Medication Samples have been provided to the patient.  Drug name: Xarelto       Strength: 20 mg        Qty: 3 bottles LOT: [id number]  Exp.Date: 12/20   Labwork:  No new labs needed  Testing/Procedures:  No further testing at this time   Follow-Up: It was a pleasure seeing you in the office today. Please call us if you have new issues that need to be addressed before your next appt.  [phone]  Your physician wants you to follow-up in: 12 months.  You will receive a reminder letter in the mail two months in advance. If you don't receive a letter, please call our office to schedule the follow-up appointment.  If you need a refill on your cardiac medications before your next appointment, please call your pharmacy.

## 2016-10-15 ENCOUNTER — Other Ambulatory Visit: Payer: Self-pay | Admitting: Cardiovascular Disease

## 2016-11-05 ENCOUNTER — Telehealth: Payer: Self-pay | Admitting: Cardiovascular Disease

## 2016-11-05 NOTE — Telephone Encounter (Signed)
Patients wife here today to see Dr. Rockey Situ in the office and requested samples for her husband. Patients wife is on his release form and samples provided.  Medication Samples have been provided to the patients wife.  Drug name: Xarelto       Strength: 20 mg        Qty: 3 bottles  LOT: [id number]  Exp.Date: 02/21

## 2016-12-04 ENCOUNTER — Telehealth: Payer: Self-pay | Admitting: Cardiovascular Disease

## 2016-12-04 NOTE — Telephone Encounter (Signed)
Pt reports he took last dose of xarelto last night. Cost $145/month. Explained we do not have enough samples to provide medications on monthly basis. We discussed PAF but pt states he does not qualify based on annual income. I have provided phone number to the [facility].   Medication Samples have been provided to the patient.  Drug name: Xarelto       Strength: 20mg         Qty: 1bottle  LOT: [id number] Exp.Date: 3/21

## 2016-12-04 NOTE — Telephone Encounter (Signed)
Please advise about samples.

## 2016-12-04 NOTE — Telephone Encounter (Signed)
Pt would like Xarelto samples.

## 2016-12-05 ENCOUNTER — Other Ambulatory Visit: Payer: Self-pay | Admitting: Cardiovascular Disease

## 2016-12-14 ENCOUNTER — Other Ambulatory Visit: Payer: Self-pay | Admitting: Cardiovascular Disease

## 2017-02-20 ENCOUNTER — Other Ambulatory Visit: Payer: Self-pay | Admitting: Cardiovascular Disease

## 2017-04-08 DIAGNOSIS — M545 Low back pain: Secondary | ICD-10-CM | POA: Diagnosis not present

## 2017-04-08 DIAGNOSIS — I10 Essential (primary) hypertension: Secondary | ICD-10-CM | POA: Diagnosis not present

## 2017-04-08 DIAGNOSIS — M961 Postlaminectomy syndrome, not elsewhere classified: Secondary | ICD-10-CM | POA: Diagnosis not present

## 2017-04-08 DIAGNOSIS — Z6828 Body mass index (BMI) 28.0-28.9, adult: Secondary | ICD-10-CM | POA: Diagnosis not present

## 2017-04-24 DIAGNOSIS — M545 Low back pain: Secondary | ICD-10-CM | POA: Diagnosis not present

## 2017-04-24 DIAGNOSIS — M961 Postlaminectomy syndrome, not elsewhere classified: Secondary | ICD-10-CM | POA: Diagnosis not present

## 2017-05-14 DIAGNOSIS — Z6828 Body mass index (BMI) 28.0-28.9, adult: Secondary | ICD-10-CM | POA: Diagnosis not present

## 2017-05-14 DIAGNOSIS — M961 Postlaminectomy syndrome, not elsewhere classified: Secondary | ICD-10-CM | POA: Diagnosis not present

## 2017-05-14 DIAGNOSIS — I10 Essential (primary) hypertension: Secondary | ICD-10-CM | POA: Diagnosis not present

## 2017-05-14 DIAGNOSIS — M545 Low back pain: Secondary | ICD-10-CM | POA: Diagnosis not present

## 2017-06-03 ENCOUNTER — Ambulatory Visit (INDEPENDENT_AMBULATORY_CARE_PROVIDER_SITE_OTHER): Payer: PPO | Admitting: Family Medicine

## 2017-06-03 ENCOUNTER — Encounter: Payer: Self-pay | Admitting: Family Medicine

## 2017-06-03 ENCOUNTER — Encounter (INDEPENDENT_AMBULATORY_CARE_PROVIDER_SITE_OTHER): Payer: Self-pay

## 2017-06-03 VITALS — BP 126/72 | HR 64 | Temp 98.0°F | Ht 70.0 in | Wt 192.5 lb

## 2017-06-03 DIAGNOSIS — M5440 Lumbago with sciatica, unspecified side: Secondary | ICD-10-CM

## 2017-06-03 DIAGNOSIS — Z981 Arthrodesis status: Secondary | ICD-10-CM | POA: Diagnosis not present

## 2017-06-03 DIAGNOSIS — Z7189 Other specified counseling: Secondary | ICD-10-CM | POA: Diagnosis not present

## 2017-06-03 DIAGNOSIS — M109 Gout, unspecified: Secondary | ICD-10-CM

## 2017-06-03 DIAGNOSIS — F17200 Nicotine dependence, unspecified, uncomplicated: Secondary | ICD-10-CM | POA: Diagnosis not present

## 2017-06-03 DIAGNOSIS — I480 Paroxysmal atrial fibrillation: Secondary | ICD-10-CM | POA: Diagnosis not present

## 2017-06-03 MED ORDER — PREDNISONE 10 MG PO TABS: ORAL_TABLET | ORAL | 0 refills | Status: DC

## 2017-06-03 NOTE — Progress Notes (Signed)
Requesting records from prev PCP.    Considering smoking cessation.  D/w pt.  He'll update me as needed.    Med list d/w pt and he'll review it at home and update me as needed.  Sees pain clinic.  Uses hydrocodone if tramadol doesn't control his back pain.  H/o spinal stenosis.  More neck pain recently, in the last 10 days.  Some better in the last few days but with radicular pain in the R arm, down to the R hand/fingers.  He has f/u with [facility] pending for next week.  No new weakness.  He has tingling in B hand with overhead movement.    AF.  Anticoagulated.  Has seen cards.  No bleeding, no CP.  No heart racing recently.  He can feel AF when it happens, which is rare.    Gout.   Recently on allopurinol.  Taking prn colchicine.  No flare currently.  Complaint with meds.    Living will d/w pt.  Wife designated if patient were incapacitated.    PMH and SH reviewed  ROS: Per HPI unless specifically indicated in ROS section   Meds, vitals, and allergies reviewed.   GEN: nad, alert and oriented HEENT: mucous membranes moist.  R lateral eye scleral change at baseline  NECK: supple w/o LA CV: rrr PULM: ctab, no inc wob ABD: soft, +bs EXT: no edema SKIN: no acute rash No weakness in the BUE but he has tingling in B hand with overhead movement.   Grip wnl B. Normal radial pulse B

## 2017-06-03 NOTE — Patient Instructions (Addendum)
Check your meds at home and update me if changes are needed.   I'll await the notes from [place].  Take prednisone with food and update me as needed.  Take care.  Glad to see you.

## 2017-06-08 ENCOUNTER — Encounter: Payer: Self-pay | Admitting: Family Medicine

## 2017-06-08 DIAGNOSIS — Z7189 Other specified counseling: Secondary | ICD-10-CM | POA: Insufficient documentation

## 2017-06-08 NOTE — Assessment & Plan Note (Signed)
Living will d/w pt.  Wife designated if patient were incapacitated.   ?

## 2017-06-08 NOTE — Assessment & Plan Note (Signed)
Encouraged cessation.

## 2017-06-08 NOTE — Assessment & Plan Note (Signed)
Continue as is.  No change in meds.  He agrees.  Requesting old records.  >25 minutes spent in face to face time with patient, >50% spent in counselling or coordination of care, discussing prev pain tx, AF, gout, etc.

## 2017-06-08 NOTE — Assessment & Plan Note (Signed)
Anticoagulated.  Has seen cards.  No bleeding, no CP.  No heart racing recently.  He can feel AF when it happens, which is rare.  No change in meds at this point.

## 2017-06-08 NOTE — Assessment & Plan Note (Signed)
Per pain clinic.  I'll defer.

## 2017-06-08 NOTE — Assessment & Plan Note (Signed)
He has f/u pending with [facility].  D/w pt about options.  In the short run, he may benefit from short course of prednisone with routine cautions given.  He has reproducible symptoms with bilateral hand tingling with overhead movement at the shoulders.  I suspect he has at least a mild level of residual nerve root compression in the C-spine distribution.  Still okay for outpatient f/u.  All d/w pt.  He agrees.

## 2017-06-11 DIAGNOSIS — M5116 Intervertebral disc disorders with radiculopathy, lumbar region: Secondary | ICD-10-CM | POA: Diagnosis not present

## 2017-06-16 DIAGNOSIS — G894 Chronic pain syndrome: Secondary | ICD-10-CM | POA: Diagnosis not present

## 2017-06-16 DIAGNOSIS — M5416 Radiculopathy, lumbar region: Secondary | ICD-10-CM | POA: Diagnosis not present

## 2017-06-16 DIAGNOSIS — M5136 Other intervertebral disc degeneration, lumbar region: Secondary | ICD-10-CM | POA: Diagnosis not present

## 2017-06-17 ENCOUNTER — Other Ambulatory Visit: Payer: Self-pay | Admitting: Cardiovascular Disease

## 2017-06-17 ENCOUNTER — Other Ambulatory Visit: Payer: Self-pay | Admitting: *Deleted

## 2017-06-17 MED ORDER — TERAZOSIN HCL 10 MG PO CAPS: 10.0000 mg | ORAL_CAPSULE | Freq: Every day | ORAL | 1 refills | Status: DC

## 2017-07-11 ENCOUNTER — Other Ambulatory Visit: Payer: Self-pay | Admitting: Cardiovascular Disease

## 2017-07-11 NOTE — Telephone Encounter (Signed)
Please review for refill, Thanks !  

## 2017-08-05 DIAGNOSIS — I10 Essential (primary) hypertension: Secondary | ICD-10-CM | POA: Diagnosis not present

## 2017-08-05 DIAGNOSIS — M961 Postlaminectomy syndrome, not elsewhere classified: Secondary | ICD-10-CM | POA: Diagnosis not present

## 2017-08-05 DIAGNOSIS — M545 Low back pain: Secondary | ICD-10-CM | POA: Diagnosis not present

## 2017-08-09 ENCOUNTER — Other Ambulatory Visit: Payer: Self-pay | Admitting: Family Medicine

## 2017-08-20 DIAGNOSIS — F17200 Nicotine dependence, unspecified, uncomplicated: Secondary | ICD-10-CM | POA: Diagnosis not present

## 2017-08-20 DIAGNOSIS — G8929 Other chronic pain: Secondary | ICD-10-CM | POA: Diagnosis not present

## 2017-08-20 DIAGNOSIS — F17213 Nicotine dependence, cigarettes, with withdrawal: Secondary | ICD-10-CM | POA: Diagnosis not present

## 2017-08-25 ENCOUNTER — Encounter: Payer: Self-pay | Admitting: Family Medicine

## 2017-08-25 DIAGNOSIS — Z85820 Personal history of malignant melanoma of skin: Secondary | ICD-10-CM | POA: Insufficient documentation

## 2017-08-26 ENCOUNTER — Telehealth: Payer: Self-pay | Admitting: Cardiovascular Disease

## 2017-08-26 ENCOUNTER — Encounter: Payer: Self-pay | Admitting: Family Medicine

## 2017-08-26 LAB — LAB REPORT - SCANNED
ALT: 27
AST: 19
CHOLESTEROL: 138
CREATININE: 1.02
Glucose: 101
HDL: 43
HEMOGLOBIN: 16
LDL CALC: 72
TSH: 0.86
Triglycerides: 115

## 2017-08-26 NOTE — Telephone Encounter (Signed)
Patient due for 12 month f/u in August.  Dr Rockey Situ had opening tomorrow. Patient agreeable to appointment tomorrow, 08/27/17. Patient scheduled. Routing to Dr Rockey Situ.

## 2017-08-26 NOTE — Telephone Encounter (Signed)
° °  [facility] Pre-operative Risk Assessment    Request for surgical clearance:  1. What type of surgery is being performed? Spinal Cord Stimulator Trial   2. When is this surgery scheduled?  TBA  3. What type of clearance is required (medical clearance vs. Pharmacy clearance to hold med vs. Both)? Both   4. Are there any medications that need to be held prior to surgery and how long? Asking about stopping Xarelto 3 days prior and 7-10 days off   5. Practice name and name of physician performing surgery? [facility]  6. What is your office phone number [phone]   7.   What is your office fax number [phone]  8.   Anesthesia type (None, local, MAC, general) ? Not listed

## 2017-08-27 ENCOUNTER — Ambulatory Visit: Payer: PPO | Admitting: Cardiovascular Disease

## 2017-08-27 ENCOUNTER — Encounter: Payer: Self-pay | Admitting: Cardiovascular Disease

## 2017-08-27 VITALS — BP 128/78 | HR 62 | Ht 69.5 in | Wt 196.8 lb

## 2017-08-27 DIAGNOSIS — I10 Essential (primary) hypertension: Secondary | ICD-10-CM

## 2017-08-27 DIAGNOSIS — I480 Paroxysmal atrial fibrillation: Secondary | ICD-10-CM

## 2017-08-27 DIAGNOSIS — I700 Atherosclerosis of aorta: Secondary | ICD-10-CM

## 2017-08-27 DIAGNOSIS — F17200 Nicotine dependence, unspecified, uncomplicated: Secondary | ICD-10-CM | POA: Diagnosis not present

## 2017-08-27 DIAGNOSIS — E782 Mixed hyperlipidemia: Secondary | ICD-10-CM | POA: Diagnosis not present

## 2017-08-27 NOTE — Patient Instructions (Signed)

## 2017-08-27 NOTE — Progress Notes (Signed)
Patient ID: Greg Hughes, male   DOB: 1940-12-29, 77 y.o.   MRN: [id number] Cardiology Office Note  Date:  08/27/2017   ID:  Greg Hughes, Greg Hughes [birth year], MRN [id number]  PCP:  Tonia Ghent, MD   Chief Complaint  Patient presents with  . Other    12 month follow up. Meds reviewed by the pt. verbally. 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   He continues to smoke Chronic Back pain, comes and goes, severe Thinking about spinal cord stimultor This [facility]  Rare atrial fib, 3 x Episodes over the past year Lasting 30 min to one hour No excessive bruising or bleeding, no hematuria or GI bleeding  Does not want to try Chantix, had bad dreams Chronic problems with fatigue  Total cholesterol 138  EKG personally reviewed by myself on todays visit Shows normal sinus rhythm rate 62 bpm no significant ST or T-wave changes  Other past medical history He presented to the hospital in 2013, [place] with palpitations and shortness of breath. found to be in atrial fibrillation with RVR, rate 150 beats per minute. He was given IV Cardizem in the emergency room and he converted to normal sinus rhythm during his hospital course. He was discharged on Cardizem 120 mg daily.  Previously reported having rare episodes of atrial fibrillation lasting 20 minutes, possibly 4 episodes over the past year  Echocardiogram 11/08/2011 shows normal systolic function, normal left atrial size, otherwise normal study  PMH:   has a past medical history of A-fib ([place]) (2013), BPH (benign prostatic hypertrophy), CAD (coronary artery disease), Cancer ([place]), Complication of anesthesia, Early cataract, ED (erectile dysfunction), GERD (gastroesophageal reflux  disease), Hernia, abdominal, History of colon polyps, Hyperlipidemia, Hypertension, Lumbar herniated disc, Microscopic hematuria, Right arm weakness (04/17/2015), Spinal stenosis, and Tobacco abuse.  PSH:    Past Surgical History:  Procedure Laterality Date  . ANTERIOR CERVICAL DECOMP/DISCECTOMY FUSION N/A 06/16/2014   Procedure: ANTERIOR CERVICAL DECOMPRESSION/DISCECTOMY FUSION CERVICAL FIVE-SIX,CERVICAL SIX-SEVEN;  Surgeon: Eustace Moore, MD;  Location: [facility];  Service: Neurosurgery;  Laterality: N/A;  . BACK SURGERY    . COLONOSCOPY  2002  . INGUINAL HERNIA REPAIR    . TONSILLECTOMY      Current Outpatient Medications  Medication Sig Dispense Refill  . allopurinol (ZYLOPRIM) 100 MG tablet Take 100 mg by mouth daily.    Marland Kitchen allopurinol (ZYLOPRIM) 300 MG tablet TAKE 1 TABLET BY MOUTH ONCE DAILY 30 tablet 0  . colchicine 0.6 MG tablet Take 0.6 mg by mouth daily as needed (for gout).    Marland Kitchen diltiazem (TIAZAC) 180 MG 24 hr capsule TAKE 1 CAPSULE BY MOUTH ONCE DAILY 30 capsule 3  . finasteride (PROSCAR) 5 MG tablet TAKE 1 TABLET BY MOUTH EVERY DAY 90 tablet 1  . HYDROcodone-acetaminophen (NORCO/VICODIN) 5-325 MG tablet Take 1 tablet by mouth every 6 (six) hours as needed for severe pain.    . metoprolol succinate (TOPROL-XL) 50 MG 24 hr tablet TAKE 1 TABLET (50 MG TOTAL) BY MOUTH DAILY. TAKE WITH OR IMMEDIATELY FOLLOWING A MEAL. 90 tablet 3  . simvastatin (ZOCOR) 10 MG tablet TAKE 1 TABLET (10 MG TOTAL) BY MOUTH AT BEDTIME. 90 tablet 2  . terazosin (HYTRIN)  10 MG capsule Take 1 capsule (10 mg total) by mouth at bedtime. 90 capsule 1  . traMADol (ULTRAM) 50 MG tablet Take 100 mg by mouth 2 (two) times daily as needed.     Greg Hughes 20 MG TABS tablet TAKE 1 TABLET BY MOUTH ONCE DAILY 30 tablet 6   No current facility-administered medications for this visit.     Allergies:   Patient has no known allergies.   Social History:  The patient  reports that he has been smoking cigarettes.  He has  a 60.00 pack-year smoking history. He has never used smokeless tobacco. He reports that he does not drink alcohol or use drugs.   Family History:   family history includes Arthritis in his mother; Coronary artery disease in his father; Diabetes in his father; Hiatal hernia in his mother; Pulmonary embolism in his mother; Stroke in his father.    Review of Systems: Review of Systems  Constitutional: Negative.   Respiratory: Negative.   Cardiovascular: Negative.   Gastrointestinal: Negative.   Musculoskeletal: Positive for back pain.  Neurological: Negative.   Psychiatric/Behavioral: Negative.   All other systems reviewed and are negative.    PHYSICAL EXAM: VS:  BP 128/78 (BP Location: Left Arm, Patient Position: Sitting, Cuff Size: Normal)   Pulse 62   Ht 5' 9.5" (1.765 m)   Wt 196 lb 12 oz (89.2 kg)   BMI 28.64 kg/m  , BMI Body mass index is 28.64 kg/m. Constitutional:  oriented to person, place, and time. No distress.  HENT:  Head: Normocephalic and atraumatic.  Eyes:  no discharge. No scleral icterus.  Neck: Normal range of motion. Neck supple. No JVD present.  Cardiovascular: Normal rate, regular rhythm, normal heart sounds and intact distal pulses. Exam reveals no gallop and no friction rub. No edema No murmur heard. Pulmonary/Chest: Effort normal and breath sounds normal. No stridor. No respiratory distress.  no wheezes.  no rales.  no tenderness.  Abdominal: Soft.  no distension.  no tenderness.  Musculoskeletal: Normal range of motion.  no  tenderness or deformity.  Neurological:  normal muscle tone. Coordination normal. No atrophy Skin: Skin is warm and dry. No rash noted. not diaphoretic.  Psychiatric:  normal mood and affect. behavior is normal. Thought content normal.   Recent Labs: Lab work as above   Lipid Panel Lab Results  Component Value Date   CHOL 138 06/06/2016   HDL 43.40 10/12/2013   LDLCALC 72 06/06/2016   TRIG 115 06/06/2016      Wt  Readings from Last 3 Encounters:  08/27/17 196 lb 12 oz (89.2 kg)  06/03/17 192 lb 8 oz (87.3 kg)  10/01/16 186 lb 4 oz (84.5 kg)      ASSESSMENT AND PLAN:  Atrial fibrillation, unspecified type ([place]) - Plan: EKG 12-Lead Rare atrial fib episodes No medication changes made Tolerating anticoagulation  Essential hypertension We'll continue current medications diltiazem and metoprolol Stable  Hyperlipidemia Cholesterol is at goal on the current lipid regimen. No changes to the medications were made.  Aortic atherosclerosis ([place]) Cholesterol at goal, recommended smoking cessation Long discussion concerning smoking cessation techniques  TOBACCO ABUSE He does not want Chantix He will try vapor cigarette has bridge to stopping with quit date   Total encounter time more than 25 minutes  Greater than 50% was spent in counseling and coordination of care with the patient   Disposition:   F/U  12 months   No orders of the defined types were  placed in this encounter.   Total encounter time more than 15 minutes  Greater than 50% was spent in counseling and coordination of care with the patient   Signed, Esmond Plants, M.D., Ph.D. 08/27/2017  [place]   He denies any significant episodes of

## 2017-08-30 NOTE — Telephone Encounter (Signed)
Acceptable risk to hold xarelto for spinal cord stimulator

## 2017-09-01 NOTE — Telephone Encounter (Signed)
Routed to number provided via EPIC fax.  

## 2017-09-16 ENCOUNTER — Other Ambulatory Visit: Payer: Self-pay | Admitting: Family Medicine

## 2017-09-26 ENCOUNTER — Ambulatory Visit (INDEPENDENT_AMBULATORY_CARE_PROVIDER_SITE_OTHER): Payer: PPO

## 2017-09-26 VITALS — BP 130/80 | HR 53 | Temp 97.5°F | Ht 69.5 in | Wt 194.0 lb

## 2017-09-26 DIAGNOSIS — Z125 Encounter for screening for malignant neoplasm of prostate: Secondary | ICD-10-CM

## 2017-09-26 DIAGNOSIS — E782 Mixed hyperlipidemia: Secondary | ICD-10-CM | POA: Diagnosis not present

## 2017-09-26 DIAGNOSIS — Z0000 Encounter for general adult medical examination without abnormal findings: Secondary | ICD-10-CM | POA: Diagnosis not present

## 2017-09-26 DIAGNOSIS — M1000 Idiopathic gout, unspecified site: Secondary | ICD-10-CM

## 2017-09-26 DIAGNOSIS — I10 Essential (primary) hypertension: Secondary | ICD-10-CM | POA: Diagnosis not present

## 2017-09-26 LAB — CBC WITH DIFFERENTIAL/PLATELET
BASOS ABS: 0 10*3/uL (ref 0.0–0.1)
BASOS PCT: 0.5 % (ref 0.0–3.0)
EOS ABS: 0.1 10*3/uL (ref 0.0–0.7)
Eosinophils Relative: 1 % (ref 0.0–5.0)
HCT: 48 % (ref 39.0–52.0)
HEMOGLOBIN: 15.9 g/dL (ref 13.0–17.0)
Lymphocytes Relative: 43.6 % (ref 12.0–46.0)
Lymphs Abs: 4.4 10*3/uL — ABNORMAL HIGH (ref 0.7–4.0)
MCHC: 33.1 g/dL (ref 30.0–36.0)
MCV: 93.2 fl (ref 78.0–100.0)
MONOS PCT: 4.8 % (ref 3.0–12.0)
Monocytes Absolute: 0.5 10*3/uL (ref 0.1–1.0)
NEUTROS ABS: 5.1 10*3/uL (ref 1.4–7.7)
Neutrophils Relative %: 50.1 % (ref 43.0–77.0)
PLATELETS: 217 10*3/uL (ref 150.0–400.0)
RBC: 5.15 Mil/uL (ref 4.22–5.81)
RDW: 15 % (ref 11.5–15.5)
WBC: 10.2 10*3/uL (ref 4.0–10.5)

## 2017-09-26 LAB — LIPID PANEL
CHOLESTEROL: 152 mg/dL (ref 0–200)
HDL: 46.6 mg/dL (ref >39.00)
LDL CALC: 79 mg/dL (ref 0–99)
NonHDL: 105.71
Total CHOL/HDL Ratio: 3
Triglycerides: 134 mg/dL (ref 0.0–149.0)
VLDL: 26.8 mg/dL (ref 0.0–40.0)

## 2017-09-26 LAB — COMPREHENSIVE METABOLIC PANEL
ALK PHOS: 101 U/L (ref 39–117)
ALT: 29 U/L (ref 0–53)
AST: 17 U/L (ref 0–37)
Albumin: 4.3 g/dL (ref 3.5–5.2)
BILIRUBIN TOTAL: 0.6 mg/dL (ref 0.2–1.2)
BUN: 16 mg/dL (ref 6–23)
CALCIUM: 10.2 mg/dL (ref 8.4–10.5)
CO2: 29 meq/L (ref 19–32)
CREATININE: 1.15 mg/dL (ref 0.40–1.50)
Chloride: 106 mEq/L (ref 96–112)
GFR: 65.51 mL/min (ref >60.00)
GLUCOSE: 102 mg/dL — AB (ref 70–99)
Potassium: 4.5 mEq/L (ref 3.5–5.1)
Sodium: 142 mEq/L (ref 135–145)
TOTAL PROTEIN: 7.4 g/dL (ref 6.0–8.3)

## 2017-09-26 LAB — TSH: TSH: 1.07 u[IU]/mL (ref 0.35–4.50)

## 2017-09-26 LAB — URIC ACID: Uric Acid, Serum: 5.1 mg/dL (ref 4.0–7.8)

## 2017-09-26 LAB — PSA, MEDICARE: PSA: 3.41 ng/mL (ref 0.10–4.00)

## 2017-09-26 NOTE — Patient Instructions (Signed)
Greg Hughes , Thank you for taking time to come for your Medicare Wellness Visit. I appreciate your ongoing commitment to your health goals. Please review the following plan we discussed and let me know if I can assist you in the future.   These are the goals we discussed: Goals    . Patient Stated     Starting 09/26/2017, I will continue to take medications as prescribed.        This is a list of the screening recommended for you and due dates:  Health Maintenance  Topic Date Due  . Flu Shot  05/19/2018*  . Colon Cancer Screening  08/18/2018*  . Tetanus Vaccine  05/30/2019  . Pneumonia vaccines  Completed  *Topic was postponed. The date shown is not the original due date.   Preventive Care for Adults  A healthy lifestyle and preventive care can promote health and wellness. Preventive health guidelines for adults include the following key practices.  . A routine yearly physical is a good way to check with your health care provider about your health and preventive screening. It is a chance to share any concerns and updates on your health and to receive a thorough exam.  . Visit your dentist for a routine exam and preventive care every 6 months. Brush your teeth twice a day and floss once a day. Good oral hygiene prevents tooth decay and gum disease.  . The frequency of eye exams is based on your age, health, family medical history, use  of contact lenses, and other factors. Follow your health care provider's recommendations for frequency of eye exams.  . Eat a healthy diet. Foods like vegetables, fruits, whole grains, low-fat dairy products, and lean protein foods contain the nutrients you need without too many calories. Decrease your intake of foods high in solid fats, added sugars, and salt. Eat the right amount of calories for you. Get information about a proper diet from your health care provider, if necessary.  . Regular physical exercise is one of the most important things you  can do for your health. Most adults should get at least 150 minutes of moderate-intensity exercise (any activity that increases your heart rate and causes you to sweat) each week. In addition, most adults need muscle-strengthening exercises on 2 or more days a week.  Silver Sneakers may be a benefit available to you. To determine eligibility, you may visit the website: www.silversneakers.com or contact program at 575-562-5618 Mon-Fri between 8AM-8PM.   . Maintain a healthy weight. The body mass index (BMI) is a screening tool to identify possible weight problems. It provides an estimate of body fat based on height and weight. Your health care provider can find your BMI and can help you achieve or maintain a healthy weight.   For adults 20 years and older: ? A BMI below 18.5 is considered underweight. ? A BMI of 18.5 to 24.9 is normal. ? A BMI of 25 to 29.9 is considered overweight. ? A BMI of 30 and above is considered obese.   . Maintain normal blood lipids and cholesterol levels by exercising and minimizing your intake of saturated fat. Eat a balanced diet with plenty of fruit and vegetables. Blood tests for lipids and cholesterol should begin at age 42 and be repeated every 5 years. If your lipid or cholesterol levels are high, you are over 50, or you are at high risk for heart disease, you may need your cholesterol levels checked more frequently. Ongoing high lipid and  cholesterol levels should be treated with medicines if diet and exercise are not working.  . If you smoke, find out from your health care provider how to quit. If you do not use tobacco, please do not start.  . If you choose to drink alcohol, please do not consume more than 2 drinks per day. One drink is considered to be 12 ounces (355 mL) of beer, 5 ounces (148 mL) of wine, or 1.5 ounces (44 mL) of liquor.  . If you are 77-36 years old, ask your health care provider if you should take aspirin to prevent strokes.  . Use  sunscreen. Apply sunscreen liberally and repeatedly throughout the day. You should seek shade when your shadow is shorter than you. Protect yourself by wearing long sleeves, pants, a wide-brimmed hat, and sunglasses year round, whenever you are outdoors.  . Once a month, do a whole body skin exam, using a mirror to look at the skin on your back. Tell your health care provider of new moles, moles that have irregular borders, moles that are larger than a pencil eraser, or moles that have changed in shape or color.

## 2017-09-29 DIAGNOSIS — M5416 Radiculopathy, lumbar region: Secondary | ICD-10-CM | POA: Diagnosis not present

## 2017-09-29 DIAGNOSIS — G894 Chronic pain syndrome: Secondary | ICD-10-CM | POA: Diagnosis not present

## 2017-09-29 DIAGNOSIS — M5136 Other intervertebral disc degeneration, lumbar region: Secondary | ICD-10-CM | POA: Diagnosis not present

## 2017-10-01 ENCOUNTER — Encounter: Payer: Self-pay | Admitting: Family Medicine

## 2017-10-01 ENCOUNTER — Ambulatory Visit (INDEPENDENT_AMBULATORY_CARE_PROVIDER_SITE_OTHER): Payer: PPO | Admitting: Family Medicine

## 2017-10-01 VITALS — BP 120/72 | HR 60 | Temp 98.5°F | Ht 69.5 in | Wt 198.8 lb

## 2017-10-01 DIAGNOSIS — E782 Mixed hyperlipidemia: Secondary | ICD-10-CM | POA: Diagnosis not present

## 2017-10-01 DIAGNOSIS — I10 Essential (primary) hypertension: Secondary | ICD-10-CM

## 2017-10-01 DIAGNOSIS — R918 Other nonspecific abnormal finding of lung field: Secondary | ICD-10-CM | POA: Diagnosis not present

## 2017-10-01 DIAGNOSIS — Z7189 Other specified counseling: Secondary | ICD-10-CM

## 2017-10-01 DIAGNOSIS — M48061 Spinal stenosis, lumbar region without neurogenic claudication: Secondary | ICD-10-CM | POA: Diagnosis not present

## 2017-10-01 DIAGNOSIS — R972 Elevated prostate specific antigen [PSA]: Secondary | ICD-10-CM | POA: Diagnosis not present

## 2017-10-01 DIAGNOSIS — M1000 Idiopathic gout, unspecified site: Secondary | ICD-10-CM

## 2017-10-01 DIAGNOSIS — Z0000 Encounter for general adult medical examination without abnormal findings: Secondary | ICD-10-CM

## 2017-10-01 MED ORDER — TERAZOSIN HCL 10 MG PO CAPS: 10.0000 mg | ORAL_CAPSULE | Freq: Every day | ORAL | 3 refills | Status: DC

## 2017-10-01 MED ORDER — SIMVASTATIN 10 MG PO TABS: ORAL_TABLET | ORAL | 3 refills | Status: DC

## 2017-10-01 MED ORDER — ALLOPURINOL 300 MG PO TABS: 300.0000 mg | ORAL_TABLET | Freq: Every day | ORAL | 3 refills | Status: DC

## 2017-10-01 NOTE — Patient Instructions (Addendum)
Call the the GI clinic about a possible colonoscopy.   We will call about your referral.  Greg Hughes or Greg Hughes will call you if you don't see one of them on the way out.  Don't change your meds for now.  Take care.  Glad to see you.

## 2017-10-01 NOTE — Progress Notes (Signed)
Smoking cessation d/w pt.  He is trying E cig to cut back.  Encouraged.    Gout. On allopurinol.  No flares.  No ADE on med.  Labs d/w pt. uric acid at goal.   PSA inc noted.  D/w pt. On finasteride.  Discussed with patient about urology referral. Prev imaging d/w pt.  MRI 2015 with: 1. The lesion of concern in the left kidney is compatible with a small Bosniak class 2 cyst. This is a benign finding. If there are multiple other simple cysts in the kidneys bilaterally as well. 2. Multiple small simple hepatic cysts. 3. 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  Hypertension:    Using medication without problems or lightheadedness: yes Chest pain with exertion:no Edema:no Short of breath:no Still on anticoagulation.  L sided nose bleed a few weeks ago but no bleeding o/w, unless he blows his nose hard.    Elevated Cholesterol: Using medications without problems: yes Muscle aches: not from statin Diet compliance: "not good."  Encouraged, d/w pt.  Exercise: "not good."  Encouraged, d/w pt. Limited by back pain.    Spinal stenosis with ongoing tramadol use.  Med helps some.  He is considering options.  He does not want to have a significant intervention if possible.  Flu and shingles vaccines d/w pt, encouraged.   See AVS re: colonoscopy.   Living will d/w pt.  Wife designated if patient were incapacitated.   Prev lung CT with unchanged bilateral pulmonary nodules stable dating back to 09/30/2012, most compatible with benign etiology. D/w pt.   PMH and SH reviewed  Meds, vitals, and allergies reviewed.   ROS: Per HPI unless specifically indicated in ROS section   GEN: nad, alert and oriented HEENT: mucous membranes moist NECK: supple w/o LA CV: IRR PULM: ctab, no inc wob ABD: soft, +bs EXT: no edema SKIN: no acute rash

## 2017-10-03 DIAGNOSIS — R972 Elevated prostate specific antigen [PSA]: Secondary | ICD-10-CM | POA: Insufficient documentation

## 2017-10-03 DIAGNOSIS — Z0000 Encounter for general adult medical examination without abnormal findings: Secondary | ICD-10-CM | POA: Insufficient documentation

## 2017-10-03 NOTE — Assessment & Plan Note (Signed)
No change in meds at this point.  Labs discussed with patient.  Continue as is.  He agrees.  Reasonable control.

## 2017-10-03 NOTE — Assessment & Plan Note (Signed)
He is putting up with pain as is for now.  He is considering options.  I will defer.

## 2017-10-03 NOTE — Assessment & Plan Note (Signed)
Continue statin.  Continue work on diet.  Exercise is limited.  Labs discussed with patient.

## 2017-10-03 NOTE — Assessment & Plan Note (Signed)
No f/u needed based on previous imaging. d/w pt.

## 2017-10-03 NOTE — Assessment & Plan Note (Signed)
Living will d/w pt.  Wife designated if patient were incapacitated.   ?

## 2017-10-03 NOTE — Assessment & Plan Note (Signed)
Flu and shingles vaccines d/w pt, encouraged.   See AVS re: colonoscopy.   Living will d/w pt.  Wife designated if patient were incapacitated.

## 2017-10-03 NOTE — Assessment & Plan Note (Signed)
D/w pt.  On finasteride, so one would normally double his PSA value for the corrected or true value..  Discussed with patient about urology referral. Prev imaging d/w pt. rectal exam deferred.  He agrees with plan.  MRI 2015 with: 1. The lesion of concern in the left kidney is compatible with a small Bosniak class 2 cyst. This is a benign finding. If there are multiple other simple cysts in the kidneys bilaterally as well. 2. Multiple small simple hepatic cysts. 3. 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  >25 minutes spent in face to face time with patient, >50% spent in counselling or coordination of care.

## 2017-10-03 NOTE — Assessment & Plan Note (Signed)
No symptoms currently.  Continue as is.

## 2017-10-17 ENCOUNTER — Other Ambulatory Visit: Payer: Self-pay | Admitting: Cardiovascular Disease

## 2017-10-17 ENCOUNTER — Other Ambulatory Visit: Payer: Self-pay | Admitting: Family Medicine

## 2017-10-17 DIAGNOSIS — Z1211 Encounter for screening for malignant neoplasm of colon: Secondary | ICD-10-CM

## 2017-10-17 NOTE — Telephone Encounter (Signed)
Electronic refill request. Finasteride Last office visit:   10/01/17 Last Filled:    90 tablet 1 01/02/2015  Please advise.

## 2017-10-20 NOTE — Telephone Encounter (Signed)
Sent.  When is he going to see urology?  Please let me know. Thanks.

## 2017-10-21 NOTE — Telephone Encounter (Signed)
Spoke to patient and was advised that he has an appointment scheduled with urology October 24, 2017.  Patient stated that he thinks that he is to have a referral to GI for a colonoscopy. Advised patient that message will be sent to Dr. Damita Dunnings and he will hear back from the referral coordinator to get this set up.

## 2017-10-23 ENCOUNTER — Other Ambulatory Visit: Payer: Self-pay | Admitting: Cardiovascular Disease

## 2017-10-23 NOTE — Telephone Encounter (Signed)
GI referral placed.  Thanks.

## 2017-10-27 ENCOUNTER — Ambulatory Visit (INDEPENDENT_AMBULATORY_CARE_PROVIDER_SITE_OTHER): Payer: Self-pay | Admitting: Urology

## 2017-10-27 ENCOUNTER — Encounter: Payer: Self-pay | Admitting: Gastroenterology

## 2017-10-27 VITALS — BP 138/74 | HR 66 | Ht 69.0 in | Wt 197.0 lb

## 2017-10-27 DIAGNOSIS — R3914 Feeling of incomplete bladder emptying: Secondary | ICD-10-CM

## 2017-10-27 DIAGNOSIS — R972 Elevated prostate specific antigen [PSA]: Secondary | ICD-10-CM

## 2017-10-27 DIAGNOSIS — N401 Enlarged prostate with lower urinary tract symptoms: Secondary | ICD-10-CM

## 2017-10-27 LAB — BLADDER SCAN AMB NON-IMAGING: SCAN RESULT: 181

## 2017-10-27 NOTE — Progress Notes (Signed)
10/27/2017 1:04 PM   Greg Hughes Jan 13, 1941 [id number]  Referring provider: Tonia Ghent, MD [place], [place] [address]  CC: Elevated PSA, LUTS  HPI: I the pleasure of seeing Greg Hughes in urology clinic today for mildly elevated PSA and chronic urinary symptoms.  He is a 77 year old man with no family history of prostate cancer, and chronic lower urinary symptoms on terazosin and finasteride.  His PSA was checked 09/26/2017 was found to be 3.41, corrected on finasteride 6.8.  PSA in 2015 on finasteride was 2.4, corrected 4.8.  His urinary symptoms are primarily nocturia 3-4 times per night, weak stream, and feeling of incomplete emptying.  He is unsatisfied with his urinary symptoms on maximal medical therapy and he is interested in pursuing surgical options.  There are no aggravating or alleviating factors.  The severity is moderate.  The duration is greater than 1 year.  PVR 190 cc. IPSS score 17(moderate), mostly dissatisfied    PMH: Past Medical History:  Diagnosis Date  . A-fib ([facility]) 2013  . BPH (benign prostatic hypertrophy)   . CAD (coronary artery disease)   . 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 Complication of anesthesia    " I fainted a couple of times when they went to get me up."  . Early cataract   . ED (erectile dysfunction)   . GERD (gastroesophageal reflux disease)   . Hernia, abdominal    small  . History of colon polyps   . Hyperlipidemia   . Hypertension   . Lumbar herniated disc   . Microscopic hematuria   . Right arm weakness 04/17/2015  . Spinal stenosis   . Tobacco abuse     Surgical History: Past Surgical History:  Procedure Laterality Date  . ANTERIOR CERVICAL DECOMP/DISCECTOMY FUSION N/A 06/16/2014   Procedure: ANTERIOR CERVICAL DECOMPRESSION/DISCECTOMY FUSION CERVICAL FIVE-SIX,CERVICAL SIX-SEVEN;  Surgeon: Eustace Moore, MD;  Location: [facility];  Service: Neurosurgery;  Laterality:  N/A;  . BACK SURGERY    . COLONOSCOPY  2002  . INGUINAL HERNIA REPAIR    . TONSILLECTOMY      Allergies: No Known Allergies  Family History: Family History  Problem Relation Age of Onset  . Diabetes Father   . Coronary artery disease Father   . Stroke Father   . Arthritis Mother   . Hiatal hernia Mother   . Pulmonary embolism Mother   . Colon cancer Neg Hx   . Prostate cancer Neg Hx     Social History:  reports that he has been smoking cigarettes. He has a 60.00 pack-year smoking history. He has never used smokeless tobacco. He reports that he does not drink alcohol or use drugs.  ROS: Please see flowsheet from today's date for complete review of systems.  Physical Exam: BP 138/74   Pulse 66   Ht 5\' 9"  (1.753 m)   Wt 197 lb (89.4 kg)   BMI 29.09 kg/m    Constitutional:  Alert and oriented, No acute distress. Cardiovascular: No clubbing, cyanosis, or edema. Respiratory: Normal respiratory effort, no increased work of breathing. GI: Abdomen is soft, nontender, nondistended, no abdominal masses GU: No CVA tenderness DRE: 40g, smooth, no nodules Lymph: No cervical or inguinal lymphadenopathy. Skin: No rashes, bruises or suspicious lesions. Neurologic: Grossly intact, no focal deficits, moving all 4 extremities. Psychiatric: Normal mood and affect.  Pertinent Imaging: I have personally reviewed the CT urogram from 2016,  prostate measures 42g, small median lobe.  Assessment & Plan:   In summary, Greg Hughes is a 77 year old male with no family history of prostate cancer, chronic back pain, atrial fibrillation on Xarelto, and moderate urinary symptoms despite maximal medical therapy, and elevated PVR.  Regarding his mildly elevated PSA of 6.8 corrected for finasteride, we discussed at length the AUA guidelines not recommending screening in men over age 57.  With his mild elevation and relative stability of his PSA I would not recommend pursuing biopsy at this time.  We  discussed the small, but not zero, risk of missing a clinically significant prostate cancer.  Regarding his urinary symptoms we discussed at length options including UroLift, transurethral resection of the prostate, and HOLEP.  We also discussed behavioral strategies including minimizing fluids in the evenings, timed and double voiding, and avoiding bladder irritants.  On recent CT his gland measures 42 g.  One advantage of TURP would be prostate tissue to send to pathology.  He would like to do some research on TURP, but we'll meet again in the next 2 weeks to discuss surgical options.  Return in about 2 weeks (around 11/10/2017) for discuss turp.  Billey Co, [facility] [address], [place], [place] [address] [phone]

## 2017-10-27 NOTE — Patient Instructions (Signed)
Transurethral Resection of the Prostate Transurethral resection of the prostate (TURP) is the removal (resection) of part of the gland that produces semen (prostate gland). This procedure is done to treat benign prostatic hyperplasia (BPH). BPH is an abnormal, noncancerous (benign) increase in the number of cells that make up the prostate tissue. BPH causes the prostate to get bigger. The enlarged prostate can push against or block the tube that drains urine from the bladder out of the body (urethra). BPH can affect normal urine flow by causing bladder infections, difficulty controlling bladder function, and difficulty emptying the bladder. The goal of TURP is to remove enough prostate tissue to allow for a normal flow of urine. In a transurethral resection, a thin telescope with a light, a tiny camera, and an electric cutting edge (resectoscope) is passed through the urethra and into the prostate. The opening of the urethra is at the end of the penis. Tell a health care provider about:  Any allergies you have.  All medicines you are taking, including vitamins, herbs, eye drops, creams, and over-the-counter medicines.  Any problems you or family members have had with anesthetic medicines.  Any blood disorders you have.  Any surgeries you have had.  Any medical conditions you have.  Any prostate infections you have had. What are the risks? Generally, this is a safe procedure. However, problems may occur, including:  Infection.  Bleeding.  Allergic reactions to medicines.  Damage to other structures or organs, such as: ? The urethra. ? The bladder. ? Muscles that surround the prostate.  Difficulty getting an erection.  Difficulty controlling urination (incontinence).  Scarring, which may cause problems with urine flow.  What happens before the procedure?  Follow instructions from your health care provider about eating or drinking restrictions.  Ask your health care provider  about: ? Changing or stopping your regular medicines. This is especially important if you are taking diabetes medicines or blood thinners. ? Taking medicines such as aspirin and ibuprofen. These medicines can thin your blood. Do not take these medicines before your procedure if your health care provider instructs you not to.  You may have a physical exam.  You may have a blood or urine sample taken.  You may be given antibiotic medicine to help prevent infection.  Ask your health care provider how your surgical site will be marked or identified.  Plan to have someone take you home after the procedure. You may not be able to drive for up to 10 days after your procedure. What happens during the procedure?  To reduce your risk of infection: ? Your health care team will wash or sanitize their hands. ? Your skin will be washed with soap.  An IV tube will be inserted into one of your veins.  You will be given one or more of the following: ? A medicine to help you relax (sedative). ? A medicine to make you fall asleep (general anesthetic). ? A medicine that is injected into your spine to numb the area below and slightly above the injection site (spinal anesthetic).  Your legs will be placed in foot rests (stirrups) so that your legs are apart and your knees are bent.  The resectoscope will be passed through your urethra to your prostate.  Parts of your prostate will be resected using the cutting edge of the resectoscope.  The resectoscope will be removed.  A thin, flexible tube (catheter) will be passed through your urethra and into your bladder. The catheter will drain  urine into a bag outside of your body. ? Fluid may be passed through the catheter to keep the catheter open. The procedure may vary among health care providers and hospitals. What happens after the procedure?  Your blood pressure, heart rate, breathing rate, and blood oxygen level will be monitored often until the  medicines you were given have worn off.  You may continue to receive fluids and medicines through an IV tube.  You may have some pain. Pain medicine will be available to help you.  You will have a catheter draining your urine. ? You may have blood in your urine. Your catheter may be kept in until your urine is clear. ? Your urinary drainage will be monitored. If necessary, your bladder may be rinsed out (irrigated) through your catheter.  You will be encouraged to walk around as soon as possible.  You may have to wear compression stockings. These stockings help prevent blood clots and reduce swelling in your legs.  Do not drive for 24 hours if you received a sedative. This information is not intended to replace advice given to you by your health care provider. Make sure you discuss any questions you have with your health care provider. Document Released: 02/04/2005 Document Revised: 10/08/2015 Document Reviewed: 10/27/2014 Elsevier Interactive Patient Education  2018 [employer].

## 2017-10-28 LAB — URINALYSIS, COMPLETE
Bilirubin, UA: NEGATIVE
Glucose, UA: NEGATIVE
KETONES UA: NEGATIVE
LEUKOCYTES UA: NEGATIVE
NITRITE UA: NEGATIVE
PROTEIN UA: NEGATIVE
SPEC GRAV UA: 1.01 (ref 1.005–1.030)
Urobilinogen, Ur: 0.2 mg/dL (ref 0.2–1.0)
pH, UA: 5.5 (ref 5.0–7.5)

## 2017-10-28 LAB — MICROSCOPIC EXAMINATION
BACTERIA UA: NONE SEEN
Epithelial Cells (non renal): NONE SEEN /hpf (ref 0–10)
WBC, UA: NONE SEEN /hpf (ref 0–5)

## 2017-11-05 DIAGNOSIS — I10 Essential (primary) hypertension: Secondary | ICD-10-CM | POA: Diagnosis not present

## 2017-11-05 DIAGNOSIS — G8929 Other chronic pain: Secondary | ICD-10-CM | POA: Diagnosis not present

## 2017-11-05 DIAGNOSIS — M961 Postlaminectomy syndrome, not elsewhere classified: Secondary | ICD-10-CM | POA: Diagnosis not present

## 2017-11-05 DIAGNOSIS — Z6829 Body mass index (BMI) 29.0-29.9, adult: Secondary | ICD-10-CM | POA: Diagnosis not present

## 2017-11-12 ENCOUNTER — Ambulatory Visit (INDEPENDENT_AMBULATORY_CARE_PROVIDER_SITE_OTHER): Payer: Self-pay | Admitting: Urology

## 2017-11-12 ENCOUNTER — Encounter: Payer: Self-pay | Admitting: Urology

## 2017-11-12 VITALS — BP 120/71 | HR 63 | Ht 69.0 in | Wt 195.7 lb

## 2017-11-12 DIAGNOSIS — N401 Enlarged prostate with lower urinary tract symptoms: Secondary | ICD-10-CM

## 2017-11-12 DIAGNOSIS — R3914 Feeling of incomplete bladder emptying: Secondary | ICD-10-CM

## 2017-11-12 NOTE — Progress Notes (Signed)
   11/12/2017 10:02 AM   Wayna Chalet 1941-01-19 [id number]  Reason for visit: Discuss TURP  HPI: I saw Mr. Rayburn in urology clinic in follow-up today to discuss transurethral resection of the prostate.  Briefly he is a 77 year old male with a prostate volume of 42 g with a small median lobe with significant urinary symptoms, including nocturia 3-4 times per night, weak stream, feeling of incomplete emptying, and elevated PVR of 190 cc.  IPSS score was 17, mostly dissatisfied.  He is on maximal medical therapy with finasteride and terazosin.  He would like to proceed with TURP after doing research over the last few weeks.   ROS: Please see flowsheet from today's date for complete review of systems.  Physical Exam: BP 120/71 (BP Location: Left Arm, Patient Position: Sitting, Cuff Size: Normal)   Pulse 63   Ht 5\' 9"  (1.753 m)   Wt 195 lb 11.2 oz (88.8 kg)   BMI 28.90 kg/m    Assessment & Plan:   In summary, Mr. Fountain is a 77 year old male on Xarelto for atrial fibrillation who desires to proceed with transurethral resection of the prostate for his BPH/LUTS refractory to medical management.  We specifically discussed one night hospital stay, risk of bleeding, infection, temporary urgency and urge incontinence, retrograde ejaculation, and the role of the detrusor and voiding.  Schedule bipolar TURP, will need to be off Xarelto  Greater than 50% of this 15-minute visit was spent in direct patient education and counseling regarding BPH, lower urinary tract symptoms, PSA screening, and TURP.  Billey Co, [facility] [address] [place], [place] [address] [phone]

## 2017-11-17 ENCOUNTER — Other Ambulatory Visit: Payer: Self-pay | Admitting: Family Medicine

## 2017-11-17 ENCOUNTER — Encounter: Payer: Self-pay | Admitting: Family Medicine

## 2017-11-17 MED ORDER — COLCHICINE 0.6 MG PO TABS: 0.6000 mg | ORAL_TABLET | Freq: Every day | ORAL | 0 refills | Status: DC | PRN

## 2017-11-21 ENCOUNTER — Telehealth: Payer: Self-pay | Admitting: Radiology

## 2017-11-21 NOTE — Telephone Encounter (Signed)
LMOM. Need to discuss TURP with Dr Diamantina Providence.

## 2017-11-24 NOTE — Telephone Encounter (Signed)
LMOM

## 2017-11-28 NOTE — Telephone Encounter (Signed)
Spoke with patient regarding scheduling TURP with Dr Diamantina Providence. Patient does not want to schedule at this time & states he will call back if he decides to proceed.

## 2017-12-04 ENCOUNTER — Ambulatory Visit: Payer: PPO | Admitting: Gastroenterology

## 2017-12-04 ENCOUNTER — Encounter: Payer: Self-pay | Admitting: Gastroenterology

## 2017-12-04 ENCOUNTER — Telehealth: Payer: Self-pay

## 2017-12-04 VITALS — BP 120/80 | HR 68 | Ht 69.0 in | Wt 198.4 lb

## 2017-12-04 DIAGNOSIS — Z86010 Personal history of colonic polyps: Secondary | ICD-10-CM

## 2017-12-04 DIAGNOSIS — Z7901 Long term (current) use of anticoagulants: Secondary | ICD-10-CM

## 2017-12-04 DIAGNOSIS — Z860101 Personal history of adenomatous and serrated colon polyps: Secondary | ICD-10-CM

## 2017-12-04 NOTE — Progress Notes (Signed)
History of Present Illness: This is a 77 year old male referred by Tonia Ghent, MD for the evaluation of personal history of precancerous colon polyps.  He is maintained on Xarelto.  Colonoscopy in June 2014 had 6 precancerous polyps under 1 cm.  A 3-year interval colonoscopy was recommended. Occasional mild constipation that has recently improved with improved daily hydration. Denies weight loss, abdominal pain, diarrhea, change in stool caliber, melena, hematochezia, nausea, vomiting, dysphagia, reflux symptoms, chest pain.      No Known Allergies Outpatient Medications Prior to Visit  Medication Sig Dispense Refill  . allopurinol (ZYLOPRIM) 300 MG tablet Take 1 tablet (300 mg total) by mouth daily. 90 tablet 3  . Cholecalciferol (VITAMIN D3 PO) Take 10,000 Units by mouth daily.    Marland Kitchen diltiazem (CARDIZEM CD) 180 MG 24 hr capsule TAKE 1 CAPSULE BY MOUTH ONCE DAILY 30 capsule 11  . finasteride (PROSCAR) 5 MG tablet TAKE 1 TABLET BY MOUTH ONCE DAILY 90 tablet 0  . metoprolol succinate (TOPROL-XL) 50 MG 24 hr tablet TAKE 1 TABLET BY MOUTH DAILY WITH OR IMMEDIATELY FOLLOWING A MEAL 90 tablet 3  . simvastatin (ZOCOR) 10 MG tablet TAKE 1 TABLET BY MOUTH ONCE EVERY EVENING 90 tablet 3  . terazosin (HYTRIN) 10 MG capsule Take 1 capsule (10 mg total) by mouth at bedtime. 90 capsule 3  . traMADol (ULTRAM) 50 MG tablet Take 100 mg by mouth 2 (two) times daily as needed.     Alveda Reasons 20 MG TABS tablet TAKE 1 TABLET BY MOUTH ONCE DAILY 30 tablet 6  . colchicine 0.6 MG tablet Take 1 tablet (0.6 mg total) by mouth daily as needed. 30 tablet 0  . KRILL OIL PO Take 2 capsules by mouth daily.     No facility-administered medications prior to visit.    Past Medical History:  Diagnosis Date  . A-fib ([place]) 2013  . Adenomatous colon polyp 04/2008  . BPH (benign prostatic hypertrophy)   . CAD (coronary artery disease)   . 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  Complication of anesthesia    " I fainted a couple of times when they went to get me up."  . Early cataract   . ED (erectile dysfunction)   . GERD (gastroesophageal reflux disease)   . Hernia, abdominal    small  . History of colon polyps   . Hyperlipidemia   . Hypertension   . Lumbar herniated disc   . Microscopic hematuria   . Right arm weakness 04/17/2015  . Spinal stenosis   . Tobacco abuse    Past Surgical History:  Procedure Laterality Date  . ANTERIOR CERVICAL DECOMP/DISCECTOMY FUSION N/A 06/16/2014   Procedure: ANTERIOR CERVICAL DECOMPRESSION/DISCECTOMY FUSION CERVICAL FIVE-SIX,CERVICAL SIX-SEVEN;  Surgeon: Eustace Moore, MD;  Location: [facility];  Service: Neurosurgery;  Laterality: N/A;  . BACK SURGERY    . COLONOSCOPY  2002  . INGUINAL HERNIA REPAIR    . TONSILLECTOMY     Social History   Socioeconomic History  . Marital status: Married    Spouse name: Not on file  . Number of children: Not on file  . Years of education: Not on file  . Highest education level: Not on file  Occupational History  . Not on file  Social Needs  . Financial resource strain: Not on file  . Food insecurity:    Worry: Not on file    Inability: Not on  file  . Transportation needs:    Medical: Not on file    Non-medical: Not on file  Tobacco Use  . Smoking status: Current Every Day Smoker    Packs/day: 1.00    Years: 60.00    Pack years: 60.00    Types: Cigarettes  . Smokeless tobacco: Never Used  Substance and Sexual Activity  . Alcohol use: No  . Drug use: No  . Sexual activity: Not on file  Lifestyle  . Physical activity:    Days per week: Not on file    Minutes per session: Not on file  . Stress: Not on file  Relationships  . Social connections:    Talks on phone: Not on file    Gets together: Not on file    Attends religious service: Not on file    Active member of club or organization: Not on file    Attends meetings of clubs or organizations: Not on file     Relationship status: Not on file  Other Topics Concern  . Not on file  Social History Narrative   Lives in [place].   Retired from [employer]),  Development worker, international aid   Active with [employer], [place] [employer] [place]      Married (1963, wife [name]) with 5 children    Current Smoker: about 1 pack per day, transition to e-cig   Alcohol use-no    Caffeine use/day:  2 beverages daily   Diet : regular      Moved to [place] in 2005   Family History  Problem Relation Age of Onset  . Diabetes Father   . Coronary artery disease Father   . Stroke Father   . Arthritis Mother   . Hiatal hernia Mother   . Pulmonary embolism Mother   . Colon cancer Neg Hx   . Prostate cancer Neg Hx        Review of Systems: Pertinent positive and negative review of systems were noted in the above HPI section. All other review of systems were otherwise negative.    Physical Exam: General: Well developed, well nourished, no acute distress Head: Normocephalic and atraumatic Eyes:  sclerae anicteric, EOMI Ears: Normal auditory acuity Mouth: No deformity or lesions Neck: Supple, no masses or thyromegaly Lungs: Clear throughout to auscultation Heart: Regular rate and rhythm; no murmurs, rubs or bruits Abdomen: Soft, non tender and non distended. No masses, hepatosplenomegaly or hernias noted. Normal Bowel sounds Rectal: Deferred to colonoscopy Musculoskeletal: Symmetrical with no gross deformities  Skin: No lesions on visible extremities Pulses:  Normal pulses noted Extremities: No clubbing, cyanosis, edema or deformities noted Neurological: Alert oriented x 4, grossly nonfocal Cervical Nodes:  No significant cervical adenopathy Inguinal Nodes: No significant inguinal adenopathy Psychological:  Alert and cooperative. Normal mood and affect   Assessment and Recommendations:  1.  Personal history of precancerous colon polyps.  Overdue for 3-year interval surveillance  colonoscopy.  Schedule colonoscopy. The risks (including bleeding, perforation, infection, missed lesions, medication reactions and possible hospitalization or surgery if complications occur), benefits, and alternatives to colonoscopy with possible biopsy and possible polypectomy were discussed with the patient and they consent to proceed.   2.  Mild constipation.  Maintain adequate hydration.  Increase daily fiber intake.  If these measures are not effective then a trial of daily Colace.  3.  Atrial fibrillation maintained on Xarelto.  Hold Xarelto 2 days before procedure - will instruct when and how to resume after procedure.  Low but real risk of cardiovascular event such as heart attack, stroke, embolism, thrombosis or ischemia/infarct of other organs off Xarelto explained and need to seek urgent help if this occurs. The patient consents to proceed. Will communicate by phone or EMR with patient's prescribing provider to confirm that holding Xarelto is reasonable in this case.     cc: Tonia Ghent, MD [address] [place], [place] [address]

## 2017-12-04 NOTE — Telephone Encounter (Signed)
[facility] Pre-operative Risk Assessment     Request for surgical clearance:     Endoscopy Procedure  What type of surgery is being performed?     Colonoscopy  When is this surgery scheduled?    Will be scheduled for December when schedule is available  What type of clearance is required ?   Pharmacy  Are there any medications that need to be held prior to surgery and how long? Xareltox 2 days  Practice name and name of physician performing surgery?      [facility]  What is your office phone and fax number?      Phone- [phone]  [phone]  Anesthesia type (None, local, MAC, general) ?       MAC

## 2017-12-04 NOTE — Patient Instructions (Signed)
We will contact you in a couple weeks when the December schedule comes out to schedule your EGD/Colonoscopy. We will in the mean time contact your Cardiologist to get your Xarelto clearance.   Thank you for choosing me and [facility].  Pricilla Riffle. Dagoberto Ligas., MD., Marval Regal

## 2017-12-04 NOTE — Telephone Encounter (Signed)
Patient with diagnosis of Afib on Xarelto for anticoagulation.    Procedure: colonoscopy Date of procedure: TBD   CHADS2-VASc score of  4 (CHF, HTN, AGE, DM2, stroke/tia x 2, CAD, AGE, male)  CrCl 63ml/min  Per office protocol, patient can hold Xarelto for 1-2 days prior to procedure.

## 2017-12-05 NOTE — Telephone Encounter (Signed)
Left a message for patient to return my call. 

## 2017-12-08 NOTE — Telephone Encounter (Signed)
Informed patient that when he decides to schedule for December he can hold Xarelto 48 hours prior to his procedure. Patient states he will call back at the end of this month after his wifes heart surgery to schedule.

## 2018-01-09 ENCOUNTER — Other Ambulatory Visit: Payer: Self-pay | Admitting: Family Medicine

## 2018-02-18 HISTORY — PX: SPINAL CORD STIMULATOR IMPLANT: SHX2422

## 2018-03-11 ENCOUNTER — Other Ambulatory Visit: Payer: Self-pay | Admitting: Cardiovascular Disease

## 2018-03-11 NOTE — Telephone Encounter (Signed)
Please review for refill, Thanks !  

## 2018-04-10 DIAGNOSIS — I10 Essential (primary) hypertension: Secondary | ICD-10-CM | POA: Diagnosis not present

## 2018-04-10 DIAGNOSIS — Z6828 Body mass index (BMI) 28.0-28.9, adult: Secondary | ICD-10-CM | POA: Diagnosis not present

## 2018-04-10 DIAGNOSIS — M961 Postlaminectomy syndrome, not elsewhere classified: Secondary | ICD-10-CM | POA: Diagnosis not present

## 2018-04-11 ENCOUNTER — Emergency Department: Payer: PPO

## 2018-04-11 ENCOUNTER — Emergency Department
Admission: EM | Admit: 2018-04-11 | Discharge: 2018-04-11 | Disposition: A | Discharge status: Home / Self Care | Payer: PPO | Attending: Student in an Organized Health Care Education/Training Program | Admitting: Student in an Organized Health Care Education/Training Program

## 2018-04-11 ENCOUNTER — Encounter: Payer: Self-pay | Admitting: Emergency Medicine

## 2018-04-11 DIAGNOSIS — F17210 Nicotine dependence, cigarettes, uncomplicated: Secondary | ICD-10-CM | POA: Diagnosis not present

## 2018-04-11 DIAGNOSIS — I10 Essential (primary) hypertension: Secondary | ICD-10-CM | POA: Insufficient documentation

## 2018-04-11 DIAGNOSIS — Z79899 Other long term (current) drug therapy: Secondary | ICD-10-CM | POA: Diagnosis not present

## 2018-04-11 DIAGNOSIS — J449 Chronic obstructive pulmonary disease, unspecified: Secondary | ICD-10-CM | POA: Insufficient documentation

## 2018-04-11 DIAGNOSIS — Z7901 Long term (current) use of anticoagulants: Secondary | ICD-10-CM | POA: Insufficient documentation

## 2018-04-11 DIAGNOSIS — I480 Paroxysmal atrial fibrillation: Secondary | ICD-10-CM | POA: Insufficient documentation

## 2018-04-11 DIAGNOSIS — Z85828 Personal history of other malignant neoplasm of skin: Secondary | ICD-10-CM | POA: Diagnosis not present

## 2018-04-11 DIAGNOSIS — R002 Palpitations: Secondary | ICD-10-CM | POA: Diagnosis present

## 2018-04-11 DIAGNOSIS — I4891 Unspecified atrial fibrillation: Secondary | ICD-10-CM | POA: Diagnosis not present

## 2018-04-11 DIAGNOSIS — I2510 Atherosclerotic heart disease of native coronary artery without angina pectoris: Secondary | ICD-10-CM | POA: Diagnosis not present

## 2018-04-11 LAB — COMPREHENSIVE METABOLIC PANEL
ALT: 32 U/L (ref 0–44)
AST: 32 U/L (ref 15–41)
Albumin: 4 g/dL (ref 3.5–5.0)
Alkaline Phosphatase: 88 U/L (ref 38–126)
Anion gap: 10 (ref 5–15)
BUN: 13 mg/dL (ref 8–23)
CO2: 23 mmol/L (ref 22–32)
Calcium: 9.7 mg/dL (ref 8.9–10.3)
Chloride: 107 mmol/L (ref 98–111)
Creatinine, Ser: 1.12 mg/dL (ref 0.61–1.24)
GFR calc Af Amer: >60 mL/min (ref >60)
GFR calc non Af Amer: >60 mL/min (ref >60)
Glucose, Bld: 118 mg/dL — ABNORMAL HIGH (ref 70–99)
Potassium: 4.2 mmol/L (ref 3.5–5.1)
Sodium: 140 mmol/L (ref 135–145)
Total Bilirubin: 1.2 mg/dL (ref 0.3–1.2)
Total Protein: 7.5 g/dL (ref 6.5–8.1)

## 2018-04-11 LAB — CBC WITH DIFFERENTIAL/PLATELET
Abs Immature Granulocytes: 0.03 10*3/uL (ref 0.00–0.07)
Basophils Absolute: 0.1 10*3/uL (ref 0.0–0.1)
Basophils Relative: 1 %
EOS ABS: 0.1 10*3/uL (ref 0.0–0.5)
Eosinophils Relative: 1 %
HCT: 49.7 % (ref 39.0–52.0)
Hemoglobin: 16.8 g/dL (ref 13.0–17.0)
Immature Granulocytes: 0 %
Lymphocytes Relative: 44 %
Lymphs Abs: 5.7 10*3/uL — ABNORMAL HIGH (ref 0.7–4.0)
MCH: 30.3 pg (ref 26.0–34.0)
MCHC: 33.8 g/dL (ref 30.0–36.0)
MCV: 89.7 fL (ref 80.0–100.0)
Monocytes Absolute: 0.7 10*3/uL (ref 0.1–1.0)
Monocytes Relative: 5 %
Neutro Abs: 6.4 10*3/uL (ref 1.7–7.7)
Neutrophils Relative %: 49 %
Platelets: 240 10*3/uL (ref 150–400)
RBC: 5.54 MIL/uL (ref 4.22–5.81)
RDW: 14.4 % (ref 11.5–15.5)
WBC: 12.9 10*3/uL — ABNORMAL HIGH (ref 4.0–10.5)
nRBC: 0 % (ref 0.0–0.2)

## 2018-04-11 LAB — CBC
HCT: 50.2 % (ref 39.0–52.0)
Hemoglobin: 16.9 g/dL (ref 13.0–17.0)
MCH: 30.6 pg (ref 26.0–34.0)
MCHC: 33.7 g/dL (ref 30.0–36.0)
MCV: 90.8 fL (ref 80.0–100.0)
PLATELETS: 246 10*3/uL (ref 150–400)
RBC: 5.53 MIL/uL (ref 4.22–5.81)
RDW: 14.3 % (ref 11.5–15.5)
WBC: 13.1 10*3/uL — ABNORMAL HIGH (ref 4.0–10.5)
nRBC: 0 % (ref 0.0–0.2)

## 2018-04-11 LAB — URINALYSIS, COMPLETE (UACMP) WITH MICROSCOPIC
Bacteria, UA: NONE SEEN
Bilirubin Urine: NEGATIVE
Glucose, UA: NEGATIVE mg/dL
Ketones, ur: NEGATIVE mg/dL
Leukocytes,Ua: NEGATIVE
Nitrite: NEGATIVE
Protein, ur: NEGATIVE mg/dL
Specific Gravity, Urine: 1.004 — ABNORMAL LOW (ref 1.005–1.030)
Squamous Epithelial / HPF: NONE SEEN (ref 0–5)
pH: 7 (ref 5.0–8.0)

## 2018-04-11 LAB — MAGNESIUM: MAGNESIUM: 2.2 mg/dL (ref 1.7–2.4)

## 2018-04-11 MED ORDER — RIVAROXABAN 20 MG PO TABS
20.0000 mg | ORAL_TABLET | Freq: Once | ORAL | Status: DC
  Filled 2018-04-11 (×3): qty 1

## 2018-04-11 MED ORDER — RIVAROXABAN 2.5 MG PO TABS
5.0000 mg | ORAL_TABLET | Freq: Once | ORAL | Status: AC
  Administered 2018-04-11: 5 mg via ORAL
  Filled 2018-04-11 (×2): qty 2

## 2018-04-11 MED ORDER — SODIUM CHLORIDE 0.9 % IV BOLUS
500.0000 mL | Freq: Once | INTRAVENOUS | Status: AC
  Administered 2018-04-11: 500 mL via INTRAVENOUS

## 2018-04-11 MED ORDER — RIVAROXABAN 20 MG PO TABS: 40.0000 mg | ORAL_TABLET | Freq: Every day | ORAL | Status: DC

## 2018-04-11 MED ORDER — RIVAROXABAN 15 MG PO TABS
15.0000 mg | ORAL_TABLET | Freq: Once | ORAL | Status: AC
  Administered 2018-04-11: 15 mg via ORAL
  Filled 2018-04-11: qty 1

## 2018-04-11 NOTE — ED Notes (Signed)
Pt given food tray after returning from xr.

## 2018-04-11 NOTE — ED Notes (Signed)
Pt ambulated to toilet with no assistance and gave urine sample which was sent.

## 2018-04-11 NOTE — ED Provider Notes (Signed)
[facility] Emergency Department Provider Note    First MD Initiated Contact with Patient 04/11/18 1356     (approximate)  I have reviewed the triage vital signs and the nursing notes.   HISTORY  Chief Complaint Tachycardia    HPI Greg Hughes is a 78 y.o. male with a history of intermittent A. fib on Xarelto presents the ER for palpitations that started this morning.  States he has dealt with the symptoms several times before.  Denies any pain or pressure just feels that his heart is racing and skipping beats.  When the symptoms happen he typically takes an extra metoprolol which he did around 10 AM.  He waited another 2 hours and was still having palpitations so he took another dose.  The symptoms persisted until 1 or 2:00 and at that point he decided to bring himself to the ER for further evaluation.  While being checked in triage the patient converted to a sinus rhythm.  Heart rate of 80s.    Past Medical History:  Diagnosis Date  . A-fib ([place]) 2013  . Adenomatous colon polyp 04/2008  . BPH (benign prostatic hypertrophy)   . CAD (coronary artery disease)   . 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 Complication of anesthesia    " I fainted a couple of times when they went to get me up."  . Early cataract   . ED (erectile dysfunction)   . GERD (gastroesophageal reflux disease)   . Hernia, abdominal    small  . History of colon polyps   . Hyperlipidemia   . Hypertension   . Lumbar herniated disc   . Microscopic hematuria   . Right arm weakness 04/17/2015  . Spinal stenosis   . Tobacco abuse    Family History  Problem Relation Age of Onset  . Diabetes Father   . Coronary artery disease Father   . Stroke Father   . Arthritis Mother   . Hiatal hernia Mother   . Pulmonary embolism Mother   . Colon cancer Neg Hx   . Prostate cancer Neg Hx    Past Surgical History:  Procedure Laterality Date  . ANTERIOR  CERVICAL DECOMP/DISCECTOMY FUSION N/A 06/16/2014   Procedure: ANTERIOR CERVICAL DECOMPRESSION/DISCECTOMY FUSION CERVICAL FIVE-SIX,CERVICAL SIX-SEVEN;  Surgeon: Eustace Moore, MD;  Location: [facility];  Service: Neurosurgery;  Laterality: N/A;  . BACK SURGERY    . COLONOSCOPY  2002  . INGUINAL HERNIA REPAIR    . TONSILLECTOMY     Patient Active Problem List   Diagnosis Date Noted  . Health care maintenance 10/03/2017  . PSA elevation 10/03/2017  . History of melanoma   . Advance care planning 06/08/2017  . Benign fibroma of prostate 04/17/2015  . Encounter for anticoagulation discussion and counseling 03/09/2015  . S/P cervical spinal fusion 06/16/2014  . Aortic atherosclerosis ([place]) 05/16/2014  . Adrenal adenoma 04/21/2014  . Cystic disease of kidney 03/03/2014  . Spinal stenosis of lumbar region without neurogenic claudication 10/07/2013  . Lung nodules 01/05/2013  . Medicare annual wellness visit, subsequent 09/30/2012  . Gout 09/30/2012  . Atrial fibrillation ([place]) 11/18/2011  . COPD 04/30/2010  . ERECTILE DYSFUNCTION 10/20/2007  . Benign prostatic hyperplasia with urinary obstruction 08/09/2007  . TOBACCO ABUSE 02/10/2007  . Microscopic hematuria 02/10/2007  . BACK PAIN, LUMBAR, CHRONIC 02/10/2007  . Mixed hyperlipidemia 02/05/2007  . Essential hypertension 02/05/2007      Prior  to Admission medications   Medication Sig Start Date End Date Taking? Authorizing Provider  allopurinol (ZYLOPRIM) 300 MG tablet Take 1 tablet (300 mg total) by mouth daily. 10/01/17   Tonia Ghent, MD  Cholecalciferol (VITAMIN D3 PO) Take 10,000 Units by mouth daily.    [provider]  diltiazem (CARDIZEM CD) 180 MG 24 hr capsule TAKE 1 CAPSULE BY MOUTH ONCE DAILY 10/17/17   Minna Merritts, MD  finasteride (PROSCAR) 5 MG tablet TAKE 1 TABLET BY MOUTH ONCE A DAY 01/09/18   Tonia Ghent, MD  metoprolol succinate (TOPROL-XL) 50 MG 24 hr tablet TAKE 1 TABLET BY MOUTH DAILY WITH  OR IMMEDIATELY FOLLOWING A MEAL 10/23/17   Gollan, Kathlene November, MD  simvastatin (ZOCOR) 10 MG tablet TAKE 1 TABLET BY MOUTH ONCE EVERY EVENING 10/01/17   Tonia Ghent, MD  terazosin (HYTRIN) 10 MG capsule Take 1 capsule (10 mg total) by mouth at bedtime. 10/01/17   Tonia Ghent, MD  traMADol (ULTRAM) 50 MG tablet Take 100 mg by mouth 2 (two) times daily as needed.  02/09/15   [provider]  XARELTO 20 MG TABS tablet TAKE 1 TABLET BY MOUTH ONCE A DAY 03/11/18   Minna Merritts, MD    Allergies Patient has no known allergies.    Social History Social History   Tobacco Use  . Smoking status: Current Every Day Smoker    Packs/day: 1.00    Years: 60.00    Pack years: 60.00    Types: Cigarettes  . Smokeless tobacco: Never Used  Substance Use Topics  . Alcohol use: No  . Drug use: No    Review of Systems Patient denies headaches, rhinorrhea, blurry vision, numbness, shortness of breath, chest pain, edema, cough, abdominal pain, nausea, vomiting, diarrhea, dysuria, fevers, rashes or hallucinations unless otherwise stated above in HPI. ____________________________________________   PHYSICAL EXAM:  VITAL SIGNS: Vitals:   04/11/18 1357  BP: (!) 151/95  Pulse: (!) 132  Resp: 20  Temp: (!) 96.9 F (36.1 C)  SpO2: 100%    Constitutional: Alert and oriented.  Eyes: Conjunctivae are normal.  Head: Atraumatic. Nose: No congestion/rhinnorhea. Mouth/Throat: Mucous membranes are dry.   Neck: No stridor. Painless ROM.  Cardiovascular: Normal rate, regular rhythm. Grossly normal heart sounds.  Good peripheral circulation. Respiratory: Normal respiratory effort.  No retractions. Lungs CTAB. Gastrointestinal: Soft and nontender. No distention. No abdominal bruits. No CVA tenderness. Genitourinary:  Musculoskeletal: No lower extremity tenderness nor edema.  No joint effusions. Neurologic:  Normal speech and language. No gross focal neurologic deficits are appreciated. No  facial droop Skin:  Skin is warm, dry and intact. No rash noted. Psychiatric: Mood and affect are normal. Speech and behavior are normal.  ____________________________________________   LABS (all labs ordered are listed, but only abnormal results are displayed)  Results for orders placed or performed during the hospital encounter of 04/11/18 (from the past 24 hour(s))  CBC     Status: Abnormal   Collection Time: 04/11/18  2:10 PM  Result Value Ref Range   WBC 13.1 (H) 4.0 - 10.5 K/uL   RBC 5.53 4.22 - 5.81 MIL/uL   Hemoglobin 16.9 13.0 - 17.0 g/dL   HCT 50.2 39.0 - 52.0 %   MCV 90.8 80.0 - 100.0 fL   MCH 30.6 26.0 - 34.0 pg   MCHC 33.7 30.0 - 36.0 g/dL   RDW 14.3 11.5 - 15.5 %   Platelets 246 150 - 400 K/uL  nRBC 0.0 0.0 - 0.2 %   ____________________________________________  EKG My review and personal interpretation at Time: 13:59   Indication: palpiations  Rate: 130  Rhythm: afib w/ rvr Axis: normal Other: nonspecific st abn, likely rate dependent changes   My review and personal interpretation at Time: 14:07   Indication: palpitations  Rate: 90  Rhythm: sinus Axis: normal Other: normal intervals, occasional pvc  ____________________________________________  RADIOLOGY  I personally reviewed all radiographic images ordered to evaluate for the above acute complaints and reviewed radiology reports and findings.  These findings were personally discussed with the patient.  Please see medical record for radiology report.  ____________________________________________   PROCEDURES  Procedure(s) performed:  Procedures    Critical Care performed: no ____________________________________________   INITIAL IMPRESSION / ASSESSMENT AND PLAN / ED COURSE  Pertinent labs & imaging results that were available during my care of the patient were reviewed by me and considered in my medical decision making (see chart for details).   DDX: Dehydration, electrolyte abnormality,  paroxysmal A. fib, anemia  Greg Hughes is a 78 y.o. who presents to the ED with symptoms as described above.  Arrives afebrile initially tachycardic with rapid A. fib which self converted into sinus rhythm.  Patient asymptomatic at this time.  Does appear dehydrated therefore will give bolus of IV fluids check blood work and reassess.      As part of my medical decision making, I reviewed the following data within the [place] notes reviewed and incorporated, Labs reviewed, notes from prior ED visits and [place] Controlled Substance Database   ____________________________________________   FINAL CLINICAL IMPRESSION(S) / ED DIAGNOSES  Final diagnoses:  Paroxysmal A-fib ([place])  Atrial fibrillation with RVR ([place])      NEW MEDICATIONS STARTED DURING THIS VISIT:  New Prescriptions   No medications on file     Note:  This document was prepared using Dragon voice recognition software and may include unintentional dictation errors.    Merlyn Lot, MD 04/11/18 [phone]

## 2018-04-11 NOTE — ED Triage Notes (Signed)
Pt states feels like his afib is fast. Took an extra metoprolol approx 45 mins ago and drove himself to er.  States feels weak

## 2018-04-11 NOTE — ED Triage Notes (Signed)
While being triaged pt converted so a 2nd ekg obtained. Both given to dr.

## 2018-04-11 NOTE — ED Notes (Signed)
Upon conducting home medication interview, patient revealed he had planned on going to pick up medications at pharmacy today, but will not be able to now as the pharmacy is closed.Currently out of medications at home. If discharged, patient requests dose of Xarelto for tonight (2/22) and tomorrow (2/23) as pharmacy will not be open Sunday. Patient also requests 2 tablets of Ultram to get through the weekend as he has not picked up that RX either.   ** The above is intended solely for informational and/or communicative purposes. It should in no way be considered an endorsement of any specific treatment, therapy or action. **

## 2018-04-11 NOTE — ED Provider Notes (Signed)
-----------------------------------------   5:45 PM on 04/11/2018 ----------------------------------------- During a period of observation in the ED, no recurrent palpitations or dysrhythmia.  Patient remains asymptomatic.  Urinalysis unremarkable.  Patient reports to pharmacy tech that he is out of Xarelto and will be unable to refill it until Monday since the pharmacy is closed.  I will attempt to obtain from pharmacy 2 doses of Xarelto for him to take tonight and tomorrow until he can go to his pharmacy.  He also requests Ultram which we will be unable to provide.  Stable for discharge at this time, follow-up with cardiology in 2 days.   Carrie Mew, MD 04/11/18 [phone]

## 2018-04-16 DIAGNOSIS — H2513 Age-related nuclear cataract, bilateral: Secondary | ICD-10-CM | POA: Diagnosis not present

## 2018-04-20 ENCOUNTER — Ambulatory Visit (INDEPENDENT_AMBULATORY_CARE_PROVIDER_SITE_OTHER): Payer: PPO | Admitting: Cardiovascular Disease

## 2018-04-20 ENCOUNTER — Encounter: Payer: Self-pay | Admitting: Cardiovascular Disease

## 2018-04-20 VITALS — BP 130/78 | HR 51 | Ht 69.0 in | Wt 197.0 lb

## 2018-04-20 DIAGNOSIS — I480 Paroxysmal atrial fibrillation: Secondary | ICD-10-CM | POA: Diagnosis not present

## 2018-04-20 DIAGNOSIS — I700 Atherosclerosis of aorta: Secondary | ICD-10-CM | POA: Diagnosis not present

## 2018-04-20 MED ORDER — DILTIAZEM HCL 30 MG PO TABS: ORAL_TABLET | ORAL | 0 refills | Status: DC

## 2018-04-20 NOTE — Patient Instructions (Addendum)
Medication Instructions:  Please take diltiazem 30 mg 1 tablet by mouth three times a day as needed for atrial fib break through  If you need a refill on your cardiac medications before your next appointment, please call your pharmacy.    Lab work: No new labs needed   If you have labs (blood work) drawn today and your tests are completely normal, you will receive your results only by: Marland Kitchen MyChart Message (if you have MyChart) OR . A paper copy in the mail If you have any lab test that is abnormal or we need to change your treatment, we will call you to review the results.   Testing/Procedures: No new testing needed   Follow-Up: At [facility], you and your health needs are our priority.  As part of our continuing mission to provide you with exceptional heart care, we have created designated Provider Care Teams.  These Care Teams include your primary Cardiologist (physician) and Advanced Practice Providers (APPs -  Physician Assistants and Nurse Practitioners) who all work together to provide you with the care you need, when you need it.  . You will need a follow up appointment in 12 months .   Please call our office 2 months in advance to schedule this appointment.    . Providers on your designated Care Team:   . Murray Hodgkins, NP . Christell Faith, PA-C . Marrianne Mood, PA-C  Any Other Special Instructions Will Be Listed Below (If Applicable).  For educational health videos Log in to : [email or web address] Or : [email or web address], password : [email or web address]

## 2018-04-20 NOTE — Progress Notes (Signed)
Patient ID: Greg Hughes, male   DOB: 1941-01-21, 78 y.o.   MRN: [id number] Cardiology Office Note  Date:  04/20/2018   ID:  Greg Hughes 1940/06/25, MRN [id number]  PCP:  Greg Ghent, MD   Chief Complaint  Patient presents with  . other    ED F/U for A Fib. Meds reviewed verbally with patient.    HPI:  Greg Hughes is a very pleasant 78 y.o. 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   INTERVAL HISTORY: The patient reports today for follow up. He is doing well overall.   Went to the hospital on 04/11/18 for Afib.    took a metoprolol that morning, but later that day at 11:00 am he took a 2nd tablet. His heart rate had gone up to 170, but as soon as he got to the hospital he felt it drop. The Afib converted on its own to normal sinus rhythm in the emergency room without intervention.  he does not know the precipitating cause of the Afib.  No recent stressors or illness . endorses that he rarely gets it. Usually takes a metoprolol as soon as he feels it. He felt that he was in distress during the last episode and it was the worst episode he has had.  He is still thinking about getting a spinal cord stimulator for his chronic back pain.   Has concerns about being unable to get Xarelto due to high co-pays because of the donut hole.   He is still a current smoker. Difficulty stopping  Blood pressure 130/78 Total Chol 152/ LDL 79 CR 1.12 Glucose 110  EKG personally reviewed by myself on todays visit Shows sinus bradycardia rhythm. 51 bpm. Otherwise normal ECG   OTHER PAST MEDICAL HISTORY REVIEWED BY ME FOR TODAY'S VISIT: He presented to the hospital in 2013, [place] with palpitations and shortness of breath. found to be in atrial fibrillation with RVR, rate 150 beats per minute. He was given IV Cardizem in  the emergency room and he converted to normal sinus rhythm during his hospital course. He was discharged on Cardizem 120 mg daily.  Previously reported having rare episodes of atrial fibrillation lasting 20 minutes, possibly 4 episodes over the past year  Echocardiogram 11/08/2011 shows normal systolic function, normal left atrial size, otherwise normal study  PMH:   has a past medical history of A-fib ([place]) (2013), Adenomatous colon polyp (04/2008), BPH (benign prostatic hypertrophy), CAD (coronary artery disease), Cancer ([place]), Complication of anesthesia, Early cataract, ED (erectile dysfunction), GERD (gastroesophageal reflux disease), Hernia, abdominal, History of colon polyps, Hyperlipidemia, Hypertension, Lumbar herniated disc, Microscopic hematuria, Right arm weakness (04/17/2015), Spinal stenosis, and Tobacco abuse.  PSH:    Past Surgical History:  Procedure Laterality Date  . ANTERIOR CERVICAL DECOMP/DISCECTOMY FUSION N/A 06/16/2014   Procedure: ANTERIOR CERVICAL DECOMPRESSION/DISCECTOMY FUSION CERVICAL FIVE-SIX,CERVICAL SIX-SEVEN;  Surgeon: Greg Moore, MD;  Location: [facility];  Service: Neurosurgery;  Laterality: N/A;  . BACK SURGERY    . COLONOSCOPY  2002  . INGUINAL HERNIA REPAIR    . TONSILLECTOMY      Current Outpatient Medications  Medication Sig Dispense Refill  . allopurinol (ZYLOPRIM) 300 MG tablet Take 1 tablet (300 mg total) by mouth daily. 90 tablet 3  . Cholecalciferol (VITAMIN D3) 125 MCG (5000 UT) CAPS Take 10,000  Units by mouth daily.     Marland Kitchen diltiazem (CARDIZEM CD) 180 MG 24 hr capsule TAKE 1 CAPSULE BY MOUTH ONCE DAILY (Patient taking differently: Take 180 mg by mouth at bedtime. ) 30 capsule 11  . finasteride (PROSCAR) 5 MG tablet TAKE 1 TABLET BY MOUTH ONCE A DAY (Patient taking differently: Take 5 mg by mouth at bedtime. ) 90 tablet 1  . HYDROcodone-acetaminophen (NORCO/VICODIN) 5-325 MG tablet Take 1 tablet by mouth 3 (three) times daily as needed for  moderate pain or severe pain.    . metoprolol succinate (TOPROL-XL) 50 MG 24 hr tablet TAKE 1 TABLET BY MOUTH DAILY WITH OR IMMEDIATELY FOLLOWING A MEAL (Patient taking differently: Take 50 mg by mouth at bedtime. ) 90 tablet 3  . simvastatin (ZOCOR) 10 MG tablet TAKE 1 TABLET BY MOUTH ONCE EVERY EVENING (Patient taking differently: Take 10 mg by mouth at bedtime. ) 90 tablet 3  . terazosin (HYTRIN) 10 MG capsule Take 1 capsule (10 mg total) by mouth at bedtime. 90 capsule 3  . traMADol (ULTRAM) 50 MG tablet Take 100 mg by mouth 2 (two) times daily as needed.     Alveda Reasons 20 MG TABS tablet TAKE 1 TABLET BY MOUTH ONCE A DAY (Patient taking differently: Take 20 mg by mouth at bedtime. ) 30 tablet 6   No current facility-administered medications for this visit.     Allergies:   Patient has no known allergies.   Social History:  The patient  reports that he has been smoking cigarettes. He has a 60.00 pack-year smoking history. He has never used smokeless tobacco. He reports that he does not drink alcohol or use drugs.   Family History:   family history includes Arthritis in his mother; Coronary artery disease in his father; Diabetes in his father; Hiatal hernia in his mother; Pulmonary embolism in his mother; Stroke in his father.    Review of Systems: Review of Systems  Constitutional: Negative.   Eyes: Negative.   Respiratory: Negative.   Gastrointestinal: Negative.   Genitourinary: Negative.   Musculoskeletal: Positive for back pain.  Neurological: Negative.   Psychiatric/Behavioral: Negative.   All other systems reviewed and are negative.    PHYSICAL EXAM: VS:  BP 130/78 (BP Location: Left Arm, Patient Position: Sitting, Cuff Size: Normal)   Pulse (!) 51   Ht 5\' 9"  (1.753 m)   Wt 197 lb (89.4 kg)   SpO2 98%   BMI 29.09 kg/m  , BMI Body mass index is 29.09 kg/m.  Constitutional:  oriented to person, place, and time. No distress.  HENT:  Head: Grossly normal Eyes:  no  discharge. No scleral icterus.  Neck: No JVD, no carotid bruits  Cardiovascular: Regular rate and rhythm, no murmurs appreciated  Pulmonary/Chest: Clear to auscultation bilaterally, no wheezes or rales Abdominal: Soft.  no distension.  no tenderness.  Musculoskeletal: Normal range of motion Neurological:  normal muscle tone. Coordination normal. No atrophy Skin: Skin warm and dry Psychiatric: normal affect, pleasant   Recent Labs: Lab work as above   Lipid Panel Lab Results  Component Value Date   CHOL 152 09/26/2017   HDL 46.60 09/26/2017   LDLCALC 79 09/26/2017   TRIG 134.0 09/26/2017      Wt Readings from Last 3 Encounters:  04/20/18 197 lb (89.4 kg)  12/04/17 198 lb 6.4 oz (90 kg)  11/12/17 195 lb 11.2 oz (88.8 kg)      ASSESSMENT AND PLAN:  Atrial fibrillation, unspecified type (  [place])  Plan: EKG 12-Lead Recommend taking diltiazem 30 mg PRN 1 to 2 pills for Afib break through Rare atrial fib episodes Tolerating anticoagulation Asymptomatic bradycardia on today's visit rate 51 bpm Suggested we watch this closely  Essential hypertension Plan: We'll continue current medications diltiazem and metoprolol Stable   Hyperlipidemia Plan: Cholesterol is slightly above goal on the current lipid regimen. No changes to the medications were made.  Aortic atherosclerosis (HCC) Plan: Cholesterol at goal, recommended smoking cessation On a statin though low-dose  TOBACCO ABUSE Plan: He does not want Chantix Previously tried vapor cigarettes   Disposition:   F/U  12 months   No orders of the defined types were placed in this encounter.  Total encounter time more than 25 minutes Greater than 50% was spent in counseling and coordination of care with the patient  I, Jesus Reyes am acting as a scribe for Ida Rogue, M.D., Ph.D.  I, Ida Rogue, M.D. Ph.D., have reviewed the above documentation for accuracy and completeness, and I agree with the above.     Signed, Esmond Plants, M.D., Ph.D. 04/20/2018  [place], [place]

## 2018-05-05 ENCOUNTER — Ambulatory Visit: Payer: PPO | Admitting: Cardiovascular Disease

## 2018-05-11 ENCOUNTER — Other Ambulatory Visit: Payer: Self-pay | Admitting: Cardiovascular Disease

## 2018-07-22 DIAGNOSIS — M5416 Radiculopathy, lumbar region: Secondary | ICD-10-CM | POA: Diagnosis not present

## 2018-07-22 DIAGNOSIS — M5136 Other intervertebral disc degeneration, lumbar region: Secondary | ICD-10-CM | POA: Diagnosis not present

## 2018-07-22 DIAGNOSIS — G894 Chronic pain syndrome: Secondary | ICD-10-CM | POA: Diagnosis not present

## 2018-07-25 ENCOUNTER — Other Ambulatory Visit: Payer: Self-pay | Admitting: Family Medicine

## 2018-07-28 ENCOUNTER — Telehealth: Payer: Self-pay | Admitting: Cardiovascular Disease

## 2018-07-28 NOTE — Telephone Encounter (Signed)
° °  [facility] Pre-operative Risk Assessment    Request for surgical clearance:  1. What type of surgery is being performed? Spinal Cord Stimulator trial    2. When is this surgery scheduled? TBD   3. What type of clearance is required (medical clearance vs. Pharmacy clearance to hold med vs. Both)? Pharmacy   4. Are there any medications that need to be held prior to surgery and how long? Stop Xarelto 3 days prior and then 7 days during the trial. Total of 10 days    5. Practice name and name of physician performing surgery? [facility] [place], Dr Tyler Pita  6. What is your office phone number [phone]    7.   What is your office fax number [phone]  8.   Anesthesia type (None, local, MAC, general) ? None listed    Greg Hughes 07/28/2018, 1:24 PM  _________________________________________________________________   (provider comments below)

## 2018-07-28 NOTE — Telephone Encounter (Signed)
Patient with diagnosis of afib on Xarelto for anticoagulation.    Procedure: Spinal Cord Stimulator trial   Date of procedure: TBD  CHADS2-VASc score of  4 (CHF, HTN, AGE, DM2, stroke/tia x 2, CAD, AGE, male)  CrCl 61ml/min  Due to the prolonged hold time requested (total of 10 days) I will route to MD for input.

## 2018-07-29 NOTE — Telephone Encounter (Signed)
Should be acceptable risk to hold Xarelto for 10 days He has rare episodes of atrial fibrillation Would recommend he stay on his metoprolol, especially through that time to minimize arrhythmia

## 2018-07-30 NOTE — Telephone Encounter (Signed)
   Primary Cardiologist: Dr. Rockey Situ   Chart reviewed as part of pre-operative protocol coverage. Given past medical history and time since last visit, based on ACC/AHA guidelines, JANIS CUFFE would be at acceptable risk for the planned procedure without further cardiovascular testing.   Per Dr.Gollan, Should be acceptable risk to hold Xarelto for 10 days He has rare episodes of atrial fibrillation Would recommend he stay on his metoprolol, especially through that time to minimize arrhythmia.  I will route this recommendation to the requesting party via Epic fax function and remove from pre-op pool.  Please call with questions.  Lyda Jester, PA-C 07/30/2018, 3:27 PM

## 2018-08-14 DIAGNOSIS — Z1159 Encounter for screening for other viral diseases: Secondary | ICD-10-CM | POA: Diagnosis not present

## 2018-08-14 DIAGNOSIS — G894 Chronic pain syndrome: Secondary | ICD-10-CM | POA: Diagnosis not present

## 2018-08-14 DIAGNOSIS — Z01818 Encounter for other preprocedural examination: Secondary | ICD-10-CM | POA: Diagnosis not present

## 2018-08-17 DIAGNOSIS — M4326 Fusion of spine, lumbar region: Secondary | ICD-10-CM | POA: Diagnosis not present

## 2018-08-17 DIAGNOSIS — M5416 Radiculopathy, lumbar region: Secondary | ICD-10-CM | POA: Diagnosis not present

## 2018-08-17 DIAGNOSIS — G894 Chronic pain syndrome: Secondary | ICD-10-CM | POA: Diagnosis not present

## 2018-08-21 NOTE — Progress Notes (Signed)
PCP notes:   Health maintenance:  Colonoscopy - to be determined  Abnormal screenings:   None  Patient concerns:   None  Nurse concerns:  None  Next PCP appt:   10/01/2017  I reviewed health advisor's note, was available for consultation on the day of service listed in this note, and agree with documentation and plan. Elsie Stain, MD.

## 2018-08-21 NOTE — Progress Notes (Signed)
Subjective:   Greg Hughes is a 78 y.o. male who presents for Medicare Annual/Subsequent preventive examination.  Review of Systems:  N/A Cardiac Risk Factors include: advanced age (>90men, >37 women);male gender     Objective:    Vitals: BP 130/80 (BP Location: Right Arm, Patient Position: Sitting, Cuff Size: Normal)   Pulse (!) 53   Temp (!) 97.5 F (36.4 C) (Oral)   Ht 5' 9.5" (1.765 m) Comment: no shoes  Wt 194 lb (88 kg)   SpO2 95%   BMI 28.24 kg/m   Body mass index is 28.24 kg/m.  Advanced Directives 04/11/2018 09/26/2017 06/28/2014 06/16/2014 06/09/2014  Does Patient Have a Medical Advance Directive? No - Yes Yes No  Type of Advance Directive - [place];Living will [place];Living will Living will;Healthcare Power of Attorney -  Does patient want to make changes to medical advance directive? - - - No - Patient declined -  Copy of [place] in Chart? - No - copy requested - No - copy requested -  Would patient like information on creating a medical advance directive? No - Patient declined - - No - patient declined information No - patient declined information    Tobacco Social History   Tobacco Use  Smoking Status Current Every Day Smoker  . Packs/day: 1.00  . Years: 60.00  . Pack years: 60.00  . Types: Cigarettes  Smokeless Tobacco Never Used     Ready to quit: No Counseling given: No   Clinical Intake:  Pre-visit preparation completed: Yes  Pain : 0-10 Pain Score: 1  Pain Type: Chronic pain Pain Location: Back Pain Orientation: Lower Pain Onset: More than a month ago Pain Frequency: Constant Pain Relieving Factors: pain medication  Pain Relieving Factors: pain medication  Nutritional Status: BMI 25 -29 Overweight Nutritional Risks: None Diabetes: No  How often do you need to have someone help you when you read instructions, pamphlets, or other written materials from your doctor or  pharmacy?: 1 - Never What is the last grade level you completed in school?: 12th grade + some college  Interpreter Needed?: No  Comments: pt lives with spouse Information entered by :: LPinson, LPN  Past Medical History:  Diagnosis Date  . A-fib ([place]) 2013  . Adenomatous colon polyp 04/2008  . BPH (benign prostatic hypertrophy)   . CAD (coronary artery disease)   . 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 Complication of anesthesia    " I fainted a couple of times when they went to get me up."  . Early cataract   . ED (erectile dysfunction)   . GERD (gastroesophageal reflux disease)   . Hernia, abdominal    small  . History of colon polyps   . Hyperlipidemia   . Hypertension   . Lumbar herniated disc   . Microscopic hematuria   . Right arm weakness 04/17/2015  . Spinal stenosis   . Tobacco abuse    Past Surgical History:  Procedure Laterality Date  . ANTERIOR CERVICAL DECOMP/DISCECTOMY FUSION N/A 06/16/2014   Procedure: ANTERIOR CERVICAL DECOMPRESSION/DISCECTOMY FUSION CERVICAL FIVE-SIX,CERVICAL SIX-SEVEN;  Surgeon: Eustace Moore, MD;  Location: [facility];  Service: Neurosurgery;  Laterality: N/A;  . BACK SURGERY    . COLONOSCOPY  2002  . INGUINAL HERNIA REPAIR    . TONSILLECTOMY     Family History  Problem Relation Age of Onset  . Diabetes Father   .  Coronary artery disease Father   . Stroke Father   . Arthritis Mother   . Hiatal hernia Mother   . Pulmonary embolism Mother   . Colon cancer Neg Hx   . Prostate cancer Neg Hx    Social History   Socioeconomic History  . Marital status: Married    Spouse name: Not on file  . Number of children: Not on file  . Years of education: Not on file  . Highest education level: Not on file  Occupational History  . Not on file  Social Needs  . Financial resource strain: Not on file  . Food insecurity    Worry: Not on file    Inability: Not on file  . Transportation needs    Medical: Not  on file    Non-medical: Not on file  Tobacco Use  . Smoking status: Current Every Day Smoker    Packs/day: 1.00    Years: 60.00    Pack years: 60.00    Types: Cigarettes  . Smokeless tobacco: Never Used  Substance and Sexual Activity  . Alcohol use: No  . Drug use: No  . Sexual activity: Not on file  Lifestyle  . Physical activity    Days per week: Not on file    Minutes per session: Not on file  . Stress: Not on file  Relationships  . Social Herbalist on phone: Not on file    Gets together: Not on file    Attends religious service: Not on file    Active member of club or organization: Not on file    Attends meetings of clubs or organizations: Not on file    Relationship status: Not on file  Other Topics Concern  . Not on file  Social History Narrative   Lives in [place].   Retired from [employer]),  Development worker, international aid   Active with [employer], [place] [employer] [place]      Married (1963, wife [name]) with 5 children    Current Smoker: about 1 pack per day, transition to e-cig   Alcohol use-no    Caffeine use/day:  2 beverages daily   Diet : regular      Moved to [place] in 2005    Outpatient Encounter Medications as of 09/26/2017  Medication Sig  . Cholecalciferol (VITAMIN D3) 125 MCG (5000 UT) CAPS Take 10,000 Units by mouth daily.   . traMADol (ULTRAM) 50 MG tablet Take 100 mg by mouth 2 (two) times daily as needed.   . [DISCONTINUED] allopurinol (ZYLOPRIM) 100 MG tablet Take 300 mg by mouth daily.   . [DISCONTINUED] allopurinol (ZYLOPRIM) 300 MG tablet TAKE 1 TABLET BY MOUTH ONCE A DAY  . [DISCONTINUED] diltiazem (TIAZAC) 180 MG 24 hr capsule TAKE 1 CAPSULE BY MOUTH ONCE DAILY  . [DISCONTINUED] finasteride (PROSCAR) 5 MG tablet TAKE 1 TABLET BY MOUTH EVERY DAY  . [DISCONTINUED] HYDROcodone-acetaminophen (NORCO/VICODIN) 5-325 MG tablet Take 1 tablet by mouth every 6 (six) hours as needed for severe pain.  . [DISCONTINUED]  KRILL OIL PO Take 2 capsules by mouth daily.  . [DISCONTINUED] metoprolol succinate (TOPROL-XL) 50 MG 24 hr tablet TAKE 1 TABLET (50 MG TOTAL) BY MOUTH DAILY. TAKE WITH OR IMMEDIATELY FOLLOWING A MEAL.  . [DISCONTINUED] simvastatin (ZOCOR) 10 MG tablet TAKE 1 TABLET BY MOUTH ONCE EVERY EVENING  . [DISCONTINUED] terazosin (HYTRIN) 10 MG capsule Take 1 capsule (10 mg total) by mouth at bedtime.  . [DISCONTINUED] Alveda Reasons  20 MG TABS tablet TAKE 1 TABLET BY MOUTH ONCE DAILY  . [DISCONTINUED] colchicine 0.6 MG tablet Take 0.6 mg by mouth daily as needed (for gout).   No facility-administered encounter medications on file as of 09/26/2017.     Activities of Daily Living In your present state of health, do you have any difficulty performing the following activities: 09/26/2017  Hearing? N  Vision? Y  Comment blurred vision in left eye  Difficulty concentrating or making decisions? N  Walking or climbing stairs? N  Dressing or bathing? N  Doing errands, shopping? N  Preparing Food and eating ? N  Using the Toilet? N  In the past six months, have you accidently leaked urine? N  Do you have problems with loss of bowel control? N  Managing your Medications? N  Managing your Finances? N  Housekeeping or managing your Housekeeping? N  Some recent data might be hidden    Patient Care Team: Tonia Ghent, MD as PCP - General (Family Medicine) Minna Merritts, MD as Consulting Physician (Cardiology) Murrell Redden, MD (Urology) Roseanne Kaufman, MD as Consulting Physician (Orthopedic Surgery) Elberta Spaniel, MD as Referring Physician (Neurology)   Assessment:   This is a routine wellness examination for Jaydien.   Hearing Screening   125Hz  250Hz  500Hz  1000Hz  2000Hz  3000Hz  4000Hz  6000Hz  8000Hz   Right ear:   40 40 40  40    Left ear:   40 40 40  40      Visual Acuity Screening   Right eye Left eye Both eyes  Without correction:     With correction: 20/20 20/40-1 20/20-2    Exercise  Activities and Dietary recommendations Current Exercise Habits: The patient does not participate in regular exercise at present, Exercise limited by: None identified  Goals    . Patient Stated     Starting 09/26/2017, I will continue to take medications as prescribed.        Fall Risk Fall Risk  09/26/2017 10/12/2013 09/30/2012  Falls in the past year? No No Yes  Number falls in past yr: - - 2 or more   Depression Screen PHQ 2/9 Scores 09/26/2017 10/12/2013 09/30/2012  PHQ - 2 Score 0 0 0  PHQ- 9 Score 0 - -    Cognitive Function MMSE - Mini Mental State Exam 09/26/2017  Orientation to time 5  Orientation to Place 5  Registration 3  Attention/ Calculation 0  Recall 3  Language- name 2 objects 0  Language- repeat 1  Language- follow 3 step command 3  Language- read & follow direction 0  Write a sentence 0  Copy design 0  Total score 20     PLEASE NOTE: A Mini-Cog screen was completed. Maximum score is 17. A value of 0 denotes this part of [place] MMSE was not completed or the patient failed this part of the Mini-Cog screening.   Mini-Cog Screening Orientation to Time - Max 5 pts Orientation to Place - Max 5 pts Registration - Max 3 pts Recall - Max 3 pts Language Repeat - Max 1 pts      Immunization History  Administered Date(s) Administered  . Influenza Split 11/26/2011, 11/26/2013  . Influenza Whole 02/10/2007  . Influenza-Unspecified 03/01/2011, 10/30/2012  . Pneumococcal Conjugate-13 10/12/2013  . Pneumococcal Polysaccharide-23 02/10/2007  . Td 04/09/1999, 05/29/2009    Screening Tests Health Maintenance  Topic Date Due  . COLONOSCOPY  07/24/2015  . INFLUENZA VACCINE  09/19/2018  . TETANUS/TDAP  05/30/2019  .  PNA vac Low Risk Adult  Completed       Plan:     I have personally reviewed, addressed, and noted the following in the patient's chart:  A. Medical and social history B. Use of alcohol, tobacco or illicit drugs  C. Current medications and  supplements D. Functional ability and status E.  Nutritional status F.  Physical activity G. Advance directives H. List of other physicians I.  Hospitalizations, surgeries, and ER visits in previous 12 months J.  Vitals (unless it is a telemedicine encounter) K. Screenings to include cognitive, depression, hearing, vision (NOTE: hearing and vision screenings not completed in telemedicine encounter) L. Referrals and appointments   In addition, I have reviewed and discussed with patient certain preventive protocols, quality metrics, and best practice recommendations. A written personalized care plan for preventive services and recommendations were provided to patient.  Signed,   Lindell Noe, MHA, BS, LPN Health Coach

## 2018-08-24 DIAGNOSIS — M545 Low back pain: Secondary | ICD-10-CM | POA: Diagnosis not present

## 2018-08-24 DIAGNOSIS — G894 Chronic pain syndrome: Secondary | ICD-10-CM | POA: Diagnosis not present

## 2018-08-24 DIAGNOSIS — M5136 Other intervertebral disc degeneration, lumbar region: Secondary | ICD-10-CM | POA: Diagnosis not present

## 2018-08-24 DIAGNOSIS — M546 Pain in thoracic spine: Secondary | ICD-10-CM | POA: Diagnosis not present

## 2018-08-24 DIAGNOSIS — M5416 Radiculopathy, lumbar region: Secondary | ICD-10-CM | POA: Diagnosis not present

## 2018-08-26 DIAGNOSIS — M5136 Other intervertebral disc degeneration, lumbar region: Secondary | ICD-10-CM | POA: Diagnosis not present

## 2018-08-26 DIAGNOSIS — G894 Chronic pain syndrome: Secondary | ICD-10-CM | POA: Diagnosis not present

## 2018-08-26 DIAGNOSIS — M5416 Radiculopathy, lumbar region: Secondary | ICD-10-CM | POA: Diagnosis not present

## 2018-08-28 ENCOUNTER — Encounter: Payer: Self-pay | Admitting: Family Medicine

## 2018-09-25 DIAGNOSIS — M47816 Spondylosis without myelopathy or radiculopathy, lumbar region: Secondary | ICD-10-CM | POA: Diagnosis not present

## 2018-09-25 DIAGNOSIS — M48061 Spinal stenosis, lumbar region without neurogenic claudication: Secondary | ICD-10-CM | POA: Diagnosis not present

## 2018-09-25 DIAGNOSIS — M47812 Spondylosis without myelopathy or radiculopathy, cervical region: Secondary | ICD-10-CM | POA: Diagnosis not present

## 2018-09-25 DIAGNOSIS — M4316 Spondylolisthesis, lumbar region: Secondary | ICD-10-CM | POA: Diagnosis not present

## 2018-09-25 DIAGNOSIS — M47814 Spondylosis without myelopathy or radiculopathy, thoracic region: Secondary | ICD-10-CM | POA: Diagnosis not present

## 2018-10-02 ENCOUNTER — Other Ambulatory Visit: Payer: Self-pay | Admitting: Cardiovascular Disease

## 2018-10-02 ENCOUNTER — Other Ambulatory Visit: Payer: Self-pay | Admitting: Family Medicine

## 2018-10-02 NOTE — Telephone Encounter (Signed)
Last OV 04/20/2018 crcl 68 Scr 1.12 on 04/11/2018 xarelto 20mg  sent to pharmacy

## 2018-10-02 NOTE — Telephone Encounter (Signed)
Please review for refill, Thanks !  

## 2018-10-05 ENCOUNTER — Other Ambulatory Visit (INDEPENDENT_AMBULATORY_CARE_PROVIDER_SITE_OTHER): Payer: PPO

## 2018-10-05 ENCOUNTER — Ambulatory Visit: Payer: PPO

## 2018-10-05 ENCOUNTER — Other Ambulatory Visit: Payer: Self-pay | Admitting: Family Medicine

## 2018-10-05 ENCOUNTER — Other Ambulatory Visit: Payer: Self-pay

## 2018-10-05 DIAGNOSIS — Z125 Encounter for screening for malignant neoplasm of prostate: Secondary | ICD-10-CM | POA: Diagnosis not present

## 2018-10-05 DIAGNOSIS — I10 Essential (primary) hypertension: Secondary | ICD-10-CM | POA: Diagnosis not present

## 2018-10-05 DIAGNOSIS — M1000 Idiopathic gout, unspecified site: Secondary | ICD-10-CM

## 2018-10-05 LAB — URIC ACID: Uric Acid, Serum: 4.7 mg/dL (ref 4.0–7.8)

## 2018-10-05 LAB — COMPREHENSIVE METABOLIC PANEL
ALT: 32 U/L (ref 0–53)
AST: 18 U/L (ref 0–37)
Albumin: 4.1 g/dL (ref 3.5–5.2)
Alkaline Phosphatase: 97 U/L (ref 39–117)
BUN: 14 mg/dL (ref 6–23)
CO2: 27 mEq/L (ref 19–32)
Calcium: 9.7 mg/dL (ref 8.4–10.5)
Chloride: 106 mEq/L (ref 96–112)
Creatinine, Ser: 1.09 mg/dL (ref 0.40–1.50)
GFR: 65.39 mL/min (ref >60.00)
Glucose, Bld: 97 mg/dL (ref 70–99)
Potassium: 4.4 mEq/L (ref 3.5–5.1)
Sodium: 142 mEq/L (ref 135–145)
Total Bilirubin: 0.5 mg/dL (ref 0.2–1.2)
Total Protein: 7.1 g/dL (ref 6.0–8.3)

## 2018-10-05 LAB — CBC WITH DIFFERENTIAL/PLATELET
Basophils Absolute: 0.1 10*3/uL (ref 0.0–0.1)
Basophils Relative: 0.6 % (ref 0.0–3.0)
Eosinophils Absolute: 0.3 10*3/uL (ref 0.0–0.7)
Eosinophils Relative: 2.3 % (ref 0.0–5.0)
HCT: 47.3 % (ref 39.0–52.0)
Hemoglobin: 15.6 g/dL (ref 13.0–17.0)
Lymphocytes Relative: 41 % (ref 12.0–46.0)
Lymphs Abs: 5.3 10*3/uL — ABNORMAL HIGH (ref 0.7–4.0)
MCHC: 33 g/dL (ref 30.0–36.0)
MCV: 93.6 fl (ref 78.0–100.0)
Monocytes Absolute: 0.8 10*3/uL (ref 0.1–1.0)
Monocytes Relative: 6.3 % (ref 3.0–12.0)
Neutro Abs: 6.5 10*3/uL (ref 1.4–7.7)
Neutrophils Relative %: 49.8 % (ref 43.0–77.0)
Platelets: 220 10*3/uL (ref 150.0–400.0)
RBC: 5.05 Mil/uL (ref 4.22–5.81)
RDW: 15.4 % (ref 11.5–15.5)
WBC: 13 10*3/uL — ABNORMAL HIGH (ref 4.0–10.5)

## 2018-10-05 LAB — LIPID PANEL
Cholesterol: 142 mg/dL (ref 0–200)
HDL: 41.8 mg/dL (ref >39.00)
NonHDL: 99.92
Total CHOL/HDL Ratio: 3
Triglycerides: 214 mg/dL — ABNORMAL HIGH (ref 0.0–149.0)
VLDL: 42.8 mg/dL — ABNORMAL HIGH (ref 0.0–40.0)

## 2018-10-05 LAB — LDL CHOLESTEROL, DIRECT: Direct LDL: 71 mg/dL

## 2018-10-05 LAB — PSA, MEDICARE: PSA: 3.68 ng/ml (ref 0.10–4.00)

## 2018-10-12 DIAGNOSIS — M961 Postlaminectomy syndrome, not elsewhere classified: Secondary | ICD-10-CM | POA: Diagnosis not present

## 2018-10-13 ENCOUNTER — Ambulatory Visit (INDEPENDENT_AMBULATORY_CARE_PROVIDER_SITE_OTHER): Payer: PPO | Admitting: Family Medicine

## 2018-10-13 ENCOUNTER — Other Ambulatory Visit: Payer: Self-pay

## 2018-10-13 ENCOUNTER — Encounter: Payer: Self-pay | Admitting: Family Medicine

## 2018-10-13 VITALS — BP 126/78 | HR 60 | Temp 98.0°F | Ht 69.0 in | Wt 194.2 lb

## 2018-10-13 DIAGNOSIS — I480 Paroxysmal atrial fibrillation: Secondary | ICD-10-CM

## 2018-10-13 DIAGNOSIS — E782 Mixed hyperlipidemia: Secondary | ICD-10-CM | POA: Diagnosis not present

## 2018-10-13 DIAGNOSIS — I10 Essential (primary) hypertension: Secondary | ICD-10-CM | POA: Diagnosis not present

## 2018-10-13 DIAGNOSIS — Z0000 Encounter for general adult medical examination without abnormal findings: Secondary | ICD-10-CM | POA: Diagnosis not present

## 2018-10-13 DIAGNOSIS — Z7189 Other specified counseling: Secondary | ICD-10-CM

## 2018-10-13 DIAGNOSIS — M1000 Idiopathic gout, unspecified site: Secondary | ICD-10-CM

## 2018-10-13 DIAGNOSIS — N401 Enlarged prostate with lower urinary tract symptoms: Secondary | ICD-10-CM

## 2018-10-13 DIAGNOSIS — Z23 Encounter for immunization: Secondary | ICD-10-CM | POA: Diagnosis not present

## 2018-10-13 DIAGNOSIS — M5440 Lumbago with sciatica, unspecified side: Secondary | ICD-10-CM | POA: Diagnosis not present

## 2018-10-13 DIAGNOSIS — Z85820 Personal history of malignant melanoma of skin: Secondary | ICD-10-CM

## 2018-10-13 DIAGNOSIS — R7989 Other specified abnormal findings of blood chemistry: Secondary | ICD-10-CM

## 2018-10-13 DIAGNOSIS — R972 Elevated prostate specific antigen [PSA]: Secondary | ICD-10-CM

## 2018-10-13 DIAGNOSIS — N138 Other obstructive and reflux uropathy: Secondary | ICD-10-CM

## 2018-10-13 NOTE — Progress Notes (Addendum)
I have personally reviewed the Medicare Annual Wellness questionnaire and have noted 1. The patient's medical and social history 2. Their use of alcohol, tobacco or illicit drugs 3. Their current medications and supplements 4. The patient's functional ability including ADL's, fall risks, home safety risks and hearing or visual             impairment. 5. Diet and physical activities 6. Evidence for depression or mood disorders  The patients weight, height, BMI have been recorded in the chart and visual acuity is per eye clinic.  I have made referrals, counseling and provided education to the patient based review of the above and I have provided the pt with a written personalized care plan for preventive services.  Provider list updated- see scanned forms.  Routine anticipatory guidance given to patient.  See health maintenance. The possibility exists that previously documented standard health maintenance information may have been brought forward from a previous encounter into this note.  If needed, that same information has been updated to reflect the current situation based on today's encounter.    Flu 2020 Shingles d/w pt.  See avs PNA utd Tetanus 2011 Colon cancer screening d/w pt.  He'll call GI about f/u.   Prostate cancer screening- see below.  Advance directive- wife designated if patient were incapacitated.  Cognitive function addressed- see scanned forms- and if abnormal then additional documentation follows.   Gout.  Still on baseline meds w/o flares. No ADE on med.   BPH.  PSA d/w pt.  Similar to prev.  Had seen urology prev.  He wanted to get a second opinion.  He wanted to get through back procedure.    Elevated Cholesterol: Using medications without problems: yes Muscle aches: not from statin.  Diet compliance: he is working on portion control.   Exercise: limited form back pain.   Chronic stable WBC elevation.   No fevers, longstanding issue for patient, "this has been  going on as long as I can remember."   Hypertension/AF.   Using medication without problems or lightheadedness:  Yes, w/o recent sx.  Still on baseline anticoagulation.  No bleeding Chest pain with exertion:no Edema:no Short of breath:no Labs d/w pt.   Chronic back pain.  He saw the back clinic with redo procedure upcoming re: stimulator.  We talked about his pain medication situation.  It may be reasonable for him to get his pain medication filled here through our clinic if his upcoming procedure is successful and if he does not need other interventions.  We can address that later on.  I will await notes from the [place] clinic in the meantime.  PMH and SH reviewed  Meds, vitals, and allergies reviewed.   ROS: Per HPI.  Unless specifically indicated otherwise in HPI, the patient denies:  General: fever. Eyes: acute vision changes ENT: sore throat Cardiovascular: chest pain Respiratory: SOB GI: vomiting GU: dysuria Musculoskeletal: acute back pain Derm: acute rash Neuro: acute motor dysfunction Psych: worsening mood Endocrine: polydipsia Heme: bleeding Allergy: hayfever  GEN: nad, alert and oriented HEENT: ncat NECK: supple w/o LA CV: rrr. PULM: ctab, no inc wob ABD: soft, +bs EXT: no edema SKIN: no acute rash  Health Maintenance  Topic Date Due  . COLONOSCOPY  07/24/2015  . TETANUS/TDAP  05/30/2019  . INFLUENZA VACCINE  Completed  . PNA vac Low Risk Adult  Completed

## 2018-10-13 NOTE — Patient Instructions (Addendum)
Check with your insurance to see if they will cover the shingrix shot. Please let me know when you want to see urology and GI.  I want you to see both.  Let me know if you need help with appointments.  We'll call about a dermatology appointment.  Take care.  Glad to see you.

## 2018-10-14 ENCOUNTER — Telehealth: Payer: Self-pay | Admitting: Family Medicine

## 2018-10-14 NOTE — Telephone Encounter (Signed)
Call patient.  One thing I did not mention at the office visit that I thought about after he left the office.  He has a history of mild carotid stenosis.  It would not be unreasonable to recheck this with an ultrasound.  If he wants to get through his other appointments first then he can call us back and let us know when he wants to go.  I think it makes sense to get the ultrasound done at some point, but I would not blame him for getting through his appointment at [facility] first.  Let me know how he wants to go on this.  Thanks.

## 2018-10-15 DIAGNOSIS — N400 Enlarged prostate without lower urinary tract symptoms: Secondary | ICD-10-CM | POA: Diagnosis not present

## 2018-10-15 DIAGNOSIS — M109 Gout, unspecified: Secondary | ICD-10-CM | POA: Diagnosis not present

## 2018-10-15 DIAGNOSIS — M5442 Lumbago with sciatica, left side: Secondary | ICD-10-CM | POA: Diagnosis not present

## 2018-10-15 DIAGNOSIS — I10 Essential (primary) hypertension: Secondary | ICD-10-CM | POA: Diagnosis not present

## 2018-10-15 DIAGNOSIS — I480 Paroxysmal atrial fibrillation: Secondary | ICD-10-CM | POA: Diagnosis not present

## 2018-10-15 DIAGNOSIS — G8929 Other chronic pain: Secondary | ICD-10-CM | POA: Diagnosis not present

## 2018-10-15 DIAGNOSIS — Z9189 Other specified personal risk factors, not elsewhere classified: Secondary | ICD-10-CM | POA: Diagnosis not present

## 2018-10-15 DIAGNOSIS — F17200 Nicotine dependence, unspecified, uncomplicated: Secondary | ICD-10-CM | POA: Diagnosis not present

## 2018-10-15 DIAGNOSIS — M48061 Spinal stenosis, lumbar region without neurogenic claudication: Secondary | ICD-10-CM | POA: Diagnosis not present

## 2018-10-15 DIAGNOSIS — M5441 Lumbago with sciatica, right side: Secondary | ICD-10-CM | POA: Diagnosis not present

## 2018-10-15 DIAGNOSIS — R7989 Other specified abnormal findings of blood chemistry: Secondary | ICD-10-CM | POA: Insufficient documentation

## 2018-10-15 NOTE — Assessment & Plan Note (Signed)
PSA d/w pt.  Similar to prev.  Had seen urology prev.  He wanted to get a second opinion.  He wanted to get through back procedure.

## 2018-10-15 NOTE — Telephone Encounter (Signed)
Patient advised and will call back after the other appointments.

## 2018-10-15 NOTE — Assessment & Plan Note (Signed)
No change in statin.  Continue work on diet.  He agrees.

## 2018-10-15 NOTE — Assessment & Plan Note (Signed)
Flu 2020 Shingles d/w pt.  See avs PNA utd Tetanus 2011 Colon cancer screening d/w pt.  He'll call GI about f/u.   Prostate cancer screening- see below.  Advance directive- wife designated if patient were incapacitated.  Cognitive function addressed- see scanned forms- and if abnormal then additional documentation follows.

## 2018-10-15 NOTE — Assessment & Plan Note (Signed)
Gout.  Still on baseline meds w/o flares. No ADE on med. Continue as is.

## 2018-10-15 NOTE — Assessment & Plan Note (Signed)
See above

## 2018-10-15 NOTE — Assessment & Plan Note (Signed)
No change in meds.  Labs discussed with patient.  He agrees.

## 2018-10-15 NOTE — Assessment & Plan Note (Signed)
He saw the back clinic with redo procedure upcoming re: stimulator.  We talked about his pain medication situation.  It may be reasonable for him to get his pain medication filled here through our clinic if his upcoming procedure is successful and if he does not need other interventions.  We can address that later on.  I will await notes from the [place] clinic in the meantime.

## 2018-10-15 NOTE — Assessment & Plan Note (Signed)
Chronic stable WBC elevation.   No fevers, longstanding issue for patient, "this has been going on as long as I can remember."  We can monitor episodically.

## 2018-10-15 NOTE — Assessment & Plan Note (Signed)
Advance directive- wife designated if patient were incapacitated.  

## 2018-10-16 ENCOUNTER — Other Ambulatory Visit: Payer: Self-pay | Admitting: Cardiovascular Disease

## 2018-10-17 DIAGNOSIS — M5441 Lumbago with sciatica, right side: Secondary | ICD-10-CM | POA: Diagnosis not present

## 2018-10-17 DIAGNOSIS — Z20828 Contact with and (suspected) exposure to other viral communicable diseases: Secondary | ICD-10-CM | POA: Diagnosis not present

## 2018-10-17 DIAGNOSIS — M48061 Spinal stenosis, lumbar region without neurogenic claudication: Secondary | ICD-10-CM | POA: Diagnosis not present

## 2018-10-17 DIAGNOSIS — G8929 Other chronic pain: Secondary | ICD-10-CM | POA: Diagnosis not present

## 2018-10-17 DIAGNOSIS — M5442 Lumbago with sciatica, left side: Secondary | ICD-10-CM | POA: Diagnosis not present

## 2018-10-20 DIAGNOSIS — F17210 Nicotine dependence, cigarettes, uncomplicated: Secondary | ICD-10-CM | POA: Diagnosis not present

## 2018-10-20 DIAGNOSIS — M5442 Lumbago with sciatica, left side: Secondary | ICD-10-CM | POA: Diagnosis not present

## 2018-10-20 DIAGNOSIS — Z8614 Personal history of Methicillin resistant Staphylococcus aureus infection: Secondary | ICD-10-CM | POA: Diagnosis not present

## 2018-10-20 DIAGNOSIS — M545 Low back pain: Secondary | ICD-10-CM | POA: Diagnosis not present

## 2018-10-20 DIAGNOSIS — Z85820 Personal history of malignant melanoma of skin: Secondary | ICD-10-CM | POA: Diagnosis not present

## 2018-10-20 DIAGNOSIS — M5440 Lumbago with sciatica, unspecified side: Secondary | ICD-10-CM | POA: Diagnosis not present

## 2018-10-20 DIAGNOSIS — M109 Gout, unspecified: Secondary | ICD-10-CM | POA: Diagnosis not present

## 2018-10-20 DIAGNOSIS — I480 Paroxysmal atrial fibrillation: Secondary | ICD-10-CM | POA: Diagnosis not present

## 2018-10-20 DIAGNOSIS — E785 Hyperlipidemia, unspecified: Secondary | ICD-10-CM | POA: Diagnosis not present

## 2018-10-20 DIAGNOSIS — Z981 Arthrodesis status: Secondary | ICD-10-CM | POA: Diagnosis not present

## 2018-10-20 DIAGNOSIS — G629 Polyneuropathy, unspecified: Secondary | ICD-10-CM | POA: Diagnosis not present

## 2018-10-20 DIAGNOSIS — N400 Enlarged prostate without lower urinary tract symptoms: Secondary | ICD-10-CM | POA: Diagnosis not present

## 2018-10-20 DIAGNOSIS — Z79899 Other long term (current) drug therapy: Secondary | ICD-10-CM | POA: Diagnosis not present

## 2018-10-20 DIAGNOSIS — I2510 Atherosclerotic heart disease of native coronary artery without angina pectoris: Secondary | ICD-10-CM | POA: Diagnosis not present

## 2018-10-20 DIAGNOSIS — I10 Essential (primary) hypertension: Secondary | ICD-10-CM | POA: Diagnosis not present

## 2018-10-20 DIAGNOSIS — G894 Chronic pain syndrome: Secondary | ICD-10-CM | POA: Diagnosis not present

## 2018-10-20 DIAGNOSIS — Z8719 Personal history of other diseases of the digestive system: Secondary | ICD-10-CM | POA: Diagnosis not present

## 2018-10-20 DIAGNOSIS — M5441 Lumbago with sciatica, right side: Secondary | ICD-10-CM | POA: Diagnosis not present

## 2018-10-20 DIAGNOSIS — R911 Solitary pulmonary nodule: Secondary | ICD-10-CM | POA: Diagnosis not present

## 2018-10-20 DIAGNOSIS — K219 Gastro-esophageal reflux disease without esophagitis: Secondary | ICD-10-CM | POA: Diagnosis not present

## 2018-10-20 DIAGNOSIS — Z7901 Long term (current) use of anticoagulants: Secondary | ICD-10-CM | POA: Diagnosis not present

## 2018-10-21 DIAGNOSIS — G894 Chronic pain syndrome: Secondary | ICD-10-CM | POA: Diagnosis not present

## 2018-10-21 DIAGNOSIS — M5442 Lumbago with sciatica, left side: Secondary | ICD-10-CM | POA: Diagnosis not present

## 2018-10-21 DIAGNOSIS — M5441 Lumbago with sciatica, right side: Secondary | ICD-10-CM | POA: Diagnosis not present

## 2018-10-21 DIAGNOSIS — Z9889 Other specified postprocedural states: Secondary | ICD-10-CM | POA: Diagnosis not present

## 2018-10-21 DIAGNOSIS — E782 Mixed hyperlipidemia: Secondary | ICD-10-CM | POA: Diagnosis not present

## 2018-10-21 DIAGNOSIS — R918 Other nonspecific abnormal finding of lung field: Secondary | ICD-10-CM | POA: Diagnosis not present

## 2018-10-21 DIAGNOSIS — I480 Paroxysmal atrial fibrillation: Secondary | ICD-10-CM | POA: Diagnosis not present

## 2018-10-21 DIAGNOSIS — I2510 Atherosclerotic heart disease of native coronary artery without angina pectoris: Secondary | ICD-10-CM | POA: Diagnosis not present

## 2018-10-21 DIAGNOSIS — G8929 Other chronic pain: Secondary | ICD-10-CM | POA: Diagnosis not present

## 2018-10-22 ENCOUNTER — Telehealth: Payer: Self-pay | Admitting: Cardiovascular Disease

## 2018-10-22 NOTE — Telephone Encounter (Signed)
Spoke with patients wife per release form and she states that they did not give him his cardiac medications and heart rates were up to 170's at one point this morning. Instructed her to have him take all of his cardiac medications and then check with surgeons office that did his procedure regarding xarelto and confirm which meds he can restart. She states that she has placed a call into their office and is waiting on a call back. Advised to take the metoprolol diltiazem and check with surgeon on the [place]. She read back all of these instructions and had no further questions at this time.

## 2018-10-22 NOTE — Telephone Encounter (Signed)
Patient had a spine stimulator surgery at [facility] with Dr. Rosine Door and was told to hold the metoprolol. Patient is now confused on whether he still be to holding the medication or now. Today patient is elevated HR issues today and an episode of A fib last night. HR is now 140, was previously 168. Please advise patient

## 2018-10-24 DIAGNOSIS — M5442 Lumbago with sciatica, left side: Secondary | ICD-10-CM | POA: Diagnosis not present

## 2018-10-24 DIAGNOSIS — M5441 Lumbago with sciatica, right side: Secondary | ICD-10-CM | POA: Diagnosis not present

## 2018-10-24 DIAGNOSIS — G8929 Other chronic pain: Secondary | ICD-10-CM | POA: Diagnosis not present

## 2018-10-24 DIAGNOSIS — M48061 Spinal stenosis, lumbar region without neurogenic claudication: Secondary | ICD-10-CM | POA: Diagnosis not present

## 2018-10-24 DIAGNOSIS — Z20828 Contact with and (suspected) exposure to other viral communicable diseases: Secondary | ICD-10-CM | POA: Diagnosis not present

## 2018-10-26 ENCOUNTER — Telehealth: Payer: Self-pay | Admitting: Family Medicine

## 2018-10-26 NOTE — Telephone Encounter (Signed)
L[place]-  Please call pt.  I saw outside records from [place].  He had a history of pulmonary nodules that were seen on prior CT years ago.  Those were not changing and he graduated from follow-up as of a few years ago.  I saw that he had recent CT done at [facility] that showed new nodules.  I pasted the report below.  He has a left lower lobe nodule that appears to be new and needs further evaluation.  He has coronary artery calcifications and an abdominal aortic calcification.  He needs a follow-up PET/CT regarding the pulmonary nodule, he may end up needing additional aortic imaging, and he may end up needing additional cardiac work-up.    Please let me know if [facility] is addressing the PET/CT.  I can refer him to pulmonary if needed.  ---------------------------- I need input from cardiology about his aortic imaging and potential additional cardiac work-up.  I routed this to Dr. Rockey Situ in the meantime, with appreciation. ----------------------------  1. 1.7 cm left lower lobe nodule, suspicious for primary lung neoplasm; recommend PET/CT for further evaluation. 2. Additional subcentimeter indeterminate pulmonary nodules are also noted. 3. Severe 3-vessel coronary artery calcifications.  4. Aortic calcifications as can be seen in aortic stenosis or aortic sclerosis. 5. Intraluminal infrarenal abdominal aortic calcification, may have been present on an MRI spine from July 2015, though direct comparison is difficult. This may represent peripherally calcified thrombus versus remote dissection; this could be further characterized with a dedicated abdominal aortic CTA at clinical discretion

## 2018-10-26 NOTE — Telephone Encounter (Signed)
Pam can we call him, -Needs to quit smoking -For any worsening shortness of breath or chest tightness will order stress testing.  Would have low threshold -Need to get cholesterol lower.  Would add Zetia 10 mg daily, simvastatin to 20 mg.  Goal LDL 60 or less

## 2018-10-27 DIAGNOSIS — G894 Chronic pain syndrome: Secondary | ICD-10-CM | POA: Diagnosis not present

## 2018-10-27 DIAGNOSIS — M5440 Lumbago with sciatica, unspecified side: Secondary | ICD-10-CM | POA: Diagnosis not present

## 2018-10-27 NOTE — Telephone Encounter (Signed)
Would check on heart rate,  Has PAF, often goes back to NSR on metoprolol with extra PRN

## 2018-10-27 NOTE — Telephone Encounter (Signed)
[facility] is handling getting the CT/PET scan and patient is seeing a pulmonologist there in October.  Looks like Dr. Rockey Situ has asked his assistant to call the patient about his advice.

## 2018-10-27 NOTE — Telephone Encounter (Signed)
Noted. Thanks.  I appreciate the help of all involved. I will await the consult notes.

## 2018-10-28 NOTE — Telephone Encounter (Signed)
Spoke with patient regarding elevated heart rates and reviewed medications. He states that currently it is doing good and is in the 70's. After further review I see that he has diltiazem ordered for breakthrough afib and patient states that he has never used that. Recommended that he use the Diltiazem 30 mg to see if that helps and he can take it up to 3 times a day. Advised that if he continues to have problems then please give [name] a call back. Let him know that I would forward to scheduling to have them call him for appointment which would be in November or December. He was agreeable with this plan and verbalized understanding to call back if he continued to have any problems.

## 2018-10-29 ENCOUNTER — Encounter: Payer: Self-pay | Admitting: Family Medicine

## 2018-10-30 DIAGNOSIS — G8929 Other chronic pain: Secondary | ICD-10-CM | POA: Diagnosis not present

## 2018-10-30 DIAGNOSIS — M5442 Lumbago with sciatica, left side: Secondary | ICD-10-CM | POA: Diagnosis not present

## 2018-10-30 DIAGNOSIS — M5441 Lumbago with sciatica, right side: Secondary | ICD-10-CM | POA: Diagnosis not present

## 2018-10-30 DIAGNOSIS — M47812 Spondylosis without myelopathy or radiculopathy, cervical region: Secondary | ICD-10-CM | POA: Diagnosis not present

## 2018-10-30 DIAGNOSIS — M47816 Spondylosis without myelopathy or radiculopathy, lumbar region: Secondary | ICD-10-CM | POA: Diagnosis not present

## 2018-10-30 DIAGNOSIS — M4316 Spondylolisthesis, lumbar region: Secondary | ICD-10-CM | POA: Diagnosis not present

## 2018-10-30 DIAGNOSIS — M47814 Spondylosis without myelopathy or radiculopathy, thoracic region: Secondary | ICD-10-CM | POA: Diagnosis not present

## 2018-11-03 DIAGNOSIS — Z20828 Contact with and (suspected) exposure to other viral communicable diseases: Secondary | ICD-10-CM | POA: Diagnosis not present

## 2018-11-03 DIAGNOSIS — F17210 Nicotine dependence, cigarettes, uncomplicated: Secondary | ICD-10-CM | POA: Diagnosis not present

## 2018-11-03 DIAGNOSIS — M5441 Lumbago with sciatica, right side: Secondary | ICD-10-CM | POA: Diagnosis not present

## 2018-11-03 DIAGNOSIS — I10 Essential (primary) hypertension: Secondary | ICD-10-CM | POA: Diagnosis not present

## 2018-11-03 DIAGNOSIS — I480 Paroxysmal atrial fibrillation: Secondary | ICD-10-CM | POA: Diagnosis not present

## 2018-11-03 DIAGNOSIS — M109 Gout, unspecified: Secondary | ICD-10-CM | POA: Diagnosis not present

## 2018-11-03 DIAGNOSIS — M5442 Lumbago with sciatica, left side: Secondary | ICD-10-CM | POA: Diagnosis not present

## 2018-11-03 DIAGNOSIS — N400 Enlarged prostate without lower urinary tract symptoms: Secondary | ICD-10-CM | POA: Diagnosis not present

## 2018-11-03 DIAGNOSIS — M5440 Lumbago with sciatica, unspecified side: Secondary | ICD-10-CM | POA: Diagnosis not present

## 2018-11-03 DIAGNOSIS — E785 Hyperlipidemia, unspecified: Secondary | ICD-10-CM | POA: Diagnosis not present

## 2018-11-03 DIAGNOSIS — G894 Chronic pain syndrome: Secondary | ICD-10-CM | POA: Diagnosis not present

## 2018-11-16 DIAGNOSIS — K219 Gastro-esophageal reflux disease without esophagitis: Secondary | ICD-10-CM | POA: Diagnosis not present

## 2018-11-16 DIAGNOSIS — M5442 Lumbago with sciatica, left side: Secondary | ICD-10-CM | POA: Diagnosis not present

## 2018-11-16 DIAGNOSIS — M5441 Lumbago with sciatica, right side: Secondary | ICD-10-CM | POA: Diagnosis not present

## 2018-11-16 DIAGNOSIS — G8929 Other chronic pain: Secondary | ICD-10-CM | POA: Diagnosis not present

## 2018-11-25 DIAGNOSIS — R918 Other nonspecific abnormal finding of lung field: Secondary | ICD-10-CM | POA: Diagnosis not present

## 2018-12-30 DIAGNOSIS — F17210 Nicotine dependence, cigarettes, uncomplicated: Secondary | ICD-10-CM | POA: Diagnosis not present

## 2018-12-30 DIAGNOSIS — R911 Solitary pulmonary nodule: Secondary | ICD-10-CM | POA: Diagnosis not present

## 2018-12-30 DIAGNOSIS — I2510 Atherosclerotic heart disease of native coronary artery without angina pectoris: Secondary | ICD-10-CM | POA: Diagnosis not present

## 2018-12-30 DIAGNOSIS — R918 Other nonspecific abnormal finding of lung field: Secondary | ICD-10-CM | POA: Diagnosis not present

## 2019-01-07 DIAGNOSIS — F17210 Nicotine dependence, cigarettes, uncomplicated: Secondary | ICD-10-CM | POA: Diagnosis not present

## 2019-01-07 DIAGNOSIS — N400 Enlarged prostate without lower urinary tract symptoms: Secondary | ICD-10-CM | POA: Diagnosis not present

## 2019-01-07 DIAGNOSIS — R3912 Poor urinary stream: Secondary | ICD-10-CM | POA: Diagnosis not present

## 2019-01-07 DIAGNOSIS — R3915 Urgency of urination: Secondary | ICD-10-CM | POA: Diagnosis not present

## 2019-01-07 DIAGNOSIS — R6889 Other general symptoms and signs: Secondary | ICD-10-CM | POA: Diagnosis not present

## 2019-01-07 DIAGNOSIS — R351 Nocturia: Secondary | ICD-10-CM | POA: Diagnosis not present

## 2019-01-07 DIAGNOSIS — N401 Enlarged prostate with lower urinary tract symptoms: Secondary | ICD-10-CM | POA: Diagnosis not present

## 2019-01-07 DIAGNOSIS — R911 Solitary pulmonary nodule: Secondary | ICD-10-CM | POA: Diagnosis not present

## 2019-01-21 ENCOUNTER — Other Ambulatory Visit: Payer: Self-pay | Admitting: Cardiovascular Disease

## 2019-01-22 DIAGNOSIS — Z85820 Personal history of malignant melanoma of skin: Secondary | ICD-10-CM | POA: Diagnosis not present

## 2019-01-22 DIAGNOSIS — Z87891 Personal history of nicotine dependence: Secondary | ICD-10-CM | POA: Diagnosis not present

## 2019-01-22 DIAGNOSIS — Z7901 Long term (current) use of anticoagulants: Secondary | ICD-10-CM | POA: Diagnosis not present

## 2019-01-22 DIAGNOSIS — R911 Solitary pulmonary nodule: Secondary | ICD-10-CM | POA: Diagnosis not present

## 2019-01-22 DIAGNOSIS — K5900 Constipation, unspecified: Secondary | ICD-10-CM | POA: Diagnosis not present

## 2019-01-22 DIAGNOSIS — R0602 Shortness of breath: Secondary | ICD-10-CM | POA: Diagnosis not present

## 2019-01-22 DIAGNOSIS — I4891 Unspecified atrial fibrillation: Secondary | ICD-10-CM | POA: Diagnosis not present

## 2019-02-14 DIAGNOSIS — G4733 Obstructive sleep apnea (adult) (pediatric): Secondary | ICD-10-CM | POA: Diagnosis not present

## 2019-02-19 HISTORY — PX: THOROCOTOMY WITH LOBECTOMY: SHX6118

## 2019-02-22 DIAGNOSIS — R911 Solitary pulmonary nodule: Secondary | ICD-10-CM | POA: Diagnosis not present

## 2019-02-24 DIAGNOSIS — J439 Emphysema, unspecified: Secondary | ICD-10-CM | POA: Diagnosis not present

## 2019-02-24 DIAGNOSIS — I2510 Atherosclerotic heart disease of native coronary artery without angina pectoris: Secondary | ICD-10-CM | POA: Diagnosis not present

## 2019-02-24 DIAGNOSIS — R918 Other nonspecific abnormal finding of lung field: Secondary | ICD-10-CM | POA: Diagnosis not present

## 2019-02-24 DIAGNOSIS — I480 Paroxysmal atrial fibrillation: Secondary | ICD-10-CM | POA: Diagnosis not present

## 2019-02-24 DIAGNOSIS — R69 Illness, unspecified: Secondary | ICD-10-CM | POA: Diagnosis not present

## 2019-02-24 DIAGNOSIS — G894 Chronic pain syndrome: Secondary | ICD-10-CM | POA: Diagnosis not present

## 2019-02-24 DIAGNOSIS — Z23 Encounter for immunization: Secondary | ICD-10-CM | POA: Diagnosis not present

## 2019-02-24 DIAGNOSIS — Z20822 Contact with and (suspected) exposure to covid-19: Secondary | ICD-10-CM | POA: Diagnosis not present

## 2019-02-24 DIAGNOSIS — I358 Other nonrheumatic aortic valve disorders: Secondary | ICD-10-CM | POA: Diagnosis not present

## 2019-02-24 DIAGNOSIS — C3432 Malignant neoplasm of lower lobe, left bronchus or lung: Secondary | ICD-10-CM | POA: Diagnosis not present

## 2019-02-24 DIAGNOSIS — J432 Centrilobular emphysema: Secondary | ICD-10-CM | POA: Diagnosis not present

## 2019-02-24 DIAGNOSIS — I10 Essential (primary) hypertension: Secondary | ICD-10-CM | POA: Diagnosis not present

## 2019-02-24 DIAGNOSIS — R911 Solitary pulmonary nodule: Secondary | ICD-10-CM | POA: Diagnosis not present

## 2019-02-24 DIAGNOSIS — K219 Gastro-esophageal reflux disease without esophagitis: Secondary | ICD-10-CM | POA: Diagnosis not present

## 2019-02-25 MED ORDER — SIMVASTATIN 10 MG PO TABS
10.00 | ORAL_TABLET | ORAL | Status: DC
Start: ? — End: 2019-02-25

## 2019-02-25 MED ORDER — TAMSULOSIN HCL 0.4 MG PO CAPS
0.40 | ORAL_CAPSULE | ORAL | Status: DC
Start: 2019-02-25 — End: 2019-02-25

## 2019-02-25 MED ORDER — POLYETHYLENE GLYCOL 3350 17 GM/SCOOP PO POWD
17.00 | ORAL | Status: DC
Start: ? — End: 2019-02-25

## 2019-02-25 MED ORDER — BISACODYL 10 MG RE SUPP
10.00 | RECTAL | Status: DC
Start: ? — End: 2019-02-25

## 2019-02-25 MED ORDER — DEXTROSE 50 % IV SOLN
12.50 | INTRAVENOUS | Status: DC
Start: ? — End: 2019-02-25

## 2019-02-25 MED ORDER — ACETAMINOPHEN 325 MG PO TABS
650.00 | ORAL_TABLET | ORAL | Status: DC
Start: 2019-02-25 — End: 2019-02-25

## 2019-02-25 MED ORDER — SENNOSIDES-DOCUSATE SODIUM 8.6-50 MG PO TABS
2.00 | ORAL_TABLET | ORAL | Status: DC
Start: 2019-02-25 — End: 2019-02-25

## 2019-02-25 MED ORDER — ONDANSETRON HCL 4 MG/2ML IJ SOLN
4.00 | INTRAMUSCULAR | Status: DC
Start: ? — End: 2019-02-25

## 2019-02-25 MED ORDER — IBUPROFEN 400 MG PO TABS
400.00 | ORAL_TABLET | ORAL | Status: DC
Start: 2019-02-25 — End: 2019-02-25

## 2019-02-25 MED ORDER — METOPROLOL SUCCINATE ER 50 MG PO TB24
50.00 | ORAL_TABLET | ORAL | Status: DC
Start: ? — End: 2019-02-25

## 2019-02-25 MED ORDER — GABAPENTIN 100 MG PO CAPS
100.00 | ORAL_CAPSULE | ORAL | Status: DC
Start: 2019-02-25 — End: 2019-02-25

## 2019-02-25 MED ORDER — PANTOPRAZOLE SODIUM 20 MG PO TBEC
20.00 | DELAYED_RELEASE_TABLET | ORAL | Status: DC
Start: 2019-02-26 — End: 2019-02-25

## 2019-02-25 MED ORDER — GLUCAGON (RDNA) 1 MG IJ KIT
1.00 | PACK | INTRAMUSCULAR | Status: DC
Start: ? — End: 2019-02-25

## 2019-02-25 MED ORDER — HEPARIN SODIUM (PORCINE) 5000 UNIT/ML IJ SOLN
5000.00 | INTRAMUSCULAR | Status: DC
Start: 2019-02-25 — End: 2019-02-25

## 2019-02-25 MED ORDER — GENERIC EXTERNAL MEDICATION
Status: DC
Start: ? — End: 2019-02-25

## 2019-02-25 MED ORDER — OXYCODONE HCL 5 MG PO TABS
5.00 | ORAL_TABLET | ORAL | Status: DC
Start: ? — End: 2019-02-25

## 2019-02-25 MED ORDER — MAGNESIUM HYDROXIDE 400 MG/5ML PO SUSP
30.00 | ORAL | Status: DC
Start: ? — End: 2019-02-25

## 2019-02-28 ENCOUNTER — Encounter: Payer: Self-pay | Admitting: Family Medicine

## 2019-03-01 ENCOUNTER — Other Ambulatory Visit: Payer: Self-pay | Admitting: Cardiovascular Disease

## 2019-03-04 ENCOUNTER — Telehealth: Payer: Self-pay

## 2019-03-04 NOTE — Telephone Encounter (Signed)
Noted. Thanks.  I'll defer to patient.

## 2019-03-04 NOTE — Telephone Encounter (Signed)
Called pt d/t he wanted dermatology referral deferred.  Pt still going through surgeries and wants deferred again until June 21.  Pt hasn't noted any areas of concern for melanoma.

## 2019-04-01 ENCOUNTER — Other Ambulatory Visit: Payer: Self-pay | Admitting: Cardiovascular Disease

## 2019-04-12 DIAGNOSIS — R972 Elevated prostate specific antigen [PSA]: Secondary | ICD-10-CM | POA: Diagnosis not present

## 2019-04-26 ENCOUNTER — Other Ambulatory Visit: Payer: Self-pay | Admitting: Family Medicine

## 2019-05-03 ENCOUNTER — Telehealth: Payer: Self-pay | Admitting: Cardiovascular Disease

## 2019-05-03 NOTE — Telephone Encounter (Signed)
Patient c/o Palpitations:  High priority if patient c/o lightheadedness, shortness of breath, or chest pain  1) How long have you had palpitations/irregular HR/ Afib? Are you having the symptoms now? The last two weeks he has a fib episodes twice a day. Saturday it was all day  2) Are you currently experiencing lightheadedness, SOB or CP? no  3) Do you have a history of afib (atrial fibrillation) or irregular heart rhythm? Yes, a fib  4) Have you checked your BP or HR? (document readings if available): does not remember BP, HR has been around 150  5) Are you experiencing any other symptoms?

## 2019-05-03 NOTE — Telephone Encounter (Signed)
Spoke with the patient. Patient st that he had an episode of AFIB on Saturday that lasted all day. Previously his AFIB episodes would last an hour or so and he would then convert to NSR. He is taking his medications as prescribed. He has not missed any doses of his Xarelto. During the episode on Saturday he did take his I the pocket Diltiazem 30 mg.  Patient was scheduled to see Dr.Gollan on 05/17/19. He rqst a sooner appt if avalable. Appt rescheduled for 05/04/19 @ 1:40pm with Dr.Gollan.  Patient is aware of the appt date and time.  He would like to know if Dr. Rockey Situ has reviewed his recent surgery notes from [facility]. Adv the patient that the results should be available to review in care everywhere and that he can discuss it with Dr. Rockey Situ at his appt.  Patient verbalized understanding.

## 2019-05-04 ENCOUNTER — Ambulatory Visit (INDEPENDENT_AMBULATORY_CARE_PROVIDER_SITE_OTHER): Payer: Medicare HMO | Admitting: Cardiovascular Disease

## 2019-05-04 ENCOUNTER — Encounter: Payer: Self-pay | Admitting: Cardiovascular Disease

## 2019-05-04 ENCOUNTER — Other Ambulatory Visit: Payer: Self-pay

## 2019-05-04 VITALS — BP 150/80 | HR 64 | Ht 70.0 in | Wt 194.5 lb

## 2019-05-04 DIAGNOSIS — I480 Paroxysmal atrial fibrillation: Secondary | ICD-10-CM

## 2019-05-04 DIAGNOSIS — M545 Low back pain: Secondary | ICD-10-CM | POA: Diagnosis not present

## 2019-05-04 DIAGNOSIS — M542 Cervicalgia: Secondary | ICD-10-CM | POA: Diagnosis not present

## 2019-05-04 DIAGNOSIS — G5601 Carpal tunnel syndrome, right upper limb: Secondary | ICD-10-CM | POA: Diagnosis not present

## 2019-05-04 DIAGNOSIS — I10 Essential (primary) hypertension: Secondary | ICD-10-CM | POA: Diagnosis not present

## 2019-05-04 DIAGNOSIS — E782 Mixed hyperlipidemia: Secondary | ICD-10-CM

## 2019-05-04 DIAGNOSIS — I700 Atherosclerosis of aorta: Secondary | ICD-10-CM | POA: Diagnosis not present

## 2019-05-04 DIAGNOSIS — F17200 Nicotine dependence, unspecified, uncomplicated: Secondary | ICD-10-CM

## 2019-05-04 DIAGNOSIS — R69 Illness, unspecified: Secondary | ICD-10-CM | POA: Diagnosis not present

## 2019-05-04 MED ORDER — MULTAQ 400 MG PO TABS: 400.0000 mg | ORAL_TABLET | Freq: Two times a day (BID) | ORAL | 6 refills | Status: DC

## 2019-05-04 NOTE — Patient Instructions (Addendum)
Medication Instructions:  For atrial fibrillation, Start multaq 1 pill twice a day  If you need a refill on your cardiac medications before your next appointment, please call your pharmacy.    Lab work: No new labs needed   If you have labs (blood work) drawn today and your tests are completely normal, you will receive your results only by: Marland Kitchen MyChart Message (if you have MyChart) OR . A paper copy in the mail If you have any lab test that is abnormal or we need to change your treatment, we will call you to review the results.   Testing/Procedures: Echo for atrial fib  Your physician has requested that you have an echocardiogram. Echocardiography is a painless test that uses sound waves to create images of your heart. It provides your doctor with information about the size and shape of your heart and how well your heart's chambers and valves are working. This procedure takes approximately one hour. There are no restrictions for this procedure.     Follow-Up: At [facility], you and your health needs are our priority.  As part of our continuing mission to provide you with exceptional heart care, we have created designated Provider Care Teams.  These Care Teams include your primary Cardiologist (physician) and Advanced Practice Providers (APPs -  Physician Assistants and Nurse Practitioners) who all work together to provide you with the care you need, when you need it.  . You will need a follow up appointment in 6 months  . Providers on your designated Care Team:   . Murray Hodgkins, NP . Christell Faith, PA-C . Marrianne Mood, PA-C  Any Other Special Instructions Will Be Listed Below (If Applicable).  For educational health videos Log in to : [email or web address] Or : [email or web address], password : [email or web address]

## 2019-05-04 NOTE — Progress Notes (Signed)
Patient ID: Greg Hughes, male   DOB: 1940/04/26, 79 y.o.   MRN: [id number] Cardiology Office Note  Date:  05/04/2019   ID:  Greg, Hughes [birth year], MRN [id number]  PCP:  Tonia Ghent, MD   Chief Complaint  Patient presents with  . Other    12 month follow up,. patient c/o more Afib spells. Meds reviewed verbally with patient.     HPI:  Greg Hughes is a very pleasant 79 y.o. gentleman with a  long history of smoking who continues to smoke one pack per day, hypertension,  paroxysmal atrial fibrillation,  hospital on 04/11/18 for Afib.    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   No recent hospitalizations On prior clinic visit was having rare episodes of atrial fibrillation Diltiazem 30 mg pills were called in to take as needed  Had nerve stimulator placed s/p neurostimulator pulse generator implantation 11/03/18,  Records reviewed from [facility]  02/2019, lung resection FDG avid lower lobe pulmonary nodule concerning for primary lung cancer. Records reviewed from [place], having more sometimes all day Rate sometimes up to 150 bpm Wife tried to get him to go to the emergency room at times  Concerned that Dr. Placing nerve stimulator looked at has heart presumably with echocardiogram and told him there were some issues No records in the system of echocardiogram  Compliant with his Xarelto Smoker  Lab work reviewed LDL 71 Total cholesterol 142 Creatinine 1.09 Normal electrolytes Glucose 97  EKG personally reviewed by myself on todays visit Shows sinus bradycardia rhythm. 64 bpm. Otherwise normal ECG  He presented to the hospital in 2013, [facility] with palpitations and shortness of breath. found to be in atrial fibrillation with RVR, rate 150 beats per minute. He was given IV Cardizem in the emergency room and he converted to normal sinus rhythm during his hospital course. He was discharged on Cardizem 120  mg daily.  Previously reported having rare episodes of atrial fibrillation lasting 20 minutes, possibly 4 episodes over the past year  Echocardiogram 11/08/2011 shows normal systolic function, normal left atrial size, otherwise normal study  PMH:   has a past medical history of A-fib ([place]) (2013), Adenomatous colon polyp (04/2008), BPH (benign prostatic hypertrophy), CAD (coronary artery disease), Cancer ([place]), Complication of anesthesia, Early cataract, ED (erectile dysfunction), GERD (gastroesophageal reflux disease), Hernia, abdominal, History of colon polyps, Hyperlipidemia, Hypertension, Lumbar herniated disc, Lung nodule, Microscopic hematuria, Right arm weakness (04/17/2015), Spinal stenosis, and Tobacco abuse.  PSH:    Past Surgical History:  Procedure Laterality Date  . ANTERIOR CERVICAL DECOMP/DISCECTOMY FUSION N/A 06/16/2014   Procedure: ANTERIOR CERVICAL DECOMPRESSION/DISCECTOMY FUSION CERVICAL FIVE-SIX,CERVICAL SIX-SEVEN;  Surgeon: Eustace Moore, MD;  Location: [place] [facility];  Service: Neurosurgery;  Laterality: N/A;  . BACK SURGERY    . COLONOSCOPY  2002  . INGUINAL HERNIA REPAIR    . SPINAL CORD STIMULATOR IMPLANT  2020   at [facility] clinic  . THOROCOTOMY WITH LOBECTOMY Left 2021  . TONSILLECTOMY      Current Outpatient Medications  Medication Sig Dispense Refill  . allopurinol (ZYLOPRIM) 300 MG tablet TAKE 1 TABLET BY MOUTH ONCE A DAY 90 tablet 3  . Cholecalciferol (VITAMIN D3) 125 MCG (5000 UT) CAPS Take 10,000 Units by mouth daily.     Marland Kitchen diltiazem (CARDIZEM CD) 180 MG 24 hr capsule TAKE 1 CAPSULE BY MOUTH ONCE DAILY 90 capsule 0  . diltiazem (CARDIZEM) 30 MG  tablet TAKE 1 TABLET BY MOUTH 3 TIMES A DAY AS NEEDED FOR BREAKTHROUGH ATRIAL FIB 90 tablet 3  . finasteride (PROSCAR) 5 MG tablet TAKE 1 TABLET BY MOUTH ONCE A DAY 90 tablet 1  . HYDROcodone-acetaminophen (NORCO/VICODIN) 5-325 MG tablet Take 1 tablet by mouth 3 (three) times daily as needed for moderate pain or severe  pain.    . metoprolol succinate (TOPROL-XL) 50 MG 24 hr tablet TAKE 1 TABLET BY MOUTH DAILY WITH OR IMMEDIATELY FOLLOWING A MEAL 90 tablet 1  . rivaroxaban (XARELTO) 20 MG TABS tablet Take 1 tablet (20 mg total) by mouth daily. With supper 30 tablet 5  . simvastatin (ZOCOR) 10 MG tablet TAKE 1 TABLET BY MOUTH ONCE EVERY EVENING 90 tablet 3  . terazosin (HYTRIN) 10 MG capsule TAKE 1 CAPSULE BY MOUTH EVERY NIGHT AT BEDTIME 90 capsule 3  . traMADol (ULTRAM) 50 MG tablet Take 100 mg by mouth 2 (two) times daily as needed.     . dronedarone (MULTAQ) 400 MG tablet Take 1 tablet (400 mg total) by mouth 2 (two) times daily with a meal. 60 tablet 6   No current facility-administered medications for this visit.    Allergies:   Patient has no known allergies.   Social History:  The patient  reports that he has been smoking cigarettes. He has a 60.00 pack-year smoking history. He has never used smokeless tobacco. He reports that he does not drink alcohol or use drugs.   Family History:   family history includes Arthritis in his mother; Coronary artery disease in his father; Diabetes in his father; Hiatal hernia in his mother; Pulmonary embolism in his mother; Stroke in his father.    Review of Systems: Review of Systems  Constitutional: Negative.   HENT: Negative.   Eyes: Negative.   Respiratory: Negative.   Cardiovascular: Positive for palpitations.       Tachycardia  Gastrointestinal: Negative.   Genitourinary: Negative.   Musculoskeletal: Negative.   Neurological: Negative.   Psychiatric/Behavioral: Negative.   All other systems reviewed and are negative.   PHYSICAL EXAM: VS:  BP (!) 150/80 (BP Location: Left Arm, Patient Position: Sitting, Cuff Size: Normal)   Pulse 64   Ht 5\' 10"  (1.778 m)   Wt 194 lb 8 oz (88.2 kg)   SpO2 98%   BMI 27.91 kg/m  , BMI Body mass index is 27.91 kg/m.  Constitutional:  oriented to person, place, and time. No distress.  HENT:  Head: Grossly  normal Eyes:  no discharge. No scleral icterus.  Neck: No JVD, no carotid bruits  Cardiovascular: Regular rate and rhythm, no murmurs appreciated  Pulmonary/Chest: Clear to auscultation bilaterally, no wheezes or rales Abdominal: Soft.  no distension.  no tenderness.  Musculoskeletal: Normal range of motion Neurological:  normal muscle tone. Coordination normal. No atrophy Skin: Skin warm and dry Psychiatric: normal affect, pleasant   Recent Labs: Lab work as above   Lipid Panel Lab Results  Component Value Date   CHOL 142 10/05/2018   HDL 41.80 10/05/2018   LDLCALC 79 09/26/2017   TRIG 214.0 (H) 10/05/2018      Wt Readings from Last 3 Encounters:  05/04/19 194 lb 8 oz (88.2 kg)  10/13/18 194 lb 3 oz (88.1 kg)  04/20/18 197 lb (89.4 kg)      ASSESSMENT AND PLAN:  Atrial fibrillation, unspecified type ([place])  Having more episodes Sometimes rates up to 150, symptomatic Compliant with his long-acting diltiazem, long-acting metoprolol, Xarelto Recommended  he add Multaq 400 twice daily If this does not help his paroxysmal episodes, may need to try other antiarrhythmic Echocardiogram pending  Essential hypertension Blood pressure is well controlled on today's visit. No changes made to the medications.  Hyperlipidemia Cholesterol is at goal on the current lipid regimen. No changes to the medications were made.  Aortic atherosclerosis (HCC) Recommend smoking cessation Cholesterol at goal Echocardiogram ordered  TOBACCO ABUSE Plan: He does not want Chantix Previously tried vapor cigarettes Cessation recommended  Chronic back pain Records reviewed from [facility], had spinal cord stimulator placed  Lung cancer Recent lobectomy, recovered well Worsening atrial fibrillation   Disposition:   F/U  12 months   Orders Placed This Encounter  Procedures  . EKG 12-Lead  . ECHOCARDIOGRAM COMPLETE    Total encounter time more than 45 minutes  Greater than 50% was spent  in counseling and coordination of care with the patient  Signed, Esmond Plants, M.D., Ph.D. 05/04/2019  [place], [place]

## 2019-05-11 ENCOUNTER — Ambulatory Visit: Payer: PPO | Admitting: Cardiovascular Disease

## 2019-05-17 ENCOUNTER — Ambulatory Visit: Payer: PPO | Admitting: Cardiovascular Disease

## 2019-06-08 ENCOUNTER — Ambulatory Visit (INDEPENDENT_AMBULATORY_CARE_PROVIDER_SITE_OTHER): Payer: Medicare HMO

## 2019-06-08 ENCOUNTER — Other Ambulatory Visit: Payer: Self-pay

## 2019-06-08 DIAGNOSIS — I480 Paroxysmal atrial fibrillation: Secondary | ICD-10-CM | POA: Diagnosis not present

## 2019-06-29 ENCOUNTER — Other Ambulatory Visit: Payer: Self-pay | Admitting: Cardiovascular Disease

## 2019-06-29 NOTE — Telephone Encounter (Signed)
Please review for refill. Thanks!  

## 2019-06-29 NOTE — Telephone Encounter (Signed)
Prescription refill request for Xarelto received.   Last office visit: 05/04/2019, Gollan Weight: 88.2 kg Age: 79 y.o. Scr: 1.09, 10/05/2018 CrCl: 69 ml/min   Prescription refill sent.

## 2019-07-02 ENCOUNTER — Telehealth: Payer: Self-pay

## 2019-07-02 NOTE — Telephone Encounter (Signed)
10-13-18 Dermatology referral denied by [facility].  Referral closed.  Pt has not responded to phone calls by [facility].  See Referral Notes/hx.

## 2019-07-03 ENCOUNTER — Emergency Department (HOSPITAL_COMMUNITY): Payer: Medicare HMO

## 2019-07-03 ENCOUNTER — Encounter (HOSPITAL_COMMUNITY): Payer: Self-pay | Admitting: *Deleted

## 2019-07-03 ENCOUNTER — Emergency Department (HOSPITAL_COMMUNITY)
Admission: EM | Admit: 2019-07-03 | Discharge: 2019-07-03 | Disposition: A | Discharge status: Home / Self Care | Payer: Medicare HMO | Attending: Emergency Medicine | Admitting: Emergency Medicine

## 2019-07-03 ENCOUNTER — Other Ambulatory Visit: Payer: Self-pay

## 2019-07-03 DIAGNOSIS — I2510 Atherosclerotic heart disease of native coronary artery without angina pectoris: Secondary | ICD-10-CM | POA: Diagnosis not present

## 2019-07-03 DIAGNOSIS — G8929 Other chronic pain: Secondary | ICD-10-CM | POA: Diagnosis not present

## 2019-07-03 DIAGNOSIS — Z85820 Personal history of malignant melanoma of skin: Secondary | ICD-10-CM | POA: Insufficient documentation

## 2019-07-03 DIAGNOSIS — J449 Chronic obstructive pulmonary disease, unspecified: Secondary | ICD-10-CM | POA: Insufficient documentation

## 2019-07-03 DIAGNOSIS — M25551 Pain in right hip: Secondary | ICD-10-CM | POA: Diagnosis not present

## 2019-07-03 DIAGNOSIS — M545 Low back pain, unspecified: Secondary | ICD-10-CM

## 2019-07-03 DIAGNOSIS — F17210 Nicotine dependence, cigarettes, uncomplicated: Secondary | ICD-10-CM | POA: Insufficient documentation

## 2019-07-03 DIAGNOSIS — M25552 Pain in left hip: Secondary | ICD-10-CM | POA: Diagnosis not present

## 2019-07-03 DIAGNOSIS — I10 Essential (primary) hypertension: Secondary | ICD-10-CM | POA: Diagnosis not present

## 2019-07-03 DIAGNOSIS — R69 Illness, unspecified: Secondary | ICD-10-CM | POA: Diagnosis not present

## 2019-07-03 DIAGNOSIS — Z79899 Other long term (current) drug therapy: Secondary | ICD-10-CM | POA: Insufficient documentation

## 2019-07-03 DIAGNOSIS — M160 Bilateral primary osteoarthritis of hip: Secondary | ICD-10-CM | POA: Diagnosis not present

## 2019-07-03 MED ORDER — HYDROMORPHONE HCL 1 MG/ML IJ SOLN
0.5000 mg | Freq: Once | INTRAMUSCULAR | Status: AC
  Administered 2019-07-03: 0.5 mg via INTRAVENOUS
  Filled 2019-07-03: qty 1

## 2019-07-03 MED ORDER — OXYCODONE-ACETAMINOPHEN 5-325 MG PO TABS
1.0000 | ORAL_TABLET | Freq: Once | ORAL | Status: AC
  Administered 2019-07-03: 1 via ORAL
  Filled 2019-07-03: qty 1

## 2019-07-03 NOTE — ED Triage Notes (Signed)
The pt reports severe back pain for 1-2 wweeks  He hads a spinal cord stimulator in place and one of his hips dislocated  He pushed it back in place and since then  He has had much pain

## 2019-07-03 NOTE — ED Notes (Signed)
Patient verbalizes understanding of discharge instructions . Opportunity for questions and answers were provided . Armband removed by staff ,Pt discharged from ED. W/C  offered at D/C  and Declined W/C at D/C and was escorted to lobby by RN.  

## 2019-07-03 NOTE — ED Provider Notes (Signed)
[facility] EMERGENCY DEPARTMENT Provider Note   CSN: [id number] Arrival date & time: 07/03/19  9242     History Chief Complaint  Patient presents with  . Back Pain    Greg Hughes is a 79 y.o. male.  79 year old male with extensive past medical history below including atrial fibrillation, CAD, spinal stenosis and chronic back pain with spinal nerve stimulator and previous lumbar spine surgery who presents with bilateral hip pain.  Patient states that 1 to 2 weeks ago, he was mowing his lawn when he had a sudden onset of pain in his right hip and he felt like he had dislocated it; pushed on it and felt like it went back in. He denies any actual trauma or fall, no previous hip surgery or injury. Since then, he has been having severe pain in bilateral hips.  He notes that he has history of chronic back problems and takes tramadol for this, also follows with pain specialist.  Denies any new leg weakness/numbness, incontinence, or saddle anesthesia.  He reports problems with constipation that he thinks are related to tramadol.  No fevers or recent illness.  No trauma.  The history is provided by the patient.  Back Pain      Past Medical History:  Diagnosis Date  . A-fib ([place]) 2013  . Adenomatous colon polyp 04/2008  . BPH (benign prostatic hypertrophy)   . CAD (coronary artery disease)   . 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 Complication of anesthesia    " I fainted a couple of times when they went to get me up."  . Early cataract   . ED (erectile dysfunction)   . GERD (gastroesophageal reflux disease)   . Hernia, abdominal    small  . History of colon polyps   . Hyperlipidemia   . Hypertension   . Lumbar herniated disc   . Lung nodule   . Microscopic hematuria   . Right arm weakness 04/17/2015  . Spinal stenosis   . Tobacco abuse     Patient Active Problem List   Diagnosis Date Noted  . Abnormal CBC 10/15/2018  .  Health care maintenance 10/03/2017  . PSA elevation 10/03/2017  . History of melanoma   . Advance care planning 06/08/2017  . Benign fibroma of prostate 04/17/2015  . Encounter for anticoagulation discussion and counseling 03/09/2015  . S/P cervical spinal fusion 06/16/2014  . Aortic atherosclerosis ([place]) 05/16/2014  . Adrenal adenoma 04/21/2014  . Cystic disease of kidney 03/03/2014  . Spinal stenosis of lumbar region without neurogenic claudication 10/07/2013  . Lung nodules 01/05/2013  . Medicare annual wellness visit, subsequent 09/30/2012  . Gout 09/30/2012  . Atrial fibrillation ([place]) 11/18/2011  . COPD 04/30/2010  . ERECTILE DYSFUNCTION 10/20/2007  . Benign prostatic hyperplasia with urinary obstruction 08/09/2007  . TOBACCO ABUSE 02/10/2007  . Microscopic hematuria 02/10/2007  . BACK PAIN, LUMBAR, CHRONIC 02/10/2007  . HLD (hyperlipidemia) 02/05/2007  . Essential hypertension 02/05/2007    Past Surgical History:  Procedure Laterality Date  . ANTERIOR CERVICAL DECOMP/DISCECTOMY FUSION N/A 06/16/2014   Procedure: ANTERIOR CERVICAL DECOMPRESSION/DISCECTOMY FUSION CERVICAL FIVE-SIX,CERVICAL SIX-SEVEN;  Surgeon: Eustace Moore, MD;  Location: [facility];  Service: Neurosurgery;  Laterality: N/A;  . BACK SURGERY    . COLONOSCOPY  2002  . INGUINAL HERNIA REPAIR    . SPINAL CORD STIMULATOR IMPLANT  2020   at [facility] clinic  . THOROCOTOMY WITH LOBECTOMY Left  2021  . TONSILLECTOMY         Family History  Problem Relation Age of Onset  . Diabetes Father   . Coronary artery disease Father   . Stroke Father   . Arthritis Mother   . Hiatal hernia Mother   . Pulmonary embolism Mother   . Colon cancer Neg Hx   . Prostate cancer Neg Hx     Social History   Tobacco Use  . Smoking status: Current Every Day Smoker    Packs/day: 1.00    Years: 60.00    Pack years: 60.00    Types: Cigarettes  . Smokeless tobacco: Never Used  Substance Use Topics  . Alcohol use: No  .  Drug use: No    Home Medications Prior to Admission medications   Medication Sig Start Date End Date Taking? Authorizing Provider  allopurinol (ZYLOPRIM) 300 MG tablet TAKE 1 TABLET BY MOUTH ONCE A DAY 10/02/18   Tonia Ghent, MD  Cholecalciferol (VITAMIN D3) 125 MCG (5000 UT) CAPS Take 10,000 Units by mouth daily.     [provider]  diltiazem (CARDIZEM CD) 180 MG 24 hr capsule TAKE 1 CAPSULE BY MOUTH ONCE DAILY 03/01/19   Minna Merritts, MD  diltiazem (CARDIZEM) 30 MG tablet TAKE 1 TABLET BY MOUTH 3 TIMES A DAY AS NEEDED FOR BREAKTHROUGH ATRIAL FIB 05/11/18   Minna Merritts, MD  dronedarone (MULTAQ) 400 MG tablet Take 1 tablet (400 mg total) by mouth 2 (two) times daily with a meal. 05/04/19   Gollan, Kathlene November, MD  finasteride (PROSCAR) 5 MG tablet TAKE 1 TABLET BY MOUTH ONCE A DAY 04/26/19   Tonia Ghent, MD  HYDROcodone-acetaminophen (NORCO/VICODIN) 5-325 MG tablet Take 1 tablet by mouth 3 (three) times daily as needed for moderate pain or severe pain. 04/10/18   [provider]  metoprolol succinate (TOPROL-XL) 50 MG 24 hr tablet TAKE 1 TABLET BY MOUTH DAILY WITH OR IMMEDIATELY FOLLOWING A MEAL 04/01/19   Gollan, Kathlene November, MD  simvastatin (ZOCOR) 10 MG tablet TAKE 1 TABLET BY MOUTH ONCE EVERY EVENING 10/02/18   Tonia Ghent, MD  terazosin (HYTRIN) 10 MG capsule TAKE 1 CAPSULE BY MOUTH EVERY NIGHT AT BEDTIME 10/02/18   Tonia Ghent, MD  traMADol (ULTRAM) 50 MG tablet Take 100 mg by mouth 2 (two) times daily as needed.  02/09/15   [provider]  XARELTO 20 MG TABS tablet TAKE 1 TABLET BY MOUTH ONCE DAILY WITH SUPPER 06/29/19   Minna Merritts, MD    Allergies    Patient has no known allergies.  Review of Systems   Review of Systems  Musculoskeletal: Positive for back pain.   All other systems reviewed and are negative except that which was mentioned in HPI  Physical Exam Updated Vital Signs BP 138/61 (BP Location: Right Arm)   Pulse 67    Temp 98 F (36.7 C) (Oral)   Resp 16   Ht 5\' 10"  (1.778 m)   Wt 77.1 kg   SpO2 97%   BMI 24.39 kg/m   Physical Exam Vitals and nursing note reviewed.  Constitutional:      General: He is not in acute distress.    Appearance: He is well-developed.     Comments: Uncomfortable, laying on right side  HENT:     Head: Normocephalic and atraumatic.  Eyes:     Conjunctiva/sclera: Conjunctivae normal.  Musculoskeletal:        General: No tenderness.  Normal range of motion.     Cervical back: Neck supple.     Comments: No midline spinal tenderness, no focal back or hip tenderness  Skin:    General: Skin is warm and dry.  Neurological:     Mental Status: He is alert and oriented to person, place, and time.     Sensory: No sensory deficit.     Motor: No weakness.     Deep Tendon Reflexes: Reflexes normal.     Comments: 5/5 strength BLE  Psychiatric:        Judgment: Judgment normal.     ED Results / Procedures / Treatments   Labs (all labs ordered are listed, but only abnormal results are displayed) Labs Reviewed - No data to display  EKG None  Radiology DG Lumbar Spine Complete  Result Date: 07/03/2019 CLINICAL DATA:  The pt reports severe back pain for 1-2 wweeks He hads a spinal cord stimulator in place and one of his hips dislocated He pushed it back in place and since then EXAM: LUMBAR SPINE - COMPLETE 4+ VIEW COMPARISON:  11/25/2014 FINDINGS: No fracture or acute finding. Status post L3 through L5 posterior lumbar spine fusion. Pedicle screws are well seated and positioned. Interconnecting rods are intact. Radiolucent disc spacers are well centered. Mild loss of disc height at L1-L2 with moderate loss of disc height at L2-L3. Mild to moderate loss of disc height at L5-S1. These degenerative changes have increased when compared to the prior radiographs. Dense atherosclerotic calcification is noted along the abdominal aorta. There are spine stimulator leads extending to the  lower thoracic spinal canal, new since the prior exam. IMPRESSION: 1. No fracture or acute finding. 2. No evidence of loosening/disruption of the orthopedic hardware fusing L3 through L5. 3. Degenerative changes at the unfused lumbar levels have increased when compared to the prior exam. Electronically Signed   By: Lajean Manes M.D.   On: 07/03/2019 10:44   DG Hips Bilat W or Wo Pelvis 2 Views  Result Date: 07/03/2019 CLINICAL DATA:  Bilateral hip pain.  History of lumbar spine fusion. EXAM: DG HIP (WITH OR WITHOUT PELVIS) 2V BILAT COMPARISON:  None. FINDINGS: No fracture or bone lesion. Hip joints are normally spaced and aligned. There is mild superior, lateral acetabular spurring, and on the left, subchondral sclerosis. Small marginal osteophytes project from the base of the left femoral head. The SI joints and symphysis pubis are normally spaced and aligned. No acute soft tissue abnormality. There are scattered arterial vascular calcifications. IMPRESSION: 1. No fracture, bone lesion or acute finding. 2. Mild left and minor right hip joint degenerative change. Electronically Signed   By: Lajean Manes M.D.   On: 07/03/2019 10:46    Procedures Procedures (including critical care time)  Medications Ordered in ED Medications  HYDROmorphone (DILAUDID) injection 0.5 mg (0.5 mg Intravenous Given 07/03/19 0938)  oxyCODONE-acetaminophen (PERCOCET/ROXICET) 5-325 MG per tablet 1 tablet (1 tablet Oral Given 07/03/19 9798)    ED Course  I have reviewed the triage vital signs and the nursing notes.  Pertinent imaging results that were available during my care of the patient were reviewed by me and considered in my medical decision making (see chart for details).    MDM Rules/Calculators/A&P                      Pt neurologically intact distally. No signs/sx of cauda equina or infectious process.  His description of feeling like his right hip is  located is audible given that he has no history of hip  replacement and had no preceding trauma or injury.  The fact that his pain is bilateral suggest the possibility that his pain is coming from his lumbar spine.  Plain films show no disruption of hardware or acute abnormalities of lumbar spine and no acute findings on bilateral hip x-rays.  After receiving the above medications, he is feeling much better on reassessment.  I have encouraged him to follow with his pain specialist and his spine specialist at [facility] regarding ongoing management.  Explained that because he is on chronic narcotics I am not able to prescribe any new narcotic prescriptions at this time.  Reviewed return precautions and he voiced understanding. Final Clinical Impression(s) / ED Diagnoses Final diagnoses:  Bilateral hip pain  Chronic low back pain, unspecified back pain laterality, unspecified whether sciatica present    Rx / DC Orders ED Discharge Orders    None       Kolby Myung, Wenda Overland, MD 07/03/19 1138

## 2019-07-05 NOTE — Telephone Encounter (Signed)
Noted. Thanks.

## 2019-07-09 DIAGNOSIS — G8929 Other chronic pain: Secondary | ICD-10-CM | POA: Diagnosis not present

## 2019-07-09 DIAGNOSIS — M40204 Unspecified kyphosis, thoracic region: Secondary | ICD-10-CM | POA: Diagnosis not present

## 2019-07-09 DIAGNOSIS — M4312 Spondylolisthesis, cervical region: Secondary | ICD-10-CM | POA: Diagnosis not present

## 2019-07-09 DIAGNOSIS — M5442 Lumbago with sciatica, left side: Secondary | ICD-10-CM | POA: Diagnosis not present

## 2019-07-09 DIAGNOSIS — M5114 Intervertebral disc disorders with radiculopathy, thoracic region: Secondary | ICD-10-CM | POA: Diagnosis not present

## 2019-07-09 DIAGNOSIS — M5116 Intervertebral disc disorders with radiculopathy, lumbar region: Secondary | ICD-10-CM | POA: Diagnosis not present

## 2019-07-09 DIAGNOSIS — M5441 Lumbago with sciatica, right side: Secondary | ICD-10-CM | POA: Diagnosis not present

## 2019-07-20 ENCOUNTER — Telehealth: Payer: Self-pay | Admitting: Family Medicine

## 2019-07-20 NOTE — Telephone Encounter (Signed)
Pt wife calling  - very concerned of patients health Pt had a spinal stimulator put in about 6 months ago.  Severe pain started about 3-4 weeks ago, becoming more severe in lower back/hip region. Pt has lost 30lbs and has had an increase in falls over the last 3 weeks.  Pain Management is not able to see the patient yet. They are still reviewing his chart to determine if he is a good candidate - they advised the patient that they do not see emergency cases and that he needed to contact his PCP.  Pt has been taking his wife's Hydrocodone as OTC Tylenol, Ibuprofen, Advil, Aleve does not touch his pain. Wife states that the Hydrocodone does not even seem to work.   Wife is very concerned - states that she is just watching her husband die in front of her eyes  There are no openings today with Dr Damita Dunnings I advised that he had openings on Thurdays/Friday  - wife doesn't want to wait much longer.   Dr Damita Dunnings, Please advise, thanks.

## 2019-07-20 NOTE — Telephone Encounter (Signed)
Patient is scheduled with Dr. Silvio Pate on Wednesday, June 2.

## 2019-07-20 NOTE — Telephone Encounter (Signed)
I am out of the clinic tomorrow due to a death in my family.  I can see him soon thereafter but if any other provider can see patient tomorrow then I support that.  Please give him routine ER cautions.  Thanks.

## 2019-07-21 ENCOUNTER — Telehealth: Payer: Self-pay | Admitting: Family Medicine

## 2019-07-21 ENCOUNTER — Other Ambulatory Visit: Payer: Self-pay

## 2019-07-21 ENCOUNTER — Encounter: Payer: Self-pay | Admitting: Internal Medicine

## 2019-07-21 ENCOUNTER — Ambulatory Visit (INDEPENDENT_AMBULATORY_CARE_PROVIDER_SITE_OTHER): Payer: Medicare HMO | Admitting: Internal Medicine

## 2019-07-21 VITALS — BP 122/78 | HR 62 | Temp 97.2°F | Ht 70.0 in | Wt 173.0 lb

## 2019-07-21 DIAGNOSIS — M5416 Radiculopathy, lumbar region: Secondary | ICD-10-CM | POA: Diagnosis not present

## 2019-07-21 DIAGNOSIS — E441 Mild protein-calorie malnutrition: Secondary | ICD-10-CM | POA: Diagnosis not present

## 2019-07-21 DIAGNOSIS — M5459 Other low back pain: Secondary | ICD-10-CM | POA: Insufficient documentation

## 2019-07-21 LAB — CBC
HCT: 43.1 % (ref 39.0–52.0)
Hemoglobin: 14.5 g/dL (ref 13.0–17.0)
MCHC: 33.7 g/dL (ref 30.0–36.0)
MCV: 91.1 fl (ref 78.0–100.0)
Platelets: 231 10*3/uL (ref 150.0–400.0)
RBC: 4.73 Mil/uL (ref 4.22–5.81)
RDW: 14.8 % (ref 11.5–15.5)
WBC: 13 10*3/uL — ABNORMAL HIGH (ref 4.0–10.5)

## 2019-07-21 LAB — COMPREHENSIVE METABOLIC PANEL
ALT: 20 U/L (ref 0–53)
AST: 25 U/L (ref 0–37)
Albumin: 4.1 g/dL (ref 3.5–5.2)
Alkaline Phosphatase: 125 U/L — ABNORMAL HIGH (ref 39–117)
BUN: 16 mg/dL (ref 6–23)
CO2: 31 mEq/L (ref 19–32)
Calcium: 10.9 mg/dL — ABNORMAL HIGH (ref 8.4–10.5)
Chloride: 100 mEq/L (ref 96–112)
Creatinine, Ser: 1.02 mg/dL (ref 0.40–1.50)
GFR: 70.45 mL/min (ref >60.00)
Glucose, Bld: 102 mg/dL — ABNORMAL HIGH (ref 70–99)
Potassium: 3.9 mEq/L (ref 3.5–5.1)
Sodium: 139 mEq/L (ref 135–145)
Total Bilirubin: 0.6 mg/dL (ref 0.2–1.2)
Total Protein: 6.9 g/dL (ref 6.0–8.3)

## 2019-07-21 LAB — SEDIMENTATION RATE: Sed Rate: 21 mm/hr — ABNORMAL HIGH (ref 0–20)

## 2019-07-21 LAB — T4, FREE: Free T4: 1.2 ng/dL (ref 0.60–1.60)

## 2019-07-21 MED ORDER — PREGABALIN 25 MG PO CAPS: 25.0000 mg | ORAL_CAPSULE | Freq: Three times a day (TID) | ORAL | 1 refills | Status: DC

## 2019-07-21 NOTE — Assessment & Plan Note (Signed)
Doesn't seem to be from his hips---x-ray not remarkable 2 weeks ago Has stimulator--but not helping Awaiting new pain medication doctor  Narcotics not helpful Didn't like gabapentin Will try lyrica Given the leg weakness, he probably needs new MRI and consideration for neurosurgical consultation

## 2019-07-21 NOTE — Patient Instructions (Signed)
Please start the lyrica (pregabalin) with 1 today and another at bedtime if you do okay. Take it twice a day tomorrow then increase to three times a day on Friday if you are doing okay. We could consider increasing it next week if it seems to be helping

## 2019-07-21 NOTE — Assessment & Plan Note (Signed)
20# weight loss in the past 2 months Likely related to his pain, etc Will check labs

## 2019-07-21 NOTE — Telephone Encounter (Signed)
Thank you for seeing this patient.  Is he going to f/u with neurosurgery/get MRI done elsewhere or do we need to arrange for that?  Please let me know.  Thanks.

## 2019-07-21 NOTE — Progress Notes (Signed)
Subjective:    Patient ID: Greg Hughes, male    DOB: [birth year], 79 y.o.   MRN: [id number]  HPI Here with his wife due to ongoing back pain This visit occurred during the SARS-CoV-2 public health emergency.  Safety protocols were in place, including screening questions prior to the visit, additional usage of staff PPE, and extensive cleaning of exam room while observing appropriate contact time as indicated for disinfecting solutions.   Chronic back pain Spinal cord stimulator placed in January Not really helping---they are working "with the darn thing"  Had lung surgery also Resection of early cancer---did okay with this  Now "I through my hips out" Thinks this hasn't been addressed and stimulator doesn't help this Has lost 20# due to weakness and not eating Using wife's hydrocodone--didn't help Tramadol not helping--and he ran out Now awaiting referral to new pain specialist  Now can only walk with walker Legs are weak Has fallen  Current Outpatient Medications on File Prior to Visit  Medication Sig Dispense Refill  . allopurinol (ZYLOPRIM) 300 MG tablet TAKE 1 TABLET BY MOUTH ONCE A DAY 90 tablet 3  . Cholecalciferol (VITAMIN D3) 125 MCG (5000 UT) CAPS Take 10,000 Units by mouth daily.     Marland Kitchen diltiazem (CARDIZEM CD) 180 MG 24 hr capsule TAKE 1 CAPSULE BY MOUTH ONCE DAILY 90 capsule 0  . diltiazem (CARDIZEM) 30 MG tablet TAKE 1 TABLET BY MOUTH 3 TIMES A DAY AS NEEDED FOR BREAKTHROUGH ATRIAL FIB 90 tablet 3  . finasteride (PROSCAR) 5 MG tablet TAKE 1 TABLET BY MOUTH ONCE A DAY 90 tablet 1  . HYDROcodone-acetaminophen (NORCO/VICODIN) 5-325 MG tablet Take 1 tablet by mouth 3 (three) times daily as needed for moderate pain or severe pain.    . metoprolol succinate (TOPROL-XL) 50 MG 24 hr tablet TAKE 1 TABLET BY MOUTH DAILY WITH OR IMMEDIATELY FOLLOWING A MEAL 90 tablet 1  . simvastatin (ZOCOR) 10 MG tablet TAKE 1 TABLET BY MOUTH ONCE EVERY EVENING 90 tablet 3  . terazosin  (HYTRIN) 10 MG capsule TAKE 1 CAPSULE BY MOUTH EVERY NIGHT AT BEDTIME 90 capsule 3  . XARELTO 20 MG TABS tablet TAKE 1 TABLET BY MOUTH ONCE DAILY WITH SUPPER 30 tablet 5  . traMADol (ULTRAM) 50 MG tablet Take 100 mg by mouth 2 (two) times daily as needed.      No current facility-administered medications on file prior to visit.    No Known Allergies  Past Medical History:  Diagnosis Date  . A-fib ([place]) 2013  . Adenomatous colon polyp 04/2008  . BPH (benign prostatic hypertrophy)   . CAD (coronary artery disease)   . 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 Complication of anesthesia    " I fainted a couple of times when they went to get me up."  . Early cataract   . ED (erectile dysfunction)   . GERD (gastroesophageal reflux disease)   . Hernia, abdominal    small  . History of colon polyps   . Hyperlipidemia   . Hypertension   . Lumbar herniated disc   . Lung nodule   . Microscopic hematuria   . Right arm weakness 04/17/2015  . Spinal stenosis   . Tobacco abuse     Past Surgical History:  Procedure Laterality Date  . ANTERIOR CERVICAL DECOMP/DISCECTOMY FUSION N/A 06/16/2014   Procedure: ANTERIOR CERVICAL DECOMPRESSION/DISCECTOMY FUSION CERVICAL FIVE-SIX,CERVICAL SIX-SEVEN;  Surgeon: Eustace Moore, MD;  Location: [facility];  Service: Neurosurgery;  Laterality: N/A;  . BACK SURGERY    . COLONOSCOPY  2002  . INGUINAL HERNIA REPAIR    . SPINAL CORD STIMULATOR IMPLANT  2020   at [facility] clinic  . THOROCOTOMY WITH LOBECTOMY Left 2021  . TONSILLECTOMY      Family History  Problem Relation Age of Onset  . Diabetes Father   . Coronary artery disease Father   . Stroke Father   . Arthritis Mother   . Hiatal hernia Mother   . Pulmonary embolism Mother   . Colon cancer Neg Hx   . Prostate cancer Neg Hx     Social History   Socioeconomic History  . Marital status: Married    Spouse name: Not on file  . Number of children: Not on file  .  Years of education: Not on file  . Highest education level: Not on file  Occupational History  . Not on file  Tobacco Use  . Smoking status: Current Every Day Smoker    Packs/day: 1.00    Years: 60.00    Pack years: 60.00    Types: Cigarettes  . Smokeless tobacco: Never Used  Substance and Sexual Activity  . Alcohol use: No  . Drug use: No  . Sexual activity: Not on file  Other Topics Concern  . Not on file  Social History Narrative   Lives in [place].   Retired from [employer]),  Development worker, international aid   Active with [employer], [place] [employer] [place]      Married (1963, wife [name]) with 5 children    Current Smoker: about 1 pack per day, transition to e-cig   Alcohol use-no    Caffeine use/day:  2 beverages daily   Diet : regular      Moved to [place] in 2005   Social Determinants of Health   Financial Resource Strain:   . Difficulty of Paying Living Expenses:   Food Insecurity:   . Worried About Charity fundraiser in the Last Year:   . Arboriculturist in the Last Year:   Transportation Needs:   . Film/video editor (Medical):   Marland Kitchen Lack of Transportation (Non-Medical):   Physical Activity:   . Days of Exercise per Week:   . Minutes of Exercise per Session:   Stress:   . Feeling of Stress :   Social Connections:   . Frequency of Communication with Friends and Family:   . Frequency of Social Gatherings with Friends and Family:   . Attends Religious Services:   . Active Member of Clubs or Organizations:   . Attends Archivist Meetings:   Marland Kitchen Marital Status:   Intimate Partner Violence:   . Fear of Current or Ex-Partner:   . Emotionally Abused:   Marland Kitchen Physically Abused:   . Sexually Abused:    Review of Systems No sig arm pain Pain goes down both legs Tried gabapentin 2 years ago or so---didn't like it ("I felt like a dead mop")    Objective:   Physical Exam  Musculoskeletal:     Comments: Pain and some limitation of  internal rotation in left>right hip  Neurological:  Marked leg weakness--- 3/5 on left, 3+/5 on right  Psychiatric: He has a normal mood and affect. His behavior is normal.           Assessment & Plan:

## 2019-07-21 NOTE — Telephone Encounter (Signed)
Mrs Gervasi said the pt is willing to get gabapentin today because he needs some type of med today.  Larene [place] CMA cked and pt has a Good Rx card; if [facility] would transfer rx to [facility] then pt could get lyrica 25 mg # 24 tonight for $17.00 out of pocket. Mrs Hiltner voiced understanding and will contact Maria [facility].

## 2019-07-21 NOTE — Telephone Encounter (Signed)
pts wife (DPR signed ) left v/m wanting to know status of med that was to be sent in during the office visit today. I spoke with Merrily Pew at [facility] and the lyrica rx was received but is requiring a PA. Merrily Pew is sending over another PA now incase did not get first PA.I explailned to pts wife could take few days to get PA back; pts wife request cb with status of PA; would like to pick up the lyrica ASAP. FYI to [facility] CMA.

## 2019-07-22 ENCOUNTER — Telehealth: Payer: Self-pay | Admitting: *Deleted

## 2019-07-22 NOTE — Telephone Encounter (Signed)
Received fax from [facility] requesting PA for Pregabalin 25 mg.  PA completed on CoverMyMeds and sent for review.  Can take up to 72 hours for a decision.

## 2019-07-22 NOTE — Telephone Encounter (Signed)
He is waiting for a pain referral and I am trying him on the lyrica. I did not make any neurosurgical referral but thought it might be necessary if his leg weakness doesn't improve

## 2019-07-23 ENCOUNTER — Ambulatory Visit: Payer: Medicare HMO | Admitting: Internal Medicine

## 2019-07-23 NOTE — Telephone Encounter (Signed)
Patient states that the Lyrica has helped the pain some but he is still very weak.  Patient felt some better yesterday but is very weak and drowsy today.  Patient's wife has not been able to get an appointment at the pain clinic.

## 2019-07-23 NOTE — Telephone Encounter (Signed)
See below.  If patient does not improve on Lyrica, if he cannot get into the pain clinic, or if he has continued weakness then please have him update me.  Thanks.

## 2019-07-25 NOTE — Telephone Encounter (Addendum)
Please check on patient early this week, assuming they do not call back here in the meantime.  Thanks.

## 2019-07-27 ENCOUNTER — Ambulatory Visit (INDEPENDENT_AMBULATORY_CARE_PROVIDER_SITE_OTHER): Payer: Medicare HMO | Admitting: Family Medicine

## 2019-07-27 ENCOUNTER — Other Ambulatory Visit: Payer: Self-pay

## 2019-07-27 ENCOUNTER — Encounter: Payer: Self-pay | Admitting: Family Medicine

## 2019-07-27 DIAGNOSIS — M545 Low back pain, unspecified: Secondary | ICD-10-CM

## 2019-07-27 MED ORDER — PREGABALIN 25 MG PO CAPS: 25.0000 mg | ORAL_CAPSULE | Freq: Two times a day (BID) | ORAL | Status: AC

## 2019-07-27 MED ORDER — TRAMADOL HCL 50 MG PO TABS: 100.0000 mg | ORAL_TABLET | Freq: Three times a day (TID) | ORAL | 1 refills | Status: DC | PRN

## 2019-07-27 MED ORDER — PREDNISONE 20 MG PO TABS
ORAL_TABLET | ORAL | 0 refills | Status: DC
Start: 2019-07-27 — End: 2019-08-09

## 2019-07-27 NOTE — Progress Notes (Signed)
This visit occurred during the SARS-CoV-2 public health emergency.  Safety protocols were in place, including screening questions prior to the visit, additional usage of staff PPE, and extensive cleaning of exam room while observing appropriate contact time as indicated for disinfecting solutions.  Chronic back pain Spinal cord stimulator placed previously. He is going to have more adjustments on his stimulator/back clinic f/u.   Had lung surgery also, resection of early cancer.   New back/leg/hip in the last month or so.  "It started in the hips." no trauma or trigger.  Today with L>R sided pain, sometimes worse on the R.  Pain is episodic, positional.  More pain rolling over in bed "if I"m in the wrong position." using a walker at baseline; his gait is abnormal compared to prev with "left leg giving out."  He has fallen at least 3 times.  Taking lyrica 25mg  TID without a sig change in pain.  Hydrocodone does not up his pain.  Tramadol does seem to help some.  Meds, vitals, and allergies reviewed.   ROS: Per HPI unless specifically indicated in ROS section   nad Sitting in wheelchair ncat Speech normal Neck supple no LA rrr ctab abd soft, not ttp His sensation is intact in the bilateral lower extremities but he has weaker bilateral foot and first toe dorsiflexion.  Plantar flexion appears normal bilaterally.  Intact dorsalis pedis pulses.  He has pain on active left hip range of motion but significantly less pain with passive left hip range of motion.  Previous hip x-rays did not show any significant new findings.  He had mild left and moderate right hip degenerative changes.  Abrasion dorsum R and and forearm.

## 2019-07-27 NOTE — Patient Instructions (Signed)
Take tramadol 3 times a day if needed.  Sedation caution.   Stay off hydrocodone since that didn't help much.   Prednisone with food.  Avoid aleve or ibuprofen.   Taper lyrica to 25mg  twice a day.  If possible, cut back to once a day (after about 5 days).  Then stop if possible.   I'll check with [facility] about options.   Take care.  Glad to see you.

## 2019-07-27 NOTE — Telephone Encounter (Signed)
Patient has an appointment to be seen in the clinic today.

## 2019-07-28 ENCOUNTER — Emergency Department (HOSPITAL_COMMUNITY): Payer: Medicare HMO

## 2019-07-28 ENCOUNTER — Telehealth: Payer: Self-pay | Admitting: *Deleted

## 2019-07-28 ENCOUNTER — Inpatient Hospital Stay (HOSPITAL_COMMUNITY)
Admission: EM | Admit: 2019-07-28 | Discharge: 2019-08-09 | DRG: 522 | Disposition: A | Discharge status: Home Health | Payer: Medicare HMO | Attending: Internal Medicine | Admitting: Internal Medicine

## 2019-07-28 ENCOUNTER — Encounter (HOSPITAL_COMMUNITY): Payer: Self-pay

## 2019-07-28 ENCOUNTER — Other Ambulatory Visit: Payer: Self-pay

## 2019-07-28 DIAGNOSIS — M109 Gout, unspecified: Secondary | ICD-10-CM | POA: Diagnosis present

## 2019-07-28 DIAGNOSIS — Z9682 Presence of neurostimulator: Secondary | ICD-10-CM

## 2019-07-28 DIAGNOSIS — M5459 Other low back pain: Secondary | ICD-10-CM

## 2019-07-28 DIAGNOSIS — M5442 Lumbago with sciatica, left side: Secondary | ICD-10-CM | POA: Diagnosis present

## 2019-07-28 DIAGNOSIS — Z66 Do not resuscitate: Secondary | ICD-10-CM | POA: Diagnosis present

## 2019-07-28 DIAGNOSIS — R0902 Hypoxemia: Secondary | ICD-10-CM | POA: Diagnosis not present

## 2019-07-28 DIAGNOSIS — R262 Difficulty in walking, not elsewhere classified: Secondary | ICD-10-CM | POA: Diagnosis present

## 2019-07-28 DIAGNOSIS — Z20822 Contact with and (suspected) exposure to covid-19: Secondary | ICD-10-CM | POA: Diagnosis present

## 2019-07-28 DIAGNOSIS — C787 Secondary malignant neoplasm of liver and intrahepatic bile duct: Secondary | ICD-10-CM | POA: Diagnosis present

## 2019-07-28 DIAGNOSIS — M84452A Pathological fracture, left femur, initial encounter for fracture: Secondary | ICD-10-CM

## 2019-07-28 DIAGNOSIS — I5032 Chronic diastolic (congestive) heart failure: Secondary | ICD-10-CM | POA: Diagnosis present

## 2019-07-28 DIAGNOSIS — R202 Paresthesia of skin: Secondary | ICD-10-CM | POA: Diagnosis not present

## 2019-07-28 DIAGNOSIS — Z8719 Personal history of other diseases of the digestive system: Secondary | ICD-10-CM

## 2019-07-28 DIAGNOSIS — Z7401 Bed confinement status: Secondary | ICD-10-CM

## 2019-07-28 DIAGNOSIS — M546 Pain in thoracic spine: Secondary | ICD-10-CM | POA: Diagnosis not present

## 2019-07-28 DIAGNOSIS — J449 Chronic obstructive pulmonary disease, unspecified: Secondary | ICD-10-CM

## 2019-07-28 DIAGNOSIS — R52 Pain, unspecified: Secondary | ICD-10-CM | POA: Diagnosis not present

## 2019-07-28 DIAGNOSIS — Z03818 Encounter for observation for suspected exposure to other biological agents ruled out: Secondary | ICD-10-CM | POA: Diagnosis not present

## 2019-07-28 DIAGNOSIS — F17210 Nicotine dependence, cigarettes, uncomplicated: Secondary | ICD-10-CM | POA: Diagnosis present

## 2019-07-28 DIAGNOSIS — R339 Retention of urine, unspecified: Secondary | ICD-10-CM | POA: Diagnosis not present

## 2019-07-28 DIAGNOSIS — G4733 Obstructive sleep apnea (adult) (pediatric): Secondary | ICD-10-CM | POA: Diagnosis present

## 2019-07-28 DIAGNOSIS — I2510 Atherosclerotic heart disease of native coronary artery without angina pectoris: Secondary | ICD-10-CM | POA: Diagnosis present

## 2019-07-28 DIAGNOSIS — Z96642 Presence of left artificial hip joint: Secondary | ICD-10-CM | POA: Diagnosis present

## 2019-07-28 DIAGNOSIS — Z7901 Long term (current) use of anticoagulants: Secondary | ICD-10-CM

## 2019-07-28 DIAGNOSIS — M5126 Other intervertebral disc displacement, lumbar region: Secondary | ICD-10-CM | POA: Diagnosis not present

## 2019-07-28 DIAGNOSIS — M84552A Pathological fracture in neoplastic disease, left femur, initial encounter for fracture: Principal | ICD-10-CM | POA: Diagnosis present

## 2019-07-28 DIAGNOSIS — C3432 Malignant neoplasm of lower lobe, left bronchus or lung: Secondary | ICD-10-CM | POA: Diagnosis present

## 2019-07-28 DIAGNOSIS — E876 Hypokalemia: Secondary | ICD-10-CM | POA: Diagnosis present

## 2019-07-28 DIAGNOSIS — Z86010 Personal history of colonic polyps: Secondary | ICD-10-CM

## 2019-07-28 DIAGNOSIS — S7292XA Unspecified fracture of left femur, initial encounter for closed fracture: Secondary | ICD-10-CM

## 2019-07-28 DIAGNOSIS — M5489 Other dorsalgia: Secondary | ICD-10-CM | POA: Diagnosis not present

## 2019-07-28 DIAGNOSIS — I10 Essential (primary) hypertension: Secondary | ICD-10-CM | POA: Diagnosis present

## 2019-07-28 DIAGNOSIS — Z09 Encounter for follow-up examination after completed treatment for conditions other than malignant neoplasm: Secondary | ICD-10-CM

## 2019-07-28 DIAGNOSIS — Z85820 Personal history of malignant melanoma of skin: Secondary | ICD-10-CM

## 2019-07-28 DIAGNOSIS — C7951 Secondary malignant neoplasm of bone: Secondary | ICD-10-CM | POA: Diagnosis present

## 2019-07-28 DIAGNOSIS — G893 Neoplasm related pain (acute) (chronic): Secondary | ICD-10-CM | POA: Diagnosis present

## 2019-07-28 DIAGNOSIS — M84559A Pathological fracture in neoplastic disease, hip, unspecified, initial encounter for fracture: Secondary | ICD-10-CM | POA: Diagnosis present

## 2019-07-28 DIAGNOSIS — E782 Mixed hyperlipidemia: Secondary | ICD-10-CM | POA: Diagnosis present

## 2019-07-28 DIAGNOSIS — T380X5A Adverse effect of glucocorticoids and synthetic analogues, initial encounter: Secondary | ICD-10-CM | POA: Diagnosis present

## 2019-07-28 DIAGNOSIS — K5900 Constipation, unspecified: Secondary | ICD-10-CM | POA: Diagnosis present

## 2019-07-28 DIAGNOSIS — Z408 Encounter for other prophylactic surgery: Secondary | ICD-10-CM | POA: Diagnosis present

## 2019-07-28 DIAGNOSIS — Z8249 Family history of ischemic heart disease and other diseases of the circulatory system: Secondary | ICD-10-CM

## 2019-07-28 DIAGNOSIS — Z833 Family history of diabetes mellitus: Secondary | ICD-10-CM

## 2019-07-28 DIAGNOSIS — Z515 Encounter for palliative care: Secondary | ICD-10-CM

## 2019-07-28 DIAGNOSIS — I110 Hypertensive heart disease with heart failure: Secondary | ICD-10-CM | POA: Diagnosis present

## 2019-07-28 DIAGNOSIS — Z79899 Other long term (current) drug therapy: Secondary | ICD-10-CM

## 2019-07-28 DIAGNOSIS — Z419 Encounter for procedure for purposes other than remedying health state, unspecified: Secondary | ICD-10-CM

## 2019-07-28 DIAGNOSIS — Z823 Family history of stroke: Secondary | ICD-10-CM

## 2019-07-28 DIAGNOSIS — K219 Gastro-esophageal reflux disease without esophagitis: Secondary | ICD-10-CM | POA: Diagnosis present

## 2019-07-28 DIAGNOSIS — I480 Paroxysmal atrial fibrillation: Secondary | ICD-10-CM | POA: Diagnosis present

## 2019-07-28 DIAGNOSIS — Z8261 Family history of arthritis: Secondary | ICD-10-CM

## 2019-07-28 DIAGNOSIS — D72829 Elevated white blood cell count, unspecified: Secondary | ICD-10-CM | POA: Diagnosis present

## 2019-07-28 DIAGNOSIS — N401 Enlarged prostate with lower urinary tract symptoms: Secondary | ICD-10-CM | POA: Diagnosis present

## 2019-07-28 DIAGNOSIS — Z981 Arthrodesis status: Secondary | ICD-10-CM

## 2019-07-28 DIAGNOSIS — Z993 Dependence on wheelchair: Secondary | ICD-10-CM

## 2019-07-28 DIAGNOSIS — Z7189 Other specified counseling: Secondary | ICD-10-CM

## 2019-07-28 LAB — CBC WITH DIFFERENTIAL/PLATELET
Abs Immature Granulocytes: 0.06 10*3/uL (ref 0.00–0.07)
Basophils Absolute: 0 10*3/uL (ref 0.0–0.1)
Basophils Relative: 0 %
Eosinophils Absolute: 0.1 10*3/uL (ref 0.0–0.5)
Eosinophils Relative: 1 %
HCT: 42 % (ref 39.0–52.0)
Hemoglobin: 14.1 g/dL (ref 13.0–17.0)
Immature Granulocytes: 0 %
Lymphocytes Relative: 25 %
Lymphs Abs: 4.1 10*3/uL — ABNORMAL HIGH (ref 0.7–4.0)
MCH: 30.2 pg (ref 26.0–34.0)
MCHC: 33.6 g/dL (ref 30.0–36.0)
MCV: 89.9 fL (ref 80.0–100.0)
Monocytes Absolute: 1.2 10*3/uL — ABNORMAL HIGH (ref 0.1–1.0)
Monocytes Relative: 8 %
Neutro Abs: 10.6 10*3/uL — ABNORMAL HIGH (ref 1.7–7.7)
Neutrophils Relative %: 66 %
Platelets: 262 10*3/uL (ref 150–400)
RBC: 4.67 MIL/uL (ref 4.22–5.81)
RDW: 13.8 % (ref 11.5–15.5)
WBC: 16 10*3/uL — ABNORMAL HIGH (ref 4.0–10.5)
nRBC: 0 % (ref 0.0–0.2)

## 2019-07-28 LAB — COMPREHENSIVE METABOLIC PANEL
ALT: 39 U/L (ref 0–44)
AST: 33 U/L (ref 15–41)
Albumin: 3.3 g/dL — ABNORMAL LOW (ref 3.5–5.0)
Alkaline Phosphatase: 117 U/L (ref 38–126)
Anion gap: 12 (ref 5–15)
BUN: 20 mg/dL (ref 8–23)
CO2: 26 mmol/L (ref 22–32)
Calcium: 10.9 mg/dL — ABNORMAL HIGH (ref 8.9–10.3)
Chloride: 100 mmol/L (ref 98–111)
Creatinine, Ser: 1.09 mg/dL (ref 0.61–1.24)
GFR calc Af Amer: >60 mL/min (ref >60)
GFR calc non Af Amer: >60 mL/min (ref >60)
Glucose, Bld: 107 mg/dL — ABNORMAL HIGH (ref 70–99)
Potassium: 3.4 mmol/L — ABNORMAL LOW (ref 3.5–5.1)
Sodium: 138 mmol/L (ref 135–145)
Total Bilirubin: 0.9 mg/dL (ref 0.3–1.2)
Total Protein: 7.4 g/dL (ref 6.5–8.1)

## 2019-07-28 LAB — PROTIME-INR
INR: 1.3 — ABNORMAL HIGH (ref 0.8–1.2)
Prothrombin Time: 15.6 seconds — ABNORMAL HIGH (ref 11.4–15.2)

## 2019-07-28 MED ORDER — MORPHINE SULFATE (PF) 4 MG/ML IV SOLN
4.0000 mg | Freq: Once | INTRAVENOUS | Status: AC
  Administered 2019-07-28: 4 mg via INTRAVENOUS

## 2019-07-28 MED ORDER — MORPHINE SULFATE (PF) 4 MG/ML IV SOLN
4.0000 mg | INTRAVENOUS | Status: DC | PRN
  Administered 2019-07-28: 2 mg via INTRAVENOUS
  Administered 2019-07-29: 4 mg via INTRAVENOUS
  Filled 2019-07-28 (×4): qty 1

## 2019-07-28 MED ORDER — BACLOFEN 10 MG PO TABS: 5.0000 mg | ORAL_TABLET | Freq: Two times a day (BID) | ORAL | 0 refills | Status: DC | PRN

## 2019-07-28 MED ORDER — TIZANIDINE HCL 4 MG PO TABS
2.0000 mg | ORAL_TABLET | Freq: Once | ORAL | Status: AC
  Administered 2019-07-28: 2 mg via ORAL
  Filled 2019-07-28: qty 1

## 2019-07-28 MED ORDER — MORPHINE SULFATE (PF) 4 MG/ML IV SOLN
4.0000 mg | Freq: Once | INTRAVENOUS | Status: AC
  Administered 2019-07-28: 4 mg via INTRAVENOUS
  Filled 2019-07-28: qty 1

## 2019-07-28 NOTE — Progress Notes (Signed)
RN called and stated patient has his stimulator remote, but patient stated that his stimulator info card was in his wallet and his wife has his wallet. Wife has left to go eat and did not answer her phone after multiple attempts to reach her. RN to call back once the wife gets back.

## 2019-07-28 NOTE — ED Provider Notes (Signed)
[facility] EMERGENCY DEPARTMENT Provider Note   CSN: [id number] Arrival date & time: 07/28/19  1729     History Chief Complaint  Patient presents with  . Hip Pain    ALI MOHL is a 79 y.o. male.  The history is provided by the patient and medical records. No language interpreter was used.  Hip Pain   LEXIE MORINI is a 79 y.o. male who presents to the Emergency Department complaining of hip pain and back pain. He presents the emergency department by EMS accompanied by his wife for evaluation of acute on chronic back and hip pain. He does have a history of chronic back pain and has a spinal cord stimulator that was placed earlier this year by [facility]. Over the last four weeks he is experienced increased pain in his lower back that radiates to his hips and he is experienced difficulty walking secondary to hip pain. One month ago he could ambulate without any assistive devices. Two weeks ago he required a cane to assist in ambulation. A week and a half ago he required a walker to ambulate. Over the last few days he has been unable to ambulate at all. He requires assistance from his wife to turn over in bed. Pain is described as severe in nature. It radiates to the hips bilaterally, left greater than right. He has significant pain and difficulty arranging his left leg and thigh. He denies any numbness, weakness, chest pain, fevers, shortness of breath, nausea, vomiting, diarrhea. He does have constipation. No loss of bowel or bladder. He has a history of atrial fibrillation and takes Xarelto. He did have a fall about a month ago but none recently. He has been trying tramadol at home for the pain with no significant change in symptoms. He was started on prednisone yesterday.    Past Medical History:  Diagnosis Date  . A-fib ([place]) 2013  . Adenomatous colon polyp 04/2008  . BPH (benign prostatic hypertrophy)   . CAD (coronary artery disease)   . Cancer (HCC)     melanoma of the neck, local excision, no chemo  . Complication of anesthesia    " I fainted a couple of times when they went to get me up."  . Early cataract   . ED (erectile dysfunction)   . GERD (gastroesophageal reflux disease)   . Hernia, abdominal    small  . History of colon polyps   . Hyperlipidemia   . Hypertension   . Lumbar herniated disc   . Lung nodule   . Microscopic hematuria   . Right arm weakness 04/17/2015  . Spinal stenosis   . Tobacco abuse     Patient Active Problem List   Diagnosis Date Noted  . Malnutrition of mild degree ([place]) 07/21/2019  . Lumbar back pain with radiculopathy affecting lower extremity 07/21/2019  . Abnormal CBC 10/15/2018  . Health care maintenance 10/03/2017  . PSA elevation 10/03/2017  . History of melanoma   . Advance care planning 06/08/2017  . Benign fibroma of prostate 04/17/2015  . Encounter for anticoagulation discussion and counseling 03/09/2015  . S/P cervical spinal fusion 06/16/2014  . Aortic atherosclerosis ([place]) 05/16/2014  . Adrenal adenoma 04/21/2014  . Cystic disease of kidney 03/03/2014  . Spinal stenosis of lumbar region without neurogenic claudication 10/07/2013  . Lung nodules 01/05/2013  . Medicare annual wellness visit, subsequent 09/30/2012  . Gout 09/30/2012  . Atrial fibrillation ([place]) 11/18/2011  . COPD 04/30/2010  . ERECTILE  DYSFUNCTION 10/20/2007  . Benign prostatic hyperplasia with urinary obstruction 08/09/2007  . TOBACCO ABUSE 02/10/2007  . Microscopic hematuria 02/10/2007  . BACK PAIN, LUMBAR, CHRONIC 02/10/2007  . HLD (hyperlipidemia) 02/05/2007  . Essential hypertension 02/05/2007    Past Surgical History:  Procedure Laterality Date  . ANTERIOR CERVICAL DECOMP/DISCECTOMY FUSION N/A 06/16/2014   Procedure: ANTERIOR CERVICAL DECOMPRESSION/DISCECTOMY FUSION CERVICAL FIVE-SIX,CERVICAL SIX-SEVEN;  Surgeon: Eustace Moore, MD;  Location: [facility];  Service: Neurosurgery;  Laterality: N/A;  .  BACK SURGERY    . COLONOSCOPY  2002  . INGUINAL HERNIA REPAIR    . SPINAL CORD STIMULATOR IMPLANT  2020   at [facility] clinic  . THOROCOTOMY WITH LOBECTOMY Left 2021  . TONSILLECTOMY         Family History  Problem Relation Age of Onset  . Diabetes Father   . Coronary artery disease Father   . Stroke Father   . Arthritis Mother   . Hiatal hernia Mother   . Pulmonary embolism Mother   . Colon cancer Neg Hx   . Prostate cancer Neg Hx     Social History   Tobacco Use  . Smoking status: Current Every Day Smoker    Packs/day: 1.00    Years: 60.00    Pack years: 60.00    Types: Cigarettes  . Smokeless tobacco: Never Used  Substance Use Topics  . Alcohol use: No  . Drug use: No    Home Medications Prior to Admission medications   Medication Sig Start Date End Date Taking? Authorizing Provider  allopurinol (ZYLOPRIM) 300 MG tablet TAKE 1 TABLET BY MOUTH ONCE A DAY Patient taking differently: Take 300 mg by mouth daily.  10/02/18  Yes Tonia Ghent, MD  diltiazem (CARDIZEM CD) 180 MG 24 hr capsule TAKE 1 CAPSULE BY MOUTH ONCE DAILY Patient taking differently: Take 180 mg by mouth daily.  03/01/19  Yes Gollan, Kathlene November, MD  diltiazem (CARDIZEM) 30 MG tablet TAKE 1 TABLET BY MOUTH 3 TIMES A DAY AS NEEDED FOR BREAKTHROUGH ATRIAL FIB Patient taking differently: Take 30 mg by mouth 3 (three) times daily as needed (BREAKTHROUGH ATRIAL FIB).  05/11/18  Yes Gollan, Kathlene November, MD  finasteride (PROSCAR) 5 MG tablet TAKE 1 TABLET BY MOUTH ONCE A DAY Patient taking differently: Take 5 mg by mouth daily.  04/26/19  Yes Tonia Ghent, MD  metoprolol succinate (TOPROL-XL) 50 MG 24 hr tablet TAKE 1 TABLET BY MOUTH DAILY WITH OR IMMEDIATELY FOLLOWING A MEAL Patient taking differently: Take 50 mg by mouth daily.  04/01/19  Yes Gollan, Kathlene November, MD  predniSONE (DELTASONE) 20 MG tablet Take 2 a day for 5 days, then 1 a day for 5 days, with food. Don't take with aleve/ibuprofen. Patient taking  differently: Take 20 mg by mouth See admin instructions. Take 2 a day for 5 days, then 1 a day for 5 days, with food. Don't take with aleve/ibuprofen. 07/27/19  Yes Tonia Ghent, MD  pregabalin (LYRICA) 25 MG capsule Take 1 capsule (25 mg total) by mouth 2 (two) times daily. 07/27/19  Yes Tonia Ghent, MD  simvastatin (ZOCOR) 10 MG tablet TAKE 1 TABLET BY MOUTH ONCE EVERY EVENING Patient taking differently: Take 10 mg by mouth daily at 6 PM.  10/02/18  Yes Tonia Ghent, MD  tamsulosin (FLOMAX) 0.4 MG CAPS capsule Take 0.8 mg by mouth daily. 07/12/19  Yes [provider]  terazosin (HYTRIN) 10 MG capsule TAKE 1 CAPSULE BY MOUTH  EVERY NIGHT AT BEDTIME Patient taking differently: Take 10 mg by mouth at bedtime.  10/02/18  Yes Tonia Ghent, MD  traMADol (ULTRAM) 50 MG tablet Take 2 tablets (100 mg total) by mouth 3 (three) times daily as needed (for pain.  sedation caution.). 07/27/19  Yes Tonia Ghent, MD  XARELTO 20 MG TABS tablet TAKE 1 TABLET BY MOUTH ONCE DAILY WITH SUPPER Patient taking differently: Take 20 mg by mouth daily with supper.  06/29/19  Yes Gollan, Kathlene November, MD  baclofen (LIORESAL) 10 MG tablet Take 0.5-1 tablets (5-10 mg total) by mouth 2 (two) times daily as needed for muscle spasms. 07/28/19   Tonia Ghent, MD    Allergies    Patient has no known allergies.  Review of Systems   Review of Systems  All other systems reviewed and are negative.   Physical Exam Updated Vital Signs BP (!) 169/89 (BP Location: Right Arm)   Pulse 67   Temp 97.6 F (36.4 C) (Oral)   Resp 10   Wt 79.4 kg   SpO2 98%   BMI 25.11 kg/m   Physical Exam Vitals and nursing note reviewed.  Constitutional:      Appearance: He is well-developed.  HENT:     Head: Normocephalic and atraumatic.  Cardiovascular:     Rate and Rhythm: Normal rate and regular rhythm.     Heart sounds: No murmur.  Pulmonary:     Effort: Pulmonary effort is normal. No respiratory distress.      Breath sounds: Normal breath sounds.  Abdominal:     Palpations: Abdomen is soft.     Tenderness: There is no abdominal tenderness. There is no guarding or rebound.  Musculoskeletal:     Comments: 2+ DP pulses bilaterally.  Skin:    General: Skin is warm and dry.  Neurological:     Mental Status: He is alert and oriented to person, place, and time.     Comments: Five out of five strength and bilateral upper extremities. 2 to 3/5 strength in the proximal left lower extremity. Five out of five strength in the distal left lower extremity. Five out of five strength in the right lower extremity. Sensation light touch is intact throughout bilateral lower extremities.  Psychiatric:        Behavior: Behavior normal.     ED Results / Procedures / Treatments   Labs (all labs ordered are listed, but only abnormal results are displayed) Labs Reviewed  COMPREHENSIVE METABOLIC PANEL  CBC WITH DIFFERENTIAL/PLATELET  PROTIME-INR    EKG EKG Interpretation  Date/Time:  Wednesday July 28 2019 17:37:08 EDT Ventricular Rate:  69 PR Interval:    QRS Duration: 112 QT Interval:  475 QTC Calculation: 509 R Axis:   47 Text Interpretation: Sinus rhythm Prolonged PR interval Abnormal R-wave progression, early transition Inferior infarct, old Prolonged QT interval Baseline wander in lead(s) V6 Confirmed by Quintella Reichert [phone]) on 07/28/2019 6:19:11 PM   Radiology No results found.  Procedures Procedures (including critical care time)  Medications Ordered in ED Medications  morphine 4 MG/ML injection 4 mg (has no administration in time range)  morphine 4 MG/ML injection 4 mg (4 mg Intravenous Given 07/28/19 1829)  tiZANidine (ZANAFLEX) tablet 2 mg (2 mg Oral Given 07/28/19 1832)    ED Course  I have reviewed the triage vital signs and the nursing notes.  Pertinent labs & imaging results that were available during my care of the patient were reviewed by me and  considered in my medical decision  making (see chart for details).    MDM Rules/Calculators/A&P                     Patient with history of chronic back pain here for evaluation of acute flare of his pain with decreased mobility and strength to the left lower extremity. Patient was significant discomfort requiring multiple doses of pain medication to the emergency department. He does have weakness to the proximal left lower extremity. Attempted to obtain MRI of the thoracic and lumbar spine to rule out nerve entrapment. Due to patient's spinal cord stimulator he is only able to obtain 30 minutes of imaging at a time. Initial images of lumbar spine without any evidence of acute entrapment. Given patient's ongoing pain and need for further workup medicine consulted for admission.  Final Clinical Impression(s) / ED Diagnoses Final diagnoses:  None    Rx / DC Orders ED Discharge Orders    None       Quintella Reichert, MD 07/29/19 [phone]

## 2019-07-28 NOTE — Telephone Encounter (Signed)
Patient's wife left a voicemail stating that he was seen yesterday by Dr. Damita Dunnings. Patient's wife stated that he was seen for left leg pain. Mrs. Rabalais stated that she noticed today that the muscle behind his left knee goes into spasms, gets real tight and she can see the core of the muscle when this happens. Patient's wife wants to know if he should do anything different?

## 2019-07-28 NOTE — ED Notes (Signed)
Back from MRI.

## 2019-07-28 NOTE — ED Notes (Signed)
Pt went to MRI.

## 2019-07-28 NOTE — Telephone Encounter (Signed)
Noted.  I will await the ER notes.  Thanks.

## 2019-07-28 NOTE — Telephone Encounter (Signed)
I am not in the clinic today but I have been working on his case.  I have been on the phone with [facility] today try to get follow up set there and coordinate his imaging.  I hope to hear back from them tomorrow.    We can try a muscle relaxer for his leg with sedation caution.  If that doesn't help, then he'll need extra eval tonight.  Please notify them that I sent the rx and that I was in touch with [facility] today.  I hope he feels better soon. Thanks.

## 2019-07-28 NOTE — Telephone Encounter (Signed)
Spoke to patient's wife and was advised that her husband is on the way to [facility] by EMS. Mrs. Wiebelhaus stated that his pain got so bad that he needed help now.

## 2019-07-28 NOTE — ED Triage Notes (Signed)
Pt bib ems, reporting that the pt has had chronic back pain now radiating to the hips. Tingling pain down the left leg. Pt is becoming more wheelchair dependant over the last few days.   72 hr 161/90 98%ra 16rr

## 2019-07-28 NOTE — Progress Notes (Signed)
Patient came down to MRI. Patient stated he has a spinal cord stimulator and he does not have his remote with him. He said it was at home. Need more info about what type of stimulator it is and also told patient the remote needs to present so we can witness the stimulator being turned off before his MRI. RN made aware.

## 2019-07-28 NOTE — Telephone Encounter (Signed)
Pt's wife called back asking why no one has called back. I advised her that Dr Damita Dunnings is out of the office on Wednesdays. He is taking the tramadol and prednisone and Lyrica with no relief. She said he was in so much pain he is about to fall out of chair. I advised her if he was in that much pain, he would need to to to an ER for pain management. We do not have anything in office for pain nor does an UC. She will take him to a [facility] facility.

## 2019-07-29 ENCOUNTER — Emergency Department (HOSPITAL_COMMUNITY): Payer: Medicare HMO

## 2019-07-29 ENCOUNTER — Observation Stay (HOSPITAL_COMMUNITY): Payer: Medicare HMO

## 2019-07-29 ENCOUNTER — Inpatient Hospital Stay (HOSPITAL_COMMUNITY): Payer: Medicare HMO

## 2019-07-29 ENCOUNTER — Encounter (HOSPITAL_COMMUNITY): Payer: Self-pay | Admitting: Internal Medicine

## 2019-07-29 DIAGNOSIS — M84552A Pathological fracture in neoplastic disease, left femur, initial encounter for fracture: Secondary | ICD-10-CM | POA: Diagnosis not present

## 2019-07-29 DIAGNOSIS — C3491 Malignant neoplasm of unspecified part of right bronchus or lung: Secondary | ICD-10-CM | POA: Diagnosis not present

## 2019-07-29 DIAGNOSIS — E782 Mixed hyperlipidemia: Secondary | ICD-10-CM

## 2019-07-29 DIAGNOSIS — R262 Difficulty in walking, not elsewhere classified: Secondary | ICD-10-CM | POA: Diagnosis present

## 2019-07-29 DIAGNOSIS — K5900 Constipation, unspecified: Secondary | ICD-10-CM | POA: Diagnosis present

## 2019-07-29 DIAGNOSIS — N401 Enlarged prostate with lower urinary tract symptoms: Secondary | ICD-10-CM | POA: Diagnosis not present

## 2019-07-29 DIAGNOSIS — S72142A Displaced intertrochanteric fracture of left femur, initial encounter for closed fracture: Secondary | ICD-10-CM | POA: Diagnosis not present

## 2019-07-29 DIAGNOSIS — R339 Retention of urine, unspecified: Secondary | ICD-10-CM | POA: Diagnosis not present

## 2019-07-29 DIAGNOSIS — Z408 Encounter for other prophylactic surgery: Secondary | ICD-10-CM | POA: Diagnosis present

## 2019-07-29 DIAGNOSIS — M25552 Pain in left hip: Secondary | ICD-10-CM | POA: Diagnosis not present

## 2019-07-29 DIAGNOSIS — Z7189 Other specified counseling: Secondary | ICD-10-CM | POA: Diagnosis not present

## 2019-07-29 DIAGNOSIS — G4733 Obstructive sleep apnea (adult) (pediatric): Secondary | ICD-10-CM | POA: Diagnosis not present

## 2019-07-29 DIAGNOSIS — I2510 Atherosclerotic heart disease of native coronary artery without angina pectoris: Secondary | ICD-10-CM | POA: Diagnosis not present

## 2019-07-29 DIAGNOSIS — M5442 Lumbago with sciatica, left side: Secondary | ICD-10-CM | POA: Diagnosis not present

## 2019-07-29 DIAGNOSIS — Z471 Aftercare following joint replacement surgery: Secondary | ICD-10-CM | POA: Diagnosis not present

## 2019-07-29 DIAGNOSIS — C801 Malignant (primary) neoplasm, unspecified: Secondary | ICD-10-CM | POA: Diagnosis not present

## 2019-07-29 DIAGNOSIS — M84451A Pathological fracture, right femur, initial encounter for fracture: Secondary | ICD-10-CM | POA: Diagnosis not present

## 2019-07-29 DIAGNOSIS — I5032 Chronic diastolic (congestive) heart failure: Secondary | ICD-10-CM | POA: Diagnosis present

## 2019-07-29 DIAGNOSIS — G893 Neoplasm related pain (acute) (chronic): Secondary | ICD-10-CM | POA: Diagnosis not present

## 2019-07-29 DIAGNOSIS — R69 Illness, unspecified: Secondary | ICD-10-CM | POA: Diagnosis not present

## 2019-07-29 DIAGNOSIS — K4090 Unilateral inguinal hernia, without obstruction or gangrene, not specified as recurrent: Secondary | ICD-10-CM | POA: Diagnosis not present

## 2019-07-29 DIAGNOSIS — C787 Secondary malignant neoplasm of liver and intrahepatic bile duct: Secondary | ICD-10-CM | POA: Diagnosis not present

## 2019-07-29 DIAGNOSIS — S7291XA Unspecified fracture of right femur, initial encounter for closed fracture: Secondary | ICD-10-CM | POA: Diagnosis not present

## 2019-07-29 DIAGNOSIS — E876 Hypokalemia: Secondary | ICD-10-CM | POA: Diagnosis present

## 2019-07-29 DIAGNOSIS — Z20822 Contact with and (suspected) exposure to covid-19: Secondary | ICD-10-CM | POA: Diagnosis not present

## 2019-07-29 DIAGNOSIS — Z66 Do not resuscitate: Secondary | ICD-10-CM | POA: Diagnosis not present

## 2019-07-29 DIAGNOSIS — J449 Chronic obstructive pulmonary disease, unspecified: Secondary | ICD-10-CM | POA: Diagnosis not present

## 2019-07-29 DIAGNOSIS — M545 Low back pain: Secondary | ICD-10-CM | POA: Diagnosis not present

## 2019-07-29 DIAGNOSIS — C7951 Secondary malignant neoplasm of bone: Secondary | ICD-10-CM | POA: Diagnosis not present

## 2019-07-29 DIAGNOSIS — Z7901 Long term (current) use of anticoagulants: Secondary | ICD-10-CM | POA: Diagnosis not present

## 2019-07-29 DIAGNOSIS — M84559A Pathological fracture in neoplastic disease, hip, unspecified, initial encounter for fracture: Secondary | ICD-10-CM | POA: Diagnosis present

## 2019-07-29 DIAGNOSIS — M109 Gout, unspecified: Secondary | ICD-10-CM | POA: Diagnosis present

## 2019-07-29 DIAGNOSIS — J90 Pleural effusion, not elsewhere classified: Secondary | ICD-10-CM | POA: Diagnosis not present

## 2019-07-29 DIAGNOSIS — S72002A Fracture of unspecified part of neck of left femur, initial encounter for closed fracture: Secondary | ICD-10-CM | POA: Diagnosis not present

## 2019-07-29 DIAGNOSIS — Z96642 Presence of left artificial hip joint: Secondary | ICD-10-CM | POA: Diagnosis not present

## 2019-07-29 DIAGNOSIS — R5381 Other malaise: Secondary | ICD-10-CM | POA: Diagnosis not present

## 2019-07-29 DIAGNOSIS — S7292XA Unspecified fracture of left femur, initial encounter for closed fracture: Secondary | ICD-10-CM | POA: Diagnosis present

## 2019-07-29 DIAGNOSIS — I10 Essential (primary) hypertension: Secondary | ICD-10-CM | POA: Diagnosis not present

## 2019-07-29 DIAGNOSIS — M2550 Pain in unspecified joint: Secondary | ICD-10-CM | POA: Diagnosis not present

## 2019-07-29 DIAGNOSIS — Z515 Encounter for palliative care: Secondary | ICD-10-CM | POA: Diagnosis not present

## 2019-07-29 DIAGNOSIS — I480 Paroxysmal atrial fibrillation: Secondary | ICD-10-CM | POA: Diagnosis not present

## 2019-07-29 DIAGNOSIS — Z7401 Bed confinement status: Secondary | ICD-10-CM | POA: Diagnosis not present

## 2019-07-29 DIAGNOSIS — I110 Hypertensive heart disease with heart failure: Secondary | ICD-10-CM | POA: Diagnosis not present

## 2019-07-29 DIAGNOSIS — C3432 Malignant neoplasm of lower lobe, left bronchus or lung: Secondary | ICD-10-CM | POA: Diagnosis not present

## 2019-07-29 DIAGNOSIS — M84452A Pathological fracture, left femur, initial encounter for fracture: Secondary | ICD-10-CM | POA: Diagnosis not present

## 2019-07-29 LAB — CBC WITH DIFFERENTIAL/PLATELET
Abs Immature Granulocytes: 0.07 10*3/uL (ref 0.00–0.07)
Basophils Absolute: 0.1 10*3/uL (ref 0.0–0.1)
Basophils Relative: 0 %
Eosinophils Absolute: 0.1 10*3/uL (ref 0.0–0.5)
Eosinophils Relative: 0 %
HCT: 43 % (ref 39.0–52.0)
Hemoglobin: 14.5 g/dL (ref 13.0–17.0)
Immature Granulocytes: 1 %
Lymphocytes Relative: 19 %
Lymphs Abs: 2.7 10*3/uL (ref 0.7–4.0)
MCH: 30.7 pg (ref 26.0–34.0)
MCHC: 33.7 g/dL (ref 30.0–36.0)
MCV: 91.1 fL (ref 80.0–100.0)
Monocytes Absolute: 1.1 10*3/uL — ABNORMAL HIGH (ref 0.1–1.0)
Monocytes Relative: 8 %
Neutro Abs: 10.3 10*3/uL — ABNORMAL HIGH (ref 1.7–7.7)
Neutrophils Relative %: 72 %
Platelets: 269 10*3/uL (ref 150–400)
RBC: 4.72 MIL/uL (ref 4.22–5.81)
RDW: 13.8 % (ref 11.5–15.5)
WBC: 14.2 10*3/uL — ABNORMAL HIGH (ref 4.0–10.5)
nRBC: 0 % (ref 0.0–0.2)

## 2019-07-29 LAB — URINALYSIS, ROUTINE W REFLEX MICROSCOPIC
Bacteria, UA: NONE SEEN
Bilirubin Urine: NEGATIVE
Glucose, UA: NEGATIVE mg/dL
Ketones, ur: 5 mg/dL — AB
Leukocytes,Ua: NEGATIVE
Nitrite: NEGATIVE
Protein, ur: NEGATIVE mg/dL
Specific Gravity, Urine: 1.005 (ref 1.005–1.030)
pH: 7 (ref 5.0–8.0)

## 2019-07-29 LAB — COMPREHENSIVE METABOLIC PANEL
ALT: 38 U/L (ref 0–44)
AST: 36 U/L (ref 15–41)
Albumin: 3.3 g/dL — ABNORMAL LOW (ref 3.5–5.0)
Alkaline Phosphatase: 120 U/L (ref 38–126)
Anion gap: 14 (ref 5–15)
BUN: 15 mg/dL (ref 8–23)
CO2: 26 mmol/L (ref 22–32)
Calcium: 10.3 mg/dL (ref 8.9–10.3)
Chloride: 98 mmol/L (ref 98–111)
Creatinine, Ser: 1.06 mg/dL (ref 0.61–1.24)
GFR calc Af Amer: >60 mL/min (ref >60)
GFR calc non Af Amer: >60 mL/min (ref >60)
Glucose, Bld: 104 mg/dL — ABNORMAL HIGH (ref 70–99)
Potassium: 3.1 mmol/L — ABNORMAL LOW (ref 3.5–5.1)
Sodium: 138 mmol/L (ref 135–145)
Total Bilirubin: 0.7 mg/dL (ref 0.3–1.2)
Total Protein: 6.8 g/dL (ref 6.5–8.1)

## 2019-07-29 LAB — VITAMIN D 25 HYDROXY (VIT D DEFICIENCY, FRACTURES): Vit D, 25-Hydroxy: 36.36 ng/mL (ref 30–100)

## 2019-07-29 LAB — SARS CORONAVIRUS 2 BY RT PCR (HOSPITAL ORDER, PERFORMED IN ~~LOC~~ HOSPITAL LAB): SARS Coronavirus 2: NEGATIVE

## 2019-07-29 LAB — MAGNESIUM: Magnesium: 1.8 mg/dL (ref 1.7–2.4)

## 2019-07-29 LAB — PSA: Prostatic Specific Antigen: 4.56 ng/mL — ABNORMAL HIGH (ref 0.00–4.00)

## 2019-07-29 MED ORDER — METOPROLOL SUCCINATE ER 50 MG PO TB24
50.0000 mg | ORAL_TABLET | Freq: Every day | ORAL | Status: DC
  Administered 2019-07-29 – 2019-08-09 (×12): 50 mg via ORAL
  Filled 2019-07-29 (×6): qty 1
  Filled 2019-07-29: qty 2
  Filled 2019-07-29 (×5): qty 1

## 2019-07-29 MED ORDER — FINASTERIDE 5 MG PO TABS
5.0000 mg | ORAL_TABLET | Freq: Every day | ORAL | Status: DC
  Administered 2019-07-29 – 2019-08-09 (×11): 5 mg via ORAL
  Filled 2019-07-29 (×12): qty 1

## 2019-07-29 MED ORDER — ACETAMINOPHEN 325 MG PO TABS
650.0000 mg | ORAL_TABLET | Freq: Four times a day (QID) | ORAL | Status: DC | PRN
  Administered 2019-07-31 – 2019-08-05 (×10): 650 mg via ORAL
  Filled 2019-07-29 (×10): qty 2

## 2019-07-29 MED ORDER — DILTIAZEM HCL ER COATED BEADS 180 MG PO CP24
180.0000 mg | ORAL_CAPSULE | Freq: Every day | ORAL | Status: DC
  Administered 2019-07-29 – 2019-08-09 (×12): 180 mg via ORAL
  Filled 2019-07-29 (×12): qty 1

## 2019-07-29 MED ORDER — SIMVASTATIN 20 MG PO TABS
10.0000 mg | ORAL_TABLET | Freq: Every day | ORAL | Status: DC
  Administered 2019-07-29 – 2019-08-09 (×11): 10 mg via ORAL
  Filled 2019-07-29 (×11): qty 1

## 2019-07-29 MED ORDER — ACETAMINOPHEN 650 MG RE SUPP: 650.0000 mg | Freq: Four times a day (QID) | RECTAL | Status: DC | PRN

## 2019-07-29 MED ORDER — TAMSULOSIN HCL 0.4 MG PO CAPS
0.8000 mg | ORAL_CAPSULE | Freq: Every day | ORAL | Status: DC
  Administered 2019-07-29 – 2019-08-09 (×11): 0.8 mg via ORAL
  Filled 2019-07-29 (×12): qty 2

## 2019-07-29 MED ORDER — ALUM & MAG HYDROXIDE-SIMETH 200-200-20 MG/5ML PO SUSP
15.0000 mL | Freq: Four times a day (QID) | ORAL | Status: DC | PRN
  Administered 2019-08-01 – 2019-08-07 (×5): 15 mL via ORAL
  Filled 2019-07-29 (×5): qty 30

## 2019-07-29 MED ORDER — HYDROMORPHONE HCL 1 MG/ML IJ SOLN
0.5000 mg | Freq: Once | INTRAMUSCULAR | Status: AC
  Administered 2019-07-29: 0.5 mg via INTRAVENOUS
  Filled 2019-07-29: qty 1

## 2019-07-29 MED ORDER — ONDANSETRON HCL 4 MG PO TABS: 4.0000 mg | ORAL_TABLET | Freq: Four times a day (QID) | ORAL | Status: DC | PRN

## 2019-07-29 MED ORDER — IOHEXOL 300 MG/ML  SOLN
100.0000 mL | Freq: Once | INTRAMUSCULAR | Status: AC | PRN
  Administered 2019-07-29: 100 mL via INTRAVENOUS

## 2019-07-29 MED ORDER — HYDROMORPHONE HCL 1 MG/ML IJ SOLN
0.5000 mg | INTRAMUSCULAR | Status: DC | PRN
  Administered 2019-08-04: 0.5 mg via INTRAVENOUS
  Filled 2019-07-29 (×2): qty 1

## 2019-07-29 MED ORDER — ALLOPURINOL 300 MG PO TABS
300.0000 mg | ORAL_TABLET | Freq: Every day | ORAL | Status: DC
  Administered 2019-07-29 – 2019-08-09 (×11): 300 mg via ORAL
  Filled 2019-07-29 (×12): qty 1

## 2019-07-29 MED ORDER — ONDANSETRON HCL 4 MG/2ML IJ SOLN: 4.0000 mg | Freq: Four times a day (QID) | INTRAMUSCULAR | Status: DC | PRN

## 2019-07-29 MED ORDER — PREGABALIN 25 MG PO CAPS
25.0000 mg | ORAL_CAPSULE | Freq: Two times a day (BID) | ORAL | Status: DC
  Administered 2019-07-29 – 2019-08-09 (×23): 25 mg via ORAL
  Filled 2019-07-29 (×23): qty 1

## 2019-07-29 MED ORDER — POLYETHYLENE GLYCOL 3350 17 G PO PACK
17.0000 g | PACK | Freq: Every day | ORAL | Status: DC | PRN
  Administered 2019-07-31: 17 g via ORAL
  Filled 2019-07-29: qty 1

## 2019-07-29 MED ORDER — DILTIAZEM HCL ER COATED BEADS 180 MG PO CP24: 180.0000 mg | ORAL_CAPSULE | Freq: Every day | ORAL | Status: DC

## 2019-07-29 MED ORDER — HYDROMORPHONE HCL 1 MG/ML IJ SOLN
1.0000 mg | INTRAMUSCULAR | Status: DC | PRN
  Administered 2019-07-29 – 2019-08-05 (×32): 1 mg via INTRAVENOUS
  Filled 2019-07-29 (×33): qty 1

## 2019-07-29 NOTE — H&P (Signed)
History and Physical    Greg Hughes NWG:[id number] DOB: 08/22/1940 DOA: 07/28/2019  PCP: Tonia Ghent, MD  Patient coming from: Home   Chief Complaint:  Chief Complaint  Patient presents with  . Hip Pain     HPI:    79 year old male with past medical history of coronary artery disease, benign prostatic hyperplasia, obstructive sleep apnea, gastroesophageal reflux disease, paroxysmal atrial fibrillation (on Xarelto), lung cancer (Dx 02/2019, Salivary gland of Lung), chronic low back pain status post spinal cord stimulator placed 02/2019 presenting to [facility] emergency department with complaints of severe low back pain, hip pain and inability to walk.  Patient explains that in mid May, while he was mowing the lawn he felt that he had the sensation of his hip "popping out of socket."  That point forward, the patient felt severe low back pain and bilateral hip pain.  Patient was evaluated at [facility] emergency department on 5/15 during which bilateral hip x-ray and lumbar spine x-rays were performed and were found to be unremarkable for anything acute.  In the weeks that followed, patient complains of severe low back pain and primarily left hip pain.  States that this pain is worse with any attempted movement and is associated with pins and needle sensations of both feet.  Patient denies any associated fecal or urinary incontinence.  Patient denies any dysuria or fever.  Patient did follow-up with his orthopedic provider at [facility] on 5/21.  No intervention or change in therapy was made and a referral was made for the patient to follow-up with a local pain management clinic.  In the weeks that followed as patient's pain continued to persist he also began to develop progressively increasing difficulty with ambulation.  Patient initially attempted to use a cane initially, followed by transition to a walker for several weeks.  Patient now reports that this week he has been using a  wheelchair at home to get around the house.  Patient symptoms continue to worsen to the point where he eventually presented again to [facility] emergency department for evaluation.  Upon evaluation in the emergency department MRI of the lumbar spine was performed that did not reveal any evidence of acute disease.  The emergency department provider also ordered a thoracic spine MRI which is still yet to be performed.  Several doses of opiate-based analgesics need to be provided during the emergency department stay include 3 doses of intravenous morphine and 1 dose of intravenous Dilaudid.  Due to continued intractable low back pain and hip pain of unclear etiology the hospitalist group was called to assess patient for admission to the hospital.    Review of Systems: A 10-system review of systems has been performed and all systems are negative with the exception of what is listed in the HPI.    Past Medical History:  Diagnosis Date  . A-fib ([place]) 2013  . Adenomatous colon polyp 04/2008  . BPH (benign prostatic hypertrophy)   . CAD (coronary artery disease)   . 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 Complication of anesthesia    " I fainted a couple of times when they went to get me up."  . Early cataract   . ED (erectile dysfunction)   . GERD (gastroesophageal reflux disease)   . Hernia, abdominal    small  . History of colon polyps   . Hyperlipidemia   . Hypertension   . Lumbar herniated  disc   . Lung nodule   . Microscopic hematuria   . Right arm weakness 04/17/2015  . Spinal stenosis   . Tobacco abuse     Past Surgical History:  Procedure Laterality Date  . ANTERIOR CERVICAL DECOMP/DISCECTOMY FUSION N/A 06/16/2014   Procedure: ANTERIOR CERVICAL DECOMPRESSION/DISCECTOMY FUSION CERVICAL FIVE-SIX,CERVICAL SIX-SEVEN;  Surgeon: Eustace Moore, MD;  Location: [place] [facility];  Service: Neurosurgery;  Laterality: N/A;  . BACK SURGERY    . COLONOSCOPY   2002  . INGUINAL HERNIA REPAIR    . SPINAL CORD STIMULATOR IMPLANT  2020   at [facility] clinic  . THOROCOTOMY WITH LOBECTOMY Left 2021  . TONSILLECTOMY       reports that he has been smoking cigarettes. He has a 60.00 pack-year smoking history. He has never used smokeless tobacco. He reports that he does not drink alcohol and does not use drugs.  No Known Allergies  Family History  Problem Relation Age of Onset  . Diabetes Father   . Coronary artery disease Father   . Stroke Father   . Arthritis Mother   . Hiatal hernia Mother   . Pulmonary embolism Mother   . Colon cancer Neg Hx   . Prostate cancer Neg Hx      Prior to Admission medications   Medication Sig Start Date End Date Taking? Authorizing Provider  allopurinol (ZYLOPRIM) 300 MG tablet TAKE 1 TABLET BY MOUTH ONCE A DAY Patient taking differently: Take 300 mg by mouth daily.  10/02/18  Yes Tonia Ghent, MD  diltiazem (CARDIZEM CD) 180 MG 24 hr capsule TAKE 1 CAPSULE BY MOUTH ONCE DAILY Patient taking differently: Take 180 mg by mouth daily.  03/01/19  Yes Gollan, Kathlene November, MD  diltiazem (CARDIZEM) 30 MG tablet TAKE 1 TABLET BY MOUTH 3 TIMES A DAY AS NEEDED FOR BREAKTHROUGH ATRIAL FIB Patient taking differently: Take 30 mg by mouth 3 (three) times daily as needed (BREAKTHROUGH ATRIAL FIB).  05/11/18  Yes Gollan, Kathlene November, MD  finasteride (PROSCAR) 5 MG tablet TAKE 1 TABLET BY MOUTH ONCE A DAY Patient taking differently: Take 5 mg by mouth daily.  04/26/19  Yes Tonia Ghent, MD  metoprolol succinate (TOPROL-XL) 50 MG 24 hr tablet TAKE 1 TABLET BY MOUTH DAILY WITH OR IMMEDIATELY FOLLOWING A MEAL Patient taking differently: Take 50 mg by mouth daily.  04/01/19  Yes Gollan, Kathlene November, MD  predniSONE (DELTASONE) 20 MG tablet Take 2 a day for 5 days, then 1 a day for 5 days, with food. Don't take with aleve/ibuprofen. Patient taking differently: Take 20 mg by mouth See admin instructions. Take 2 a day for 5 days, then 1 a day for  5 days, with food. Don't take with aleve/ibuprofen. 07/27/19  Yes Tonia Ghent, MD  pregabalin (LYRICA) 25 MG capsule Take 1 capsule (25 mg total) by mouth 2 (two) times daily. 07/27/19  Yes Tonia Ghent, MD  simvastatin (ZOCOR) 10 MG tablet TAKE 1 TABLET BY MOUTH ONCE EVERY EVENING Patient taking differently: Take 10 mg by mouth daily at 6 PM.  10/02/18  Yes Tonia Ghent, MD  tamsulosin (FLOMAX) 0.4 MG CAPS capsule Take 0.8 mg by mouth daily. 07/12/19  Yes [provider]  terazosin (HYTRIN) 10 MG capsule TAKE 1 CAPSULE BY MOUTH EVERY NIGHT AT BEDTIME Patient taking differently: Take 10 mg by mouth at bedtime.  10/02/18  Yes Tonia Ghent, MD  traMADol (ULTRAM) 50 MG tablet Take 2 tablets (100 mg  total) by mouth 3 (three) times daily as needed (for pain.  sedation caution.). 07/27/19  Yes Tonia Ghent, MD  XARELTO 20 MG TABS tablet TAKE 1 TABLET BY MOUTH ONCE DAILY WITH SUPPER Patient taking differently: Take 20 mg by mouth daily with supper.  06/29/19  Yes Gollan, Kathlene November, MD  baclofen (LIORESAL) 10 MG tablet Take 0.5-1 tablets (5-10 mg total) by mouth 2 (two) times daily as needed for muscle spasms. 07/28/19   Tonia Ghent, MD    Physical Exam: Vitals:   07/28/19 1830 07/28/19 2200 07/28/19 2230 07/29/19 0030  BP: (!) 176/88 (!) 186/97 (!) 177/87 (!) 170/88  Pulse: 67 83 63 65  Resp: 11 15 10    Temp:      TempSrc:      SpO2: 97% 97% 98% 98%  Weight:        Constitutional: Acute alert and oriented x3, patient is in distress due to pain. Skin: no rashes, no lesions, good skin turgor noted. Eyes: Pupils are equally reactive to light.  No evidence of scleral icterus or conjunctival pallor.  ENMT: Moist mucous membranes noted.  Posterior pharynx clear of any exudate or lesions.   Neck: normal, supple, no masses, no thyromegaly.  No evidence of jugular venous distension.   Respiratory: clear to auscultation bilaterally, no wheezing, no crackles. Normal respiratory  effort. No accessory muscle use.  Cardiovascular: Regular rate and rhythm, no murmurs / rubs / gallops. No extremity edema. 2+ pedal pulses. No carotid bruits.  Chest:   Nontender without crepitus or deformity.   Back:   Notable lumbar tenderness without crepitus or deformity.   Abdomen: Abdomen is soft and nontender.  No evidence of intra-abdominal masses.  Positive bowel sounds noted in all quadrants.   Musculoskeletal: Exquisite pain of the left hip and low back with manipulation of the left lower extremity, particularly when the left hip joint is isolated.  No joint deformity upper and lower extremities. tractures. Normal muscle tone.  Neurologic: CN 2-12 grossly intact.  Unable to assess strength of the left lower extremity which is extremely weak due to pain.  Patient is able to move all other extremities spontaneously.  Patient is following all commands.  Patient is responsive to verbal stimuli.   Psychiatric: Patient presents as a normal mood with appropriate affect.  Patient seems to possess insight as to theircurrent situation.     Labs on Admission: I have personally reviewed following labs and imaging studies -   CBC: Recent Labs  Lab 07/28/19 1836  WBC 16.0*  NEUTROABS 10.6*  HGB 14.1  HCT 42.0  MCV 89.9  PLT 097   Basic Metabolic Panel: Recent Labs  Lab 07/28/19 1836  NA 138  K 3.4*  CL 100  CO2 26  GLUCOSE 107*  BUN 20  CREATININE 1.09  CALCIUM 10.9*   GFR: Estimated Creatinine Clearance: 56.7 mL/min (by C-G formula based on SCr of 1.09 mg/dL). Liver Function Tests: Recent Labs  Lab 07/28/19 1836  AST 33  ALT 39  ALKPHOS 117  BILITOT 0.9  PROT 7.4  ALBUMIN 3.3*   No results for input(s): LIPASE, AMYLASE in the last 168 hours. No results for input(s): AMMONIA in the last 168 hours. Coagulation Profile: Recent Labs  Lab 07/28/19 1836  INR 1.3*   Cardiac Enzymes: No results for input(s): CKTOTAL, CKMB, CKMBINDEX, TROPONINI in the last 168  hours. BNP (last 3 results) No results for input(s): PROBNP in the last 8760 hours. HbA1C: No  results for input(s): HGBA1C in the last 72 hours. CBG: No results for input(s): GLUCAP in the last 168 hours. Lipid Profile: No results for input(s): CHOL, HDL, LDLCALC, TRIG, CHOLHDL, LDLDIRECT in the last 72 hours. Thyroid Function Tests: No results for input(s): TSH, T4TOTAL, FREET4, T3FREE, THYROIDAB in the last 72 hours. Anemia Panel: No results for input(s): VITAMINB12, FOLATE, FERRITIN, TIBC, IRON, RETICCTPCT in the last 72 hours. Urine analysis:    Component Value Date/Time   COLORURINE STRAW (A) 04/11/2018 1615   APPEARANCEUR CLEAR (A) 04/11/2018 1615   APPEARANCEUR Clear 10/27/2017 1241   LABSPEC 1.004 (L) 04/11/2018 1615   LABSPEC 1.004 11/08/2011 1457   PHURINE 7.0 04/11/2018 1615   GLUCOSEU NEGATIVE 04/11/2018 1615   GLUCOSEU NEGATIVE 10/12/2013 1022   HGBUR SMALL (A) 04/11/2018 1615   BILIRUBINUR NEGATIVE 04/11/2018 1615   BILIRUBINUR Negative 10/27/2017 1241   BILIRUBINUR Negative 11/08/2011 1457   KETONESUR NEGATIVE 04/11/2018 1615   PROTEINUR NEGATIVE 04/11/2018 1615   UROBILINOGEN 0.2 10/12/2013 1032   UROBILINOGEN 0.2 10/12/2013 1022   NITRITE NEGATIVE 04/11/2018 1615   LEUKOCYTESUR NEGATIVE 04/11/2018 1615   LEUKOCYTESUR Negative 11/08/2011 1457    Radiological Exams on Admission - Personally Reviewed: MR LUMBAR SPINE WO CONTRAST  Result Date: 07/28/2019 CLINICAL DATA:  Back pain with left lower extremity weakness EXAM: MRI LUMBAR SPINE WITHOUT CONTRAST TECHNIQUE: Multiplanar, multisequence MR imaging of the lumbar spine was performed. No intravenous contrast was administered. COMPARISON:  04/24/2017 FINDINGS: Some sequences had to be omitted due to SAR restrictions related to the patient's spinal stimulator. Segmentation: Standard. Alignment:  Physiologic. Vertebrae:  L3-5 PLIF.  No acute abnormality. Conus medullaris and cauda equina: Conus extends to the L1  level. Conus and cauda equina appear normal. Paraspinal and other soft tissues: Negative Disc levels: L1-L2: Normal disc space and facet joints. There is no spinal canal stenosis. No neural foraminal stenosis. L2-L3: Mild disc bulge. Endplate spurring. There is no spinal canal stenosis. Mild bilateral neural foraminal stenosis. L3-L4: PLIF. There is no spinal canal stenosis. Limited visualization of the neural foramina due to susceptibility effects from spinal hardware. Within that limitation, no visible neural foraminal stenosis. L4-L5: PLIF. There is no spinal canal stenosis. Limited visualization of the neural foramina due to susceptibility effects from spinal hardware. Within that limitation, no visible neural foraminal stenosis. L5-S1: Normal disc space and facet joints. There is no spinal canal stenosis. No neural foraminal stenosis. Visualized sacrum: Normal. IMPRESSION: 1. Scan time had to be limited due to SAR restrictions imposed by the patient's spinal stimulator device. This caused the examination to be truncated and of reduced image quality. 2. L3-5 PLIF without spinal canal stenosis. No visible foraminal stenosis within limitations caused by adjacent hardware. 3. Mild bilateral L2-3 neural foraminal stenosis. Electronically Signed   By: Ulyses Jarred M.D.   On: 07/28/2019 23:45    EKG: Personally reviewed.  Rhythm is normal sinus rhythm with heart rate of 69 bpm.  No dynamic ST segment changes appreciated.  Assessment/Plan Principal Problem:   Intractable low back and hip pain   Patient presenting with 4-week history of progressively worsening low back and left hip pain beyond his baseline (patient has a spinal cord stimulator placed 02/2019).  Emergency department provider is particularly concerned about low back pain but on my examination I am specifically able to elicit severe pain with isolation of the left hip joint.  I am concerned the patient may actually be suffering from left hip  fracture missed on x-ray  on 5/15.  We will proceed with imaging of the thoracic spine as ordered by ER provider  In the meantime, we will also obtain CT imaging of the left hip  Based on these results -if thoracic spine reveals spinal stenosis or concerning acute injury will involve neurosurgery.  If thoracic spine MRI is normal with evidence of left hip fracture will involve orthopedic surgery for evaluation of hip.  We will continue to provide patient with high doses of intravenous opiate-based analgesics considering substantial associated pain  PT evaluation has been ordered but this may be temporarily withheld based on the results of the radiographic work-up.  Active Problems:   Essential hypertension   Continue home regimen of antihypertensive therapy    COPD (chronic obstructive pulmonary disease) (HCC)   No evidence of COPD exacerbation at this time  As needed bronchodilator therapy for shortness of breath and wheezing.    AF (paroxysmal atrial fibrillation) (HCC)   Continue home regimen of AV nodal blocking therapy  Continue Xarelto  Monitor patient on telemetry    Benign prostatic hyperplasia   Continue Flomax and Proscar    Chronic diastolic CHF (congestive heart failure) (HCC)   No evidence of volume overload at this time    Coronary artery disease involving native coronary artery of native heart   Continue statin therapy, AV nodal blocking agents  Monitoring patient on telemetry  Patient currently chest pain-free    Mixed hyperlipidemia   Continue home regimen of statin therapy    Gout  Continue home regimen of allopurinol    Code Status:  Full code Family Communication: Deferred  Status is: Observation  The patient remains OBS appropriate and will d/c before 2 midnights.  Dispo: The patient is from: Home              Anticipated d/c is to: Home with home health              Anticipated d/c date is: 2 days              Patient  currently is not medically stable to d/c.        Vernelle Emerald MD [facility] Pager [phone]  If 7PM-7AM, please contact night-coverage [email or web address] Use universal [place] password for that web site. If you do not have the password, please call the hospital operator.  07/29/2019, 3:09 AM

## 2019-07-29 NOTE — H&P (View-Only) (Signed)
Reason for Consult:Left hip fx Referring Physician: C Nesta Hughes is an 79 y.o. male.  HPI: Greg Hughes comes to the ED with a long standing hx/o back and hip pain that have gotten progressively worse. He seemed to be in his usual state of health until about 2 months ago when he developed severe right hip pain after mowing some grass. This moved over to the left hip and stayed there. At first he was getting around with a cane but then had to graduate to a RW and, for the last 2d has been WC bound. While he has been able to transfer he does not think he could do it now. 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 He has a hx/o lung for which he underwent lobectomy this year and there's some question of prostate as well. He gets all of his treatment at [facility].  Past Medical History:  Diagnosis Date  . A-fib ([place]) 2013  . Adenomatous colon polyp 04/2008  . BPH (benign prostatic hypertrophy)   . CAD (coronary artery disease)   . 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 Complication of anesthesia    " I fainted a couple of times when they went to get me up."  . Early cataract   . ED (erectile dysfunction)   . GERD (gastroesophageal reflux disease)   . Hernia, abdominal    small  . History of colon polyps   . Hyperlipidemia   . Hypertension   . Lumbar herniated disc   . Lung nodule   . Microscopic hematuria   . Right arm weakness 04/17/2015  . Spinal stenosis   . Tobacco abuse     Past Surgical History:  Procedure Laterality Date  . ANTERIOR CERVICAL DECOMP/DISCECTOMY FUSION N/A 06/16/2014   Procedure: ANTERIOR CERVICAL DECOMPRESSION/DISCECTOMY FUSION CERVICAL FIVE-SIX,CERVICAL SIX-SEVEN;  Surgeon: Eustace Moore, MD;  Location: [facility];  Service: Neurosurgery;  Laterality: N/A;  . BACK SURGERY    . COLONOSCOPY  2002  . INGUINAL HERNIA REPAIR    . SPINAL CORD STIMULATOR IMPLANT  2020   at [facility] clinic  .  THOROCOTOMY WITH LOBECTOMY Left 2021  . TONSILLECTOMY      Family History  Problem Relation Age of Onset  . Diabetes Father   . Coronary artery disease Father   . Stroke Father   . Arthritis Mother   . Hiatal hernia Mother   . Pulmonary embolism Mother   . Colon cancer Neg Hx   . Prostate cancer Neg Hx     Social History:  reports that he has been smoking cigarettes. He has a 60.00 pack-year smoking history. He has never used smokeless tobacco. He reports that he does not drink alcohol and does not use drugs.  Allergies: No Known Allergies  Medications: I have reviewed the patient's current medications.  Results for orders placed or performed during the hospital encounter of 07/28/19 (from the past 48 hour(s))  Comprehensive metabolic panel     Status: Abnormal   Collection Time: 07/28/19  6:36 PM  Result Value Ref Range   Sodium 138 135 - 145 mmol/L   Potassium 3.4 (L) 3.5 - 5.1 mmol/L   Chloride 100 98 - 111 mmol/L   CO2 26 22 - 32 mmol/L   Glucose, Bld 107 (H) 70 - 99 mg/dL    Comment: Glucose reference range applies only to samples taken after  fasting for at least 8 hours.   BUN 20 8 - 23 mg/dL   Creatinine, Ser 1.09 0.61 - 1.24 mg/dL   Calcium 10.9 (H) 8.9 - 10.3 mg/dL   Total Protein 7.4 6.5 - 8.1 g/dL   Albumin 3.3 (L) 3.5 - 5.0 g/dL   AST 33 15 - 41 U/L   ALT 39 0 - 44 U/L   Alkaline Phosphatase 117 38 - 126 U/L   Total Bilirubin 0.9 0.3 - 1.2 mg/dL   GFR calc non Af Amer >60 >60 mL/min   GFR calc Af Amer >60 >60 mL/min   Anion gap 12 5 - 15    Comment: Performed at [facility] [address] [place], [place] [address]  CBC with Differential     Status: Abnormal   Collection Time: 07/28/19  6:36 PM  Result Value Ref Range   WBC 16.0 (H) 4.0 - 10.5 K/uL   RBC 4.67 4.22 - 5.81 MIL/uL   Hemoglobin 14.1 13.0 - 17.0 g/dL   HCT 42.0 39 - 52 %   MCV 89.9 80.0 - 100.0 fL   MCH 30.2 26.0 - 34.0 pg   MCHC 33.6 30.0 - 36.0 g/dL   RDW 13.8 11.5 - 15.5 %    Platelets 262 150 - 400 K/uL   nRBC 0.0 0.0 - 0.2 %   Neutrophils Relative % 66 %   Neutro Abs 10.6 (H) 1.7 - 7.7 K/uL   Lymphocytes Relative 25 %   Lymphs Abs 4.1 (H) 0.7 - 4.0 K/uL   Monocytes Relative 8 %   Monocytes Absolute 1.2 (H) 0 - 1 K/uL   Eosinophils Relative 1 %   Eosinophils Absolute 0.1 0 - 0 K/uL   Basophils Relative 0 %   Basophils Absolute 0.0 0 - 0 K/uL   Immature Granulocytes 0 %   Abs Immature Granulocytes 0.06 0.00 - 0.07 K/uL    Comment: Performed at [facility], [place] [address], [place], [place] [address]  Protime-INR     Status: Abnormal   Collection Time: 07/28/19  6:36 PM  Result Value Ref Range   Prothrombin Time 15.6 (H) 11.4 - 15.2 seconds   INR 1.3 (H) 0.8 - 1.2    Comment: (NOTE) INR goal varies based on device and disease states. Performed at [facility], [place] [address] [place], [place] [address]   SARS Coronavirus 2 by RT PCR (hospital order, performed in [facility] hospital lab) Nasopharyngeal Nasopharyngeal Swab     Status: None   Collection Time: 07/29/19 12:27 AM   Specimen: Nasopharyngeal Swab  Result Value Ref Range   SARS Coronavirus 2 NEGATIVE NEGATIVE    Comment: (NOTE) SARS-CoV-2 target nucleic acids are NOT DETECTED.  The SARS-CoV-2 RNA is generally detectable in upper and lower respiratory specimens during the acute phase of infection. The lowest concentration of SARS-CoV-2 viral copies this assay can detect is 250 copies / mL. A negative result does not preclude SARS-CoV-2 infection and should not be used as the sole basis for treatment or other patient management decisions.  A negative result may occur with improper specimen collection / handling, submission of specimen other than nasopharyngeal swab, presence of viral mutation(s) within the areas targeted by this assay, and inadequate number of viral copies (<250 copies / mL). A negative result must be combined with clinical observations, patient history, and  epidemiological information.  Fact Sheet for Patients:   StrictlyIdeas.no  Fact Sheet for Healthcare Providers:  BankingDealers.co.za  This test is not yet approved or  cleared by the [place] and has been authorized for detection and/or diagnosis of SARS-CoV-2 by FDA under an Emergency Use Authorization (EUA).  This EUA will remain in effect (meaning this test can be used) for the duration of the COVID-19 declaration under Section 564(b)(1) of the Act, 21 U.S.Greg. section 360bbb-3(b)(1), unless the authorization is terminated or revoked sooner.  Performed at [facility], [place] [address] [place], [place] [address]   Urinalysis, Routine w reflex microscopic     Status: Abnormal   Collection Time: 07/29/19 12:57 AM  Result Value Ref Range   Color, Urine STRAW (A) YELLOW   APPearance CLEAR CLEAR   Specific Gravity, Urine 1.005 1.005 - 1.030   pH 7.0 5.0 - 8.0   Glucose, UA NEGATIVE NEGATIVE mg/dL   Hgb urine dipstick SMALL (A) NEGATIVE   Bilirubin Urine NEGATIVE NEGATIVE   Ketones, ur 5 (A) NEGATIVE mg/dL   Protein, ur NEGATIVE NEGATIVE mg/dL   Nitrite NEGATIVE NEGATIVE   Leukocytes,Ua NEGATIVE NEGATIVE   RBC / HPF 0-5 0 - 5 RBC/hpf   WBC, UA 0-5 0 - 5 WBC/hpf   Bacteria, UA NONE SEEN NONE SEEN   Amorphous Crystal PRESENT     Comment: Performed at [facility] [address], [place], [place] [address]  PSA     Status: Abnormal   Collection Time: 07/29/19  4:50 AM  Result Value Ref Range   Prostatic Specific Antigen 4.56 (H) 0.00 - 4.00 ng/mL    Comment: (NOTE) While PSA levels of <=4.0 ng/ml are reported as reference range, some men with levels below 4.0 ng/ml can have prostate cancer and many men with PSA above 4.0 ng/ml do not have prostate cancer.  Other tests such as free PSA, age specific reference ranges, PSA velocity and PSA doubling time may be helpful especially in men less than 59  years old. Performed at [facility], [place] [address], [place], [place] [address]   CBC WITH DIFFERENTIAL     Status: Abnormal   Collection Time: 07/29/19  8:34 AM  Result Value Ref Range   WBC 14.2 (H) 4.0 - 10.5 K/uL   RBC 4.72 4.22 - 5.81 MIL/uL   Hemoglobin 14.5 13.0 - 17.0 g/dL   HCT 43.0 39 - 52 %   MCV 91.1 80.0 - 100.0 fL   MCH 30.7 26.0 - 34.0 pg   MCHC 33.7 30.0 - 36.0 g/dL   RDW 13.8 11.5 - 15.5 %   Platelets 269 150 - 400 K/uL   nRBC 0.0 0.0 - 0.2 %   Neutrophils Relative % 72 %   Neutro Abs 10.3 (H) 1.7 - 7.7 K/uL   Lymphocytes Relative 19 %   Lymphs Abs 2.7 0.7 - 4.0 K/uL   Monocytes Relative 8 %   Monocytes Absolute 1.1 (H) 0 - 1 K/uL   Eosinophils Relative 0 %   Eosinophils Absolute 0.1 0 - 0 K/uL   Basophils Relative 0 %   Basophils Absolute 0.1 0 - 0 K/uL   Immature Granulocytes 1 %   Abs Immature Granulocytes 0.07 0.00 - 0.07 K/uL    Comment: Performed at [facility], [address] [place], [place] [address]    CT CHEST W CONTRAST  Result Date: 07/29/2019 CLINICAL DATA:  Osseous metastatic disease involving the left pelvis and femur, assess for primary and staging EXAM: CT CHEST, ABDOMEN, AND PELVIS WITH  CONTRAST TECHNIQUE: Multidetector CT imaging of the chest, abdomen and pelvis was performed following the standard protocol during bolus administration of intravenous contrast. CONTRAST:  149mL OMNIPAQUE IOHEXOL 300 MG/ML  SOLN COMPARISON:  CT left hip, 07/29/2019, MR lumbar spine, 07/28/2019 FINDINGS: CT CHEST FINDINGS Cardiovascular: Aortic atherosclerosis. Normal heart size. Three-vessel coronary artery calcifications and stents. No pericardial effusion. Mediastinum/Nodes: No enlarged mediastinal, hilar, or axillary lymph nodes. There is thickening of the esophagus near the gastroesophageal junction, likely a small hiatal hernia (series 5, image 73). Thyroid gland and trachea demonstrate no significant findings. Lungs/Pleura: Mild centrilobular  emphysema. Trace bilateral pleural effusions and bandlike scarring or atelectasis of the bilateral lung bases. Evidence of prior left lower lobe wedge resection. Nonspecific 6 mm pulmonary nodule of the right lung base, new compared to remote prior CT dated 05/30/2006 (series 4, image 108). Musculoskeletal: Lytic lesion involving the spine of the right scapula measuring 2.6 x 1.9 cm (series 3, image 1). Subtle lytic lesions involving the left manubrium measuring 1.4 x 1.3 cm (series 3, image 11) and the inner table of the inferior manubrium measuring 1.9 x 1.4 cm (series 3, image 16). There is a lytic osseous lesion involving the left aspect of the T9 and T10 vertebral bodies with and extraosseous soft tissue component, measuring 3.9 x 3.6 cm at the level of T10 (series 3, image 43). CT ABDOMEN PELVIS FINDINGS Hepatobiliary: There are multiple low-attenuation lesions of the liver, the majority of which appear to be simple cysts and stable compared to remote prior examination dated 05/30/2006, however there are multiple additional, ill-defined hypodense lesions, for example of the liver dome measuring 1.7 x 1.1 cm (series 3, image 46) and of the anterior right lobe of the liver measuring 1.6 x 1.5 cm (series 3, image 63). No gallstones, gallbladder wall thickening, or biliary dilatation. Pancreas: Unremarkable. No pancreatic ductal dilatation or surrounding inflammatory changes. Spleen: Normal in size without significant abnormality. Adrenals/Urinary Tract: Right adrenal nodule measuring 2.2 x 2.1 cm, enlarged compared to prior examination dated 05/30/2006 although low-attenuation, HU = 9, and very likely a benign adenoma (series 3, image 55). There are bilateral simple cysts of the kidneys. 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 Bladder is unremarkable. Stomach/Bowel: Stomach is within normal limits. Appendix appears normal. No evidence of bowel wall thickening, distention, or inflammatory changes.  Sigmoid diverticulosis. Moderate burden of stool balls throughout the colon. Vascular/Lymphatic: Severe aortic atherosclerosis. No enlarged abdominal or pelvic lymph nodes. Reproductive: No mass or other abnormality. Other: No abdominal wall hernia or abnormality. Evidence of prior right inguinal hernia repair. No abdominopelvic ascites. Musculoskeletal: Lytic lesion of the right aspect of the ilium abutting the sacroiliac joint measuring 3.0 x 1.9 cm (series 3, image 101). Previously described lytic lesions involving the left hemi sacrum and adjacent ilium, measuring 5.6 x 4.7 cm (series 3, image 100) and with a pathologic fracture of the left femoral neck, soft tissue mass measuring 5.5 x 3.8 cm (series 3, image 12). Lytic lesion of the right femoral neck with erosion of the posterior cortex measuring 4.0 x 3.5 cm (series 3, image 125). IMPRESSION: 1. Multifocal osseous metastatic disease as detailed above, including a previously identified pathologic fracture of the left femoral neck. There are multiple additional lesions of mechanical significance at risk for pathologic fracture, particularly of the right femoral neck and T9 and T10 vertebral bodies. 2. Ill-defined low-attenuation lesions of the liver consistent with hepatic metastatic disease. 3. There is no candidate primary lesion confidently  identified in the chest, abdomen, or pelvis. 4. Thickening of the esophagus near the gastroesophageal junction, likely a small hiatal hernia, esophageal malignancy less favored and without secondary findings such as adjacent lymphadenopathy. 5. Nonspecific 6 mm pulmonary nodule of the right lung base, new compared to remote prior CT dated 05/30/2006 although generally an unlikely solitary manifestation of pulmonary metastatic disease. Attention on follow-up. 6. Right adrenal nodule, which is enlarged compared to prior examination dated 05/30/2006 although low-attenuation and very likely a benign adenoma. Attention on  follow-up. 7. There is a pathologic fracture of the left femoral neck with soft tissue mass measuring 5.5 x 3.8 cm. 8. Trace bilateral pleural effusions without obvious nodularity or other manifestations of pleural metastatic disease. 9. Evidence of prior left lower lobe wedge resection. 10. Other chronic, incidental, and postoperative findings as detailed above. Coronary artery disease. Emphysema (ICD10-J43.9). Aortic Atherosclerosis (ICD10-I70.0). Electronically Signed   By: Eddie Candle M.D.   On: 07/29/2019 08:29   MR LUMBAR SPINE WO CONTRAST  Result Date: 07/28/2019 CLINICAL DATA:  Back pain with left lower extremity weakness EXAM: MRI LUMBAR SPINE WITHOUT CONTRAST TECHNIQUE: Multiplanar, multisequence MR imaging of the lumbar spine was performed. No intravenous contrast was administered. COMPARISON:  04/24/2017 FINDINGS: Some sequences had to be omitted due to SAR restrictions related to the patient's spinal stimulator. Segmentation: Standard. Alignment:  Physiologic. Vertebrae:  L3-5 PLIF.  No acute abnormality. Conus medullaris and cauda equina: Conus extends to the L1 level. Conus and cauda equina appear normal. Paraspinal and other soft tissues: Negative Disc levels: L1-L2: Normal disc space and facet joints. There is no spinal canal stenosis. No neural foraminal stenosis. L2-L3: Mild disc bulge. Endplate spurring. There is no spinal canal stenosis. Mild bilateral neural foraminal stenosis. L3-L4: PLIF. There is no spinal canal stenosis. Limited visualization of the neural foramina due to susceptibility effects from spinal hardware. Within that limitation, no visible neural foraminal stenosis. L4-L5: PLIF. There is no spinal canal stenosis. Limited visualization of the neural foramina due to susceptibility effects from spinal hardware. Within that limitation, no visible neural foraminal stenosis. L5-S1: Normal disc space and facet joints. There is no spinal canal stenosis. No neural foraminal stenosis.  Visualized sacrum: Normal. IMPRESSION: 1. Scan time had to be limited due to SAR restrictions imposed by the patient's spinal stimulator device. This caused the examination to be truncated and of reduced image quality. 2. L3-5 PLIF without spinal canal stenosis. No visible foraminal stenosis within limitations caused by adjacent hardware. 3. Mild bilateral L2-3 neural foraminal stenosis. Electronically Signed   By: Ulyses Jarred M.D.   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 CONTRAST:  149mL OMNIPAQUE IOHEXOL 300 MG/ML  SOLN COMPARISON:  CT left hip, 07/29/2019, MR lumbar spine, 07/28/2019 FINDINGS: CT CHEST FINDINGS Cardiovascular: Aortic atherosclerosis. Normal heart size. Three-vessel coronary artery calcifications and stents. No pericardial effusion. Mediastinum/Nodes: No enlarged mediastinal, hilar, or axillary lymph nodes. There is thickening of the esophagus near the gastroesophageal junction, likely a small hiatal hernia (series 5, image 73). Thyroid gland and trachea demonstrate no significant findings. Lungs/Pleura: Mild centrilobular emphysema. Trace bilateral pleural effusions and bandlike scarring or atelectasis of the bilateral lung bases. Evidence  of prior left lower lobe wedge resection. Nonspecific 6 mm pulmonary nodule of the right lung base, new compared to remote prior CT dated 05/30/2006 (series 4, image 108). Musculoskeletal: Lytic lesion involving the spine of the right scapula measuring 2.6 x 1.9 cm (series 3, image 1). Subtle lytic lesions involving the left manubrium measuring 1.4 x 1.3 cm (series 3, image 11) and the inner  table of the inferior manubrium measuring 1.9 x 1.4 cm (series 3, image 16). There is a lytic osseous lesion involving the left aspect of the T9 and T10 vertebral bodies with and extraosseous soft tissue component, measuring 3.9 x 3.6 cm at the level of T10 (series 3, image 43). CT ABDOMEN PELVIS FINDINGS Hepatobiliary: There are multiple low-attenuation lesions of the liver, the majority of which appear to be simple cysts and stable compared to remote prior examination dated 05/30/2006, however there are multiple additional, ill-defined hypodense lesions, for example of the liver dome measuring 1.7 x 1.1 cm (series 3, image 46) and of the anterior right lobe of the liver measuring 1.6 x 1.5 cm (series 3, image 63). No gallstones, gallbladder wall thickening, or biliary dilatation. Pancreas: Unremarkable. No pancreatic ductal dilatation or surrounding inflammatory changes. Spleen: Normal in size without significant abnormality. Adrenals/Urinary Tract: Right adrenal nodule measuring 2.2 x 2.1 cm, enlarged compared to prior examination dated 05/30/2006 although low-attenuation, HU = 9, and very likely a benign adenoma (series 3, image 55). There are bilateral simple cysts of the kidneys. 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 Bladder is unremarkable. Stomach/Bowel: Stomach is within normal limits. Appendix appears normal. No evidence of bowel wall thickening, distention, or inflammatory changes. Sigmoid diverticulosis. Moderate burden of stool balls throughout the colon. Vascular/Lymphatic: Severe aortic atherosclerosis. No enlarged abdominal or pelvic lymph nodes. Reproductive: No mass or other abnormality. Other: No abdominal wall hernia or abnormality. Evidence of prior right inguinal hernia repair. No abdominopelvic ascites. Musculoskeletal: Lytic lesion of the right aspect of the ilium abutting the sacroiliac joint measuring 3.0 x 1.9 cm (series 3, image 101). Previously described lytic lesions  involving the left hemi sacrum and adjacent ilium, measuring 5.6 x 4.7 cm (series 3, image 100) and with a pathologic fracture of the left femoral neck, soft tissue mass measuring 5.5 x 3.8 cm (series 3, image 12). Lytic lesion of the right femoral neck with erosion of the posterior cortex measuring 4.0 x 3.5 cm (series 3, image 125). IMPRESSION: 1. Multifocal osseous metastatic disease as detailed above, including a previously identified pathologic fracture of the left femoral neck. There are multiple additional lesions of mechanical significance at risk for pathologic fracture, particularly of the right femoral neck and T9 and T10 vertebral bodies. 2. Ill-defined low-attenuation lesions of the liver consistent with hepatic metastatic disease. 3. There is no candidate primary lesion confidently identified in the chest, abdomen, or pelvis. 4. Thickening of the esophagus near the gastroesophageal junction, likely a small hiatal hernia, esophageal malignancy less favored and without secondary findings such as adjacent lymphadenopathy. 5. Nonspecific 6 mm pulmonary nodule of the right lung base, new compared to remote prior CT dated 05/30/2006 although generally an unlikely solitary manifestation of pulmonary metastatic disease. Attention on follow-up. 6. Right adrenal nodule, which is enlarged compared to prior examination dated 05/30/2006 although low-attenuation and very likely a benign adenoma. Attention on follow-up. 7. There is a pathologic fracture of the left femoral neck with soft tissue mass measuring 5.5 x 3.8 cm. 8. Trace bilateral pleural effusions without obvious  nodularity or other manifestations of pleural metastatic disease. 9. Evidence of prior left lower lobe wedge resection. 10. Other chronic, incidental, and postoperative findings as detailed above. Coronary artery disease. Emphysema (ICD10-J43.9). Aortic Atherosclerosis (ICD10-I70.0). Electronically Signed   By: Eddie Candle M.D.   On: 07/29/2019  08:29   CT HIP LEFT WO CONTRAST  Result Date: 07/29/2019 CLINICAL DATA:  Severe hip pain. EXAM: CT OF THE LEFT HIP WITHOUT CONTRAST TECHNIQUE: Multidetector CT imaging of the left hip was performed according to the standard protocol. Multiplanar CT image reconstructions were also generated. COMPARISON:  MRI dated June 2021 FINDINGS: Bones/Joint/Cartilage There is a lytic mass eroding the left sacrum and left iliac bone measuring approximately 6.5 cm. There is a lytic lesion involving the left femoral neck with an associated pathologic fracture. Ligaments Suboptimally assessed by CT. Muscles and Tendons The muscles are unremarkable where visualized. Soft tissues There is a small fat containing left inguinal hernia. There is scattered colonic diverticula without CT evidence for diverticulitis. The bladder is distended. IMPRESSION: 1. Lytic lesion involving the left femoral neck with associated pathologic fracture. 2. There is an additional lytic lesion in the left iliac bone and left sacrum. 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 3. Diverticulosis without CT evidence for diverticulitis. 4. Distended urinary bladder. 5. Fat containing left inguinal hernia. These results will be called to the ordering clinician or representative by the Radiologist Assistant, and communication documented in the PACS or [employer]. Electronically Signed   By: Constance Holster M.D.   On: 07/29/2019 03:27    Review of Systems  Constitutional: Negative for chills, diaphoresis, fatigue and fever.  HENT: Negative for ear discharge, ear pain, hearing loss and tinnitus.   Eyes: Negative for photophobia and pain.  Respiratory: Negative for cough and shortness of breath.   Cardiovascular: Negative for chest pain.  Gastrointestinal: Negative for abdominal pain, nausea and vomiting.  Genitourinary: Negative for dysuria, flank pain, frequency and urgency.  Musculoskeletal: Positive for arthralgias  (Bilateral hips) and back pain. Negative for myalgias and neck pain.  Neurological: Negative for dizziness and headaches.  Hematological: Does not bruise/bleed easily.  Psychiatric/Behavioral: The patient is not nervous/anxious.    Blood pressure 127/76, pulse (!) 105, temperature 97.7 F (36.5 Greg), temperature source Oral, resp. rate 11, weight 79.4 kg, SpO2 97 %. Physical Exam  Constitutional: He appears well-developed. No distress.  HENT:  Head: Normocephalic and atraumatic.  Eyes: Conjunctivae are normal. Right eye exhibits no discharge. Left eye exhibits no discharge. No scleral icterus.  Cardiovascular: Normal rate and regular rhythm.  Respiratory: Effort normal. No respiratory distress.  Musculoskeletal:     Cervical back: Normal range of motion.     Comments: LLE No traumatic wounds, ecchymosis, or rash  Mild TTP hip  No knee or ankle effusion  Knee stable to varus/ valgus and anterior/posterior stress  Sens DPN, SPN, TN intact  Motor EHL, ext, flex, evers 5/5  DP 2+, PT 1+, No significant edema  Neurological: He is alert.  Skin: Skin is warm and dry. He is not diaphoretic.  Psychiatric: His behavior is normal.    Assessment/Plan: Left pathologic hip fx -- Pt needs some sort of hip replacement but needs in conjunction with oncologic workup. We usually refer these patients to tertiary care as we have no orthopedic oncologists. It would make sense for him to be transferred to [facility] as he gets his care there and the patient is in agreement, though there is likely to  be a wait. He needs x-rays of his hip and femur and MRI of both hips but would defer these to [facility]. Multiple medical problems including coronary artery disease, benign prostatic hyperplasia, obstructive sleep apnea, gastroesophageal reflux disease, paroxysmal atrial fibrillation (on Xarelto), lung cancer (Dx 02/2019, Salivary gland of Lung), chronic low back pain status post spinal cord stimulator -- per primary service.  Would hold Xarelto but continue inpatient DVT prophylaxis.    Lisette Abu, PA-[facility] [phone] 07/29/2019, 9:34 AM

## 2019-07-29 NOTE — ED Notes (Signed)
Attempted to call 5 norht several times and no one answered no answer to charge phone as well

## 2019-07-29 NOTE — Plan of Care (Signed)

## 2019-07-29 NOTE — ED Notes (Signed)
Lunch Tray Ordered @ 1022.

## 2019-07-29 NOTE — ED Notes (Signed)
Pt is oriented to himself, place but not year. He stated that they year was 2022.  Pt has been dilaudid and and when if it is effective he stated that he didn't know. When asked about his pain he stated that he was doing much better

## 2019-07-29 NOTE — Assessment & Plan Note (Addendum)
My concern is that he has pain originating in his lower back causing pain near the hips and also his new mild weakness for foot dorsiflexion bilaterally.  Discussed options.  He is likely going to need follow-up MRI.  I have called the [place] [facility] in the meantime to see when and how he can have follow-up MRI done.  I gave him a prescription for tramadol to use in the meantime with routine sedation caution.  Stop using hydrocodone in the meantime since that was not effective.  He can try tapering down on Lyrica in the meantime.  He will update me as needed.  He can try prednisone taper with routine cautions in the meantime.  I appreciate the help of all involved.  At least 30 minutes were devoted to patient care in this encounter (this can potentially include time spent reviewing the patient's file/history, interviewing and examining the patient, counseling/reviewing plan with patient, ordering referrals, ordering tests, reviewing relevant laboratory or x-ray data, and documenting the encounter).

## 2019-07-29 NOTE — Consult Note (Signed)
Reason for Consult:Left hip fx Referring Physician: C Tahjir [name] is an 79 y.o. male.  HPI: Greg Hughes comes to the ED with a long standing hx/o back and hip pain that have gotten progressively worse. He seemed to be in his usual state of health until about 2 months ago when he developed severe right hip pain after mowing some grass. This moved over to the left hip and stayed there. At first he was getting around with a cane but then had to graduate to a RW and, for the last 2d has been WC bound. While he has been able to transfer he does not think he could do it now. 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 He has a hx/o lung for which he underwent lobectomy this year and there's some question of prostate as well. He gets all of his treatment at [facility]  Past Medical History:  Diagnosis Date  . A-fib ([place]) 2013  . Adenomatous colon polyp 04/2008  . BPH (benign prostatic hypertrophy)   . CAD (coronary artery disease)   . 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 Complication of anesthesia    " I fainted a couple of times when they went to get me up."  . Early cataract   . ED (erectile dysfunction)   . GERD (gastroesophageal reflux disease)   . Hernia, abdominal    small  . History of colon polyps   . Hyperlipidemia   . Hypertension   . Lumbar herniated disc   . Lung nodule   . Microscopic hematuria   . Right arm weakness 04/17/2015  . Spinal stenosis   . Tobacco abuse     Past Surgical History:  Procedure Laterality Date  . ANTERIOR CERVICAL DECOMP/DISCECTOMY FUSION N/A 06/16/2014   Procedure: ANTERIOR CERVICAL DECOMPRESSION/DISCECTOMY FUSION CERVICAL FIVE-SIX,CERVICAL SIX-SEVEN;  Surgeon: Greg Moore, MD;  Location: [facility];  Service: Neurosurgery;  Laterality: N/A;  . BACK SURGERY    . COLONOSCOPY  2002  . INGUINAL HERNIA REPAIR    . SPINAL CORD STIMULATOR IMPLANT  2020   at [facility] clinic  .  THOROCOTOMY WITH LOBECTOMY Left 2021  . TONSILLECTOMY      Family History  Problem Relation Age of Onset  . Diabetes Father   . Coronary artery disease Father   . Stroke Father   . Arthritis Mother   . Hiatal hernia Mother   . Pulmonary embolism Mother   . Colon cancer Neg Hx   . Prostate cancer Neg Hx     Social History:  reports that he has been smoking cigarettes. He has a 60.00 pack-year smoking history. He has never used smokeless tobacco. He reports that he does not drink alcohol and does not use drugs.  Allergies: No Known Allergies  Medications: I have reviewed the patient's current medications.  Results for orders placed or performed during the hospital encounter of 07/28/19 (from the past 48 hour(s))  Comprehensive metabolic panel     Status: Abnormal   Collection Time: 07/28/19  6:36 PM  Result Value Ref Range   Sodium 138 135 - 145 mmol/L   Potassium 3.4 (L) 3.5 - 5.1 mmol/L   Chloride 100 98 - 111 mmol/L   CO2 26 22 - 32 mmol/L   Glucose, Bld 107 (H) 70 - 99 mg/dL    Comment: Glucose reference range applies only to samples taken after  fasting for at least 8 hours.   BUN 20 8 - 23 mg/dL   Creatinine, Ser 1.09 0.61 - 1.24 mg/dL   Calcium 10.9 (H) 8.9 - 10.3 mg/dL   Total Protein 7.4 6.5 - 8.1 g/dL   Albumin 3.3 (L) 3.5 - 5.0 g/dL   AST 33 15 - 41 U/L   ALT 39 0 - 44 U/L   Alkaline Phosphatase 117 38 - 126 U/L   Total Bilirubin 0.9 0.3 - 1.2 mg/dL   GFR calc non Af Amer >60 >60 mL/min   GFR calc Af Amer >60 >60 mL/min   Anion gap 12 5 - 15    Comment: Performed at [facility] [address] [place], [place] [address]  CBC with Differential     Status: Abnormal   Collection Time: 07/28/19  6:36 PM  Result Value Ref Range   WBC 16.0 (H) 4.0 - 10.5 K/uL   RBC 4.67 4.22 - 5.81 MIL/uL   Hemoglobin 14.1 13.0 - 17.0 g/dL   HCT 42.0 39 - 52 %   MCV 89.9 80.0 - 100.0 fL   MCH 30.2 26.0 - 34.0 pg   MCHC 33.6 30.0 - 36.0 g/dL   RDW 13.8 11.5 - 15.5 %    Platelets 262 150 - 400 K/uL   nRBC 0.0 0.0 - 0.2 %   Neutrophils Relative % 66 %   Neutro Abs 10.6 (H) 1.7 - 7.7 K/uL   Lymphocytes Relative 25 %   Lymphs Abs 4.1 (H) 0.7 - 4.0 K/uL   Monocytes Relative 8 %   Monocytes Absolute 1.2 (H) 0 - 1 K/uL   Eosinophils Relative 1 %   Eosinophils Absolute 0.1 0 - 0 K/uL   Basophils Relative 0 %   Basophils Absolute 0.0 0 - 0 K/uL   Immature Granulocytes 0 %   Abs Immature Granulocytes 0.06 0.00 - 0.07 K/uL    Comment: Performed at [facility], [place] [address], [place], [place] [address]  Protime-INR     Status: Abnormal   Collection Time: 07/28/19  6:36 PM  Result Value Ref Range   Prothrombin Time 15.6 (H) 11.4 - 15.2 seconds   INR 1.3 (H) 0.8 - 1.2    Comment: (NOTE) INR goal varies based on device and disease states. Performed at [facility], [place] [address] [place], [place] [address]   SARS Coronavirus 2 by RT PCR (hospital order, performed in [facility] hospital lab) Nasopharyngeal Nasopharyngeal Swab     Status: None   Collection Time: 07/29/19 12:27 AM   Specimen: Nasopharyngeal Swab  Result Value Ref Range   SARS Coronavirus 2 NEGATIVE NEGATIVE    Comment: (NOTE) SARS-CoV-2 target nucleic acids are NOT DETECTED.  The SARS-CoV-2 RNA is generally detectable in upper and lower respiratory specimens during the acute phase of infection. The lowest concentration of SARS-CoV-2 viral copies this assay can detect is 250 copies / mL. A negative result does not preclude SARS-CoV-2 infection and should not be used as the sole basis for treatment or other patient management decisions.  A negative result may occur with improper specimen collection / handling, submission of specimen other than nasopharyngeal swab, presence of viral mutation(s) within the areas targeted by this assay, and inadequate number of viral copies (<250 copies / mL). A negative result must be combined with clinical observations, patient history, and  epidemiological information.  Fact Sheet for Patients:   StrictlyIdeas.no  Fact Sheet for Healthcare Providers:  BankingDealers.co.za  This test is not yet approved or  cleared by the [place] and has been authorized for detection and/or diagnosis of SARS-CoV-2 by FDA under an Emergency Use Authorization (EUA).  This EUA will remain in effect (meaning this test can be used) for the duration of the COVID-19 declaration under Section 564(b)(1) of the Act, 21 U.S.C. section 360bbb-3(b)(1), unless the authorization is terminated or revoked sooner.  Performed at [facility], [place] [address] [place], [place] [address]   Urinalysis, Routine w reflex microscopic     Status: Abnormal   Collection Time: 07/29/19 12:57 AM  Result Value Ref Range   Color, Urine STRAW (A) YELLOW   APPearance CLEAR CLEAR   Specific Gravity, Urine 1.005 1.005 - 1.030   pH 7.0 5.0 - 8.0   Glucose, UA NEGATIVE NEGATIVE mg/dL   Hgb urine dipstick SMALL (A) NEGATIVE   Bilirubin Urine NEGATIVE NEGATIVE   Ketones, ur 5 (A) NEGATIVE mg/dL   Protein, ur NEGATIVE NEGATIVE mg/dL   Nitrite NEGATIVE NEGATIVE   Leukocytes,Ua NEGATIVE NEGATIVE   RBC / HPF 0-5 0 - 5 RBC/hpf   WBC, UA 0-5 0 - 5 WBC/hpf   Bacteria, UA NONE SEEN NONE SEEN   Amorphous Crystal PRESENT     Comment: Performed at [facility] [address] [place], [place] [address]  PSA     Status: Abnormal   Collection Time: 07/29/19  4:50 AM  Result Value Ref Range   Prostatic Specific Antigen 4.56 (H) 0.00 - 4.00 ng/mL    Comment: (NOTE) While PSA levels of <=4.0 ng/ml are reported as reference range, some men with levels below 4.0 ng/ml can have prostate cancer and many men with PSA above 4.0 ng/ml do not have prostate cancer.  Other tests such as free PSA, age specific reference ranges, PSA velocity and PSA doubling time may be helpful especially in men less than 10  years old. Performed at [facility], [place] [address], [place], [place] [address]   CBC WITH DIFFERENTIAL     Status: Abnormal   Collection Time: 07/29/19  8:34 AM  Result Value Ref Range   WBC 14.2 (H) 4.0 - 10.5 K/uL   RBC 4.72 4.22 - 5.81 MIL/uL   Hemoglobin 14.5 13.0 - 17.0 g/dL   HCT 43.0 39 - 52 %   MCV 91.1 80.0 - 100.0 fL   MCH 30.7 26.0 - 34.0 pg   MCHC 33.7 30.0 - 36.0 g/dL   RDW 13.8 11.5 - 15.5 %   Platelets 269 150 - 400 K/uL   nRBC 0.0 0.0 - 0.2 %   Neutrophils Relative % 72 %   Neutro Abs 10.3 (H) 1.7 - 7.7 K/uL   Lymphocytes Relative 19 %   Lymphs Abs 2.7 0.7 - 4.0 K/uL   Monocytes Relative 8 %   Monocytes Absolute 1.1 (H) 0 - 1 K/uL   Eosinophils Relative 0 %   Eosinophils Absolute 0.1 0 - 0 K/uL   Basophils Relative 0 %   Basophils Absolute 0.1 0 - 0 K/uL   Immature Granulocytes 1 %   Abs Immature Granulocytes 0.07 0.00 - 0.07 K/uL    Comment: Performed at [facility], [address], [place], [place] [address]    CT CHEST W CONTRAST  Result Date: 07/29/2019 CLINICAL DATA:  Osseous metastatic disease involving the left pelvis and femur, assess for primary and staging EXAM: CT CHEST, ABDOMEN, AND PELVIS WITH  CONTRAST TECHNIQUE: Multidetector CT imaging of the chest, abdomen and pelvis was performed following the standard protocol during bolus administration of intravenous contrast. CONTRAST:  153mL OMNIPAQUE IOHEXOL 300 MG/ML  SOLN COMPARISON:  CT left hip, 07/29/2019, MR lumbar spine, 07/28/2019 FINDINGS: CT CHEST FINDINGS Cardiovascular: Aortic atherosclerosis. Normal heart size. Three-vessel coronary artery calcifications and stents. No pericardial effusion. Mediastinum/Nodes: No enlarged mediastinal, hilar, or axillary lymph nodes. There is thickening of the esophagus near the gastroesophageal junction, likely a small hiatal hernia (series 5, image 73). Thyroid gland and trachea demonstrate no significant findings. Lungs/Pleura: Mild centrilobular  emphysema. Trace bilateral pleural effusions and bandlike scarring or atelectasis of the bilateral lung bases. Evidence of prior left lower lobe wedge resection. Nonspecific 6 mm pulmonary nodule of the right lung base, new compared to remote prior CT dated 05/30/2006 (series 4, image 108). Musculoskeletal: Lytic lesion involving the spine of the right scapula measuring 2.6 x 1.9 cm (series 3, image 1). Subtle lytic lesions involving the left manubrium measuring 1.4 x 1.3 cm (series 3, image 11) and the inner table of the inferior manubrium measuring 1.9 x 1.4 cm (series 3, image 16). There is a lytic osseous lesion involving the left aspect of the T9 and T10 vertebral bodies with and extraosseous soft tissue component, measuring 3.9 x 3.6 cm at the level of T10 (series 3, image 43). CT ABDOMEN PELVIS FINDINGS Hepatobiliary: There are multiple low-attenuation lesions of the liver, the majority of which appear to be simple cysts and stable compared to remote prior examination dated 05/30/2006, however there are multiple additional, ill-defined hypodense lesions, for example of the liver dome measuring 1.7 x 1.1 cm (series 3, image 46) and of the anterior right lobe of the liver measuring 1.6 x 1.5 cm (series 3, image 63). No gallstones, gallbladder wall thickening, or biliary dilatation. Pancreas: Unremarkable. No pancreatic ductal dilatation or surrounding inflammatory changes. Spleen: Normal in size without significant abnormality. Adrenals/Urinary Tract: Right adrenal nodule measuring 2.2 x 2.1 cm, enlarged compared to prior examination dated 05/30/2006 although low-attenuation, HU = 9, and very likely a benign adenoma (series 3, image 55). There are bilateral simple cysts of the kidneys. 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 Bladder is unremarkable. Stomach/Bowel: Stomach is within normal limits. Appendix appears normal. No evidence of bowel wall thickening, distention, or inflammatory changes.  Sigmoid diverticulosis. Moderate burden of stool balls throughout the colon. Vascular/Lymphatic: Severe aortic atherosclerosis. No enlarged abdominal or pelvic lymph nodes. Reproductive: No mass or other abnormality. Other: No abdominal wall hernia or abnormality. Evidence of prior right inguinal hernia repair. No abdominopelvic ascites. Musculoskeletal: Lytic lesion of the right aspect of the ilium abutting the sacroiliac joint measuring 3.0 x 1.9 cm (series 3, image 101). Previously described lytic lesions involving the left hemi sacrum and adjacent ilium, measuring 5.6 x 4.7 cm (series 3, image 100) and with a pathologic fracture of the left femoral neck, soft tissue mass measuring 5.5 x 3.8 cm (series 3, image 12). Lytic lesion of the right femoral neck with erosion of the posterior cortex measuring 4.0 x 3.5 cm (series 3, image 125). IMPRESSION: 1. Multifocal osseous metastatic disease as detailed above, including a previously identified pathologic fracture of the left femoral neck. There are multiple additional lesions of mechanical significance at risk for pathologic fracture, particularly of the right femoral neck and T9 and T10 vertebral bodies. 2. Ill-defined low-attenuation lesions of the liver consistent with hepatic metastatic disease. 3. There is no candidate primary lesion confidently  identified in the chest, abdomen, or pelvis. 4. Thickening of the esophagus near the gastroesophageal junction, likely a small hiatal hernia, esophageal malignancy less favored and without secondary findings such as adjacent lymphadenopathy. 5. Nonspecific 6 mm pulmonary nodule of the right lung base, new compared to remote prior CT dated 05/30/2006 although generally an unlikely solitary manifestation of pulmonary metastatic disease. Attention on follow-up. 6. Right adrenal nodule, which is enlarged compared to prior examination dated 05/30/2006 although low-attenuation and very likely a benign adenoma. Attention on  follow-up. 7. There is a pathologic fracture of the left femoral neck with soft tissue mass measuring 5.5 x 3.8 cm. 8. Trace bilateral pleural effusions without obvious nodularity or other manifestations of pleural metastatic disease. 9. Evidence of prior left lower lobe wedge resection. 10. Other chronic, incidental, and postoperative findings as detailed above. Coronary artery disease. Emphysema (ICD10-J43.9). Aortic Atherosclerosis (ICD10-I70.0). Electronically Signed   By: Eddie Candle M.D.   On: 07/29/2019 08:29   MR LUMBAR SPINE WO CONTRAST  Result Date: 07/28/2019 CLINICAL DATA:  Back pain with left lower extremity weakness EXAM: MRI LUMBAR SPINE WITHOUT CONTRAST TECHNIQUE: Multiplanar, multisequence MR imaging of the lumbar spine was performed. No intravenous contrast was administered. COMPARISON:  04/24/2017 FINDINGS: Some sequences had to be omitted due to SAR restrictions related to the patient's spinal stimulator. Segmentation: Standard. Alignment:  Physiologic. Vertebrae:  L3-5 PLIF.  No acute abnormality. Conus medullaris and cauda equina: Conus extends to the L1 level. Conus and cauda equina appear normal. Paraspinal and other soft tissues: Negative Disc levels: L1-L2: Normal disc space and facet joints. There is no spinal canal stenosis. No neural foraminal stenosis. L2-L3: Mild disc bulge. Endplate spurring. There is no spinal canal stenosis. Mild bilateral neural foraminal stenosis. L3-L4: PLIF. There is no spinal canal stenosis. Limited visualization of the neural foramina due to susceptibility effects from spinal hardware. Within that limitation, no visible neural foraminal stenosis. L4-L5: PLIF. There is no spinal canal stenosis. Limited visualization of the neural foramina due to susceptibility effects from spinal hardware. Within that limitation, no visible neural foraminal stenosis. L5-S1: Normal disc space and facet joints. There is no spinal canal stenosis. No neural foraminal stenosis.  Visualized sacrum: Normal. IMPRESSION: 1. Scan time had to be limited due to SAR restrictions imposed by the patient's spinal stimulator device. This caused the examination to be truncated and of reduced image quality. 2. L3-5 PLIF without spinal canal stenosis. No visible foraminal stenosis within limitations caused by adjacent hardware. 3. Mild bilateral L2-3 neural foraminal stenosis. Electronically Signed   By: Ulyses Jarred M.D.   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 CONTRAST:  181mL OMNIPAQUE IOHEXOL 300 MG/ML  SOLN COMPARISON:  CT left hip, 07/29/2019, MR lumbar spine, 07/28/2019 FINDINGS: CT CHEST FINDINGS Cardiovascular: Aortic atherosclerosis. Normal heart size. Three-vessel coronary artery calcifications and stents. No pericardial effusion. Mediastinum/Nodes: No enlarged mediastinal, hilar, or axillary lymph nodes. There is thickening of the esophagus near the gastroesophageal junction, likely a small hiatal hernia (series 5, image 73). Thyroid gland and trachea demonstrate no significant findings. Lungs/Pleura: Mild centrilobular emphysema. Trace bilateral pleural effusions and bandlike scarring or atelectasis of the bilateral lung bases. Evidence  of prior left lower lobe wedge resection. Nonspecific 6 mm pulmonary nodule of the right lung base, new compared to remote prior CT dated 05/30/2006 (series 4, image 108). Musculoskeletal: Lytic lesion involving the spine of the right scapula measuring 2.6 x 1.9 cm (series 3, image 1). Subtle lytic lesions involving the left manubrium measuring 1.4 x 1.3 cm (series 3, image 11) and the inner  table of the inferior manubrium measuring 1.9 x 1.4 cm (series 3, image 16). There is a lytic osseous lesion involving the left aspect of the T9 and T10 vertebral bodies with and extraosseous soft tissue component, measuring 3.9 x 3.6 cm at the level of T10 (series 3, image 43). CT ABDOMEN PELVIS FINDINGS Hepatobiliary: There are multiple low-attenuation lesions of the liver, the majority of which appear to be simple cysts and stable compared to remote prior examination dated 05/30/2006, however there are multiple additional, ill-defined hypodense lesions, for example of the liver dome measuring 1.7 x 1.1 cm (series 3, image 46) and of the anterior right lobe of the liver measuring 1.6 x 1.5 cm (series 3, image 63). No gallstones, gallbladder wall thickening, or biliary dilatation. Pancreas: Unremarkable. No pancreatic ductal dilatation or surrounding inflammatory changes. Spleen: Normal in size without significant abnormality. Adrenals/Urinary Tract: Right adrenal nodule measuring 2.2 x 2.1 cm, enlarged compared to prior examination dated 05/30/2006 although low-attenuation, HU = 9, and very likely a benign adenoma (series 3, image 55). There are bilateral simple cysts of the kidneys. 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 Bladder is unremarkable. Stomach/Bowel: Stomach is within normal limits. Appendix appears normal. No evidence of bowel wall thickening, distention, or inflammatory changes. Sigmoid diverticulosis. Moderate burden of stool balls throughout the colon. Vascular/Lymphatic: Severe aortic atherosclerosis. No enlarged abdominal or pelvic lymph nodes. Reproductive: No mass or other abnormality. Other: No abdominal wall hernia or abnormality. Evidence of prior right inguinal hernia repair. No abdominopelvic ascites. Musculoskeletal: Lytic lesion of the right aspect of the ilium abutting the sacroiliac joint measuring 3.0 x 1.9 cm (series 3, image 101). Previously described lytic lesions  involving the left hemi sacrum and adjacent ilium, measuring 5.6 x 4.7 cm (series 3, image 100) and with a pathologic fracture of the left femoral neck, soft tissue mass measuring 5.5 x 3.8 cm (series 3, image 12). Lytic lesion of the right femoral neck with erosion of the posterior cortex measuring 4.0 x 3.5 cm (series 3, image 125). IMPRESSION: 1. Multifocal osseous metastatic disease as detailed above, including a previously identified pathologic fracture of the left femoral neck. There are multiple additional lesions of mechanical significance at risk for pathologic fracture, particularly of the right femoral neck and T9 and T10 vertebral bodies. 2. Ill-defined low-attenuation lesions of the liver consistent with hepatic metastatic disease. 3. There is no candidate primary lesion confidently identified in the chest, abdomen, or pelvis. 4. Thickening of the esophagus near the gastroesophageal junction, likely a small hiatal hernia, esophageal malignancy less favored and without secondary findings such as adjacent lymphadenopathy. 5. Nonspecific 6 mm pulmonary nodule of the right lung base, new compared to remote prior CT dated 05/30/2006 although generally an unlikely solitary manifestation of pulmonary metastatic disease. Attention on follow-up. 6. Right adrenal nodule, which is enlarged compared to prior examination dated 05/30/2006 although low-attenuation and very likely a benign adenoma. Attention on follow-up. 7. There is a pathologic fracture of the left femoral neck with soft tissue mass measuring 5.5 x 3.8 cm. 8. Trace bilateral pleural effusions without obvious  nodularity or other manifestations of pleural metastatic disease. 9. Evidence of prior left lower lobe wedge resection. 10. Other chronic, incidental, and postoperative findings as detailed above. Coronary artery disease. Emphysema (ICD10-J43.9). Aortic Atherosclerosis (ICD10-I70.0). Electronically Signed   By: Eddie Candle M.D.   On: 07/29/2019  08:29   CT HIP LEFT WO CONTRAST  Result Date: 07/29/2019 CLINICAL DATA:  Severe hip pain. EXAM: CT OF THE LEFT HIP WITHOUT CONTRAST TECHNIQUE: Multidetector CT imaging of the left hip was performed according to the standard protocol. Multiplanar CT image reconstructions were also generated. COMPARISON:  MRI dated June 2021 FINDINGS: Bones/Joint/Cartilage There is a lytic mass eroding the left sacrum and left iliac bone measuring approximately 6.5 cm. There is a lytic lesion involving the left femoral neck with an associated pathologic fracture. Ligaments Suboptimally assessed by CT. Muscles and Tendons The muscles are unremarkable where visualized. Soft tissues There is a small fat containing left inguinal hernia. There is scattered colonic diverticula without CT evidence for diverticulitis. The bladder is distended. IMPRESSION: 1. Lytic lesion involving the left femoral neck with associated pathologic fracture. 2. There is an additional lytic lesion in the left iliac bone and left sacrum. 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 3. Diverticulosis without CT evidence for diverticulitis. 4. Distended urinary bladder. 5. Fat containing left inguinal hernia. These results will be called to the ordering clinician or representative by the Radiologist Assistant, and communication documented in the PACS or [employer]. Electronically Signed   By: Constance Holster M.D.   On: 07/29/2019 03:27    Review of Systems  Constitutional: Negative for chills, diaphoresis, fatigue and fever.  HENT: Negative for ear discharge, ear pain, hearing loss and tinnitus.   Eyes: Negative for photophobia and pain.  Respiratory: Negative for cough and shortness of breath.   Cardiovascular: Negative for chest pain.  Gastrointestinal: Negative for abdominal pain, nausea and vomiting.  Genitourinary: Negative for dysuria, flank pain, frequency and urgency.  Musculoskeletal: Positive for arthralgias  (Bilateral hips) and back pain. Negative for myalgias and neck pain.  Neurological: Negative for dizziness and headaches.  Hematological: Does not bruise/bleed easily.  Psychiatric/Behavioral: The patient is not nervous/anxious.    Blood pressure 127/76, pulse (!) 105, temperature 97.7 F (36.5 C), temperature source Oral, resp. rate 11, weight 79.4 kg, SpO2 97 %. Physical Exam  Constitutional: He appears well-developed. No distress.  HENT:  Head: Normocephalic and atraumatic.  Eyes: Conjunctivae are normal. Right eye exhibits no discharge. Left eye exhibits no discharge. No scleral icterus.  Cardiovascular: Normal rate and regular rhythm.  Respiratory: Effort normal. No respiratory distress.  Musculoskeletal:     Cervical back: Normal range of motion.     Comments: LLE No traumatic wounds, ecchymosis, or rash  Mild TTP hip  No knee or ankle effusion  Knee stable to varus/ valgus and anterior/posterior stress  Sens DPN, SPN, TN intact  Motor EHL, ext, flex, evers 5/5  DP 2+, PT 1+, No significant edema  Neurological: He is alert.  Skin: Skin is warm and dry. He is not diaphoretic.  Psychiatric: His behavior is normal.    Assessment/Plan: Left pathologic hip fx -- Pt needs some sort of hip replacement but needs in conjunction with oncologic workup. We usually refer these patients to tertiary care as we have no orthopedic oncologists. It would make sense for him to be transferred to [facility] as he gets his care there and the patient is in agreement, though there is likely to  be a wait. He needs x-rays of his hip and femur and MRI of both hips but would defer these to [facility]. Multiple medical problems including coronary artery disease, benign prostatic hyperplasia, obstructive sleep apnea, gastroesophageal reflux disease, paroxysmal atrial fibrillation (on Xarelto), lung cancer (Dx 02/2019, Salivary gland of Lung), chronic low back pain status post spinal cord stimulator -- per primary service.  Would hold Xarelto but continue inpatient DVT prophylaxis.    Lisette Abu, PA-C Orthopedic Surgery [phone] 07/29/2019, 9:34 AM

## 2019-07-29 NOTE — Progress Notes (Signed)
PROGRESS NOTE  Greg Hughes FKC:[id number] DOB: 13-Aug-1940 DOA: 07/28/2019 PCP: Tonia Ghent, MD   LOS: 0 days   Brief Narrative / Interim history: 79 year old male with past medical history of coronary artery disease, benign prostatic hyperplasia, obstructive sleep apnea, gastroesophageal reflux disease, paroxysmal atrial fibrillation (on Xarelto), lung cancer (Dx 02/2019, Salivary gland of Lung), chronic low back pain status post spinal cord stimulator placed 02/2019 presenting to [facility] emergency department with complaints of severe low back pain, hip pain and inability to walk.  Patient explains that in mid May, while he was mowing the lawn he felt that he had the sensation of his hip "popping out of socket." That point forward, the patient felt severe low back pain and bilateral hip pain. Patient was evaluated at [facility] emergency department on 5/15 during which bilateral hip x-ray and lumbar spine x-rays were performed and were found to be unremarkable for anything acute.  In the weeks that followed, patient complains of severe low back pain and primarily left hip pain. States that this pain is worse with any attempted movement and is associated with pins and needle sensations of both feet. Patient denies any associated fecal or urinary incontinence. Patient denies any dysuria or fever.  Patient did follow-up with his orthopedic provider at [facility] on 5/21. No intervention or change in therapy was made and a referral was made for the patient to follow-up with a local pain management clinic.  In the weeks that followed as patient's pain continued to persist he also began to develop progressively increasing difficulty with ambulation. Patient initially attempted to use a cane initially, followed by transition to a walker for several weeks. Patient now reports that this week he has been using a wheelchair at home to get around the house.  Patient symptoms continue to  worsen to the point where he eventually presented again to [facility] emergency department for evaluation on 6/9. Upon evaluation in the emergency department MRI of the lumbar and thoracic spine was performed that did not reveal any evidence of acute disease. 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 Several doses of opiate-based analgesics need to be provided during the emergency department stay including 3 doses of intravenous morphine and 1 dose of intravenous Dilaudid.   Due to continued intractable low back pain and hip pain of unclear etiology the hospitalist group was called to assess patient for admission to the hospital.  On 6/10, patient informed hospitalist Dr. Cruzita Lederer of his wish to be transferred to [facility] for continued treatment. Will proceed with transfer request and keep patient updated on outcome.   Subjective / 24h Interval events: Patient was resting in bed with his wife at the bedside on rounds. Patient appeared restless and uncomfortable, with difficulty finding a comfortable position while laying. He also complained of the stretcher being uncomfortable. Informed him we will request to have him transferred to a hospital bed while he is boarding in the ED for added comfort. Patient voiced understanding. Informed nursing staff of request to provide a hospital bed for patient comfort.   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 Patient is ambulatory at baseline, and currently bed-bound requiring assistance with turning. -  He has an implanted spinal stimulator for chronic pain. His current level of pain  exceeds his baseline. - CT chest, abdomen/pelvis, left hip with finding of multiple apparent osteolytic lesions to scapula, manubrium, thoracic spine, pelvis and femur with new lesions in lungs and liver.  - Left femoral neck pathologic fracture. - Orthopedic surgery consulted. However, Greg Hughes is requesting transfer back to [facility] as he is an established patient with oncology there. Attempting to establish transfer to [facility] per patient request. - Continue with analgesia and symptomatic relief for comfort while transfer is pending. - NPO due to possibility of pending procedures.  Active Problems Essential hypertension: - Hypertension in ED likely secondary to pain. Otherwise normotensive.  - Continue home regimen of antihypertensives.  COPD without exacerbation: - No evidence of exacerbation on this encounter. - Flutter valve and incentive spirometry as needed.   Hyperlipidemia: - Continue simvastatin.  Chronic diastolic CHF / paroxysmal A. fib: - patient without clinical symptoms of CHF exacerbation, dyspnea, LE edema. - On ED admission, ECG showing normal sinus. On 6/10, ECG showing sinus tachycardia suspicious for A. Flutter. - Patient not on home anticoagulation.  - Due to possible surgical procedures pending, hold chemical anticoagulation for now.  - monitor for siigns of fluid overload. - SCDs, TED hose.   Gout: - Continue allopurinol.  BPH: - Continue finasteride.  Scheduled Meds: . allopurinol  300 mg Oral Daily  . diltiazem  180 mg Oral Daily  . finasteride  5 mg Oral Daily  . metoprolol succinate  50 mg Oral Daily  . pregabalin  25 mg Oral BID  . simvastatin  10 mg Oral q1800  . tamsulosin  0.8 mg Oral Daily   Continuous Infusions: PRN Meds:.acetaminophen **OR** acetaminophen, HYDROmorphone (DILAUDID) injection **OR** HYDROmorphone (DILAUDID) injection, ondansetron **OR** ondansetron (ZOFRAN) IV, polyethylene glycol  DVT prophylaxis: None currently. Possible  surgical intervention precluding chemical prophylaxis at this time.  Code Status: Full code Family Communication: Wife  Status is: Observation  The patient will require care spanning > 2 midnights and should be moved to inpatient because: Inpatient level of care appropriate due to severity of illness  Dispo: The patient is from: Home              Anticipated d/c is to: Home              Anticipated d/c date is: > 3 days              Patient currently is not medically stable to d/c.   Consultants:  Orthopedics. Much appreciated.  Procedures:  None.  Microbiology  Covid negative.  Antimicrobials: None   Objective: Vitals:   07/29/19 0618 07/29/19 0619 07/29/19 0620 07/29/19 0621  BP:   127/76   Pulse: (!) 103 (!) 103 (!) 104 (!) 105  Resp: 16 17 19 11   Temp:      TempSrc:      SpO2: 93% 93% 96% 97%  Weight:       No intake or output data in the 24 hours ending 07/29/19 1051 Filed Weights   07/28/19 1735  Weight: 79.4 kg    Examination:  Constitutional: NAD Eyes: no scleral icterus ENMT: Mucous membranes are moist.  Neck: normal, supple Respiratory: clear to auscultation bilaterally, no wheezing, no crackles. Normal respiratory effort. No accessory muscle use.  Cardiovascular: Regular rate and rhythm, no murmurs / rubs / gallops. No LE edema. Good peripheral pulses Abdomen: non distended, no tenderness. Bowel sounds positive.  Musculoskeletal:  no clubbing / cyanosis. Diffuse left hip pain with radiation to lower back and LLE. Skin: no rashes Neurologic: grossly intact.  Psychiatric: Normal judgment and insight. Alert and oriented x 3. Normal mood.    Data Reviewed: I have independently reviewed following labs and imaging studies   CBC: Recent Labs  Lab 07/28/19 1836 07/29/19 0834  WBC 16.0* 14.2*  NEUTROABS 10.6* 10.3*  HGB 14.1 14.5  HCT 42.0 43.0  MCV 89.9 91.1  PLT 262 096   Basic Metabolic Panel: Recent Labs  Lab 07/28/19 1836  07/29/19 0834  NA 138 138  K 3.4* 3.1*  CL 100 98  CO2 26 26  GLUCOSE 107* 104*  BUN 20 15  CREATININE 1.09 1.06  CALCIUM 10.9* 10.3  MG  --  1.8   Liver Function Tests: Recent Labs  Lab 07/28/19 1836 07/29/19 0834  AST 33 36  ALT 39 38  ALKPHOS 117 120  BILITOT 0.9 0.7  PROT 7.4 6.8  ALBUMIN 3.3* 3.3*   Coagulation Profile: Recent Labs  Lab 07/28/19 1836  INR 1.3*   HbA1C: No results for input(s): HGBA1C in the last 72 hours. CBG: No results for input(s): GLUCAP in the last 168 hours.  Recent Results (from the past 240 hour(s))  SARS Coronavirus 2 by RT PCR (hospital order, performed in [facility] hospital lab) Nasopharyngeal Nasopharyngeal Swab     Status: None   Collection Time: 07/29/19 12:27 AM   Specimen: Nasopharyngeal Swab  Result Value Ref Range Status   SARS Coronavirus 2 NEGATIVE NEGATIVE Final    Comment: (NOTE) SARS-CoV-2 target nucleic acids are NOT DETECTED.  The SARS-CoV-2 RNA is generally detectable in upper and lower respiratory specimens during the acute phase of infection. The lowest concentration of SARS-CoV-2 viral copies this assay can detect is 250 copies / mL. A negative result does not preclude SARS-CoV-2 infection and should not be used as the sole basis for treatment or other patient management decisions.  A negative result may occur with improper specimen collection / handling, submission of specimen other than nasopharyngeal swab, presence of viral mutation(s) within the areas targeted by this assay, and inadequate number of viral copies (<250 copies / mL). A negative result must be combined with clinical observations, patient history, and epidemiological information.  Fact Sheet for Patients:   StrictlyIdeas.no  Fact Sheet for Healthcare Providers: BankingDealers.co.za  This test is not yet approved or  cleared by the [place] FDA and has been authorized for detection  and/or diagnosis of SARS-CoV-2 by FDA under an Emergency Use Authorization (EUA).  This EUA will remain in effect (meaning this test can be used) for the duration of the COVID-19 declaration under Section 564(b)(1) of the Act, 21 U.S.C. section 360bbb-3(b)(1), unless the authorization is terminated or revoked sooner.  Performed at [facility], [place] [address] [place], [place] [address]      Radiology Studies: CT CHEST W CONTRAST  Result Date: 07/29/2019 CLINICAL DATA:  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 CONTRAST:  17mL OMNIPAQUE IOHEXOL 300 MG/ML  SOLN COMPARISON:  CT left hip, 07/29/2019, Greg lumbar spine, 07/28/2019 FINDINGS: CT CHEST FINDINGS Cardiovascular: Aortic atherosclerosis. Normal heart size. Three-vessel coronary artery calcifications and stents. No pericardial effusion. Mediastinum/Nodes: No enlarged mediastinal, hilar, or axillary  lymph nodes. There is thickening of the esophagus near the gastroesophageal junction, likely a small hiatal hernia (series 5, image 73). Thyroid gland and trachea demonstrate no significant findings. Lungs/Pleura: Mild centrilobular emphysema. Trace bilateral pleural effusions and bandlike scarring or atelectasis of the bilateral lung bases. Evidence of prior left lower lobe wedge resection. Nonspecific 6 mm pulmonary nodule of the right lung base, new compared to remote prior CT dated 05/30/2006 (series 4, image 108). Musculoskeletal: Lytic lesion involving the spine of the right scapula measuring 2.6 x 1.9 cm (series 3, image 1). Subtle lytic lesions involving the left manubrium measuring 1.4 x 1.3 cm (series 3, image 11) and the inner table of the inferior manubrium measuring  1.9 x 1.4 cm (series 3, image 16). There is a lytic osseous lesion involving the left aspect of the T9 and T10 vertebral bodies with and extraosseous soft tissue component, measuring 3.9 x 3.6 cm at the level of T10 (series 3, image 43). CT ABDOMEN PELVIS FINDINGS Hepatobiliary: There are multiple low-attenuation lesions of the liver, the majority of which appear to be simple cysts and stable compared to remote prior examination dated 05/30/2006, however there are multiple additional, ill-defined hypodense lesions, for example of the liver dome measuring 1.7 x 1.1 cm (series 3, image 46) and of the anterior right lobe of the liver measuring 1.6 x 1.5 cm (series 3, image 63). No gallstones, gallbladder wall thickening, or biliary dilatation. Pancreas: Unremarkable. No pancreatic ductal dilatation or surrounding inflammatory changes. Spleen: Normal in size without significant abnormality. Adrenals/Urinary Tract: Right adrenal nodule measuring 2.2 x 2.1 cm, enlarged compared to prior examination dated 05/30/2006 although low-attenuation, HU = 9, and very likely a benign adenoma (series 3, image 55). There are bilateral simple cysts of the kidneys. 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 Bladder is unremarkable. Stomach/Bowel: Stomach is within normal limits. Appendix appears normal. No evidence of bowel wall thickening, distention, or inflammatory changes. Sigmoid diverticulosis. Moderate burden of stool balls throughout the colon. Vascular/Lymphatic: Severe aortic atherosclerosis. No enlarged abdominal or pelvic lymph nodes. Reproductive: No mass or other abnormality. Other: No abdominal wall hernia or abnormality. Evidence of prior right inguinal hernia repair. No abdominopelvic ascites. Musculoskeletal: Lytic lesion of the right aspect of the ilium abutting the sacroiliac joint measuring 3.0 x 1.9 cm (series 3, image 101). Previously described lytic lesions involving the left hemi sacrum and adjacent  ilium, measuring 5.6 x 4.7 cm (series 3, image 100) and with a pathologic fracture of the left femoral neck, soft tissue mass measuring 5.5 x 3.8 cm (series 3, image 12). Lytic lesion of the right femoral neck with erosion of the posterior cortex measuring 4.0 x 3.5 cm (series 3, image 125). IMPRESSION: 1. Multifocal osseous metastatic disease as detailed above, including a previously identified pathologic fracture of the left femoral neck. There are multiple additional lesions of mechanical significance at risk for pathologic fracture, particularly of the right femoral neck and T9 and T10 vertebral bodies. 2. Ill-defined low-attenuation lesions of the liver consistent with hepatic metastatic disease. 3. There is no candidate primary lesion confidently identified in the chest, abdomen, or pelvis. 4. Thickening of the esophagus near the gastroesophageal junction, likely a small hiatal hernia, esophageal malignancy less favored and without secondary findings such as adjacent lymphadenopathy. 5. Nonspecific 6 mm pulmonary nodule of the right lung base, new compared to remote prior CT dated 05/30/2006 although generally an unlikely solitary manifestation of pulmonary metastatic disease. Attention on follow-up. 6. Right adrenal  nodule, which is enlarged compared to prior examination dated 05/30/2006 although low-attenuation and very likely a benign adenoma. Attention on follow-up. 7. There is a pathologic fracture of the left femoral neck with soft tissue mass measuring 5.5 x 3.8 cm. 8. Trace bilateral pleural effusions without obvious nodularity or other manifestations of pleural metastatic disease. 9. Evidence of prior left lower lobe wedge resection. 10. Other chronic, incidental, and postoperative findings as detailed above. Coronary artery disease. Emphysema (ICD10-J43.9). Aortic Atherosclerosis (ICD10-I70.0). Electronically Signed   By: Eddie Candle M.D.   On: 07/29/2019 08:29   Greg LUMBAR SPINE WO  CONTRAST  Result Date: 07/28/2019 CLINICAL DATA:  Back pain with left lower extremity weakness EXAM: MRI LUMBAR SPINE WITHOUT CONTRAST TECHNIQUE: Multiplanar, multisequence Greg imaging of the lumbar spine was performed. No intravenous contrast was administered. COMPARISON:  04/24/2017 FINDINGS: Some sequences had to be omitted due to SAR restrictions related to the patient's spinal stimulator. Segmentation: Standard. Alignment:  Physiologic. Vertebrae:  L3-5 PLIF.  No acute abnormality. Conus medullaris and cauda equina: Conus extends to the L1 level. Conus and cauda equina appear normal. Paraspinal and other soft tissues: Negative Disc levels: L1-L2: Normal disc space and facet joints. There is no spinal canal stenosis. No neural foraminal stenosis. L2-L3: Mild disc bulge. Endplate spurring. There is no spinal canal stenosis. Mild bilateral neural foraminal stenosis. L3-L4: PLIF. There is no spinal canal stenosis. Limited visualization of the neural foramina due to susceptibility effects from spinal hardware. Within that limitation, no visible neural foraminal stenosis. L4-L5: PLIF. There is no spinal canal stenosis. Limited visualization of the neural foramina due to susceptibility effects from spinal hardware. Within that limitation, no visible neural foraminal stenosis. L5-S1: Normal disc space and facet joints. There is no spinal canal stenosis. No neural foraminal stenosis. Visualized sacrum: Normal. IMPRESSION: 1. Scan time had to be limited due to SAR restrictions imposed by the patient's spinal stimulator device. This caused the examination to be truncated and of reduced image quality. 2. L3-5 PLIF without spinal canal stenosis. No visible foraminal stenosis within limitations caused by adjacent hardware. 3. Mild bilateral L2-3 neural foraminal stenosis. Electronically Signed   By: Ulyses Jarred M.D.   On: 07/28/2019 23:45   CT ABDOMEN PELVIS W CONTRAST  Result Date: 07/29/2019 CLINICAL DATA:  Osseous  metastatic disease involving the left pelvis and femur, assess for primary and staging EXAM: CT CHEST, ABDOMEN, AND PELVIS WITH CONTRAST TECHNIQUE: Multidetector CT imaging of the chest, abdomen and pelvis was performed following the standard protocol during bolus administration of intravenous contrast. CONTRAST:  140mL OMNIPAQUE IOHEXOL 300 MG/ML  SOLN COMPARISON:  CT left hip, 07/29/2019, Greg lumbar spine, 07/28/2019 FINDINGS: CT CHEST FINDINGS Cardiovascular: Aortic atherosclerosis. Normal heart size. Three-vessel coronary artery calcifications and stents. No pericardial effusion. Mediastinum/Nodes: No enlarged mediastinal, hilar, or axillary lymph nodes. There is thickening of the esophagus near the gastroesophageal junction, likely a small hiatal hernia (series 5, image 73). Thyroid gland and trachea demonstrate no significant findings. Lungs/Pleura: Mild centrilobular emphysema. Trace bilateral pleural effusions and bandlike scarring or atelectasis of the bilateral lung bases. Evidence of prior left lower lobe wedge resection. Nonspecific 6 mm pulmonary nodule of the right lung base, new compared to remote prior CT dated 05/30/2006 (series 4, image 108). Musculoskeletal: Lytic lesion involving the spine of the right scapula measuring 2.6 x 1.9 cm (series 3, image 1). Subtle lytic lesions involving the left manubrium measuring 1.4 x 1.3 cm (series 3, image 11) and the inner table  of the inferior manubrium measuring 1.9 x 1.4 cm (series 3, image 16). There is a lytic osseous lesion involving the left aspect of the T9 and T10 vertebral bodies with and extraosseous soft tissue component, measuring 3.9 x 3.6 cm at the level of T10 (series 3, image 43). CT ABDOMEN PELVIS FINDINGS Hepatobiliary: There are multiple low-attenuation lesions of the liver, the majority of which appear to be simple cysts and stable compared to remote prior examination dated 05/30/2006, however there are multiple additional, ill-defined  hypodense lesions, for example of the liver dome measuring 1.7 x 1.1 cm (series 3, image 46) and of the anterior right lobe of the liver measuring 1.6 x 1.5 cm (series 3, image 63). No gallstones, gallbladder wall thickening, or biliary dilatation. Pancreas: Unremarkable. No pancreatic ductal dilatation or surrounding inflammatory changes. Spleen: Normal in size without significant abnormality. Adrenals/Urinary Tract: Right adrenal nodule measuring 2.2 x 2.1 cm, enlarged compared to prior examination dated 05/30/2006 although low-attenuation, HU = 9, and very likely a benign adenoma (series 3, image 55). There are bilateral simple cysts of the kidneys. 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 Bladder is unremarkable. Stomach/Bowel: Stomach is within normal limits. Appendix appears normal. No evidence of bowel wall thickening, distention, or inflammatory changes. Sigmoid diverticulosis. Moderate burden of stool balls throughout the colon. Vascular/Lymphatic: Severe aortic atherosclerosis. No enlarged abdominal or pelvic lymph nodes. Reproductive: No mass or other abnormality. Other: No abdominal wall hernia or abnormality. Evidence of prior right inguinal hernia repair. No abdominopelvic ascites. Musculoskeletal: Lytic lesion of the right aspect of the ilium abutting the sacroiliac joint measuring 3.0 x 1.9 cm (series 3, image 101). Previously described lytic lesions involving the left hemi sacrum and adjacent ilium, measuring 5.6 x 4.7 cm (series 3, image 100) and with a pathologic fracture of the left femoral neck, soft tissue mass measuring 5.5 x 3.8 cm (series 3, image 12). Lytic lesion of the right femoral neck with erosion of the posterior cortex measuring 4.0 x 3.5 cm (series 3, image 125). IMPRESSION: 1. Multifocal osseous metastatic disease as detailed above, including a previously identified pathologic fracture of the left femoral neck. There are multiple additional lesions of mechanical  significance at risk for pathologic fracture, particularly of the right femoral neck and T9 and T10 vertebral bodies. 2. Ill-defined low-attenuation lesions of the liver consistent with hepatic metastatic disease. 3. There is no candidate primary lesion confidently identified in the chest, abdomen, or pelvis. 4. Thickening of the esophagus near the gastroesophageal junction, likely a small hiatal hernia, esophageal malignancy less favored and without secondary findings such as adjacent lymphadenopathy. 5. Nonspecific 6 mm pulmonary nodule of the right lung base, new compared to remote prior CT dated 05/30/2006 although generally an unlikely solitary manifestation of pulmonary metastatic disease. Attention on follow-up. 6. Right adrenal nodule, which is enlarged compared to prior examination dated 05/30/2006 although low-attenuation and very likely a benign adenoma. Attention on follow-up. 7. There is a pathologic fracture of the left femoral neck with soft tissue mass measuring 5.5 x 3.8 cm. 8. Trace bilateral pleural effusions without obvious nodularity or other manifestations of pleural metastatic disease. 9. Evidence of prior left lower lobe wedge resection. 10. Other chronic, incidental, and postoperative findings as detailed above. Coronary artery disease. Emphysema (ICD10-J43.9). Aortic Atherosclerosis (ICD10-I70.0). Electronically Signed   By: Eddie Candle M.D.   On: 07/29/2019 08:29   CT HIP LEFT WO CONTRAST  Result Date: 07/29/2019 CLINICAL DATA:  Severe hip pain. EXAM: CT  OF THE LEFT HIP WITHOUT CONTRAST TECHNIQUE: Multidetector CT imaging of the left hip was performed according to the standard protocol. Multiplanar CT image reconstructions were also generated. COMPARISON:  MRI dated June 2021 FINDINGS: Bones/Joint/Cartilage There is a lytic mass eroding the left sacrum and left iliac bone measuring approximately 6.5 cm. There is a lytic lesion involving the left femoral neck with an associated  pathologic fracture. Ligaments Suboptimally assessed by CT. Muscles and Tendons The muscles are unremarkable where visualized. Soft tissues There is a small fat containing left inguinal hernia. There is scattered colonic diverticula without CT evidence for diverticulitis. The bladder is distended. IMPRESSION: 1. Lytic lesion involving the left femoral neck with associated pathologic fracture. 2. There is an additional lytic lesion in the left iliac bone and left sacrum. 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 3. Diverticulosis without CT evidence for diverticulitis. 4. Distended urinary bladder. 5. Fat containing left inguinal hernia. These results will be called to the ordering clinician or representative by the Radiologist Assistant, and communication documented in the PACS or [employer]. Electronically Signed   By: Constance Holster M.D.   On: 07/29/2019 03:27     Marzetta Board, MD, PhD [facility]  Between 7 am - 7 pm I am available, please contact me via Amion or Securechat  Between 7 pm - 7 am I am not available, please contact night coverage MD/APP via Amion    [email or web address]

## 2019-07-29 NOTE — Progress Notes (Signed)
Attempted to get report from ED

## 2019-07-30 ENCOUNTER — Inpatient Hospital Stay (HOSPITAL_COMMUNITY): Payer: Medicare HMO

## 2019-07-30 ENCOUNTER — Encounter (HOSPITAL_COMMUNITY)
Admission: EM | Disposition: A | Discharge status: Home Health | Payer: Self-pay | Source: Home / Self Care | Attending: Internal Medicine

## 2019-07-30 ENCOUNTER — Other Ambulatory Visit: Payer: Self-pay

## 2019-07-30 ENCOUNTER — Encounter (HOSPITAL_COMMUNITY): Payer: Self-pay | Admitting: Internal Medicine

## 2019-07-30 ENCOUNTER — Inpatient Hospital Stay (HOSPITAL_COMMUNITY): Payer: Medicare HMO | Admitting: Anesthesiology

## 2019-07-30 ENCOUNTER — Ambulatory Visit: Payer: Medicare HMO | Admitting: Family Medicine

## 2019-07-30 DIAGNOSIS — M5442 Lumbago with sciatica, left side: Secondary | ICD-10-CM

## 2019-07-30 HISTORY — PX: HIP ARTHROPLASTY: SHX981

## 2019-07-30 LAB — CBC
HCT: 42.4 % (ref 39.0–52.0)
Hemoglobin: 14.3 g/dL (ref 13.0–17.0)
MCH: 30.4 pg (ref 26.0–34.0)
MCHC: 33.7 g/dL (ref 30.0–36.0)
MCV: 90.2 fL (ref 80.0–100.0)
Platelets: 250 10*3/uL (ref 150–400)
RBC: 4.7 MIL/uL (ref 4.22–5.81)
RDW: 13.9 % (ref 11.5–15.5)
WBC: 15.2 10*3/uL — ABNORMAL HIGH (ref 4.0–10.5)
nRBC: 0 % (ref 0.0–0.2)

## 2019-07-30 LAB — COMPREHENSIVE METABOLIC PANEL
ALT: 36 U/L (ref 0–44)
AST: 35 U/L (ref 15–41)
Albumin: 3.4 g/dL — ABNORMAL LOW (ref 3.5–5.0)
Alkaline Phosphatase: 121 U/L (ref 38–126)
Anion gap: 11 (ref 5–15)
BUN: 16 mg/dL (ref 8–23)
CO2: 25 mmol/L (ref 22–32)
Calcium: 10.5 mg/dL — ABNORMAL HIGH (ref 8.9–10.3)
Chloride: 101 mmol/L (ref 98–111)
Creatinine, Ser: 1.01 mg/dL (ref 0.61–1.24)
GFR calc Af Amer: >60 mL/min (ref >60)
GFR calc non Af Amer: >60 mL/min (ref >60)
Glucose, Bld: 117 mg/dL — ABNORMAL HIGH (ref 70–99)
Potassium: 3.1 mmol/L — ABNORMAL LOW (ref 3.5–5.1)
Sodium: 137 mmol/L (ref 135–145)
Total Bilirubin: 0.9 mg/dL (ref 0.3–1.2)
Total Protein: 7.3 g/dL (ref 6.5–8.1)

## 2019-07-30 LAB — SURGICAL PCR SCREEN
MRSA, PCR: NEGATIVE
Staphylococcus aureus: NEGATIVE

## 2019-07-30 SURGERY — HEMIARTHROPLASTY, HIP, DIRECT ANTERIOR APPROACH, FOR FRACTURE
Anesthesia: General | Site: Hip | Laterality: Left

## 2019-07-30 MED ORDER — DEXMEDETOMIDINE HCL IN NACL 80 MCG/20ML IV SOLN
INTRAVENOUS | Status: AC
  Administered 2019-07-30: 80 ug
  Filled 2019-07-30: qty 20

## 2019-07-30 MED ORDER — CHLORHEXIDINE GLUCONATE 0.12 % MT SOLN
OROMUCOSAL | Status: AC
  Administered 2019-07-30: 15 mL via OROMUCOSAL
  Filled 2019-07-30: qty 15

## 2019-07-30 MED ORDER — CHLORHEXIDINE GLUCONATE 0.12 % MT SOLN: 15.0000 mL | Freq: Once | OROMUCOSAL | Status: AC

## 2019-07-30 MED ORDER — DEXMEDETOMIDINE HCL 200 MCG/2ML IV SOLN
20.0000 ug | Freq: Once | INTRAVENOUS | Status: AC
  Administered 2019-07-30: 20 ug via INTRAVENOUS

## 2019-07-30 MED ORDER — SUGAMMADEX SODIUM 200 MG/2ML IV SOLN
INTRAVENOUS | Status: DC | PRN
  Administered 2019-07-30: 200 mg via INTRAVENOUS

## 2019-07-30 MED ORDER — DEXMEDETOMIDINE HCL 200 MCG/2ML IV SOLN
16.0000 ug | Freq: Once | INTRAVENOUS | Status: AC
  Administered 2019-07-30: 16 ug via INTRAVENOUS

## 2019-07-30 MED ORDER — FENTANYL CITRATE (PF) 100 MCG/2ML IJ SOLN
INTRAMUSCULAR | Status: AC
  Filled 2019-07-30: qty 2

## 2019-07-30 MED ORDER — SUCCINYLCHOLINE CHLORIDE 200 MG/10ML IV SOSY
PREFILLED_SYRINGE | INTRAVENOUS | Status: AC
  Filled 2019-07-30: qty 30

## 2019-07-30 MED ORDER — MENTHOL 3 MG MT LOZG: 1.0000 | LOZENGE | OROMUCOSAL | Status: DC | PRN

## 2019-07-30 MED ORDER — EPINEPHRINE PF 1 MG/ML IJ SOLN
INTRAMUSCULAR | Status: AC
  Filled 2019-07-30: qty 1

## 2019-07-30 MED ORDER — BUPIVACAINE HCL (PF) 0.5 % IJ SOLN
INTRAMUSCULAR | Status: AC
  Filled 2019-07-30: qty 30

## 2019-07-30 MED ORDER — METOCLOPRAMIDE HCL 5 MG PO TABS: 5.0000 mg | ORAL_TABLET | Freq: Three times a day (TID) | ORAL | Status: DC | PRN

## 2019-07-30 MED ORDER — DOCUSATE SODIUM 100 MG PO CAPS
100.0000 mg | ORAL_CAPSULE | Freq: Two times a day (BID) | ORAL | Status: DC
  Administered 2019-07-30 – 2019-08-01 (×5): 100 mg via ORAL
  Filled 2019-07-30 (×6): qty 1

## 2019-07-30 MED ORDER — ONDANSETRON HCL 4 MG/2ML IJ SOLN: 4.0000 mg | Freq: Four times a day (QID) | INTRAMUSCULAR | Status: DC | PRN

## 2019-07-30 MED ORDER — ALBUMIN HUMAN 5 % IV SOLN
INTRAVENOUS | Status: DC | PRN
Start: 2019-07-30 — End: 2019-07-30

## 2019-07-30 MED ORDER — PHENOL 1.4 % MT LIQD: 1.0000 | OROMUCOSAL | Status: DC | PRN

## 2019-07-30 MED ORDER — PROPOFOL 10 MG/ML IV BOLUS
INTRAVENOUS | Status: AC
  Filled 2019-07-30: qty 20

## 2019-07-30 MED ORDER — EPHEDRINE 5 MG/ML INJ
INTRAVENOUS | Status: AC
  Filled 2019-07-30: qty 30

## 2019-07-30 MED ORDER — MIDAZOLAM HCL 2 MG/2ML IJ SOLN
INTRAMUSCULAR | Status: AC
  Filled 2019-07-30: qty 2

## 2019-07-30 MED ORDER — ORAL CARE MOUTH RINSE: 15.0000 mL | Freq: Once | OROMUCOSAL | Status: AC

## 2019-07-30 MED ORDER — DEXAMETHASONE SODIUM PHOSPHATE 10 MG/ML IJ SOLN
INTRAMUSCULAR | Status: AC
  Filled 2019-07-30: qty 2

## 2019-07-30 MED ORDER — CEFAZOLIN SODIUM-DEXTROSE 2-4 GM/100ML-% IV SOLN
2.0000 g | Freq: Once | INTRAVENOUS | Status: AC
  Administered 2019-07-30: 2 g via INTRAVENOUS

## 2019-07-30 MED ORDER — EPHEDRINE SULFATE 50 MG/ML IJ SOLN
INTRAMUSCULAR | Status: DC | PRN
  Administered 2019-07-30 (×3): 5 mg via INTRAVENOUS

## 2019-07-30 MED ORDER — LIDOCAINE 2% (20 MG/ML) 5 ML SYRINGE
INTRAMUSCULAR | Status: DC | PRN
  Administered 2019-07-30: 60 mg via INTRAVENOUS

## 2019-07-30 MED ORDER — ONDANSETRON HCL 4 MG/2ML IJ SOLN
INTRAMUSCULAR | Status: DC | PRN
  Administered 2019-07-30: 4 mg via INTRAVENOUS

## 2019-07-30 MED ORDER — ROCURONIUM BROMIDE 10 MG/ML (PF) SYRINGE
PREFILLED_SYRINGE | INTRAVENOUS | Status: AC
  Filled 2019-07-30: qty 10

## 2019-07-30 MED ORDER — ONDANSETRON HCL 4 MG PO TABS: 4.0000 mg | ORAL_TABLET | Freq: Four times a day (QID) | ORAL | Status: DC | PRN

## 2019-07-30 MED ORDER — SODIUM CHLORIDE (PF) 0.9 % IJ SOLN
INTRAMUSCULAR | Status: DC | PRN
  Administered 2019-07-30: 30 mL

## 2019-07-30 MED ORDER — SODIUM CHLORIDE 0.9 % IR SOLN
Status: DC | PRN
  Administered 2019-07-30: 3000 mL

## 2019-07-30 MED ORDER — CEFAZOLIN SODIUM-DEXTROSE 2-4 GM/100ML-% IV SOLN
INTRAVENOUS | Status: AC
  Filled 2019-07-30: qty 100

## 2019-07-30 MED ORDER — ROCURONIUM BROMIDE 10 MG/ML (PF) SYRINGE
PREFILLED_SYRINGE | INTRAVENOUS | Status: AC
  Filled 2019-07-30: qty 20

## 2019-07-30 MED ORDER — FENTANYL CITRATE (PF) 100 MCG/2ML IJ SOLN
25.0000 ug | INTRAMUSCULAR | Status: DC | PRN
  Administered 2019-07-30 (×3): 50 ug via INTRAVENOUS

## 2019-07-30 MED ORDER — CHLORHEXIDINE GLUCONATE CLOTH 2 % EX PADS
6.0000 | MEDICATED_PAD | Freq: Every day | CUTANEOUS | Status: DC
  Administered 2019-07-30 – 2019-08-09 (×10): 6 via TOPICAL

## 2019-07-30 MED ORDER — METOCLOPRAMIDE HCL 5 MG/ML IJ SOLN: 5.0000 mg | Freq: Three times a day (TID) | INTRAMUSCULAR | Status: DC | PRN

## 2019-07-30 MED ORDER — ENOXAPARIN SODIUM 40 MG/0.4ML ~~LOC~~ SOLN
40.0000 mg | SUBCUTANEOUS | Status: DC
  Administered 2019-07-31: 40 mg via SUBCUTANEOUS
  Filled 2019-07-30: qty 0.4

## 2019-07-30 MED ORDER — FENTANYL CITRATE (PF) 250 MCG/5ML IJ SOLN
INTRAMUSCULAR | Status: AC
  Filled 2019-07-30: qty 5

## 2019-07-30 MED ORDER — ROCURONIUM BROMIDE 10 MG/ML (PF) SYRINGE
PREFILLED_SYRINGE | INTRAVENOUS | Status: DC | PRN
  Administered 2019-07-30: 20 mg via INTRAVENOUS
  Administered 2019-07-30: 50 mg via INTRAVENOUS

## 2019-07-30 MED ORDER — ONDANSETRON HCL 4 MG/2ML IJ SOLN
INTRAMUSCULAR | Status: AC
  Filled 2019-07-30: qty 4

## 2019-07-30 MED ORDER — HYDROMORPHONE HCL 2 MG PO TABS
2.0000 mg | ORAL_TABLET | ORAL | Status: DC | PRN
  Administered 2019-07-31 – 2019-08-09 (×13): 2 mg via ORAL
  Filled 2019-07-30 (×13): qty 1

## 2019-07-30 MED ORDER — FENTANYL CITRATE (PF) 100 MCG/2ML IJ SOLN
INTRAMUSCULAR | Status: DC | PRN
  Administered 2019-07-30: 50 ug via INTRAVENOUS
  Administered 2019-07-30: 200 ug via INTRAVENOUS

## 2019-07-30 MED ORDER — CEFAZOLIN SODIUM-DEXTROSE 2-4 GM/100ML-% IV SOLN
2.0000 g | Freq: Four times a day (QID) | INTRAVENOUS | Status: AC
  Administered 2019-07-30 – 2019-07-31 (×2): 2 g via INTRAVENOUS
  Filled 2019-07-30 (×2): qty 100

## 2019-07-30 MED ORDER — KETOROLAC TROMETHAMINE 30 MG/ML IJ SOLN
INTRAMUSCULAR | Status: DC | PRN
  Administered 2019-07-30: 30 mg via INTRAMUSCULAR

## 2019-07-30 MED ORDER — LIDOCAINE 2% (20 MG/ML) 5 ML SYRINGE
INTRAMUSCULAR | Status: AC
  Filled 2019-07-30: qty 5

## 2019-07-30 MED ORDER — BUPIVACAINE-EPINEPHRINE (PF) 0.5% -1:200000 IJ SOLN
INTRAMUSCULAR | Status: DC | PRN
  Administered 2019-07-30: 30 mL

## 2019-07-30 MED ORDER — PHENYLEPHRINE 40 MCG/ML (10ML) SYRINGE FOR IV PUSH (FOR BLOOD PRESSURE SUPPORT)
PREFILLED_SYRINGE | INTRAVENOUS | Status: AC
  Filled 2019-07-30: qty 60

## 2019-07-30 MED ORDER — ONDANSETRON HCL 4 MG/2ML IJ SOLN
INTRAMUSCULAR | Status: AC
  Filled 2019-07-30: qty 10

## 2019-07-30 MED ORDER — 0.9 % SODIUM CHLORIDE (POUR BTL) OPTIME
TOPICAL | Status: DC | PRN
  Administered 2019-07-30: 1000 mL

## 2019-07-30 MED ORDER — POTASSIUM CHLORIDE CRYS ER 20 MEQ PO TBCR: 40.0000 meq | EXTENDED_RELEASE_TABLET | Freq: Once | ORAL | Status: DC

## 2019-07-30 MED ORDER — LACTATED RINGERS IV SOLN: INTRAVENOUS | Status: DC

## 2019-07-30 MED ORDER — LACTATED RINGERS IV SOLN: INTRAVENOUS | Status: DC | PRN

## 2019-07-30 MED ORDER — PHENYLEPHRINE HCL-NACL 10-0.9 MG/250ML-% IV SOLN
INTRAVENOUS | Status: DC | PRN
  Administered 2019-07-30: 40 ug/min via INTRAVENOUS

## 2019-07-30 MED ORDER — PROPOFOL 10 MG/ML IV BOLUS
INTRAVENOUS | Status: DC | PRN
  Administered 2019-07-30: 100 mg via INTRAVENOUS

## 2019-07-30 SURGICAL SUPPLY — 61 items
APL PRP STRL LF DISP 70% ISPRP (MISCELLANEOUS) ×1
BLADE SAW SGTL 18X1.27X75 (BLADE) ×2 IMPLANT
BLADE SAW SGTL 18X1.27X75MM (BLADE) ×1
CEMENT BONE REFOBACIN R1X40 US (Cement) ×8 IMPLANT
CEMENT RESTRICTOR BONE PREP ST (KITS) ×1 IMPLANT
CHLORAPREP W/TINT 26 (MISCELLANEOUS) ×3 IMPLANT
COVER SURGICAL LIGHT HANDLE (MISCELLANEOUS) ×3 IMPLANT
DRAPE C-ARM 42X72 X-RAY (DRAPES) ×3 IMPLANT
DRAPE IMP U-DRAPE 54X76 (DRAPES) ×6 IMPLANT
DRAPE STERI IOBAN 125X83 (DRAPES) ×3 IMPLANT
DRAPE U-SHAPE 47X51 STRL (DRAPES) ×9 IMPLANT
DRESSING AQUACEL AG SP 3.5X10 (GAUZE/BANDAGES/DRESSINGS) IMPLANT
DRSG AQUACEL AG SP 3.5X10 (GAUZE/BANDAGES/DRESSINGS) ×3
ELECT BLADE 4.0 EZ CLEAN MEGAD (MISCELLANEOUS) ×3
ELECT REM PT RETURN 9FT ADLT (ELECTROSURGICAL) ×3
ELECTRODE BLDE 4.0 EZ CLN MEGD (MISCELLANEOUS) ×1 IMPLANT
ELECTRODE REM PT RTRN 9FT ADLT (ELECTROSURGICAL) ×1 IMPLANT
GLOVE BIO SURGEON STRL SZ8.5 (GLOVE) ×6 IMPLANT
GLOVE BIOGEL PI IND STRL 8.5 (GLOVE) ×1 IMPLANT
GLOVE BIOGEL PI INDICATOR 8.5 (GLOVE) ×2
GOWN STRL REUS W/ TWL LRG LVL3 (GOWN DISPOSABLE) ×2 IMPLANT
GOWN STRL REUS W/TWL 2XL LVL3 (GOWN DISPOSABLE) ×3 IMPLANT
GOWN STRL REUS W/TWL LRG LVL3 (GOWN DISPOSABLE) ×6
GUIDEROD T2 3X1000 (ROD) ×2 IMPLANT
HANDPIECE INTERPULSE COAX TIP (DISPOSABLE) ×3
HEAD MODULAR ENDO (Orthopedic Implant) ×3 IMPLANT
HEAD UNPLR 50XMDLR STRL HIP (Orthopedic Implant) IMPLANT
HOOD PEEL AWAY FACE SHEILD DIS (HOOD) ×6 IMPLANT
JET LAVAGE IRRISEPT WOUND (IRRIGATION / IRRIGATOR) ×3
KIT BASIN OR (CUSTOM PROCEDURE TRAY) ×3 IMPLANT
KIT BONE PREP STRYKER (KITS) ×1
KIT TURNOVER KIT B (KITS) ×3 IMPLANT
LAVAGE JET IRRISEPT WOUND (IRRIGATION / IRRIGATOR) IMPLANT
MANIFOLD NEPTUNE II (INSTRUMENTS) ×3 IMPLANT
MARKER SKIN DUAL TIP RULER LAB (MISCELLANEOUS) ×3 IMPLANT
NDL SPNL 18GX3.5 QUINCKE PK (NEEDLE) ×1 IMPLANT
NEEDLE SPNL 18GX3.5 QUINCKE PK (NEEDLE) ×3 IMPLANT
NS IRRIG 1000ML POUR BTL (IV SOLUTION) ×3 IMPLANT
PACK TOTAL JOINT (CUSTOM PROCEDURE TRAY) ×3 IMPLANT
PACK UNIVERSAL I (CUSTOM PROCEDURE TRAY) ×3 IMPLANT
REAMER SHAFT BIXCUT (INSTRUMENTS) ×2 IMPLANT
SEALER BIPOLAR AQUA 6.0 (INSTRUMENTS) ×2 IMPLANT
SET HNDPC FAN SPRY TIP SCT (DISPOSABLE) ×1 IMPLANT
SLEEVE UNITRAX CTAPER (Orthopedic Implant) ×3 IMPLANT
SLEEVE UNITRAX CTAPER +5 (Orthopedic Implant) IMPLANT
STEM FEM CEMT 10.9X200X25 SZ5 (Stem) ×2 IMPLANT
SUCTION FRAZIER HANDLE 10FR (MISCELLANEOUS) ×3
SUCTION TUBE FRAZIER 10FR DISP (MISCELLANEOUS) ×1 IMPLANT
SUT ETHIBOND NAB CT1 #1 30IN (SUTURE) ×6 IMPLANT
SUT MNCRL AB 3-0 PS2 18 (SUTURE) ×3 IMPLANT
SUT MON AB 2-0 CT1 36 (SUTURE) ×3 IMPLANT
SUT VIC AB 1 CT1 27 (SUTURE) ×3
SUT VIC AB 1 CT1 27XBRD ANBCTR (SUTURE) ×1 IMPLANT
SUT VIC AB 2-0 CT1 27 (SUTURE) ×3
SUT VIC AB 2-0 CT1 TAPERPNT 27 (SUTURE) ×1 IMPLANT
SUT VLOC 180 0 24IN GS25 (SUTURE) ×3 IMPLANT
SYR 50ML LL SCALE MARK (SYRINGE) ×3 IMPLANT
TOWEL GREEN STERILE (TOWEL DISPOSABLE) ×3 IMPLANT
TOWEL GREEN STERILE FF (TOWEL DISPOSABLE) ×3 IMPLANT
TOWER CARTRIDGE SMART MIX (DISPOSABLE) ×4 IMPLANT
TRAY FOLEY W/BAG SLVR 14FR LF (SET/KITS/TRAYS/PACK) ×2 IMPLANT

## 2019-07-30 NOTE — Progress Notes (Signed)
PROGRESS NOTE  Greg Hughes UUV:[id number] DOB: [birth year] DOA: 07/28/2019 PCP: Tonia Ghent, MD   LOS: 1 day   Brief Narrative / Interim history: 79 year old male with past medical history of coronary artery disease, benign prostatic hyperplasia, obstructive sleep apnea, gastroesophageal reflux disease, paroxysmal atrial fibrillation(on Xarelto),lung cancer(Dx 02/2019, Salivary gland of Lung),chronic low back pain status post spinal cord stimulatorplaced 1/2021presenting to [facility] emergency department with complaints of severe low back pain, hip pain and inability to walk.  Patient explains that in mid May, while he was mowing the lawn he felt that he had the sensation of his hip "popping out of socket." That point forward, the patient felt severe low back pain and bilateral hip pain. Patient was evaluated at [facility] emergency department on 5/15 during which bilateral hip x-ray and lumbar spine x-rays were performed and were found to be unremarkable for anything acute.  In the weeks that followed, patient complains of severe low back pain and primarily left hip pain. States that this pain is worse with any attempted movement and is associated with pins and needle sensations of both feet. Patient denies any associated fecal or urinary incontinence. Patient denies any dysuria or fever.  Patient did follow-up with his orthopedic provider [facility] on 5/21. No intervention or change in therapy was made and a referral was made for the patient to follow-up with a local pain management clinic.  In the weeks that followed as patient's pain continued to persist he also began to develop progressively increasing difficulty with ambulation. Patient initially attempted to use a cane initially, followed by transition to a walker for several weeks. Patient now reports that this week he has been using a wheelchair at home to get around the house.  Patient symptoms continue to worsen  to the point where he eventually presented again to [facility] emergency department for evaluation on 6/9. 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 Several doses of opiate-based analgesics need to be provided during the emergency department stay including 3 doses of intravenous morphine and 1 dose of intravenous Dilaudid.   Due to continued intractable low back pain and hip pain of unclear etiology the hospitalist group was called to assess patient for admission to the hospital.  On 6/10, patient informed hospitalist Dr. Cruzita Lederer of his wish to be transferred to [facility] for continued treatment. Unfortunately, [facility] declined transfer. Orthopedic Surgery consulted and proceeding with left Hemiarthroplasty of left hip on 6/11, today.  Subjective / 24h Interval events: Patient was resting in bed on morning rounds. He appeared comfortable and stated he slept well, and is not in any overt pain at this time. Discussed with patient upcoming surgery today and answered all questions. No further concerns.  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 Patient is ambulatory at baseline, and currently bed-bound requiring assistance with turning. - He has an implanted spinal stimulator for chronic pain. His level of pain in the ED  exceeds his baseline. Currently comfortable. - CT chest, abdomen/pelvis, left hip with finding of multiple apparent osteolytic lesions to scapula, manubrium,  thoracic spine, pelvis and  femur with new lesions in lungs and liver.  - Left femoral neck pathologic fracture. - Orthopedic surgery consulted. Following refusal for transfer to [facility], orthopedic surgery to proceed with left hemiarthroplasty of left hip.  - Keep pt NPO. Resume diet per Ortho surgery recommendations post-op.  - consult PT for post-operative evaluation in AM.  Active Problems Hypokalemia: - Potassium 3.1 again today. Replete with 40 KCl. - Check magnesium in am.   Essential hypertension: - Continued hypertension, possibly due to pain? - Continue home regimen of antihypertensives.  COPD without exacerbation: - No evidence of exacerbation on this encounter. - Flutter valve and incentive spirometry as needed.   Hyperlipidemia: - Continue simvastatin.  Chronic diastolic CHF / paroxysmal A. fib: - patient without clinical symptoms of CHF exacerbation, dyspnea, LE edema. - On ED admission, ECG showing normal sinus. On 6/10, ECG showing sinus tachycardia suspicious for A. Flutter. - Patient not on home anticoagulation.  - Due to possible surgical procedures pending, hold chemical anticoagulation for now.  - monitor for siigns of fluid overload. - SCDs, TED hose.   Gout: - Continue allopurinol.  BPH: - Continue finasteride.  Scheduled Meds: . allopurinol  300 mg Oral Daily  . diltiazem  180 mg Oral Daily  . finasteride  5 mg Oral Daily  . metoprolol succinate  50 mg Oral Daily  . pregabalin  25 mg Oral BID  . simvastatin  10 mg Oral q1800  . tamsulosin  0.8 mg Oral Daily   Continuous Infusions: PRN Meds:.acetaminophen **OR** acetaminophen, alum & mag hydroxide-simeth, HYDROmorphone (DILAUDID) injection **OR** HYDROmorphone (DILAUDID) injection, ondansetron **OR** ondansetron (ZOFRAN) IV, polyethylene glycol  DVT prophylaxis: SCDs. Left hemiarthroplasty left hip scheduled for today. Code Status: full code Family Communication: No family at bedside on rounds.  Status is:  Inpatient  Remains inpatient appropriate because:Inpatient level of care appropriate due to severity of illness   Dispo: The patient is from: Home              Anticipated d/c is to: SNF              Anticipated d/c date is: 3 days              Patient currently is not medically stable to d/c.   Consultants:  Orthopedic surgery, much appreciated.  Procedures:  Left Hemiarthroplasty left hip on 6/11  Microbiology  Covid negative.  Antimicrobials: None at this time.    Objective: Vitals:   07/29/19 1932 07/30/19 0016 07/30/19 0325 07/30/19 0847  BP: (!) 151/91 (!) 148/80 137/79 (!) 164/89  Pulse: 92 81 92 74  Resp: 18 16 16 17   Temp: 98.6 F (37 C) (!) 97.5 F (36.4 C) 97.9 F (36.6 C) 98.3 F (36.8 C)  TempSrc: Oral Oral Oral Oral  SpO2: 95% 92% 90% 98%  Weight:        Intake/Output Summary (Last 24 hours) at 07/30/2019 1034 Last data filed at 07/30/2019 0917 Gross per 24 hour  Intake --  Output 180 ml  Net -180 ml   Filed Weights   07/28/19 1735  Weight: 79.4 kg    Examination:  Constitutional: NAD Eyes: no scleral icterus ENMT: Mucous membranes are moist.  Neck: normal, supple Respiratory: clear to auscultation bilaterally, no wheezing, no crackles. Normal respiratory effort. No accessory muscle use.  Cardiovascular: Regular rate and rhythm, no murmurs / rubs / gallops. No LE edema. Good peripheral pulses Abdomen: non distended, no tenderness. Bowel sounds  positive.  Musculoskeletal: no clubbing / cyanosis.  Skin: no rashes Neurologic: Grossly non-focal. Psychiatric: Normal judgment and insight. Alert and oriented x 3. Normal mood.    Data Reviewed: I have independently reviewed following labs and imaging studies   CBC: Recent Labs  Lab 07/28/19 1836 07/29/19 0834 07/30/19 0416  WBC 16.0* 14.2* 15.2*  NEUTROABS 10.6* 10.3*  --   HGB 14.1 14.5 14.3  HCT 42.0 43.0 42.4  MCV 89.9 91.1 90.2  PLT 262 269 009   Basic Metabolic Panel: Recent  Labs  Lab 07/28/19 1836 07/29/19 0834 07/30/19 0416  NA 138 138 137  K 3.4* 3.1* 3.1*  CL 100 98 101  CO2 26 26 25   GLUCOSE 107* 104* 117*  BUN 20 15 16   CREATININE 1.09 1.06 1.01  CALCIUM 10.9* 10.3 10.5*  MG  --  1.8  --    Liver Function Tests: Recent Labs  Lab 07/28/19 1836 07/29/19 0834 07/30/19 0416  AST 33 36 35  ALT 39 38 36  ALKPHOS 117 120 121  BILITOT 0.9 0.7 0.9  PROT 7.4 6.8 7.3  ALBUMIN 3.3* 3.3* 3.4*   Coagulation Profile: Recent Labs  Lab 07/28/19 1836  INR 1.3*   HbA1C: No results for input(s): HGBA1C in the last 72 hours. CBG: No results for input(s): GLUCAP in the last 168 hours.  Recent Results (from the past 240 hour(s))  SARS Coronavirus 2 by RT PCR (hospital order, performed in [facility] hospital lab) Nasopharyngeal Nasopharyngeal Swab     Status: None   Collection Time: 07/29/19 12:27 AM   Specimen: Nasopharyngeal Swab  Result Value Ref Range Status   SARS Coronavirus 2 NEGATIVE NEGATIVE Final    Comment: (NOTE) SARS-CoV-2 target nucleic acids are NOT DETECTED.  The SARS-CoV-2 RNA is generally detectable in upper and lower respiratory specimens during the acute phase of infection. The lowest concentration of SARS-CoV-2 viral copies this assay can detect is 250 copies / mL. A negative result does not preclude SARS-CoV-2 infection and should not be used as the sole basis for treatment or other patient management decisions.  A negative result may occur with improper specimen collection / handling, submission of specimen other than nasopharyngeal swab, presence of viral mutation(s) within the areas targeted by this assay, and inadequate number of viral copies (<250 copies / mL). A negative result must be combined with clinical observations, patient history, and epidemiological information.  Fact Sheet for Patients:   StrictlyIdeas.no  Fact Sheet for Healthcare  Providers: BankingDealers.co.za  This test is not yet approved or  cleared by the [place] FDA and has been authorized for detection and/or diagnosis of SARS-CoV-2 by FDA under an Emergency Use Authorization (EUA).  This EUA will remain in effect (meaning this test can be used) for the duration of the COVID-19 declaration under Section 564(b)(1) of the Act, 21 U.S.C. section 360bbb-3(b)(1), unless the authorization is terminated or revoked sooner.  Performed at [facility], [place] [address] [place], [place] [address]      Radiology Studies: CT FEMUR LEFT WO CONTRAST  Result Date: 07/30/2019 CLINICAL DATA:  Pathologic fracture of the left femur EXAM: CT OF THE LOWER LEFT EXTREMITY WITHOUT CONTRAST TECHNIQUE: Multidetector CT imaging of the lower left extremity was performed according to the standard protocol. COMPARISON:  CT and x-ray 07/29/2019 FINDINGS: Bones/Joint/Cartilage Expansile lytic mass centered at the left femoral neck with comminuted pathologic fracture involving the basicervical and intertrochanteric aspects of the femur. Fracture is mildly impacted.  No significant displacement or angulation. Left femoral neck mass with prominent soft tissue component emanating from the posterior cortex in total measures approximately 5.6 x 4.2 x 4.5 cm (series 2, image 30; series 5, image 50). Known lytic lesion of the left ilium is partially visualized at the edge of the field of view (series 1, image 3), more fully characterized on CT abdomen-pelvis 07/29/2019. No additional lytic or sclerotic bony lesions are seen within the field of view. Mild joint space narrowing of the left hip. No dislocation. No evidence of femoral head avascular necrosis. Ligaments Suboptimally assessed by CT. Muscles and Tendons Soft tissue mass results in mass effect on the adjacent left quadratus femoris muscle without obvious muscular invasion, although evaluation is limited with  noncontrast CT. Musculotendinous structures appear intact within the limitations of CT. Soft tissues No additional soft tissue masses. No fluid collection. Sigmoid diverticulosis is noted. IMPRESSION: 1. Expansile lytic mass centered at the left femoral neck with comminuted pathologic fracture involving the basicervical and intertrochanteric aspects of the femur. Fracture is mildly impacted. No significant displacement or angulation. 2. Known lytic lesion of the left ilium is partially visualized at the edge of the field of view, more fully characterized on CT abdomen-pelvis 07/29/2019. Electronically Signed   By: Davina Poke D.O.   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 COMPARISON:  CT 07/29/2019 FINDINGS: Bones/Joint/Cartilage Lytic mildly expansile mass centered within the intertrochanteric aspect of the proximal right femur measuring 4.6 x 3.4 x 5.7 cm (series 3, image 33; series 9, image 72). There is significant cortical breakthrough of the posterior and superior cortex of the femoral neck (series 3, image 32; series 7, image 66). Mass occupies the full diameter of the inferior femoral neck. Small extraosseous soft tissue component along the posterior femoral neck is suggested with mild mass effect on the traversing quadratus femoris muscle. No complete pathologic fracture is evident at this time. Known lytic mass in the posterior right ilium was not included within the field of view. Ill-defined area of lucency within the central aspect of the distal femoral metaphysis measuring 1.7 x 1.1 x 1.1 cm (series 3, image 155; series 7, image 62) is nonspecific and may reflect a area of prominent marrow fat. A small lytic  lesion not excluded. No additional areas of lytic or sclerotic bone lesion is identified. Ligaments Suboptimally assessed by CT. Muscles and Tendons No acute myotendinous abnormality. Soft tissues No soft tissue fluid collection. IMPRESSION: 1. Lytic mildly expansile mass centered within the intertrochanteric aspect of the proximal right femur measuring up to 5.7 cm with significant cortical breakthrough of the posterior and superior cortex of the femoral neck. Small extraosseous soft tissue component along the posterior femoral neck is suggested with mild mass effect on the traversing quadratus femoris muscle. This lesion is at high risk of pathologic fracture. 2. Ill-defined 1.7 cm area of lucency within the central aspect of the distal femoral metaphysis is nonspecific and may reflect a area of prominent marrow fat. A small lytic lesion not excluded. MRI could be performed for confirmation, as clinically indicated. Electronically Signed   By: Davina Poke D.O.   On: 07/30/2019 08:36   DG HIP UNILAT WITH PELVIS 2-3 VIEWS LEFT  Result  Date: 07/29/2019 CLINICAL DATA:  Recent falls EXAM: DG HIP (WITH OR WITHOUT PELVIS) 2-3V LEFT COMPARISON:  None. FINDINGS: Frontal pelvis as well as frontal and lateral left hip images were obtained. There is a comminuted fracture of the basicervical and intertrochanteric regions the proximal left femur. There is slight displacement of fracture fragments at the basicervical femoral neck region. There is inferior avulsion of the lesser trochanter. No other fractures are evident. No dislocation. There is mild symmetric narrowing of each hip joint. Postoperative change noted at L4, L5, S1. A stimulator device is present over the right iliac crest. There are surgical clips in the right inguinal region. IMPRESSION: Comminuted fracture of the proximal left femur with basicervical intertrochanteric components. Avulsion lesser trochanter. Mild displacement of fracture fragments along  the lateral portion of the basicervical portion of the fracture. No other fractures. No dislocation. Mild symmetric narrowing each hip joint. Electronically Signed   By: Lowella Grip III M.D.   On: 07/29/2019 13:17   DG FEMUR MIN 2 VIEWS LEFT  Result Date: 07/29/2019 CLINICAL DATA:  Proximal femur fracture following fall EXAM: LEFT FEMUR 2 VIEWS COMPARISON:  Pelvis and left hip radiographs July 29, 2019 FINDINGS: Frontal and lateral views were obtained. There is a comminuted basicervical and intertrochanteric portions of the proximal left femur. No inferior displacement of the lesser trochanter. No more distal fractures are evident. No dislocation. There is mild narrowing of the left hip joint. The knee joint region appears normal. No joint effusion. Foci of arterial vascular calcification noted. IMPRESSION: Comminuted fracture proximal left femur. Note that there is avulsion with inferior displacement of the lesser trochanter on the left. No new more distal fractures are evident. No dislocation. Mild narrowing left hip joint. Foci of arterial vascular calcification noted. Electronically Signed   By: Lowella Grip III M.D.   On: 07/29/2019 13:15      Marzetta Board, MD, PhD [facility]  Between 7 am - 7 pm I am available, please contact me via Amion or Securechat  Between 7 pm - 7 am I am not available, please contact night coverage MD/APP via Amion    [email or web address]

## 2019-07-30 NOTE — Op Note (Addendum)
OPERATIVE REPORT   SURGEON: Rod Can, MD   ASSISTANT: Katy Apo, RNFA.  PREOPERATIVE DIAGNOSIS: Pathologic Left femoral neck / intertrochanteric femur fracture.   POSTOPERATIVE DIAGNOSIS: Pathologic Left femoral neck / intertrochanteric femur fracture.   PROCEDURE: 1. Open biopsy of left femoral bone lesion. 2. Left hip hemiarthroplasty, posterior approach.   IMPLANTS: [employer] Omnifit cemented stem, size 5, 200 mm. 50 mm Metal head ball with +5 mm spacer. Zimmer Refobacin bone cement R.   ANESTHESIA:  General  ESTIMATED BLOOD LOSS:-300 mL    ANTIBIOTICS: 2 g Ancef.  DRAINS: None.  COMPLICATIONS: None.   CONDITION: PACU - hemodynamically stable.    BRIEF CLINICAL NOTE: Greg Hughes is a 79 y.o. male with a history of lung cancer status post partial resection about 5 months ago at an outside facility. He states that his cancer was apparently in remission.  About 2 months ago, he began having increasing left hip pain.  Over the past several days, his left hip began hurting worse.  He had inability to weight-bear, and came to the emergency department at [facility].  X-rays and CT scan revealed pathologic fracture of left femoral neck and intertrochanteric region.  Impending fracture present in the right proximal femur as well.  Also found to have multiple other metastatic lesions.  We discussed the risks, benefits, and alternatives to open biopsy and left hip hemiarthroplasty.  We will plan for prophylactic intramedullary fixation of the right femur next week.  The risks, benefits, and alternatives to the procedure were explained, and the patient elected to proceed.  PROCEDURE IN DETAIL: Surgical site was marked by myself. Once inside the operative room, general anesthesia was induced and a foley catheter was inserted. The patient was then positioned in the lateral  decubitus position. Axillary roll was placed. Patient was secured with a hip positioner. All bony prominences were well padded. The hip was prepped and draped in the normal sterile surgical fashion. A time-out was called verifying side and site of surgery. The patient received IV antibiotics within 60 minutes of beginning the procedure.  Posterior approach to the hip was performed. The capsule and short external rotators were taken down in one layer. The sciatic nerve was palpated and protected throughout the case. Periarticular tissues were edematous.  Pathologic fracture was identified. Saw was used to cut the small neck remnant. A corkscrew was placed into the head and the head was removed. The posterior cortex of the proximal femur and the entire calcar were absent secondary to extensive bony destruction.  I identified abnormal appearing tumor tissue, which which was sent for frozen section.  Pathologist called and indicated sample consistent with high-grade carcinoma.  The head was passed to the back table, measured, and then sent for pathology.  Acetabular retractors were then placed.  A 50 mm trial head was placed with excellent fit.  I then gained femoral exposure taking care to protect the abductors and greater trochanter.The capsule was peeled off the inner aspect of the greater trochanter. Rongeur was used to remove lateral cortical bone, and then the femoral canal finder was placed. Sequential broaching was performed up to a size 5. Guidewire was placed, and distal reaming was performed up to a size 13 mm, which would give 2 mm cement mantle.  The broach was replaced, and a trial head and neck were placed.  The hip was reduced. An AP xray was used to evaluate component sizing and position. The hip was dislocated and trial components  were removed.   I placed a clean lap sponge into the acetabulum.  The proximal femur was delivered into the wound.  The canal was cleaned with a pulse  irrigator.  Cement restrictor was placed.  The canal was then dried.  Cement was pressurized into the femur.  The real femoral component was cemented into place, taking care to hold appropriate version.  Excess cement was cleared.  I covered the proximal implant with cement.  50 mm head with different trial option spacers were used to determine appropriate leg length and stability.  The +5 mm was appropriate.  Leg lengths were palpated. At neutral abduction and 90 degrees of flexion, the hip was stable to greater than 70 degrees of internal rotation.  The hip was stable in potty position and in full extension / external rotation. Soft tissue tension was appropriate.  The wound was copiously irrigated with Irrisept solution followed by normal saline. Marcaine solution was injected into the periarticular soft tissue. Short external rotators were reapproximated with #1 Vicryl.  The wound was closed in layers using #1 Vicryl and V-Loc for the fascia, 2-0 Vicryl for the subcutaneous fat, 2-0 Monocryl for the deep dermal layer, 3-0 running Monocryl subcuticular stitch, and Dermabond for the skin. Once the glue was fully dried, an Aquacell Ag dressing was applied.  Hip abduction wedge was placed. The patient was transported to the recovery room in stable condition. Sponge, needle, and instrument counts were correct at the end of the case x2. The patient tolerated the procedure well and there were no known complications.  POSTOPERATIVE PLAN: Patient will be readmitted to the hospitalist service.  Surgical pathology pending. He may weight-bear as tolerated left lower extremity.  Posterior hip precautions.  Touchdown weightbearing right lower extremity.  Mobilize out of bed with physical therapy.  Begin Lovenox for DVT prophylaxis.  On Monday, we will plan for prophylactic intramedullary nailing of the right femur.  Hold Lovenox after Sunday a.m. dose.  N.p.o. after midnight Sunday night.

## 2019-07-30 NOTE — Transfer of Care (Signed)
Immediate Anesthesia Transfer of Care Note  Patient: Greg Hughes  Procedure(s) Performed: LEFT HEMIARTHROPLASTY HIP (Left Hip)  Patient Location: PACU  Anesthesia Type:General  Level of Consciousness: awake and confused  Airway & Oxygen Therapy: Patient Spontanous Breathing  Post-op Assessment: Report given to RN and Post -op Vital signs reviewed and stable  Post vital signs: Reviewed and stable  Last Vitals:  Vitals Value Taken Time  BP 99/85 07/30/19 1808  Temp    Pulse 97 07/30/19 1812  Resp 18 07/30/19 1812  SpO2 92 % 07/30/19 1812  Vitals shown include unvalidated device data.  Last Pain:  Vitals:   07/30/19 1419  TempSrc:   PainSc: 7       Patients Stated Pain Goal: 3 (42/39/53 2023)  Complications: No complications documented.

## 2019-07-30 NOTE — Interval H&P Note (Signed)
History and Physical Interval Note:  07/30/2019 2:19 PM  Greg Hughes  has presented today for surgery, with the diagnosis of Pathologic Left hip fracture.  The various methods of treatment have been discussed with the patient and family. After consideration of risks, benefits and other options for treatment, the patient has consented to  Procedure(s): LEFT HEMIARTHROPLASTY HIP (Left) as a surgical intervention.  The patient's history has been reviewed, patient examined, no change in status, stable for surgery.  I have reviewed the patient's chart and labs.  Questions were answered to the patient's satisfaction.    Patient was treated at [facility] January of this year for lung cancer.  He had a partial lung resection and completed chemotherapy.  He follows up with an oncologist at [facility].  He has a history of chronic low back pain with multiple procedures including spinal cord stimulator.  He has had left hip pain for couple of months.  Recently, the pain got a lot worse.  He is now unable to weight-bear.  Work-up in the emergency department at [facility] revealed pathologic fracture of the proximal femur on the left side.  Also found to have impending pathologic fracture of right proximal femur.  CT scans also showed vertebral compression fractures.  He was unable to have MRI scans due to spinal cord stimulator. Patient apparently requested transfer to [facility] for further care, and due to capacity issues, they declined.  Orthopedic consultation was placed for management of his left hip fracture.  We discussed open biopsy and hemiarthroplasty of the hip, so long as he does not have a primary bone tumor.  I also recommend intramedullary stabilization of the right femur early next week.  We did discuss the risk, benefits, and alternatives.  The biggest risks include failure, infection, and progression of the cancer.  The risks, benefits, and alternatives were discussed with the patient. There are risks  associated with the surgery including, but not limited to, problems with anesthesia (death), infection, instability (giving out of the joint), dislocation, differences in leg length/angulation/rotation, fracture of bones, loosening or failure of implants, hematoma (blood accumulation) which may require surgical drainage, blood clots, pulmonary embolism, nerve injury (foot drop and lateral thigh numbness), and blood vessel injury. The patient understands these risks and elects to proceed.    [name]

## 2019-07-30 NOTE — Plan of Care (Signed)

## 2019-07-30 NOTE — Plan of Care (Addendum)
CCMD called and reported 10 runs of SVT, at that time pt was repositioning d/t severe pain, when offered pain meds pt stated he didn't want them he just wanted a new bed. Repositioned pt with pillows, and provided heat packs for areas of discomfort. pt is now resting comfortably. Notified Dr. Sharlet Salina.   Pt had a few episodes of mild confused possibly due to uncontrolled pain. Pt was very uncomfortable and was trying to get out of the bed to move to a different location. Pt educated several times to stay in bed to prevent further injury.  Unsure if patient will have surgery today, so patient stayed NPO all night in hopes of the doctor rounding early this morning and determining if he will have surgery today or not.   All information passed on to day shift nurse.      Problem: Education: Goal: Knowledge of General Education information will improve Description: Including pain rating scale, medication(s)/side effects and non-pharmacologic comfort measures Outcome: Progressing   Problem: Activity: Goal: Risk for activity intolerance will decrease Outcome: Progressing   Problem: Pain Managment: Goal: General experience of comfort will improve Outcome: Progressing   Problem: Safety: Goal: Ability to remain free from injury will improve Outcome: Progressing   Problem: Skin Integrity: Goal: Risk for impaired skin integrity will decrease Outcome: Progressing

## 2019-07-30 NOTE — Progress Notes (Signed)
Patient refusing ice pack to surgical site.  Patient offered ice chips and sips.  He refused.  VSS.  Will continue to monitor

## 2019-07-30 NOTE — Progress Notes (Signed)
PROGRESS NOTE  Greg Hughes:[id number] DOB: [birth year] DOA: 07/28/2019 PCP: Tonia Ghent, MD   LOS: 1 day   Brief Narrative / Interim history: 79 year old male with CAD, BPH, OSA, paroxysmal A. fib on Xarelto, recent hospitalization at [facility] in January 2021 for a lung nodule status post resection (I am unable to find pathology reports), came to the hospital with severe low back pain, hip pain and inability to walk.  Several weeks ago while he was mowing the lawn felt like his hip was popping out of socket, followed by progressively worsening pain.  He was seen in the ED on midnight and x-ray was unremarkable.  Pain continued and came back to the hospital.  CT scan now concern for multiple metastatic osteolytic lesions and a pathological fracture.  Subjective / 24h Interval events: He is doing well this morning, had a fairly good night without much pain and awaiting surgery  Assessment & Plan: Principal Problem Left femoral neck pathological fracture-on admission patient underwent a CT of the left hip due to severe pain which showed a lytic lesion involving the left femoral neck with associated pathological fracture, also an additional lytic lesion in the left iliac bone and left sacrum, concerning for metastatic disease.  Orthopedic surgery consulted, appreciate input, he will be taken to the OR today.  Initially patient requested to be transferred to [facility] however due to declining transfer.  In addition, patient's wife and son were unaware of the patient's request and do not want patient to be transferred.  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  Imaging of the chest, abdomen, pelvis, femurs show extensive metastatic disease in the right femur, left femur, left iliac bone, left sacrum in multiple areas in the  vertebral bodies as well as right scapula, left manubrium.   -Imaging also showed thickening of the esophagus near the GE junction which likely represent a small hernia rather than malignancy without any surrounding lymphadenopathy -There is a 6 mm pulmonary nodule on the right lung base, which was not there in 2008 but not clear whether CT imaging at [facility] had this nodule -There are also ill-defined lesions in the liver consistent with hepatic metastatic disease -Importantly, imaging did not reveal a candidate primary lesion confidently identified in the chest, abdomen or pelvis. -To get biopsy today during surgery  Essential hypertension-continue home medications  COPD-no wheezing, no exacerbation  Paroxysmal A. fib-continue home medications, his Xarelto has been on hold for surgery  BPH-continue Flomax and Proscar  Chronic diastolic CHF -clinically appears euvolemic  Coronary artery disease-continue statin, beta-blockers, currently chest pain-free  HLD - continue statin  Gout - continue allopurinol   Scheduled Meds: . [MAR Hold] allopurinol  300 mg Oral Daily  . chlorhexidine  15 mL Mouth/Throat Once   Or  . mouth rinse  15 mL Mouth Rinse Once  . chlorhexidine      . [MAR Hold] diltiazem  180 mg Oral Daily  . [MAR Hold] finasteride  5 mg Oral Daily  . [MAR Hold] metoprolol succinate  50 mg Oral Daily  . potassium chloride  40 mEq Oral Once  . [MAR Hold] pregabalin  25 mg Oral BID  . [MAR Hold] simvastatin  10 mg Oral q1800  . [MAR Hold] tamsulosin  0.8 mg Oral Daily   Continuous Infusions: .  lactated ringers     PRN Meds:.[MAR Hold] acetaminophen **OR** [MAR Hold] acetaminophen, [MAR Hold] alum & mag hydroxide-simeth, [MAR Hold]  HYDROmorphone (DILAUDID) injection **OR** [MAR Hold]  HYDROmorphone (DILAUDID) injection, [MAR Hold] ondansetron **OR** [MAR Hold] ondansetron (ZOFRAN) IV, [MAR Hold] polyethylene glycol  DVT prophylaxis: SCDs Code Status: Full code Family  Communication: no family at bedside   Status is: Inpatient  Remains inpatient appropriate because:Inpatient level of care appropriate due to severity of illness   Dispo: The patient is from: Home              Anticipated d/c is to: SNF              Anticipated d/c date is: 3 days              Patient currently is not medically stable to d/c.  Consultants:  Orthopedic surgery   Procedures:  none  Microbiology  none  Antimicrobials: none    Objective: Vitals:   07/29/19 1932 07/30/19 0016 07/30/19 0325 07/30/19 0847  BP: (!) 151/91 (!) 148/80 137/79 (!) 164/89  Pulse: 92 81 92 74  Resp: 18 16 16 17   Temp: 98.6 F (37 C) (!) 97.5 F (36.4 C) 97.9 F (36.6 C) 98.3 F (36.8 C)  TempSrc: Oral Oral Oral Oral  SpO2: 95% 92% 90% 98%  Weight:        Intake/Output Summary (Last 24 hours) at 07/30/2019 1257 Last data filed at 07/30/2019 0917 Gross per 24 hour  Intake --  Output 180 ml  Net -180 ml   Filed Weights   07/28/19 1735  Weight: 79.4 kg    Examination:  Constitutional: NAD Eyes: no scleral icterus ENMT: Mucous membranes are moist.  Neck: normal, supple Respiratory: clear to auscultation bilaterally, no wheezing, no crackles. Normal respiratory effort. Cardiovascular: Regular rate and rhythm, no murmurs / rubs / gallops. No LE edema.  Abdomen: non distended, no tenderness. Bowel sounds positive.  Musculoskeletal: no clubbing / cyanosis.  Skin: no rashes Neurologic: non focal   Data Reviewed: I have independently reviewed following labs and imaging studies   CBC: Recent Labs  Lab 07/28/19 1836 07/29/19 0834 07/30/19 0416  WBC 16.0* 14.2* 15.2*  NEUTROABS 10.6* 10.3*  --   HGB 14.1 14.5 14.3  HCT 42.0 43.0 42.4  MCV 89.9 91.1 90.2  PLT 262 269 941   Basic Metabolic Panel: Recent Labs  Lab 07/28/19 1836 07/29/19 0834 07/30/19 0416  NA 138 138 137  K 3.4* 3.1* 3.1*  CL 100 98 101  CO2 26 26 25   GLUCOSE 107* 104* 117*  BUN 20 15 16    CREATININE 1.09 1.06 1.01  CALCIUM 10.9* 10.3 10.5*  MG  --  1.8  --    Liver Function Tests: Recent Labs  Lab 07/28/19 1836 07/29/19 0834 07/30/19 0416  AST 33 36 35  ALT 39 38 36  ALKPHOS 117 120 121  BILITOT 0.9 0.7 0.9  PROT 7.4 6.8 7.3  ALBUMIN 3.3* 3.3* 3.4*   Coagulation Profile: Recent Labs  Lab 07/28/19 1836  INR 1.3*   HbA1C: No results for input(s): HGBA1C in the last 72 hours. CBG: No results for input(s): GLUCAP in the last 168 hours.  Recent Results (from the past 240 hour(s))  SARS Coronavirus 2 by RT PCR (hospital order, performed in [facility] hospital lab) Nasopharyngeal Nasopharyngeal Swab     Status: None   Collection Time: 07/29/19 12:27 AM   Specimen: Nasopharyngeal Swab  Result Value  Ref Range Status   SARS Coronavirus 2 NEGATIVE NEGATIVE Final    Comment: (NOTE) SARS-CoV-2 target nucleic acids are NOT DETECTED.  The SARS-CoV-2 RNA is generally detectable in upper and lower respiratory specimens during the acute phase of infection. The lowest concentration of SARS-CoV-2 viral copies this assay can detect is 250 copies / mL. A negative result does not preclude SARS-CoV-2 infection and should not be used as the sole basis for treatment or other patient management decisions.  A negative result may occur with improper specimen collection / handling, submission of specimen other than nasopharyngeal swab, presence of viral mutation(s) within the areas targeted by this assay, and inadequate number of viral copies (<250 copies / mL). A negative result must be combined with clinical observations, patient history, and epidemiological information.  Fact Sheet for Patients:   StrictlyIdeas.no  Fact Sheet for Healthcare Providers: BankingDealers.co.za  This test is not yet approved or  cleared by the [place] FDA and has been authorized for detection and/or diagnosis of SARS-CoV-2 by FDA under an  Emergency Use Authorization (EUA).  This EUA will remain in effect (meaning this test can be used) for the duration of the COVID-19 declaration under Section 564(b)(1) of the Act, 21 U.S.C. section 360bbb-3(b)(1), unless the authorization is terminated or revoked sooner.  Performed at [facility], [place] [address], [place], [place] [address]   Surgical pcr screen     Status: None   Collection Time: 07/30/19 10:32 AM   Specimen: Nasal Mucosa; Nasal Swab  Result Value Ref Range Status   MRSA, PCR NEGATIVE NEGATIVE Final   Staphylococcus aureus NEGATIVE NEGATIVE Final    Comment: (NOTE) The Xpert SA Assay (FDA approved for NASAL specimens in patients 3 years of age and older), is one component of a comprehensive surveillance program. It is not intended to diagnose infection nor to guide or monitor treatment. Performed at [facility], [place] [address], [place], [place] [address]      Radiology Studies: CT FEMUR LEFT WO CONTRAST  Result Date: 07/30/2019 CLINICAL DATA:  Pathologic fracture of the left femur EXAM: CT OF THE LOWER LEFT EXTREMITY WITHOUT CONTRAST TECHNIQUE: Multidetector CT imaging of the lower left extremity was performed according to the standard protocol. COMPARISON:  CT and x-ray 07/29/2019 FINDINGS: Bones/Joint/Cartilage Expansile lytic mass centered at the left femoral neck with comminuted pathologic fracture involving the basicervical and intertrochanteric aspects of the femur. Fracture is mildly impacted. No significant displacement or angulation. Left femoral neck mass with prominent soft tissue component emanating from the posterior cortex in total measures approximately 5.6 x 4.2 x 4.5 cm (series 2, image 30; series 5, image 50). Known lytic lesion of the left ilium is partially visualized at the edge of the field of view (series 1, image 3), more fully characterized on CT abdomen-pelvis 07/29/2019. No additional lytic or sclerotic bony lesions are seen  within the field of view. Mild joint space narrowing of the left hip. No dislocation. No evidence of femoral head avascular necrosis. Ligaments Suboptimally assessed by CT. Muscles and Tendons Soft tissue mass results in mass effect on the adjacent left quadratus femoris muscle without obvious muscular invasion, although evaluation is limited with noncontrast CT. Musculotendinous structures appear intact within the limitations of CT. Soft tissues No additional soft tissue masses. No fluid collection. Sigmoid diverticulosis is noted. IMPRESSION: 1. Expansile lytic mass centered at the left femoral neck with comminuted pathologic fracture involving the basicervical and intertrochanteric aspects of the femur.  Fracture is mildly impacted. No significant displacement or angulation. 2. Known lytic lesion of the left ilium is partially visualized at the edge of the field of view, more fully characterized on CT abdomen-pelvis 07/29/2019. Electronically Signed   By: Davina Poke D.O.   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 COMPARISON:  CT 07/29/2019 FINDINGS: Bones/Joint/Cartilage Lytic mildly expansile mass centered within the intertrochanteric aspect of the proximal right femur measuring 4.6 x 3.4 x 5.7 cm (series 3, image 33; series 9, image 72). There is significant cortical breakthrough of the posterior and superior cortex of the femoral neck (series 3, image 32; series 7, image 66). Mass occupies the full diameter of the inferior femoral neck. Small extraosseous soft tissue component along the posterior femoral neck is suggested with mild mass effect on the traversing quadratus femoris muscle. No complete pathologic fracture is  evident at this time. Known lytic mass in the posterior right ilium was not included within the field of view. Ill-defined area of lucency within the central aspect of the distal femoral metaphysis measuring 1.7 x 1.1 x 1.1 cm (series 3, image 155; series 7, image 62) is nonspecific and may reflect a area of prominent marrow fat. A small lytic lesion not excluded. No additional areas of lytic or sclerotic bone lesion is identified. Ligaments Suboptimally assessed by CT. Muscles and Tendons No acute myotendinous abnormality. Soft tissues No soft tissue fluid collection. IMPRESSION: 1. Lytic mildly expansile mass centered within the intertrochanteric aspect of the proximal right femur measuring up to 5.7 cm with significant cortical breakthrough of the posterior and superior cortex of the femoral neck. Small extraosseous soft tissue component along the posterior femoral neck is suggested with mild mass effect on the traversing quadratus femoris muscle. This lesion is at high risk of pathologic fracture. 2. Ill-defined 1.7 cm area of lucency within the central aspect of the distal femoral metaphysis is nonspecific and may reflect a area of prominent marrow fat. A small lytic lesion not excluded. MRI could be performed for confirmation, as clinically indicated. Electronically Signed   By: Davina Poke D.O.   On: 07/30/2019 08:36    Marzetta Board, MD, PhD [facility]  Between 7 am - 7 pm I am available, please contact me via Amion or Securechat  Between 7 pm - 7 am I am not available, please contact night coverage MD/APP via Amion

## 2019-07-30 NOTE — Anesthesia Procedure Notes (Signed)
Procedure Name: Intubation Date/Time: 07/30/2019 3:22 PM Performed by: Clovis Cao, CRNA Pre-anesthesia Checklist: Patient identified, Emergency Drugs available, Suction available, Patient being monitored and Timeout performed Patient Re-evaluated:Patient Re-evaluated prior to induction Oxygen Delivery Method: Circle system utilized Preoxygenation: Pre-oxygenation with 100% oxygen Induction Type: IV induction Ventilation: Mask ventilation without difficulty Laryngoscope Size: Miller and Glidescope (First attempt with Sabra Heck 2 by CRNA- Grade 4 view with no attempt at placing ETT. Mask ventilation resumed. DL x 1 by MDA- grade 1 view with Glidescope. ETT easly placed) Grade View: Grade I Tube type: Oral Tube size: 7.5 mm Number of attempts: 2 (See note. Two DL, one attempt to place ETT) Airway Equipment and Method: Stylet and Video-laryngoscopy Placement Confirmation: ETT inserted through vocal cords under direct vision,  positive ETCO2 and breath sounds checked- equal and bilateral Secured at: 22 cm Tube secured with: Tape Dental Injury: Teeth and Oropharynx as per pre-operative assessment

## 2019-07-30 NOTE — Anesthesia Preprocedure Evaluation (Addendum)
Anesthesia Evaluation  Patient identified by MRN, date of birth, ID band Patient awake    Reviewed: Allergy & Precautions, NPO status , Patient's Chart, lab work & pertinent test results  Airway Mallampati: III  TM Distance: <3 FB Neck ROM: Limited    Dental  (+) Teeth Intact, Dental Advisory Given   Pulmonary COPD, Current Smoker,    breath sounds clear to auscultation       Cardiovascular hypertension, Pt. on medications and Pt. on home beta blockers + CAD and +CHF  + dysrhythmias Atrial Fibrillation  Rhythm:Regular Rate:Normal     Neuro/Psych negative neurological ROS  negative psych ROS   GI/Hepatic Neg liver ROS, GERD  ,  Endo/Other  negative endocrine ROS  Renal/GU Renal disease     Musculoskeletal negative musculoskeletal ROS (+)   Abdominal Normal abdominal exam  (+)   Peds  Hematology negative hematology ROS (+)   Anesthesia Other Findings   Reproductive/Obstetrics                          Anesthesia Physical Anesthesia Plan  ASA: III  Anesthesia Plan: General   Post-op Pain Management:    Induction: Intravenous  PONV Risk Score and Plan: 2 and Ondansetron, Dexamethasone and Treatment may vary due to age or medical condition  Airway Management Planned: Oral ETT  Additional Equipment: None  Intra-op Plan:   Post-operative Plan: Extubation in OR  Informed Consent: I have reviewed the patients History and Physical, chart, labs and discussed the procedure including the risks, benefits and alternatives for the proposed anesthesia with the patient or authorized representative who has indicated his/her understanding and acceptance.     Dental advisory given  Plan Discussed with: CRNA  Anesthesia Plan Comments: (Possible glidescope  Echo:  1. Left ventricular ejection fraction, by estimation, is 50 to 55%. The  left ventricle has low normal function. The left  ventricle has no regional  wall motion abnormalities. There is mild asymmetric left ventricular  hypertrophy of the basal-septal  segment. Left ventricular diastolic parameters are consistent with Grade I  diastolic dysfunction (impaired relaxation).  2. Right ventricular systolic function is mildly reduced. The right  ventricular size is mildly enlarged. Tricuspid regurgitation signal is  inadequate for assessing PA pressure.  3. Right atrial size was mildly dilated.  4. The mitral valve is grossly normal. Mild mitral valve regurgitation.  No evidence of mitral stenosis.  5. The aortic valve is tricuspid. Aortic valve regurgitation is mild.  Mild aortic valve sclerosis is present, with no evidence of aortic valve  stenosis.  6. Aortic dilatation noted. There is mild dilatation of the transverse  aorta measuring 33 mm.  7. The inferior vena cava is normal in size with <50% respiratory  variability, suggesting right atrial pressure of 8 mmHg. )      Anesthesia Quick Evaluation

## 2019-07-30 NOTE — Progress Notes (Signed)
Patient confused, combative, and impulsive on arrival to PACU.  Complaining of hip pain.  PRN Fentanyl was given x3 without successful relief of symptoms. Dr. Ola Spurr was notified and saw patient at bedside. Precedex administered by MD.

## 2019-07-31 LAB — PROTEIN ELECTROPHORESIS, SERUM
A/G Ratio: 0.9 (ref 0.7–1.7)
Albumin ELP: 3.3 g/dL (ref 2.9–4.4)
Alpha-1-Globulin: 0.4 g/dL (ref 0.0–0.4)
Alpha-2-Globulin: 1 g/dL (ref 0.4–1.0)
Beta Globulin: 1.6 g/dL — ABNORMAL HIGH (ref 0.7–1.3)
Gamma Globulin: 0.7 g/dL (ref 0.4–1.8)
Globulin, Total: 3.7 g/dL (ref 2.2–3.9)
Total Protein ELP: 7 g/dL (ref 6.0–8.5)

## 2019-07-31 LAB — CBC
HCT: 32 % — ABNORMAL LOW (ref 39.0–52.0)
Hemoglobin: 10.8 g/dL — ABNORMAL LOW (ref 13.0–17.0)
MCH: 30.9 pg (ref 26.0–34.0)
MCHC: 33.8 g/dL (ref 30.0–36.0)
MCV: 91.4 fL (ref 80.0–100.0)
Platelets: 218 10*3/uL (ref 150–400)
RBC: 3.5 MIL/uL — ABNORMAL LOW (ref 4.22–5.81)
RDW: 14 % (ref 11.5–15.5)
WBC: 13.1 10*3/uL — ABNORMAL HIGH (ref 4.0–10.5)
nRBC: 0 % (ref 0.0–0.2)

## 2019-07-31 LAB — BASIC METABOLIC PANEL
Anion gap: 10 (ref 5–15)
BUN: 21 mg/dL (ref 8–23)
CO2: 26 mmol/L (ref 22–32)
Calcium: 9.8 mg/dL (ref 8.9–10.3)
Chloride: 103 mmol/L (ref 98–111)
Creatinine, Ser: 1.21 mg/dL (ref 0.61–1.24)
GFR calc Af Amer: >60 mL/min (ref >60)
GFR calc non Af Amer: 57 mL/min — ABNORMAL LOW (ref >60)
Glucose, Bld: 126 mg/dL — ABNORMAL HIGH (ref 70–99)
Potassium: 3.4 mmol/L — ABNORMAL LOW (ref 3.5–5.1)
Sodium: 139 mmol/L (ref 135–145)

## 2019-07-31 MED ORDER — TRAZODONE HCL 50 MG PO TABS
50.0000 mg | ORAL_TABLET | Freq: Once | ORAL | Status: AC
  Administered 2019-07-31: 50 mg via ORAL
  Filled 2019-07-31: qty 1

## 2019-07-31 MED ORDER — POTASSIUM CHLORIDE CRYS ER 20 MEQ PO TBCR
40.0000 meq | EXTENDED_RELEASE_TABLET | Freq: Once | ORAL | Status: AC
  Administered 2019-07-31: 40 meq via ORAL
  Filled 2019-07-31: qty 2

## 2019-07-31 NOTE — TOC Initial Note (Signed)
Transition of Care [facility]) - Initial/Assessment Note    Patient Details  Name: Greg Hughes MRN: [id number] Date of Birth: [birth year]  Transition of Care [facility]) CM/SW Contact:    Alexander Mt, LCSW Phone Number: 07/31/2019, 10:11 AM  Clinical Narrative:                 CSW called and spoke with pt wife Greg Hughes via telephone at [phone]. Introduced self, role, reason for call. Pt from home w/ her, she assists with almost all ADL/IADLs. Confirmed home address and PCP. Pt has had a progressive decline in his ability to care for self. He had previously been using a cane to ambulate, then needed a walker and lately they have borrowed a wheelchair to get pt around. Pt has had one procedure completed, plan for further surgical intervention Monday.   We discussed pt needs, that therapy would evaluate his current status to make recommendations for next steps. Pt wife expresses that she would like for him to come home if able, as she thinks this would benefit his mental status and confusion. TOC team will continue to follow to support pt needs and to further discuss recommendations.   Pt wife preferred d/c plan also communicated to PT team. CSW also alerted MD that pt wife's phone is sending numbers to spam and so if someone is trying to get in touch with her it may go straight to voicemail but she will try and call back right away if a number/message is left.   Expected Discharge Plan: [place] (vs. SNF) Barriers to Discharge: Continued Medical Work up, Ship broker   Patient Goals and CMS Choice Patient states their goals for this hospitalization and ongoing recovery are:: for him to come home if possible CMS Medicare.gov Compare Post Acute Care list provided to:: Patient Represenative (must comment) (pt spouse) Choice offered to / list presented to : Spouse  Expected Discharge Plan and Services Expected Discharge Plan: [place] (vs.  SNF) In-house Referral: Clinical Social Work Discharge Planning Services: CM Consult Post Acute Care Choice: Home Health, Durable Medical Equipment (vs. SNF) Living arrangements for the past 2 months: Single Family Home    Prior Living Arrangements/Services Living arrangements for the past 2 months: Single Family Home Lives with:: Spouse Patient language and need for interpreter reviewed:: Yes Do you feel safe going back to the place where you live?: Yes      Need for Family Participation in Patient Care: Yes (Comment) (assistance w/ daily cares) Care giver support system in place?: Yes (comment) (pt wife) Current home services: DME Criminal Activity/Legal Involvement Pertinent to Current Situation/Hospitalization: No - Comment as needed  Activities of Daily Living Home Assistive Devices/Equipment: None ADL Screening (condition at time of admission) Patient's cognitive ability adequate to safely complete daily activities?: Yes Is the patient deaf or have difficulty hearing?: No Does the patient have difficulty seeing, even when wearing glasses/contacts?: No Does the patient have difficulty concentrating, remembering, or making decisions?: No Patient able to express need for assistance with ADLs?: Yes Does the patient have difficulty dressing or bathing?: Yes Independently performs ADLs?: No Does the patient have difficulty walking or climbing stairs?: Yes Weakness of Legs: Both Weakness of Arms/Hands: None  Permission Sought/Granted Permission sought to share information with : Family Supports Permission granted to share information with : No (pt only oriented to self)  Share Information with NAME: Greg Hughes  Permission granted to share info w AGENCY:  HH vs. SNF  Permission granted to share info w Relationship: wife  Permission granted to share info w Contact Information: [phone]  Emotional Assessment Appearance:: Other (Comment Required (telephonic assessment w/ pt  wife) Attitude/Demeanor/Rapport: Unable to Assess (telephonic assessment w/ pt wife) Affect (typically observed): Unable to Assess (telephonic assessment w/ pt wife) Orientation: : Oriented to Self, Fluctuating Orientation (Suspected and/or reported Sundowners) Alcohol / Substance Use: Not Applicable Psych Involvement:  (n/a)  Admission diagnosis:  Closed fracture of left femur (HCC) [S72.92XA] Intractable low back pain [M54.5] Acute midline low back pain with left-sided sciatica [M54.42] Pathological fracture in neoplastic disease, hip, unspecified, initial encounter for fracture [facility]) [N30.051T] Patient Active Problem List   Diagnosis Date Noted  . Chronic diastolic CHF (congestive heart failure) ([place]) 07/29/2019  . Coronary artery disease involving native coronary artery of native heart 07/29/2019  . Pathological fracture in neoplastic disease, hip, unspecified, initial encounter for fracture ([place]) 07/29/2019  . Malnutrition of mild degree ([place]) 07/21/2019  . Intractable low back pain 07/21/2019  . Abnormal CBC 10/15/2018  . Health care maintenance 10/03/2017  . PSA elevation 10/03/2017  . History of melanoma   . Advance care planning 06/08/2017  . Benign fibroma of prostate 04/17/2015  . Encounter for anticoagulation discussion and counseling 03/09/2015  . S/P cervical spinal fusion 06/16/2014  . Aortic atherosclerosis ([place]) 05/16/2014  . Adrenal adenoma 04/21/2014  . Cystic disease of kidney 03/03/2014  . Spinal stenosis of lumbar region without neurogenic claudication 10/07/2013  . Lung nodules 01/05/2013  . Medicare annual wellness visit, subsequent 09/30/2012  . Gout 09/30/2012  . AF (paroxysmal atrial fibrillation) ([place]) 11/18/2011  . COPD (chronic obstructive pulmonary disease) ([place]) 04/30/2010  . ERECTILE DYSFUNCTION 10/20/2007  . Benign prostatic hyperplasia with lower urinary tract symptoms 08/09/2007  . TOBACCO ABUSE 02/10/2007  . Microscopic hematuria 02/10/2007   . BACK PAIN, LUMBAR, CHRONIC 02/10/2007  . Mixed hyperlipidemia 02/05/2007  . Essential hypertension 02/05/2007   PCP:  Tonia Ghent, MD Pharmacy:   [facility] [address] [place] [address] Phone: [phone] Fax: [phone]   Readmission Risk Interventions Readmission Risk Prevention Plan 07/31/2019  Transportation Screening Complete  PCP or Specialist Appt within 5-7 Days Not Complete  Not Complete comments disposition pending  Home Care Screening Complete  Medication Review (RN CM) Referral to Pharmacy  Some recent data might be hidden

## 2019-07-31 NOTE — Progress Notes (Signed)
PROGRESS NOTE  Greg Hughes UYQ:[id number] DOB: Sep 25, 1940 DOA: 07/28/2019 PCP: Tonia Ghent, MD   LOS: 2 days   Brief Narrative / Interim history: 79 year old male with CAD, BPH, OSA, paroxysmal A. fib on Xarelto, recent hospitalization [facility] in January 2021 for a lung nodule status post resection (I am unable to find pathology reports), came to the hospital with severe low back pain, hip pain and inability to walk.  Several weeks ago while he was mowing the lawn felt like his hip was popping out of socket, followed by progressively worsening pain.  He was seen in the ED on midnight and x-ray was unremarkable.  Pain continued and came back to the hospital.  CT scan now concern for multiple metastatic osteolytic lesions and a pathological fracture.  Subjective / 24h Interval events: Had confusion overnight.  Much more appropriate this morning.  Assessment & Plan: Principal Problem Left femoral neck pathological fracture-on admission patient underwent a CT of the left hip due to severe pain which showed a lytic lesion involving the left femoral neck with associated pathological fracture, also an additional lytic lesion in the left iliac bone and left sacrum, concerning for metastatic disease.  Orthopedic surgery consulted, appreciate input.  He is status post left hip hemiarthroplasty, posterior approach and open biopsy of the left femoral bone lesion.  Plans are in place for IM nailing of the right femur prophylactically given significant lesions on the right.  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  Imaging of the chest, abdomen, pelvis, femurs show extensive metastatic disease in the right femur, left femur, left iliac bone, left  sacrum in multiple areas in the vertebral bodies as well as right scapula, left manubrium.   -Imaging also showed thickening of the esophagus near the GE junction which likely represent a small hernia rather than malignancy without any surrounding lymphadenopathy -There is a 6 mm pulmonary nodule on the right lung base, which was not there in 2008 but not clear whether CT imaging at [facility] had this nodule -There are also ill-defined lesions in the liver consistent with hepatic metastatic disease -Importantly, imaging did not reveal a candidate primary lesion confidently identified in the chest, abdomen or pelvis. -Biopsies were sent during surgery on 6/11, results pending  Essential hypertension-continue home medications  COPD-no wheezing, no exacerbation  Paroxysmal A. fib-continue home medications, his Xarelto has been on hold for surgery, he is no DVT prophylaxis and will hold Lovenox after Sunday morning's dose  BPH-continue Flomax and Proscar  Chronic diastolic CHF -clinically appears euvolemic, monitor strict I's and O's  Coronary artery disease-continue statin, beta-blockers, currently chest pain-free  HLD - continue statin  Gout - continue allopurinol  Historically, patient is also status post C5-C7 ACDF and L3-L5 posterior spinal and interbody  fusion and spinal cord simulator    Scheduled Meds: . allopurinol  300 mg Oral Daily  . Chlorhexidine Gluconate Cloth  6 each Topical Daily  . diltiazem  180 mg Oral Daily  . docusate sodium  100 mg Oral BID  . enoxaparin (LOVENOX) injection  40 mg Subcutaneous Q24H  . fentaNYL      . finasteride  5 mg Oral Daily  . metoprolol succinate  50 mg Oral Daily  . pregabalin  25 mg Oral BID  .  simvastatin  10 mg Oral q1800  . tamsulosin  0.8 mg Oral Daily   Continuous Infusions:  PRN Meds:.acetaminophen **OR** acetaminophen, alum & mag hydroxide-simeth, HYDROmorphone (DILAUDID) injection **OR** HYDROmorphone (DILAUDID) injection,  HYDROmorphone, menthol-cetylpyridinium **OR** phenol, metoCLOPramide **OR** metoCLOPramide (REGLAN) injection, ondansetron **OR** ondansetron (ZOFRAN) IV, polyethylene glycol  DVT prophylaxis: SCDs Code Status: Full code Family Communication: Son present at bedside  Status is: Inpatient  Remains inpatient appropriate because:Inpatient level of care appropriate due to severity of illness   Dispo: The patient is from: Home              Anticipated d/c is to: SNF              Anticipated d/c date is: 3 days              Patient currently is not medically stable to d/c.  Consultants:  Orthopedic surgery   Procedures:  Left hip hemiarthroplasty, posterior approach by Dr. Lyla Glassing 6/11  Microbiology  none  Antimicrobials: none    Objective: Vitals:   07/30/19 1930 07/30/19 2002 07/30/19 2354 07/31/19 0339  BP: 110/88 126/74 124/81 (!) 144/76  Pulse: 79 72 68 73  Resp: 14 12 18 16   Temp: 98 F (36.7 C) 97.7 F (36.5 C) 98.5 F (36.9 C) 98.4 F (36.9 C)  TempSrc:  Oral Oral Oral  SpO2: 92% 97% 98% 96%  Weight:        Intake/Output Summary (Last 24 hours) at 07/31/2019 0619 Last data filed at 07/31/2019 0445 Gross per 24 hour  Intake 2340 ml  Output 1630 ml  Net 710 ml   Filed Weights   07/28/19 1735  Weight: 79.4 kg    Examination:  Constitutional: NAD Eyes: no icterus ENMT: mmm Neck: normal, supple Respiratory: CTA biL, no wheezing, no crackles Cardiovascular: RRR, no MRG Abdomen: Soft, nontender, nondistended, bowel sounds positive Musculoskeletal: no clubbing / cyanosis.  Skin: No rashes appreciated Neurologic: No focal deficits  Data Reviewed: I have independently reviewed following labs and imaging studies   CBC: Recent Labs  Lab 07/28/19 1836 07/29/19 0834 07/30/19 0416 07/31/19 0451  WBC 16.0* 14.2* 15.2* 13.1*  NEUTROABS 10.6* 10.3*  --   --   HGB 14.1 14.5 14.3 10.8*  HCT 42.0 43.0 42.4 32.0*  MCV 89.9 91.1 90.2 91.4  PLT 262 269 250  381   Basic Metabolic Panel: Recent Labs  Lab 07/28/19 1836 07/29/19 0834 07/30/19 0416 07/31/19 0451  NA 138 138 137 139  K 3.4* 3.1* 3.1* 3.4*  CL 100 98 101 103  CO2 26 26 25 26   GLUCOSE 107* 104* 117* 126*  BUN 20 15 16 21   CREATININE 1.09 1.06 1.01 1.21  CALCIUM 10.9* 10.3 10.5* 9.8  MG  --  1.8  --   --    Liver Function Tests: Recent Labs  Lab 07/28/19 1836 07/29/19 0834 07/30/19 0416  AST 33 36 35  ALT 39 38 36  ALKPHOS 117 120 121  BILITOT 0.9 0.7 0.9  PROT 7.4 6.8 7.3  ALBUMIN 3.3* 3.3* 3.4*   Coagulation Profile: Recent Labs  Lab 07/28/19 1836  INR 1.3*   HbA1C: No results for input(s): HGBA1C in the last 72 hours. CBG: No results for input(s): GLUCAP in the last 168 hours.  Recent Results (from the past 240 hour(s))  SARS Coronavirus 2 by RT PCR (hospital order, performed in [facility] hospital lab) Nasopharyngeal Nasopharyngeal Swab     Status: None   Collection Time: 07/29/19  12:27 AM   Specimen: Nasopharyngeal Swab  Result Value Ref Range Status   SARS Coronavirus 2 NEGATIVE NEGATIVE Final    Comment: (NOTE) SARS-CoV-2 target nucleic acids are NOT DETECTED.  The SARS-CoV-2 RNA is generally detectable in upper and lower respiratory specimens during the acute phase of infection. The lowest concentration of SARS-CoV-2 viral copies this assay can detect is 250 copies / mL. A negative result does not preclude SARS-CoV-2 infection and should not be used as the sole basis for treatment or other patient management decisions.  A negative result may occur with improper specimen collection / handling, submission of specimen other than nasopharyngeal swab, presence of viral mutation(s) within the areas targeted by this assay, and inadequate number of viral copies (<250 copies / mL). A negative result must be combined with clinical observations, patient history, and epidemiological information.  Fact Sheet for Patients:     StrictlyIdeas.no  Fact Sheet for Healthcare Providers: BankingDealers.co.za  This test is not yet approved or  cleared by the [place] FDA and has been authorized for detection and/or diagnosis of SARS-CoV-2 by FDA under an Emergency Use Authorization (EUA).  This EUA will remain in effect (meaning this test can be used) for the duration of the COVID-19 declaration under Section 564(b)(1) of the Act, 21 U.S.C. section 360bbb-3(b)(1), unless the authorization is terminated or revoked sooner.  Performed at [facility], [place] [address] [place], [place] [address]   Surgical pcr screen     Status: None   Collection Time: 07/30/19 10:32 AM   Specimen: Nasal Mucosa; Nasal Swab  Result Value Ref Range Status   MRSA, PCR NEGATIVE NEGATIVE Final   Staphylococcus aureus NEGATIVE NEGATIVE Final    Comment: (NOTE) The Xpert SA Assay (FDA approved for NASAL specimens in patients 72 years of age and older), is one component of a comprehensive surveillance program. It is not intended to diagnose infection nor to guide or monitor treatment. Performed at [facility], [place] [address], [place], [place] [address]      Radiology Studies: DG HIP OPERATIVE UNILAT W OR W/O PELVIS LEFT  Result Date: 07/30/2019 CLINICAL DATA:  Left hemiarthroplasty. EXAM: OPERATIVE LEFT HIP (WITH PELVIS IF PERFORMED) TECHNIQUE: Fluoroscopic spot image(s) were submitted for interpretation post-operatively. COMPARISON:  Radiograph yesterday. FINDINGS: Three spot images obtained in the operating room during left hip arthroplasty. Fluoroscopy time not provided. IMPRESSION: Three spot images obtained in the operating room during left hip arthroplasty. Electronically Signed   By: Keith Rake M.D.   On: 07/30/2019 18:25    Marzetta Board, MD, PhD [facility]  Between 7 am - 7 pm I am available, please contact me via Amion or Securechat  Between 7  pm - 7 am I am not available, please contact night coverage MD/APP via Amion

## 2019-07-31 NOTE — Progress Notes (Signed)
Foley removed per MD order. Pt due to void by 12:30 pm. Day shift Nurse updated.

## 2019-07-31 NOTE — Plan of Care (Signed)

## 2019-07-31 NOTE — Progress Notes (Signed)
Subjective: 1 Day Post-Op Procedure(s) (LRB): LEFT HEMIARTHROPLASTY HIP (Left) Patient reports pain as moderate.   Reports back and hip pain.   Objective: Vital signs in last 24 hours: Temp:  [97.7 F (36.5 C)-98.7 F (37.1 C)] 98.7 F (37.1 C) (06/12 0737) Pulse Rate:  [68-100] 79 (06/12 0737) Resp:  [11-19] 15 (06/12 0737) BP: (99-166)/(66-89) 144/76 (06/12 0737) SpO2:  [92 %-99 %] 95 % (06/12 0737)  Intake/Output from previous day: 06/11 0701 - 06/12 0700 In: 2640 [P.O.:240; I.V.:1500; IV Piggyback:900] Out: 1630 [Urine:1330; Blood:300] Intake/Output this shift: No intake/output data recorded.  Recent Labs    07/28/19 1836 07/29/19 0834 07/30/19 0416 07/31/19 0451  HGB 14.1 14.5 14.3 10.8*   Recent Labs    07/30/19 0416 07/31/19 0451  WBC 15.2* 13.1*  RBC 4.70 3.50*  HCT 42.4 32.0*  PLT 250 218   Recent Labs    07/30/19 0416 07/31/19 0451  NA 137 139  K 3.1* 3.4*  CL 101 103  CO2 25 26  BUN 16 21  CREATININE 1.01 1.21  GLUCOSE 117* 126*  CALCIUM 10.5* 9.8   Recent Labs    07/28/19 1836  INR 1.3*    Neurologically intact ABD soft Neurovascular intact Sensation intact distally Intact pulses distally Dorsiflexion/Plantar flexion intact Incision: dressing C/D/I and no drainage No cellulitis present Compartment soft no sign of DVT   Assessment/Plan: 1 Day Post-Op Procedure(s) (LRB): LEFT HEMIARTHROPLASTY HIP (Left) Up with therapy WBAT LLE, TDWB RLE Plan OR Monday for prophylactic rodding R femur- TDWB in interim Lovenox for DVT ppx, hold for surgery Monday. Continue SCDs for mechanical ppx Pain mgmt Posterior hip precautions- abductor pillow  Anticipated LOS equal to or greater than 2 midnights due to - Age 9 and older with one or more of the following:  - Obesity  - Expected need for hospital services (PT, OT, Nursing) required for safe  discharge  - Anticipated need for postoperative skilled nursing care or inpatient  rehab     Cecilie Kicks 07/31/2019, 8:46 AM

## 2019-07-31 NOTE — Progress Notes (Signed)
This patient appears more calm and will be changed  to tele monitor sitter for safety reasons

## 2019-07-31 NOTE — Evaluation (Signed)
Physical Therapy Evaluation Patient Details Name: Greg Hughes MRN: [id number] DOB: 1940-08-03 Today's Date: 07/31/2019   History of Present Illness  Patient is a 79 year old male S/P left hemi arhtroplasty follwing a pathological fracture on the left. 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 He also has Metastatic disease of the spine. PMH: tabbaco abouse, right arm weakness, lumbg nodules, -cancer, hernia, cataracts, HTN,   Clinical Impression  Patient required mod-max a for all transfers. The patients wife would like him to go home but the patients son and himself question the level of care that can be provided. The patient will have another surgery on Monday. He did well today considering his surgery and co-morbidities but we will have a better idea afrter the next surgery what his safest course of activion will be.  For now we recommend SNF for safety but fully understand patients wife's desire to get him home in his own environment. Therapy will continue to work with the patient and his family to work towards the safest and best option for him following his surgery on Monday.     Follow Up Recommendations SNF;Supervision/Assistance - 24 hour     Equipment Recommendations  None recommended by PT    Recommendations for Other Services       Precautions / Restrictions Precautions Precautions: Posterior Hip;Fall Precaution Comments: reviewed with patient but will require further education. Sheet not given  Restrictions Weight Bearing Restrictions: Yes RLE Weight Bearing: Partial weight bearing RLE Partial Weight Bearing Percentage or Pounds: no percentage given in order  LLE Weight Bearing: Weight bearing as tolerated      Mobility  Bed Mobility Overal bed mobility: Needs Assistance Bed Mobility: Supine to Sit;Sit to Supine     Supine to sit: Max assist;+2 for physical assistance Sit to supine: Max assist;+2 for physical  assistance;+2 for safety/equipment   General bed mobility comments: max a to coot to the edge of the bed and to sit up. Max a +2 to get legs back into bed   Transfers Overall transfer level: Needs assistance   Transfers: Sit to/from Stand Sit to Stand: Mod assist;+2 physical assistance;+2 safety/equipment         General transfer comment: put some weight on right L> Able to stand to change pad under him. No significant increase in pain   Ambulation/Gait                Stairs            Wheelchair Mobility    Modified Rankin (Stroke Patients Only)       Balance Overall balance assessment: Needs assistance Sitting-balance support: No upper extremity supported;Feet supported Sitting balance-Leahy Scale: Good     Standing balance support: Bilateral upper extremity supported Standing balance-Leahy Scale: Poor Standing balance comment: uses walker for support                              Pertinent Vitals/Pain Pain Assessment: Faces Faces Pain Scale: Hurts whole lot Pain Location: left hip  Pain Descriptors / Indicators: Aching;Grimacing;Guarding;Sharp;Shooting Pain Intervention(s): Limited activity within patient's tolerance;Monitored during session;Patient requesting pain meds-RN notified    Home Living Family/patient expects to be discharged to:: Private residence Living Arrangements: Spouse/significant other Available Help at Discharge: Family Type of Home: House Home Access: Stairs to enter Entrance Stairs-Rails: Can reach both Entrance Stairs-Number of Steps: 1-2 Home Layout: One level  Additional Comments: wife would like to take the patient home     Prior Function Level of Independence: Independent         Comments: was doing daily activity like mowing the lawn prior to the fracture      Hand Dominance   Dominant Hand: Right    Extremity/Trunk Assessment   Upper Extremity Assessment Upper Extremity Assessment: Defer to  OT evaluation    Lower Extremity Assessment Lower Extremity Assessment: RLE deficits/detail;LLE deficits/detail RLE: Unable to fully assess due to pain LLE: Unable to fully assess due to pain       Communication   Communication: No difficulties  Cognition Arousal/Alertness: Awake/alert   Overall Cognitive Status: Within Functional Limits for tasks assessed                                 General Comments: Patient ealerier was reported to be confusded. He was aox3 during the session. he was pleasent and very motiviated.       General Comments      Exercises     Assessment/Plan    PT Assessment Patient needs continued PT services  PT Problem List Decreased strength;Decreased range of motion;Decreased activity tolerance;Decreased mobility;Decreased balance;Decreased knowledge of use of DME;Decreased knowledge of precautions;Pain       PT Treatment Interventions DME instruction;Gait training;Stair training;Functional mobility training;Therapeutic activities;Therapeutic exercise    PT Goals (Current goals can be found in the Care Plan section)  Acute Rehab PT Goals Patient Stated Goal: to go home  PT Goal Formulation: With patient Time For Goal Achievement: 07/31/19 Potential to Achieve Goals: Good    Frequency Min 3X/week   Barriers to discharge Decreased caregiver support Patient lives with wife but may require dependent care at this time     Co-evaluation               AM-PAC PT "6 Clicks" Mobility  Outcome Measure Help needed turning from your back to your side while in a flat bed without using bedrails?: A Lot Help needed moving from lying on your back to sitting on the side of a flat bed without using bedrails?: A Lot Help needed moving to and from a bed to a chair (including a wheelchair)?: Total Help needed standing up from a chair using your arms (e.g., wheelchair or bedside chair)?: Total Help needed to walk in hospital room?:  Total Help needed climbing 3-5 steps with a railing? : Total 6 Click Score: 8    End of Session Equipment Utilized During Treatment: Gait belt Activity Tolerance: No increased pain Patient left: in bed;with call bell/phone within reach;with family/visitor present Nurse Communication: Mobility status (nurse advised to leave restriant off until son leaves ) PT Visit Diagnosis: Unsteadiness on feet (R26.81);Other abnormalities of gait and mobility (R26.89);Pain;Muscle weakness (generalized) (M62.81) Pain - Right/Left: Left Pain - part of body: Hip    Time: 1200-1224 PT Time Calculation (min) (ACUTE ONLY): 24 min   Charges:   PT Evaluation $PT Eval Moderate Complexity: 1 Mod            Carney Living PT DPT  07/31/2019, 3:24 PM

## 2019-07-31 NOTE — Plan of Care (Signed)
  Problem: Activity: Goal: Risk for activity intolerance will decrease Outcome: Progressing   Problem: Coping: Goal: Level of anxiety will decrease Outcome: Progressing   Problem: Pain Managment: Goal: General experience of comfort will improve Outcome: Progressing   Problem: Safety: Goal: Ability to remain free from injury will improve Outcome: Progressing   Problem: Skin Integrity: Goal: Risk for impaired skin integrity will decrease Outcome: Progressing   

## 2019-08-01 LAB — CBC
HCT: 29.1 % — ABNORMAL LOW (ref 39.0–52.0)
Hemoglobin: 9.7 g/dL — ABNORMAL LOW (ref 13.0–17.0)
MCH: 30.5 pg (ref 26.0–34.0)
MCHC: 33.3 g/dL (ref 30.0–36.0)
MCV: 91.5 fL (ref 80.0–100.0)
Platelets: 223 10*3/uL (ref 150–400)
RBC: 3.18 MIL/uL — ABNORMAL LOW (ref 4.22–5.81)
RDW: 13.7 % (ref 11.5–15.5)
WBC: 14.3 10*3/uL — ABNORMAL HIGH (ref 4.0–10.5)
nRBC: 0 % (ref 0.0–0.2)

## 2019-08-01 LAB — BASIC METABOLIC PANEL
Anion gap: 10 (ref 5–15)
BUN: 19 mg/dL (ref 8–23)
CO2: 24 mmol/L (ref 22–32)
Calcium: 9.7 mg/dL (ref 8.9–10.3)
Chloride: 103 mmol/L (ref 98–111)
Creatinine, Ser: 1.08 mg/dL (ref 0.61–1.24)
GFR calc Af Amer: >60 mL/min (ref >60)
GFR calc non Af Amer: >60 mL/min (ref >60)
Glucose, Bld: 132 mg/dL — ABNORMAL HIGH (ref 70–99)
Potassium: 3.7 mmol/L (ref 3.5–5.1)
Sodium: 137 mmol/L (ref 135–145)

## 2019-08-01 MED ORDER — ENOXAPARIN SODIUM 40 MG/0.4ML ~~LOC~~ SOLN
40.0000 mg | SUBCUTANEOUS | Status: AC
  Administered 2019-08-01: 40 mg via SUBCUTANEOUS
  Filled 2019-08-01: qty 0.4

## 2019-08-01 MED ORDER — DIPHENHYDRAMINE HCL 25 MG PO CAPS
25.0000 mg | ORAL_CAPSULE | Freq: Once | ORAL | Status: AC
  Administered 2019-08-01: 25 mg via ORAL
  Filled 2019-08-01: qty 1

## 2019-08-01 MED ORDER — POLYETHYLENE GLYCOL 3350 17 G PO PACK
17.0000 g | PACK | Freq: Every day | ORAL | Status: DC
  Administered 2019-08-01 – 2019-08-07 (×6): 17 g via ORAL
  Filled 2019-08-01 (×7): qty 1

## 2019-08-01 NOTE — Progress Notes (Signed)
PROGRESS NOTE  Greg Hughes YCX:[id number] DOB: 1940/12/04 DOA: 07/28/2019 PCP: Tonia Ghent, MD   LOS: 3 days   Brief Narrative / Interim history: 79 year old male with CAD, BPH, OSA, paroxysmal A. fib on Xarelto, recent hospitalization at [facility] in January 2021 for a lung nodule status post resection (I am unable to find pathology reports), came to the hospital with severe low back pain, hip pain and inability to walk.  Several weeks ago while he was mowing the lawn felt like his hip was popping out of socket, followed by progressively worsening pain.  He was seen in the ED on midnight and x-ray was unremarkable.  Pain continued and came back to the hospital.  CT scan now concern for multiple metastatic osteolytic lesions and a pathological fracture.  Subjective / 24h Interval events: He tells me he had a rough night last night.  Has been sleeping intermittently and has been having left hip soreness.  Slightly confused believes the physical therapy was couple of days ago not yesterday  Assessment & Plan: Principal Problem Left femoral neck pathological fracture-on admission patient underwent a CT of the left hip due to severe pain which showed a lytic lesion involving the left femoral neck with associated pathological fracture, also an additional lytic lesion in the left iliac bone and left sacrum, concerning for metastatic disease.  Orthopedic surgery consulted, appreciate input.  He is status post left hip hemiarthroplasty, posterior approach and open biopsy of the left femoral bone lesion.  Plans are in place for IM nailing of the right femur prophylactically given significant lesions on the right.  This is scheduled for tomorrow  Active Problems Metastatic disease of unknown primary-patient has a lung nodule status post resection in January 2021.  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  Imaging of the chest, abdomen, pelvis, femurs show extensive metastatic disease in the right femur, left femur, left iliac bone, left sacrum in multiple areas in the vertebral bodies as well as right scapula, left manubrium.   -Imaging also showed thickening of the esophagus near the GE junction which likely represent a small hernia rather than malignancy without any surrounding lymphadenopathy -There is a 6 mm pulmonary nodule on the right lung base, which was not there in 2008 but not clear whether CT imaging at [facility] had this nodule -There are also ill-defined lesions in the liver consistent with hepatic metastatic disease -Importantly, imaging did not reveal a candidate primary lesion confidently identified in the chest, abdomen or pelvis. -Results from the biopsies are still pending this morning  Essential hypertension-continue home medications, blood pressure acceptable this morning  COPD-n no wheezing, this appears stable  Paroxysmal A. fib-continue home medications, his Xarelto has been on hold for surgery, Lovenox this morning but then stop in preparation for surgery tomorrow.  N.p.o. after midnight tonight  BPH-continue Flomax and Proscar  Chronic diastolic CHF -clinically appears euvolemic, monitor strict I's and O's.  Appears euvolemic this morning  Coronary artery disease-continue statin, beta-blockers, currently he is chest pain-free  HLD - continue statin  Gout - continue allopurinol  Constipation-change MiraLAX to scheduled  Historically, patient is also status post C5-C7 ACDF and L3-L5 posterior spinal and interbody  fusion and spinal cord simulator    Scheduled Meds: . allopurinol  300 mg Oral Daily  . Chlorhexidine Gluconate Cloth  6 each Topical Daily  . diltiazem  180  mg Oral Daily  . docusate sodium  100 mg Oral BID  . enoxaparin (LOVENOX) injection  40 mg Subcutaneous Q24H  . finasteride  5 mg Oral Daily  .  metoprolol succinate  50 mg Oral Daily  . pregabalin  25 mg Oral BID  . simvastatin  10 mg Oral q1800  . tamsulosin  0.8 mg Oral Daily   Continuous Infusions:  PRN Meds:.acetaminophen **OR** acetaminophen, alum & mag hydroxide-simeth, HYDROmorphone (DILAUDID) injection **OR** HYDROmorphone (DILAUDID) injection, HYDROmorphone, menthol-cetylpyridinium **OR** phenol, metoCLOPramide **OR** metoCLOPramide (REGLAN) injection, ondansetron **OR** ondansetron (ZOFRAN) IV, polyethylene glycol  DVT prophylaxis: SCDs Code Status: Full code Family Communication: No family present at bedside this morning  Status is: Inpatient  Remains inpatient appropriate because:Inpatient level of care appropriate due to severity of illness   Dispo: The patient is from: Home              Anticipated d/c is to: SNF              Anticipated d/c date is: 3 days              Patient currently is not medically stable to d/c.  Consultants:  Orthopedic surgery   Procedures:  Left hip hemiarthroplasty, posterior approach by Dr. Lyla Glassing 6/11  Microbiology  none  Antimicrobials: none    Objective: Vitals:   07/31/19 0737 07/31/19 1745 07/31/19 1924 08/01/19 0331  BP: (!) 144/76 124/67 (!) 141/70 128/68  Pulse: 79 84 87 78  Resp: 15 18 14 16   Temp: 98.7 F (37.1 C) 98.4 F (36.9 C) 98.3 F (36.8 C) 98.2 F (36.8 C)  TempSrc: Oral  Oral Oral  SpO2: 95% 92% 94% 93%  Weight:        Intake/Output Summary (Last 24 hours) at 08/01/2019 7342 Last data filed at 07/31/2019 0900 Gross per 24 hour  Intake 240 ml  Output --  Net 240 ml   Filed Weights   07/28/19 1735  Weight: 79.4 kg    Examination:  Constitutional: No distress, in bed Eyes: No scleral icterus ENMT: Moist mucous membranes Neck: normal, supple Respiratory: Clear bilaterally without wheezing or crackles Cardiovascular: Regular rate and rhythm, no murmurs appreciated.  No peripheral edema Abdomen: Soft, nontender, nondistended, bowel  sounds positive Musculoskeletal: no clubbing / cyanosis.  Skin: No rashes appreciated Neurologic: Nonfocal  Data Reviewed: I have independently reviewed following labs and imaging studies   CBC: Recent Labs  Lab 07/28/19 1836 07/29/19 0834 07/30/19 0416 07/31/19 0451  WBC 16.0* 14.2* 15.2* 13.1*  NEUTROABS 10.6* 10.3*  --   --   HGB 14.1 14.5 14.3 10.8*  HCT 42.0 43.0 42.4 32.0*  MCV 89.9 91.1 90.2 91.4  PLT 262 269 250 876   Basic Metabolic Panel: Recent Labs  Lab 07/28/19 1836 07/29/19 0834 07/30/19 0416 07/31/19 0451  NA 138 138 137 139  K 3.4* 3.1* 3.1* 3.4*  CL 100 98 101 103  CO2 26 26 25 26   GLUCOSE 107* 104* 117* 126*  BUN 20 15 16 21   CREATININE 1.09 1.06 1.01 1.21  CALCIUM 10.9* 10.3 10.5* 9.8  MG  --  1.8  --   --    Liver Function Tests: Recent Labs  Lab 07/28/19 1836 07/29/19 0834 07/30/19 0416  AST 33 36 35  ALT 39 38 36  ALKPHOS 117 120 121  BILITOT 0.9 0.7 0.9  PROT 7.4 6.8 7.3  ALBUMIN 3.3* 3.3* 3.4*   Coagulation Profile: Recent Labs  Lab 07/28/19 1836  INR 1.3*   HbA1C: No results for input(s): HGBA1C in the last 72 hours. CBG: No results for input(s): GLUCAP in the last 168 hours.  Recent Results (from the past 240 hour(s))  SARS Coronavirus 2 by RT PCR (hospital order, performed in [facility] hospital lab) Nasopharyngeal Nasopharyngeal Swab     Status: None   Collection Time: 07/29/19 12:27 AM   Specimen: Nasopharyngeal Swab  Result Value Ref Range Status   SARS Coronavirus 2 NEGATIVE NEGATIVE Final    Comment: (NOTE) SARS-CoV-2 target nucleic acids are NOT DETECTED.  The SARS-CoV-2 RNA is generally detectable in upper and lower respiratory specimens during the acute phase of infection. The lowest concentration of SARS-CoV-2 viral copies this assay can detect is 250 copies / mL. A negative result does not preclude SARS-CoV-2 infection and should not be used as the sole basis for treatment or other patient management  decisions.  A negative result may occur with improper specimen collection / handling, submission of specimen other than nasopharyngeal swab, presence of viral mutation(s) within the areas targeted by this assay, and inadequate number of viral copies (<250 copies / mL). A negative result must be combined with clinical observations, patient history, and epidemiological information.  Fact Sheet for Patients:   StrictlyIdeas.no  Fact Sheet for Healthcare Providers: BankingDealers.co.za  This test is not yet approved or  cleared by the [place] FDA and has been authorized for detection and/or diagnosis of SARS-CoV-2 by FDA under an Emergency Use Authorization (EUA).  This EUA will remain in effect (meaning this test can be used) for the duration of the COVID-19 declaration under Section 564(b)(1) of the Act, 21 U.S.C. section 360bbb-3(b)(1), unless the authorization is terminated or revoked sooner.  Performed at [facility], [place] [address] [place], [place] [address]   Surgical pcr screen     Status: None   Collection Time: 07/30/19 10:32 AM   Specimen: Nasal Mucosa; Nasal Swab  Result Value Ref Range Status   MRSA, PCR NEGATIVE NEGATIVE Final   Staphylococcus aureus NEGATIVE NEGATIVE Final    Comment: (NOTE) The Xpert SA Assay (FDA approved for NASAL specimens in patients 29 years of age and older), is one component of a comprehensive surveillance program. It is not intended to diagnose infection nor to guide or monitor treatment. Performed at [facility], [place] [address], [place], [place] [address]      Radiology Studies: No results found.  Marzetta Board, MD, PhD [facility]  Between 7 am - 7 pm I am available, please contact me via Amion or Securechat  Between 7 pm - 7 am I am not available, please contact night coverage MD/APP via Amion

## 2019-08-01 NOTE — Plan of Care (Signed)

## 2019-08-01 NOTE — Progress Notes (Signed)
° °  Subjective: 2 Days Post-Op Procedure(s) (LRB): LEFT HEMIARTHROPLASTY HIP (Left)  Recheck bilateral legs and low back Pt still c/o moderate pain to left hip and lumbar spine C/o mild tingling, (pins and needles feeling) to bilateral feet  Denies any new symptoms or issues Patient reports pain as moderate.  Objective:   VITALS:   Vitals:   07/31/19 1924 08/01/19 0331  BP: (!) 141/70 128/68  Pulse: 87 78  Resp: 14 16  Temp: 98.3 F (36.8 C) 98.2 F (36.8 C)  SpO2: 94% 93%    Left hip incision healing well nv intact distally Dressing intact Mild pain with log roll of bilateral lower legs No rashes or edema distally  LABS Recent Labs    07/29/19 0834 07/30/19 0416 07/31/19 0451  HGB 14.5 14.3 10.8*  HCT 43.0 42.4 32.0*  WBC 14.2* 15.2* 13.1*  PLT 269 250 218    Recent Labs    07/29/19 0834 07/30/19 0416 07/31/19 0451  NA 138 137 139  K 3.1* 3.1* 3.4*  BUN 15 16 21   CREATININE 1.06 1.01 1.21  GLUCOSE 104* 117* 126*     Assessment/Plan: 2 Days Post-Op Procedure(s) (LRB): LEFT HEMIARTHROPLASTY HIP (Left) S/p left hip hemi arthroplasty with continued pain Plan for IM nail of right femur early this week per Dr. Lyla Glassing Pain management Therapy as able Will continue to monitor his progress and lab results     Merla Riches PA-C, [place] [facility] now [facility]   [place] [address], [place], [place], [place] [address] Phone: [phone] [email or web address]   [employer]

## 2019-08-02 ENCOUNTER — Inpatient Hospital Stay (HOSPITAL_COMMUNITY): Payer: Medicare HMO | Admitting: Anesthesiology

## 2019-08-02 ENCOUNTER — Encounter (HOSPITAL_COMMUNITY)
Admission: EM | Disposition: A | Discharge status: Home Health | Payer: Self-pay | Source: Home / Self Care | Attending: Internal Medicine

## 2019-08-02 ENCOUNTER — Inpatient Hospital Stay (HOSPITAL_COMMUNITY): Payer: Medicare HMO

## 2019-08-02 ENCOUNTER — Encounter (HOSPITAL_COMMUNITY): Payer: Self-pay | Admitting: Internal Medicine

## 2019-08-02 HISTORY — PX: FEMUR IM NAIL: SHX1597

## 2019-08-02 LAB — PROTIME-INR
INR: 1.2 (ref 0.8–1.2)
Prothrombin Time: 14.3 seconds (ref 11.4–15.2)

## 2019-08-02 LAB — BASIC METABOLIC PANEL
Anion gap: 10 (ref 5–15)
BUN: 18 mg/dL (ref 8–23)
CO2: 26 mmol/L (ref 22–32)
Calcium: 9.8 mg/dL (ref 8.9–10.3)
Chloride: 101 mmol/L (ref 98–111)
Creatinine, Ser: 1.06 mg/dL (ref 0.61–1.24)
GFR calc Af Amer: >60 mL/min (ref >60)
GFR calc non Af Amer: >60 mL/min (ref >60)
Glucose, Bld: 104 mg/dL — ABNORMAL HIGH (ref 70–99)
Potassium: 3.7 mmol/L (ref 3.5–5.1)
Sodium: 137 mmol/L (ref 135–145)

## 2019-08-02 LAB — CBC
HCT: 27.3 % — ABNORMAL LOW (ref 39.0–52.0)
Hemoglobin: 9 g/dL — ABNORMAL LOW (ref 13.0–17.0)
MCH: 30.5 pg (ref 26.0–34.0)
MCHC: 33 g/dL (ref 30.0–36.0)
MCV: 92.5 fL (ref 80.0–100.0)
Platelets: 244 10*3/uL (ref 150–400)
RBC: 2.95 MIL/uL — ABNORMAL LOW (ref 4.22–5.81)
RDW: 13.6 % (ref 11.5–15.5)
WBC: 15.5 10*3/uL — ABNORMAL HIGH (ref 4.0–10.5)
nRBC: 0 % (ref 0.0–0.2)

## 2019-08-02 LAB — MAGNESIUM: Magnesium: 1.9 mg/dL (ref 1.7–2.4)

## 2019-08-02 SURGERY — INSERTION, INTRAMEDULLARY ROD, FEMUR
Anesthesia: General | Site: Leg Upper | Laterality: Right

## 2019-08-02 MED ORDER — PROPOFOL 10 MG/ML IV BOLUS
INTRAVENOUS | Status: DC | PRN
  Administered 2019-08-02: 130 mg via INTRAVENOUS

## 2019-08-02 MED ORDER — PHENOL 1.4 % MT LIQD: 1.0000 | OROMUCOSAL | Status: DC | PRN

## 2019-08-02 MED ORDER — ROCURONIUM BROMIDE 10 MG/ML (PF) SYRINGE
PREFILLED_SYRINGE | INTRAVENOUS | Status: AC
  Filled 2019-08-02: qty 20

## 2019-08-02 MED ORDER — 0.9 % SODIUM CHLORIDE (POUR BTL) OPTIME
TOPICAL | Status: DC | PRN
Start: 2019-08-02 — End: 2019-08-02
  Administered 2019-08-02: 1000 mL

## 2019-08-02 MED ORDER — FENTANYL CITRATE (PF) 250 MCG/5ML IJ SOLN
INTRAMUSCULAR | Status: AC
  Filled 2019-08-02: qty 5

## 2019-08-02 MED ORDER — DEXAMETHASONE SODIUM PHOSPHATE 10 MG/ML IJ SOLN
INTRAMUSCULAR | Status: DC | PRN
  Administered 2019-08-02: 10 mg via INTRAVENOUS

## 2019-08-02 MED ORDER — SODIUM CHLORIDE (PF) 0.9 % IJ SOLN
INTRAMUSCULAR | Status: AC
  Filled 2019-08-02: qty 10

## 2019-08-02 MED ORDER — ONDANSETRON HCL 4 MG/2ML IJ SOLN
4.0000 mg | Freq: Once | INTRAMUSCULAR | Status: AC | PRN
  Administered 2019-08-02: 4 mg via INTRAVENOUS

## 2019-08-02 MED ORDER — SUGAMMADEX SODIUM 200 MG/2ML IV SOLN
INTRAVENOUS | Status: DC | PRN
Start: 2019-08-02 — End: 2019-08-02
  Administered 2019-08-02: 200 mg via INTRAVENOUS

## 2019-08-02 MED ORDER — ROCURONIUM BROMIDE 10 MG/ML (PF) SYRINGE
PREFILLED_SYRINGE | INTRAVENOUS | Status: DC | PRN
  Administered 2019-08-02: 60 mg via INTRAVENOUS

## 2019-08-02 MED ORDER — ALBUMIN HUMAN 5 % IV SOLN: INTRAVENOUS | Status: DC | PRN

## 2019-08-02 MED ORDER — ALBUMIN HUMAN 5 % IV SOLN
12.5000 g | Freq: Once | INTRAVENOUS | Status: DC
  Administered 2019-08-02: 12.5 g via INTRAVENOUS

## 2019-08-02 MED ORDER — FENTANYL CITRATE (PF) 100 MCG/2ML IJ SOLN
INTRAMUSCULAR | Status: AC
  Filled 2019-08-02: qty 2

## 2019-08-02 MED ORDER — ALBUMIN HUMAN 5 % IV SOLN
INTRAVENOUS | Status: AC
  Filled 2019-08-02: qty 250

## 2019-08-02 MED ORDER — CEFAZOLIN SODIUM-DEXTROSE 2-4 GM/100ML-% IV SOLN
2.0000 g | INTRAVENOUS | Status: AC
  Administered 2019-08-02: 2 g via INTRAVENOUS

## 2019-08-02 MED ORDER — PHENYLEPHRINE 40 MCG/ML (10ML) SYRINGE FOR IV PUSH (FOR BLOOD PRESSURE SUPPORT)
PREFILLED_SYRINGE | INTRAVENOUS | Status: AC
  Filled 2019-08-02: qty 10

## 2019-08-02 MED ORDER — ONDANSETRON HCL 4 MG/2ML IJ SOLN
INTRAMUSCULAR | Status: AC
  Filled 2019-08-02: qty 2

## 2019-08-02 MED ORDER — CEFAZOLIN SODIUM-DEXTROSE 2-4 GM/100ML-% IV SOLN
INTRAVENOUS | Status: AC
  Administered 2019-08-03: 2 g via INTRAVENOUS
  Filled 2019-08-02: qty 100

## 2019-08-02 MED ORDER — ENOXAPARIN SODIUM 40 MG/0.4ML ~~LOC~~ SOLN
40.0000 mg | SUBCUTANEOUS | Status: DC
  Administered 2019-08-03 – 2019-08-05 (×3): 40 mg via SUBCUTANEOUS
  Filled 2019-08-02 (×3): qty 0.4

## 2019-08-02 MED ORDER — CEFAZOLIN SODIUM-DEXTROSE 2-4 GM/100ML-% IV SOLN
2.0000 g | INTRAVENOUS | Status: DC
Start: 2019-08-03 — End: 2019-08-02

## 2019-08-02 MED ORDER — METOCLOPRAMIDE HCL 5 MG/ML IJ SOLN: 5.0000 mg | Freq: Three times a day (TID) | INTRAMUSCULAR | Status: DC | PRN

## 2019-08-02 MED ORDER — LACTATED RINGERS IV SOLN: INTRAVENOUS | Status: DC

## 2019-08-02 MED ORDER — OXYCODONE HCL 5 MG/5ML PO SOLN: 5.0000 mg | Freq: Once | ORAL | Status: DC | PRN

## 2019-08-02 MED ORDER — MIDAZOLAM HCL 2 MG/2ML IJ SOLN
INTRAMUSCULAR | Status: AC
  Filled 2019-08-02: qty 2

## 2019-08-02 MED ORDER — PROPOFOL 10 MG/ML IV BOLUS
INTRAVENOUS | Status: AC
  Filled 2019-08-02: qty 20

## 2019-08-02 MED ORDER — MENTHOL 3 MG MT LOZG: 1.0000 | LOZENGE | OROMUCOSAL | Status: DC | PRN

## 2019-08-02 MED ORDER — MEPERIDINE HCL 25 MG/ML IJ SOLN: 6.2500 mg | INTRAMUSCULAR | Status: DC | PRN

## 2019-08-02 MED ORDER — DOCUSATE SODIUM 100 MG PO CAPS
100.0000 mg | ORAL_CAPSULE | Freq: Two times a day (BID) | ORAL | Status: DC
  Administered 2019-08-02 – 2019-08-06 (×8): 100 mg via ORAL
  Filled 2019-08-02 (×8): qty 1

## 2019-08-02 MED ORDER — CHLORHEXIDINE GLUCONATE 0.12 % MT SOLN
15.0000 mL | Freq: Once | OROMUCOSAL | Status: AC
  Filled 2019-08-02: qty 15

## 2019-08-02 MED ORDER — MIDAZOLAM HCL 2 MG/2ML IJ SOLN
0.5000 mg | INTRAMUSCULAR | Status: DC | PRN
  Administered 2019-08-02: 0.5 mg via INTRAVENOUS

## 2019-08-02 MED ORDER — ONDANSETRON HCL 4 MG PO TABS: 4.0000 mg | ORAL_TABLET | Freq: Four times a day (QID) | ORAL | Status: DC | PRN

## 2019-08-02 MED ORDER — DEXMEDETOMIDINE HCL IN NACL 200 MCG/50ML IV SOLN
INTRAVENOUS | Status: DC | PRN
Start: 2019-08-02 — End: 2019-08-02
  Administered 2019-08-02: 8 ug via INTRAVENOUS
  Administered 2019-08-02: 20 ug via INTRAVENOUS
  Administered 2019-08-02: 12 ug via INTRAVENOUS

## 2019-08-02 MED ORDER — OXYCODONE HCL 5 MG PO TABS: 5.0000 mg | ORAL_TABLET | Freq: Once | ORAL | Status: DC | PRN

## 2019-08-02 MED ORDER — PHENYLEPHRINE 40 MCG/ML (10ML) SYRINGE FOR IV PUSH (FOR BLOOD PRESSURE SUPPORT)
PREFILLED_SYRINGE | INTRAVENOUS | Status: DC | PRN
  Administered 2019-08-02: 80 ug via INTRAVENOUS
  Administered 2019-08-02: 120 ug via INTRAVENOUS

## 2019-08-02 MED ORDER — FENTANYL CITRATE (PF) 100 MCG/2ML IJ SOLN
25.0000 ug | INTRAMUSCULAR | Status: DC | PRN
  Administered 2019-08-02: 50 ug via INTRAVENOUS

## 2019-08-02 MED ORDER — ACETAMINOPHEN 325 MG PO TABS: 325.0000 mg | ORAL_TABLET | ORAL | Status: DC | PRN

## 2019-08-02 MED ORDER — METOCLOPRAMIDE HCL 5 MG PO TABS: 5.0000 mg | ORAL_TABLET | Freq: Three times a day (TID) | ORAL | Status: DC | PRN

## 2019-08-02 MED ORDER — LIDOCAINE 2% (20 MG/ML) 5 ML SYRINGE
INTRAMUSCULAR | Status: DC | PRN
  Administered 2019-08-02: 100 mg via INTRAVENOUS

## 2019-08-02 MED ORDER — ACETAMINOPHEN 160 MG/5ML PO SOLN: 325.0000 mg | ORAL | Status: DC | PRN

## 2019-08-02 MED ORDER — ONDANSETRON HCL 4 MG/2ML IJ SOLN: 4.0000 mg | Freq: Four times a day (QID) | INTRAMUSCULAR | Status: DC | PRN

## 2019-08-02 MED ORDER — EPHEDRINE SULFATE 50 MG/ML IJ SOLN
10.0000 mg | Freq: Once | INTRAMUSCULAR | Status: DC
  Administered 2019-08-02: 10 mg via INTRAVENOUS

## 2019-08-02 MED ORDER — EPHEDRINE 5 MG/ML INJ
INTRAVENOUS | Status: AC
  Filled 2019-08-02: qty 10

## 2019-08-02 MED ORDER — LIDOCAINE 2% (20 MG/ML) 5 ML SYRINGE
INTRAMUSCULAR | Status: AC
  Filled 2019-08-02: qty 5

## 2019-08-02 MED ORDER — DEXAMETHASONE SODIUM PHOSPHATE 10 MG/ML IJ SOLN
INTRAMUSCULAR | Status: AC
  Filled 2019-08-02: qty 1

## 2019-08-02 MED ORDER — CHLORHEXIDINE GLUCONATE 0.12 % MT SOLN
OROMUCOSAL | Status: AC
  Administered 2019-08-02: 15 mL via OROMUCOSAL
  Filled 2019-08-02: qty 15

## 2019-08-02 MED ORDER — CEFAZOLIN SODIUM-DEXTROSE 2-4 GM/100ML-% IV SOLN
2.0000 g | Freq: Four times a day (QID) | INTRAVENOUS | Status: AC
  Administered 2019-08-02: 2 g via INTRAVENOUS
  Filled 2019-08-02 (×2): qty 100

## 2019-08-02 MED ORDER — DEXMEDETOMIDINE HCL IN NACL 200 MCG/50ML IV SOLN
INTRAVENOUS | Status: AC
  Filled 2019-08-02: qty 50

## 2019-08-02 MED ORDER — DEXMEDETOMIDINE HCL IN NACL 80 MCG/20ML IV SOLN
INTRAVENOUS | Status: AC
  Administered 2019-08-02: 80 ug
  Filled 2019-08-02: qty 20

## 2019-08-02 MED ORDER — POVIDONE-IODINE 10 % EX SWAB: 2.0000 "application " | Freq: Once | CUTANEOUS | Status: DC

## 2019-08-02 MED ORDER — DEXMEDETOMIDINE HCL 200 MCG/2ML IV SOLN
80.0000 ug | Freq: Once | INTRAVENOUS | Status: DC
  Filled 2019-08-02: qty 0.8

## 2019-08-02 MED ORDER — FENTANYL CITRATE (PF) 100 MCG/2ML IJ SOLN
INTRAMUSCULAR | Status: DC | PRN
  Administered 2019-08-02 (×3): 50 ug via INTRAVENOUS

## 2019-08-02 MED ORDER — PHENYLEPHRINE HCL-NACL 10-0.9 MG/250ML-% IV SOLN
INTRAVENOUS | Status: DC | PRN
Start: 2019-08-02 — End: 2019-08-02
  Administered 2019-08-02: 40 ug/min via INTRAVENOUS

## 2019-08-02 SURGICAL SUPPLY — 54 items
ADH SKN CLS APL DERMABOND .7 (GAUZE/BANDAGES/DRESSINGS) ×2
ALCOHOL 70% 16 OZ (MISCELLANEOUS) ×3 IMPLANT
APL PRP STRL LF DISP 70% ISPRP (MISCELLANEOUS) ×1
BIT DRILL AO GAMMA 4.2X180 (BIT) ×2 IMPLANT
BLADE SURG 15 STRL LF DISP TIS (BLADE) IMPLANT
BLADE SURG 15 STRL SS (BLADE) ×3
BNDG COHESIVE 4X5 TAN STRL (GAUZE/BANDAGES/DRESSINGS) ×3 IMPLANT
CHLORAPREP W/TINT 26 (MISCELLANEOUS) ×3 IMPLANT
COVER PERINEAL POST (MISCELLANEOUS) ×3 IMPLANT
COVER SURGICAL LIGHT HANDLE (MISCELLANEOUS) ×3 IMPLANT
COVER WAND RF STERILE (DRAPES) ×3 IMPLANT
DERMABOND ADVANCED (GAUZE/BANDAGES/DRESSINGS) ×4
DERMABOND ADVANCED .7 DNX12 (GAUZE/BANDAGES/DRESSINGS) ×2 IMPLANT
DRAPE C-ARM 42X72 X-RAY (DRAPES) ×3 IMPLANT
DRAPE C-ARMOR (DRAPES) ×3 IMPLANT
DRAPE IMP U-DRAPE 54X76 (DRAPES) ×6 IMPLANT
DRAPE STERI IOBAN 125X83 (DRAPES) ×3 IMPLANT
DRAPE U-SHAPE 47X51 STRL (DRAPES) ×6 IMPLANT
DRSG AQUACEL AG ADV 3.5X10 (GAUZE/BANDAGES/DRESSINGS) ×2 IMPLANT
DRSG MEPILEX BORDER 4X4 (GAUZE/BANDAGES/DRESSINGS) ×7 IMPLANT
DRSG MEPILEX BORDER 4X8 (GAUZE/BANDAGES/DRESSINGS) ×2 IMPLANT
ELECT REM PT RETURN 9FT ADLT (ELECTROSURGICAL) ×3
ELECTRODE REM PT RTRN 9FT ADLT (ELECTROSURGICAL) ×1 IMPLANT
FACESHIELD WRAPAROUND (MASK) ×3 IMPLANT
FACESHIELD WRAPAROUND OR TEAM (MASK) ×1 IMPLANT
GLOVE BIO SURGEON STRL SZ8.5 (GLOVE) ×6 IMPLANT
GLOVE BIOGEL PI IND STRL 8.5 (GLOVE) ×1 IMPLANT
GLOVE BIOGEL PI INDICATOR 8.5 (GLOVE) ×2
GOWN STRL REUS W/ TWL LRG LVL3 (GOWN DISPOSABLE) ×1 IMPLANT
GOWN STRL REUS W/TWL 2XL LVL3 (GOWN DISPOSABLE) ×3 IMPLANT
GOWN STRL REUS W/TWL LRG LVL3 (GOWN DISPOSABLE) ×3
GUIDEROD T2 3X1000 (ROD) ×2 IMPLANT
K-WIRE  3.2X450M STR (WIRE) ×3
K-WIRE 3.2X450M STR (WIRE) ×1
KIT NAIL LONG RIGHT TI (Nail) ×2 IMPLANT
KIT TURNOVER KIT B (KITS) ×3 IMPLANT
KWIRE 3.2X450M STR (WIRE) IMPLANT
MANIFOLD NEPTUNE II (INSTRUMENTS) ×3 IMPLANT
MARKER SKIN DUAL TIP RULER LAB (MISCELLANEOUS) ×3 IMPLANT
NS IRRIG 1000ML POUR BTL (IV SOLUTION) ×3 IMPLANT
PACK GENERAL/GYN (CUSTOM PROCEDURE TRAY) ×3 IMPLANT
PACK UNIVERSAL I (CUSTOM PROCEDURE TRAY) ×3 IMPLANT
PAD ARMBOARD 7.5X6 YLW CONV (MISCELLANEOUS) ×6 IMPLANT
REAMER SHAFT BIXCUT (INSTRUMENTS) ×2 IMPLANT
SCREW LAG GAMMA 3 TI 10.5X105M (Screw) ×2 IMPLANT
SCREW LOCKING THREADED 5X47.5 (Screw) ×2 IMPLANT
SUT MNCRL AB 3-0 PS2 18 (SUTURE) ×3 IMPLANT
SUT MNCRL AB 3-0 PS2 27 (SUTURE) ×3 IMPLANT
SUT MON AB 2-0 CT1 27 (SUTURE) ×3 IMPLANT
SUT MON AB 2-0 CT1 36 (SUTURE) ×3 IMPLANT
SUT VIC AB 1 CT1 27 (SUTURE) ×3
SUT VIC AB 1 CT1 27XBRD ANBCTR (SUTURE) ×1 IMPLANT
TOWEL GREEN STERILE (TOWEL DISPOSABLE) ×3 IMPLANT
TOWEL GREEN STERILE FF (TOWEL DISPOSABLE) ×3 IMPLANT

## 2019-08-02 NOTE — Plan of Care (Signed)

## 2019-08-02 NOTE — Anesthesia Procedure Notes (Signed)
Procedure Name: Intubation Date/Time: 08/02/2019 1:03 PM Performed by: Kyung Rudd, CRNA Pre-anesthesia Checklist: Patient identified, Emergency Drugs available, Suction available and Patient being monitored Patient Re-evaluated:Patient Re-evaluated prior to induction Oxygen Delivery Method: Circle system utilized Preoxygenation: Pre-oxygenation with 100% oxygen Induction Type: IV induction Ventilation: Mask ventilation without difficulty and Oral airway inserted - appropriate to patient size Laryngoscope Size: Glidescope and 4 Tube type: Oral Tube size: 7.5 mm Number of attempts: 1 Airway Equipment and Method: Stylet and Video-laryngoscopy Placement Confirmation: ETT inserted through vocal cords under direct vision,  positive ETCO2 and breath sounds checked- equal and bilateral Secured at: 22 cm Tube secured with: Tape Dental Injury: Teeth and Oropharynx as per pre-operative assessment

## 2019-08-02 NOTE — Anesthesia Preprocedure Evaluation (Addendum)
Anesthesia Evaluation  Patient identified by MRN, date of birth, ID band Patient awake    Reviewed: Allergy & Precautions, NPO status , Patient's Chart, lab work & pertinent test results  History of Anesthesia Complications (+) history of anesthetic complications  Airway Mallampati: III  TM Distance: <3 FB Neck ROM: Limited    Dental  (+) Teeth Intact, Dental Advisory Given,    Pulmonary COPD, Current Smoker,    breath sounds clear to auscultation       Cardiovascular hypertension, Pt. on medications and Pt. on home beta blockers + CAD and +CHF  + dysrhythmias Atrial Fibrillation  Rhythm:Regular Rate:Normal     Neuro/Psych negative neurological ROS  negative psych ROS   GI/Hepatic Neg liver ROS, GERD  Medicated,  Endo/Other  negative endocrine ROS  Renal/GU Renal disease     Musculoskeletal negative musculoskeletal ROS (+)   Abdominal Normal abdominal exam  (+)   Peds  Hematology negative hematology ROS (+)   Anesthesia Other Findings   Reproductive/Obstetrics                            Anesthesia Physical  Anesthesia Plan  ASA: III  Anesthesia Plan: General   Post-op Pain Management:    Induction: Intravenous  PONV Risk Score and Plan: 2 and Ondansetron, Dexamethasone and Treatment may vary due to age or medical condition  Airway Management Planned: LMA  Additional Equipment: None  Intra-op Plan:   Post-operative Plan: Extubation in OR  Informed Consent: I have reviewed the patients History and Physical, chart, labs and discussed the procedure including the risks, benefits and alternatives for the proposed anesthesia with the patient or authorized representative who has indicated his/her understanding and acceptance.     Dental advisory given  Plan Discussed with: CRNA and Anesthesiologist  Anesthesia Plan Comments: (PT was agitated at emergence of 07/30/19,  Will  dose with dexametomidate  prior to emergence   Echo:  1. Left ventricular ejection fraction, by estimation, is 50 to 55%. The  left ventricle has low normal function. The left ventricle has no regional  wall motion abnormalities. There is mild asymmetric left ventricular  hypertrophy of the basal-septal  segment. Left ventricular diastolic parameters are consistent with Grade I  diastolic dysfunction (impaired relaxation).  2. Right ventricular systolic function is mildly reduced. The right  ventricular size is mildly enlarged. Tricuspid regurgitation signal is  inadequate for assessing PA pressure.  3. Right atrial size was mildly dilated.  4. The mitral valve is grossly normal. Mild mitral valve regurgitation.  No evidence of mitral stenosis.  5. The aortic valve is tricuspid. Aortic valve regurgitation is mild.  Mild aortic valve sclerosis is present, with no evidence of aortic valve  stenosis.  6. Aortic dilatation noted. There is mild dilatation of the transverse  aorta measuring 33 mm.  7. The inferior vena cava is normal in size with <50% respiratory  variability, suggesting right atrial pressure of 8 mmHg. )       Anesthesia Quick Evaluation

## 2019-08-02 NOTE — Anesthesia Postprocedure Evaluation (Signed)
Anesthesia Post Note  Patient: [name]  Procedure(s) Performed: LEFT HEMIARTHROPLASTY HIP (Left Hip)     Patient location during evaluation: PACU Anesthesia Type: General Level of consciousness: awake, confused and combative Pain management: pain level controlled Vital Signs Assessment: post-procedure vital signs reviewed and stable Respiratory status: spontaneous breathing, nonlabored ventilation and respiratory function stable Cardiovascular status: blood pressure returned to baseline and stable Postop Assessment: no apparent nausea or vomiting Anesthetic complications: no Comments: Confused and combative in PACU requiring precedex. Back to baseline following morning per notes.   No complications documented.  Last Vitals:  Vitals:   08/02/19 0242 08/02/19 0806  BP: (!) 152/72 (!) 153/75  Pulse: 75 82  Resp: 14 15  Temp: 36.4 C 36.7 C  SpO2: 94% 96%    Last Pain:  Vitals:   08/02/19 0806  TempSrc: Oral  PainSc:                  Greg Hughes

## 2019-08-02 NOTE — Op Note (Addendum)
OPERATIVE REPORT  SURGEON: Rod Can, MD   ASSISTANT: Nada Libman, RNFA.  PREOPERATIVE DIAGNOSIS: Impending pathologic Right intertrochanteric femur fracture.   POSTOPERATIVE DIAGNOSIS: Impending pathologic Right intertrochanteric femur fracture.    PROCEDURE: Prophylactic intramedullary fixation, Right femur.  Interpretation of fluoroscopic images.   IMPLANTS: Stryker Gamma3 Hip Fracture Nail, 11 by 400 mm, 125 degrees. 10.5 x 105 mm Hip Fracture Nail Lag Screw. 5 x 47.5 mm distal interlocking screw 1.  ANESTHESIA:  General  ESTIMATED BLOOD LOSS:-250 mL    ANTIBIOTICS: 2 g Ancef.  DRAINS: None.  COMPLICATIONS: None.   CONDITION: PACU - hemodynamically stable.  BRIEF CLINICAL NOTE: Greg Hughes is a 79 y.o. male who presented with multiple metastatic osseous lesions. Last week, he underwent cemented left hip hemiarthroplasty for pathologic proximal femur fracture. He returns today for planned prophylactic nailing of the right femure.The risks, benefits, and alternatives to the procedure were explained, and the patient elected to proceed.  PROCEDURE IN DETAIL: Surgical site was marked by myself. The patient was taken to the operating room and anesthesia was induced on the bed. The patient was then transferred to the [facility] table and the nonoperative lower extremity was scissored underneath the operative side.The hip was prepped and draped in the normal sterile surgical fashion. Timeout was called verifying side and site of surgery. Preop antibiotics were given with 60 minutes of beginning the procedure.  Fluoroscopy was used to define the patient's anatomy. A 4 cm incision was made just proximal to the tip of the greater trochanter. The awl was used to obtain the standard starting point for a trochanteric entry nail under fluoroscopic control. The guidepin was placed. The entry reamer was used to open the proximal femur.  I placed the guidewire to the level of the physeal  scar of the knee. I measured the length of the guidewire. Sequential reaming was performed up to a size 12.5 mm with excellent chatter. Therefore, a size 11 by 400 mm nail was selected and assembled to the jig on the back table. The nail was placed without any difficulty. Through a separate stab incision, the cannula was placed down to the bone in preparation for the cephalomedullary device. A guidepin was placed into the femoral head using AP and lateral fluoroscopy views. The pin was measured, and then reaming was performed to the appropriate depth. The lag screw was inserted to the appropriate depth. The setscrew was tightened. Using perfect circle technique, a distal interlocking screw was placed. The jig was removed. Final AP and lateral fluoroscopy views were obtained to confirm fracture reduction and hardware placement. Tip apex distance was appropriate. There was no chondral penetration.  The wounds were copiously irrigated with saline. The wound was closed in layers with #1 Vicryl for the fascia, 2-0 Monocryl for the deep dermal layer, and 3-0 Monocryl subcuticular stitch. Glue was applied to the skin. Once the glue was fully hardened, sterile dressing was applied. The patient was then awakened from anesthesia and taken to the PACU in stable condition. Sponge needle and instrument counts were correct at the end of the case 2. There were no known complications.  We will readmit the patient to the hospitalist. Weightbearing status will be weightbearing as tolerated with a walker. We will begin Lovenox for DVT prophylaxis. After 48 - 72 hours, xarelto can be resumed. The patient will work with physical therapy and undergo disposition planning.

## 2019-08-02 NOTE — Interval H&P Note (Signed)
History and Physical Interval Note:  08/02/2019 12:21 PM  Greg Hughes  has presented today for surgery, with the diagnosis of Impending pathological fracture, right femur.  The various methods of treatment have been discussed with the patient and family. After consideration of risks, benefits and other options for treatment, the patient has consented to  Procedure(s): INTRAMEDULLARY (IM) NAIL FEMORAL (Right) as a surgical intervention.  The patient's history has been reviewed, patient examined, no change in status, stable for surgery.  I have reviewed the patient's chart and labs.  Questions were answered to the patient's satisfaction.     Hilton Cork Emilyrose Darrah

## 2019-08-02 NOTE — Plan of Care (Signed)
  Problem: Health Behavior/Discharge Planning: Goal: Ability to manage health-related needs will improve Outcome: Progressing   Problem: Clinical Measurements: Goal: Ability to maintain clinical measurements within normal limits will improve Outcome: Progressing Goal: Will remain free from infection Outcome: Progressing Goal: Diagnostic test results will improve Outcome: Progressing Goal: Respiratory complications will improve Outcome: Progressing Goal: Cardiovascular complication will be avoided Outcome: Progressing   Problem: Coping: Goal: Level of anxiety will decrease Outcome: Progressing   Problem: Safety: Goal: Ability to remain free from injury will improve Outcome: Progressing   
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
## 2019-08-02 NOTE — TOC Progression Note (Addendum)
Transition of Care [facility]) - Progression Note    Patient Details  Name: Greg Hughes MRN: [id number] Date of Birth: December 15, 1940  [facility]) CM/SW Contact  Sharin Mons, RN Phone Number: [phone] 08/02/2019, 7:30 PM  Clinical Narrative:    - s/p L hip hemiarthroplasty, 6/11 - s/p intramedullary fixation, Right femur, 6/14 PT to re-evaluate ...  TOC monitoring ...  will f/u with needs home with [facility] services vs SNF placemnt  Expected Discharge Plan: [place] (vs  SNF) Barriers to Discharge: Continued Medical Work up  Expected Discharge Plan and Services Expected Discharge Plan: [place] (vs  SNF) In-house Referral: Clinical Social Work Discharge Planning Services: CM Consult Post Acute Care Choice: Home Health, Durable Medical Equipment (vs. SNF) Living arrangements for the past 2 months: Single Family Home                                       Social Determinants of Health (SDOH) Interventions    Readmission Risk Interventions Readmission Risk Prevention Plan 07/31/2019  Transportation Screening Complete  PCP or Specialist Appt within 5-7 Days Not Complete  Not Complete comments disposition pending  Home Care Screening Complete  Medication Review (RN CM) Referral to Pharmacy  Some recent data might be hidden

## 2019-08-02 NOTE — Anesthesia Postprocedure Evaluation (Signed)
Anesthesia Post Note  Patient: MARTELL MCFADYEN  Procedure(s) Performed: INTRAMEDULLARY (IM) NAIL FEMORAL (Right Leg Upper)     Patient location during evaluation: PACU Anesthesia Type: General Level of consciousness: awake and alert Pain management: pain level controlled Vital Signs Assessment: post-procedure vital signs reviewed and stable Respiratory status: spontaneous breathing, nonlabored ventilation, respiratory function stable and patient connected to nasal cannula oxygen Cardiovascular status: blood pressure returned to baseline and stable Postop Assessment: no apparent nausea or vomiting Anesthetic complications: no   No complications documented.  Last Vitals:  Vitals:   08/02/19 1922 08/02/19 1924  BP:    Pulse: 90   Resp:    Temp:    SpO2: 100% 98%    Last Pain:  Vitals:   08/02/19 1924  TempSrc:   PainSc: 2                  Kawthar Ennen

## 2019-08-02 NOTE — Plan of Care (Signed)
  Problem: Activity: Goal: Risk for activity intolerance will decrease Outcome: Progressing   Problem: Coping: Goal: Level of anxiety will decrease Outcome: Progressing   Problem: Pain Managment: Goal: General experience of comfort will improve Outcome: Progressing   Problem: Safety: Goal: Ability to remain free from injury will improve Outcome: Progressing -Patient remains on telesitter monitoring. Patient constantly tries to get out of the bed.   Problem: Skin Integrity: Goal: Risk for impaired skin integrity will decrease Outcome: Progressing

## 2019-08-02 NOTE — Transfer of Care (Signed)
Immediate Anesthesia Transfer of Care Note  Patient: Greg Hughes  Procedure(s) Performed: INTRAMEDULLARY (IM) NAIL FEMORAL (Right Leg Upper)  Patient Location: PACU  Anesthesia Type:General  Level of Consciousness: awake, alert  and oriented  Airway & Oxygen Therapy: Patient Spontanous Breathing  Post-op Assessment: Report given to RN, Post -op Vital signs reviewed and stable and Patient moving all extremities  Post vital signs: Reviewed and stable  Last Vitals:  Vitals Value Taken Time  BP 103/43 08/02/19 1449  Temp    Pulse 73 08/02/19 1452  Resp 23 08/02/19 1452  SpO2 94 % 08/02/19 1452  Vitals shown include unvalidated device data.  Last Pain:  Vitals:   08/02/19 1107  TempSrc:   PainSc: 8       Patients Stated Pain Goal: 3 (27/12/92 9090)  Complications: No complications documented.

## 2019-08-03 ENCOUNTER — Encounter (HOSPITAL_COMMUNITY): Payer: Self-pay | Admitting: Orthopedic Surgery

## 2019-08-03 LAB — BASIC METABOLIC PANEL
Anion gap: 11 (ref 5–15)
BUN: 21 mg/dL (ref 8–23)
CO2: 24 mmol/L (ref 22–32)
Calcium: 9.9 mg/dL (ref 8.9–10.3)
Chloride: 104 mmol/L (ref 98–111)
Creatinine, Ser: 1.01 mg/dL (ref 0.61–1.24)
GFR calc Af Amer: >60 mL/min (ref >60)
GFR calc non Af Amer: >60 mL/min (ref >60)
Glucose, Bld: 145 mg/dL — ABNORMAL HIGH (ref 70–99)
Potassium: 4.1 mmol/L (ref 3.5–5.1)
Sodium: 139 mmol/L (ref 135–145)

## 2019-08-03 LAB — CBC
HCT: 20.9 % — ABNORMAL LOW (ref 39.0–52.0)
Hemoglobin: 7 g/dL — ABNORMAL LOW (ref 13.0–17.0)
MCH: 30.8 pg (ref 26.0–34.0)
MCHC: 33.5 g/dL (ref 30.0–36.0)
MCV: 92.1 fL (ref 80.0–100.0)
Platelets: 237 10*3/uL (ref 150–400)
RBC: 2.27 MIL/uL — ABNORMAL LOW (ref 4.22–5.81)
RDW: 13.5 % (ref 11.5–15.5)
WBC: 12.3 10*3/uL — ABNORMAL HIGH (ref 4.0–10.5)
nRBC: 0.2 % (ref 0.0–0.2)

## 2019-08-03 NOTE — Evaluation (Signed)
Physical Therapy Re Evaluation Patient Details Name: Greg Hughes MRN: [id number] DOB: [birth year] Today's Date: 08/03/2019   History of Present Illness  Patient is a 79 year old male S/P left hemi arthroplasty follwing a pathological fracture on the left. 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 He underwent prophylactic R IM nail for this on 08/02/19.  He also has Metastatic disease of the spine. PMH: [place]bb[place] abuse, right arm weakness, hernia, cataracts, HTN  Clinical Impression  Pt admitted with above diagnosis. Pt cognition somewhat improved from yesterday but continues to process slowly and demo STM deficits. Mod A to pivot from bed to chair. Pt tolerated well but still at a level that would not be safe for his wife at home. Continue to rec SNF for rehab.  Pt currently with functional limitations due to the deficits listed below (see PT Problem List). Pt will benefit from skilled PT to increase their independence and safety with mobility to allow discharge to the venue listed below.       Follow Up Recommendations SNF;Supervision/Assistance - 24 hour    Equipment Recommendations  None recommended by PT    Recommendations for Other Services OT consult     Precautions / Restrictions Precautions Precautions: Posterior Hip;Fall Precaution Booklet Issued: Yes (comment) Precaution Comments: gave pt handout for L hip and reviewed with he and wife Restrictions Weight Bearing Restrictions: Yes RLE Weight Bearing: Weight bearing as tolerated LLE Weight Bearing: Weight bearing as tolerated      Mobility  Bed Mobility Overal bed mobility: Needs Assistance Bed Mobility: Rolling;Sidelying to Sit Rolling: Max assist Sidelying to sit: Max assist       General bed mobility comments: rolled both directions, max A needed due to pain, pt able to hold self in SL once there. Max A for LE's off bed and elevation of trunk into sitting. Pt able to  scoot fwd to EOB with increased time  Transfers Overall transfer level: Needs assistance Equipment used: Rolling walker (2 wheeled);None Transfers: Sit to/from [facility] Sit to Stand: Mod assist Stand pivot transfers: Mod assist       General transfer comment: pt stood to RW with mod A for power up. Maintained standing x30 secs, full body shaking throughout the time. No AD used to pivot to chair, pt stood to therapist with mod A, small steps to pivot to chair. Pt's wife guided L hand to far arm of chair.    Ambulation/Gait             General Gait Details: unable  Stairs            Wheelchair Mobility    Modified Rankin (Stroke Patients Only)       Balance Overall balance assessment: Needs assistance Sitting-balance support: Feet supported;Bilateral upper extremity supported Sitting balance-Leahy Scale: Fair Sitting balance - Comments: posterior lean but no LOB with vc's Postural control: Posterior lean Standing balance support: Bilateral upper extremity supported;During functional activity Standing balance-Leahy Scale: Poor Standing balance comment: UE support and external assist needed                             Pertinent Vitals/Pain Pain Assessment: Faces Faces Pain Scale: Hurts whole lot Pain Location: R and L hip Pain Descriptors / Indicators: Aching;Grimacing;Guarding;Sharp;Shooting Pain Intervention(s): Limited activity within patient's tolerance;Monitored during session    [place] expects to be discharged to:: Private residence Living Arrangements:  Spouse/significant other Available Help at Discharge: Family Type of Home: House Home Access: Stairs to enter Entrance Stairs-Rails: Can reach both Entrance Stairs-Number of Steps: 1-2 Home Layout: One level   Additional Comments: wife would like to take the patient home     Prior Function Level of Independence: Independent         Comments:  was doing daily activity like mowing the lawn prior to the fracture      Hand Dominance   Dominant Hand: Right    Extremity/Trunk Assessment   Upper Extremity Assessment Upper Extremity Assessment: Generalized weakness    Lower Extremity Assessment Lower Extremity Assessment: RLE deficits/detail;LLE deficits/detail RLE Deficits / Details: hip flex 2-/5, knee ext 2/5, ankle WFL RLE: Unable to fully assess due to pain RLE Sensation: WNL RLE Coordination: decreased gross motor LLE Deficits / Details: hip flex 2/5, knee ext 2/5, ankle WFL LLE: Unable to fully assess due to pain LLE Sensation: WNL LLE Coordination: decreased gross motor    Cervical / Trunk Assessment Cervical / Trunk Assessment: Kyphotic  Communication   Communication: No difficulties  Cognition Arousal/Alertness: Awake/alert Behavior During Therapy: WFL for tasks assessed/performed Overall Cognitive Status: Impaired/Different from baseline Area of Impairment: Orientation;Memory;Problem solving                 Orientation Level: Disoriented to;Time   Memory: Decreased short-term memory       Problem Solving: Slow processing;Requires verbal cues General Comments: confusion has improved but pt with STM deficits as well as need for slow instructions and increased time to follow      General Comments General comments (skin integrity, edema, etc.): pt's wife present and supportive    Exercises General Exercises - Lower Extremity Ankle Circles/Pumps: AROM;Both;20 reps;Supine Quad Sets: AROM;Both;10 reps;Seated Gluteal Sets: AROM;Both;10 reps;Seated   Assessment/Plan    PT Assessment Patient needs continued PT services  PT Problem List Decreased strength;Decreased range of motion;Decreased activity tolerance;Decreased mobility;Decreased balance;Decreased knowledge of use of DME;Decreased knowledge of precautions;Pain       PT Treatment Interventions DME instruction;Gait training;Stair  training;Functional mobility training;Therapeutic activities;Therapeutic exercise    PT Goals (Current goals can be found in the Care Plan section)  Acute Rehab PT Goals Patient Stated Goal: to go home  PT Goal Formulation: With patient Time For Goal Achievement: 08/17/19 Potential to Achieve Goals: Fair    Frequency Min 3X/week   Barriers to discharge Decreased caregiver support wife similar age    Co-evaluation               AM-PAC PT "6 Clicks" Mobility  Outcome Measure Help needed turning from your back to your side while in a flat bed without using bedrails?: A Lot Help needed moving from lying on your back to sitting on the side of a flat bed without using bedrails?: A Lot Help needed moving to and from a bed to a chair (including a wheelchair)?: A Lot Help needed standing up from a chair using your arms (e.g., wheelchair or bedside chair)?: A Lot Help needed to walk in hospital room?: Total Help needed climbing 3-5 steps with a railing? : Total 6 Click Score: 10    End of Session Equipment Utilized During Treatment: Gait belt Activity Tolerance: No increased pain Patient left: in chair;with call bell/phone within reach;with family/visitor present (no chair alarm box in room, sec looking for one, pad under p) Nurse Communication: Mobility status PT Visit Diagnosis: Unsteadiness on feet (R26.81);Pain;Difficulty in walking, not elsewhere classified (R26.2);Muscle weakness (  generalized) (M62.81) Pain - Right/Left:  (both) Pain - part of body: Hip    Time: 7354-3014 PT Time Calculation (min) (ACUTE ONLY): 33 min   Charges:   PT Evaluation $PT Re-evaluation: 1 Re-eval PT Treatments $Therapeutic Activity: 8-22 mins        Leighton Roach, [place]  Pager [phone] Office [place] 08/03/2019, 12:15 PM

## 2019-08-03 NOTE — Plan of Care (Signed)

## 2019-08-03 NOTE — Progress Notes (Signed)
    Subjective:  Patient reports pain as mild to moderate.  Denies N/V/CP/SOB. C/o back pain.  Objective:   VITALS:   Vitals:   08/02/19 1924 08/02/19 2256 08/03/19 0433 08/03/19 0804  BP:  117/63 124/72 122/69  Pulse:  75 69 68  Resp:  16 16 16   Temp:  98.1 F (36.7 C) (!) 97.5 F (36.4 C) 98.1 F (36.7 C)  TempSrc:  Oral Oral Oral  SpO2: 98%   95%  Weight:        NAD ABD soft Sensation intact distally Intact pulses distally Dorsiflexion/Plantar flexion intact Incision: dressing C/D/I   Lab Results  Component Value Date   WBC 12.3 (H) 08/03/2019   HGB 7.0 (L) 08/03/2019   HCT 20.9 (L) 08/03/2019   MCV 92.1 08/03/2019   PLT 237 08/03/2019   BMET    Component Value Date/Time   NA 139 08/03/2019 0209   NA 144 11/09/2011 0419   K 4.1 08/03/2019 0209   K 4.0 11/09/2011 0419   CL 104 08/03/2019 0209   CL 109 (H) 11/09/2011 0419   CO2 24 08/03/2019 0209   CO2 26 11/09/2011 0419   GLUCOSE 145 (H) 08/03/2019 0209   GLUCOSE 93 11/09/2011 0419   BUN 21 08/03/2019 0209   BUN 16 11/09/2011 0419   CREATININE 1.01 08/03/2019 0209   CREATININE 1.02 06/06/2016 0000   CREATININE 1.00 11/26/2011 1108   CALCIUM 9.9 08/03/2019 0209   CALCIUM 8.7 11/09/2011 0419   GFRNONAA >60 08/03/2019 0209   GFRNONAA >60 11/09/2011 0419   GFRNONAA >60 06/26/2010 0944   GFRAA >60 08/03/2019 0209   GFRAA >60 11/09/2011 0419   GFRAA >60 06/26/2010 0944     Assessment/Plan: 1 Day Post-Op   Principal Problem:   Intractable low back pain Active Problems:   Mixed hyperlipidemia   Essential hypertension   COPD (chronic obstructive pulmonary disease) (HCC)   AF (paroxysmal atrial fibrillation) (HCC)   Gout   Benign prostatic hyperplasia with lower urinary tract symptoms   Chronic diastolic CHF (congestive heart failure) (HCC)   Coronary artery disease involving native coronary artery of native heart   Pathological fracture in neoplastic disease, hip, unspecified, initial  encounter for fracture (HCC)    WBAT with walker L posterior hip precautions DVT ppx: can switch lovenox to xarelto when ok with hospitalist, SCDs, TEDS PO pain control PT/OT Dispo: follow surgical pathology, d/c planning, will need rad onc for XRT to B/L hips & L ilium    Hilton Cork Jeree Delcid 08/03/2019, 12:29 PM   Rod Can, MD [phone] [facility] is now [facility]  [place] [address], [place], [place] [address] Phone: [phone] [email or web address] Facebook  [employer]

## 2019-08-03 NOTE — Progress Notes (Signed)
PROGRESS NOTE  Greg Hughes GLO:[id number] DOB: [birth year] DOA: 07/28/2019 PCP: Tonia Ghent, MD   LOS: 5 days   Brief Narrative / Interim history: 79 year old male with CAD, BPH, OSA, paroxysmal A. fib on Xarelto, recent hospitalization at [facility] in January 2021 for a lung nodule status post resection (I am unable to find pathology reports), came to the hospital with severe low back pain, hip pain and inability to walk.  Several weeks ago while he was mowing the lawn felt like his hip was popping out of socket, followed by progressively worsening pain.  He was seen in the ED on midnight and x-ray was unremarkable.  Pain continued and came back to the hospital.  CT scan now concern for multiple metastatic osteolytic lesions and a pathological fracture to the left hip.  He is now status post left hip hemiarthroplasty as well as prophylactic IM nailing of the right femur  Subjective / 24h Interval events: No pain this morning, overall feels well.  He is not looking forward to working with physical therapy.  Denies any chest pain, denies any shortness of breath.  No abdominal pain, no nausea or vomiting.  Assessment & Plan: Principal Problem Left femoral neck pathological fracture-on admission patient underwent a CT of the left hip due to severe pain which showed a lytic lesion involving the left femoral neck with associated pathological fracture, also an additional lytic lesion in the left iliac bone and left sacrum, concerning for metastatic disease.  Orthopedic surgery consulted, appreciate input.  He is status post left hip hemiarthroplasty, posterior approach and open biopsy of the left femoral bone lesion on 6/11.  CT scan also showed extensive metastatic disease in the right femur and patient underwent a second surgery on 6/14 for prophylactic intramedullary fixation of the right femur.  PT evaluation pending, suspect he will need SNF  Active Problems Metastatic disease of unknown  primary-patient has a lung nodule status post resection in January 2021.  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  Imaging of the chest, abdomen, pelvis, femurs show extensive metastatic disease in the right femur, left femur, left iliac bone, left sacrum in multiple areas in the vertebral bodies as well as right scapula, left manubrium.   -Imaging also showed thickening of the esophagus near the GE junction which likely represent a small hernia rather than malignancy without any surrounding lymphadenopathy -There is a 6 mm pulmonary nodule on the right lung base, which was not there in 2008 but not clear whether CT imaging at [facility] had this nodule -There are also ill-defined lesions in the liver consistent with hepatic metastatic disease -Importantly, imaging did not reveal a candidate primary lesion confidently identified in the chest, abdomen or pelvis. -Results from biopsies are still pending this morning, will follow up in the afternoon  Essential hypertension-continue home medications, blood pressure acceptable today  COPD-no wheezing, respiratory status stable, he is on room air  Paroxysmal A. fib-continue home medications, his Xarelto has been on hold for surgery, patient has been placed back on Lovenox postop, per orthopedic surgery can resume Lovenox and 48-72 hours postop.  BPH-continue Flomax and Proscar  Chronic diastolic CHF -appears euvolemic, monitor I&O's  Coronary artery disease-continue statin, beta-blockers, chest pain-free  HLD - continue statin  Gout - continue allopurinol  Constipation-change MiraLAX to scheduled  Historically, patient is also status post C5-C7 ACDF and L3-L5 posterior spinal  and interbody  fusion and spinal cord simulator    Scheduled Meds: . allopurinol  300 mg Oral Daily  . Chlorhexidine Gluconate Cloth  6 each  Topical Daily  . diltiazem  180 mg Oral Daily  . docusate sodium  100 mg Oral BID  . enoxaparin (LOVENOX) injection  40 mg Subcutaneous Q24H  . finasteride  5 mg Oral Daily  . metoprolol succinate  50 mg Oral Daily  . polyethylene glycol  17 g Oral Daily  . pregabalin  25 mg Oral BID  . simvastatin  10 mg Oral q1800  . tamsulosin  0.8 mg Oral Daily   Continuous Infusions: . lactated ringers 10 mL/hr at 08/02/19 1132   PRN Meds:.acetaminophen **OR** acetaminophen, alum & mag hydroxide-simeth, HYDROmorphone (DILAUDID) injection **OR** HYDROmorphone (DILAUDID) injection, HYDROmorphone, menthol-cetylpyridinium **OR** phenol, metoCLOPramide **OR** metoCLOPramide (REGLAN) injection, ondansetron **OR** ondansetron (ZOFRAN) IV  DVT prophylaxis: SCDs Code Status: Full code Family Communication: No family present at bedside this morning  Status is: Inpatient  Remains inpatient appropriate because:Inpatient level of care appropriate due to severity of illness  Dispo: The patient is from: Home              Anticipated d/c is to: SNF              Anticipated d/c date is: 2 days              Patient currently is not medically stable to d/c.  Consultants:  Orthopedic surgery   Procedures:  Left hip hemiarthroplasty, posterior approach by Dr. Lyla Glassing 6/11 Prophylactic IM fixation, right femur 6/14  Microbiology  none  Antimicrobials: none, perioperative antibiotics   Objective: Vitals:   08/02/19 1924 08/02/19 2256 08/03/19 0433 08/03/19 0804  BP:  117/63 124/72 122/69  Pulse:  75 69 68  Resp:  16 16 16   Temp:  98.1 F (36.7 C) (!) 97.5 F (36.4 C) 98.1 F (36.7 C)  TempSrc:  Oral Oral Oral  SpO2: 98%   95%  Weight:        Intake/Output Summary (Last 24 hours) at 08/03/2019 7893 Last data filed at 08/03/2019 0430 Gross per 24 hour  Intake 1569 ml  Output 1500 ml  Net 69 ml   Filed Weights   07/28/19 1735  Weight: 79.4 kg    Examination:  Constitutional: No  distress, patient is in bed Eyes: No scleral icterus ENMT: Moist mucous membranes Neck: normal, supple Respiratory: Clear to auscultation bilaterally, no wheezing, no crackles Cardiovascular: Regular rate and rhythm, no murmurs, no peripheral edema Abdomen: Soft, nontender, nondistended, positive bowel sounds Musculoskeletal: no clubbing / cyanosis.  Skin: No rashes appreciated, dressings bilateral hips CDI Neurologic: No focal deficits  Data Reviewed: I have independently reviewed following labs and imaging studies   CBC: Recent Labs  Lab 07/28/19 1836 07/28/19 1836 07/29/19 0834 07/29/19 0834 07/30/19 0416 07/31/19 0451 08/01/19 0755 08/02/19 0146 08/03/19 0209  WBC 16.0*   < > 14.2*   < > 15.2* 13.1* 14.3* 15.5* 12.3*  NEUTROABS 10.6*  --  10.3*  --   --   --   --   --   --   HGB 14.1   < > 14.5   < > 14.3 10.8* 9.7* 9.0* 7.0*  HCT 42.0   < > 43.0   < > 42.4 32.0* 29.1* 27.3* 20.9*  MCV 89.9   < > 91.1   < > 90.2 91.4 91.5 92.5 92.1  PLT 262   < >  269   < > 250 218 223 244 237   < > = values in this interval not displayed.   Basic Metabolic Panel: Recent Labs  Lab 07/29/19 0834 07/29/19 0834 07/30/19 0416 07/31/19 0451 08/01/19 0755 08/02/19 0146 08/03/19 0209  NA 138   < > 137 139 137 137 139  K 3.1*   < > 3.1* 3.4* 3.7 3.7 4.1  CL 98   < > 101 103 103 101 104  CO2 26   < > 25 26 24 26 24   GLUCOSE 104*   < > 117* 126* 132* 104* 145*  BUN 15   < > 16 21 19 18 21   CREATININE 1.06   < > 1.01 1.21 1.08 1.06 1.01  CALCIUM 10.3   < > 10.5* 9.8 9.7 9.8 9.9  MG 1.8  --   --   --   --  1.9  --    < > = values in this interval not displayed.   Liver Function Tests: Recent Labs  Lab 07/28/19 1836 07/29/19 0834 07/30/19 0416  AST 33 36 35  ALT 39 38 36  ALKPHOS 117 120 121  BILITOT 0.9 0.7 0.9  PROT 7.4 6.8 7.3  ALBUMIN 3.3* 3.3* 3.4*   Coagulation Profile: Recent Labs  Lab 07/28/19 1836 08/02/19 0146  INR 1.3* 1.2   HbA1C: No results for input(s):  HGBA1C in the last 72 hours. CBG: No results for input(s): GLUCAP in the last 168 hours.  Recent Results (from the past 240 hour(s))  SARS Coronavirus 2 by RT PCR (hospital order, performed in [facility] hospital lab) Nasopharyngeal Nasopharyngeal Swab     Status: None   Collection Time: 07/29/19 12:27 AM   Specimen: Nasopharyngeal Swab  Result Value Ref Range Status   SARS Coronavirus 2 NEGATIVE NEGATIVE Final    Comment: (NOTE) SARS-CoV-2 target nucleic acids are NOT DETECTED.  The SARS-CoV-2 RNA is generally detectable in upper and lower respiratory specimens during the acute phase of infection. The lowest concentration of SARS-CoV-2 viral copies this assay can detect is 250 copies / mL. A negative result does not preclude SARS-CoV-2 infection and should not be used as the sole basis for treatment or other patient management decisions.  A negative result may occur with improper specimen collection / handling, submission of specimen other than nasopharyngeal swab, presence of viral mutation(s) within the areas targeted by this assay, and inadequate number of viral copies (<250 copies / mL). A negative result must be combined with clinical observations, patient history, and epidemiological information.  Fact Sheet for Patients:   StrictlyIdeas.no  Fact Sheet for Healthcare Providers: BankingDealers.co.za  This test is not yet approved or  cleared by the [place] FDA and has been authorized for detection and/or diagnosis of SARS-CoV-2 by FDA under an Emergency Use Authorization (EUA).  This EUA will remain in effect (meaning this test can be used) for the duration of the COVID-19 declaration under Section 564(b)(1) of the Act, 21 U.S.C. section 360bbb-3(b)(1), unless the authorization is terminated or revoked sooner.  Performed at [facility], [place] [address] [place], [place] [address]   Surgical pcr screen      Status: None   Collection Time: 07/30/19 10:32 AM   Specimen: Nasal Mucosa; Nasal Swab  Result Value Ref Range Status   MRSA, PCR NEGATIVE NEGATIVE Final   Staphylococcus aureus NEGATIVE NEGATIVE Final    Comment: (NOTE) The Xpert SA Assay (FDA approved for NASAL specimens in  patients 70 years of age and older), is one component of a comprehensive surveillance program. It is not intended to diagnose infection nor to guide or monitor treatment. Performed at [facility], [place] [address], [place], [place] [address]      Radiology Studies: DG C-Arm 1-60 Min  Result Date: 08/02/2019 CLINICAL DATA:  Right intramedullary femoral IM nail for impending femoral fracture. EXAM: RIGHT FEMUR 2 VIEWS; DG C-ARM 1-60 MIN COMPARISON:  Preoperative imaging. FINDINGS: Six fluoroscopic spot views obtained in the operating room in frontal and lateral projections. Placement of intramedullary nail with trans trochanteric and distal locking screws. Known osseous metastatic disease is not well visualized on fluoroscopic spot views. Fluoroscopy time 1 minutes 43 seconds. Dose 7.30 mGy. IMPRESSION: Fluoroscopic spot views during right femoral intramedullary nail placement. Electronically Signed   By: Keith Rake M.D.   On: 08/02/2019 15:30   DG FEMUR, MIN 2 VIEWS RIGHT  Result Date: 08/02/2019 CLINICAL DATA:  Right intramedullary femoral IM nail for impending femoral fracture. EXAM: RIGHT FEMUR 2 VIEWS; DG C-ARM 1-60 MIN COMPARISON:  Preoperative imaging. FINDINGS: Six fluoroscopic spot views obtained in the operating room in frontal and lateral projections. Placement of intramedullary nail with trans trochanteric and distal locking screws. Known osseous metastatic disease is not well visualized on fluoroscopic spot views. Fluoroscopy time 1 minutes 43 seconds. Dose 7.30 mGy. IMPRESSION: Fluoroscopic spot views during right femoral intramedullary nail placement. Electronically Signed   By: Keith Rake  M.D.   On: 08/02/2019 15:30    Marzetta Board, MD, PhD [facility]  Between 7 am - 7 pm I am available, please contact me via Amion or Securechat  Between 7 pm - 7 am I am not available, please contact night coverage MD/APP via Amion

## 2019-08-03 NOTE — Plan of Care (Signed)

## 2019-08-04 LAB — CBC
HCT: 23.2 % — ABNORMAL LOW (ref 39.0–52.0)
Hemoglobin: 7.6 g/dL — ABNORMAL LOW (ref 13.0–17.0)
MCH: 30.2 pg (ref 26.0–34.0)
MCHC: 32.8 g/dL (ref 30.0–36.0)
MCV: 92.1 fL (ref 80.0–100.0)
Platelets: 370 10*3/uL (ref 150–400)
RBC: 2.52 MIL/uL — ABNORMAL LOW (ref 4.22–5.81)
RDW: 13.7 % (ref 11.5–15.5)
WBC: 17.7 10*3/uL — ABNORMAL HIGH (ref 4.0–10.5)
nRBC: 0 % (ref 0.0–0.2)

## 2019-08-04 LAB — BASIC METABOLIC PANEL
Anion gap: 10 (ref 5–15)
BUN: 24 mg/dL — ABNORMAL HIGH (ref 8–23)
CO2: 26 mmol/L (ref 22–32)
Calcium: 9.6 mg/dL (ref 8.9–10.3)
Chloride: 102 mmol/L (ref 98–111)
Creatinine, Ser: 1.07 mg/dL (ref 0.61–1.24)
GFR calc Af Amer: >60 mL/min (ref >60)
GFR calc non Af Amer: >60 mL/min (ref >60)
Glucose, Bld: 98 mg/dL (ref 70–99)
Potassium: 3.2 mmol/L — ABNORMAL LOW (ref 3.5–5.1)
Sodium: 138 mmol/L (ref 135–145)

## 2019-08-04 MED ORDER — FENTANYL CITRATE (PF) 100 MCG/2ML IJ SOLN
25.0000 ug | Freq: Once | INTRAMUSCULAR | Status: AC
  Administered 2019-08-04: 25 ug via INTRAVENOUS
  Filled 2019-08-04: qty 2

## 2019-08-04 MED ORDER — POTASSIUM CHLORIDE CRYS ER 20 MEQ PO TBCR
40.0000 meq | EXTENDED_RELEASE_TABLET | Freq: Once | ORAL | Status: AC
  Administered 2019-08-04: 40 meq via ORAL
  Filled 2019-08-04: qty 2
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
## 2019-08-04 NOTE — Plan of Care (Signed)

## 2019-08-04 NOTE — Progress Notes (Signed)
PROGRESS NOTE    Greg Hughes  EPP:[id number] DOB: 21-Dec-1940 DOA: 07/28/2019 PCP: Tonia Ghent, MD    Chief Complaint  Patient presents with  . Hip Pain    Brief Narrative:   79 year old male with CAD, BPH, OSA, paroxysmal A. fib on Xarelto, recent hospitalization at [facility] in January 2021 for a lung nodule status post resection (I am unable to find pathology reports), came to the hospital with severe low back pain, hip pain and inability to walk. Several weeks ago while he was mowing the lawn felt like his hip was popping out of socket, followed by progressively worsening pain. He was seen in the ED on midnight and x-ray was unremarkable. Pain continued and came back to the hospital. CT scan now concern for multiple metastatic osteolytic lesions and a pathological fracture to the left hip.  He is now status post left hip hemiarthroplasty as well as prophylactic IM nailing of the right femur.    Assessment & Plan:   Principal Problem:   Intractable low back pain Active Problems:   Mixed hyperlipidemia   Essential hypertension   COPD (chronic obstructive pulmonary disease) (HCC)   AF (paroxysmal atrial fibrillation) (HCC)   Gout   Benign prostatic hyperplasia with lower urinary tract symptoms   Chronic diastolic CHF (congestive heart failure) (HCC)   Coronary artery disease involving native coronary artery of native heart   Pathological fracture in neoplastic disease, hip, unspecified, initial encounter for fracture [facility])  Left femoral neck pathological fracture probably secondary to the lytic lesion involving the left femoral neck with associated pathological fracture, additional lytic lesion in the left iliac bone and left sacrum concerning for metastatic disease. Orthopedics consulted and he underwent left hip hemiarthroplasty, posterior approach and open biopsy of the left femoral bone lesion on 07/30/2019.  CT scan showed extensive metastatic disease in the right femur,  and he underwent prophylactic intramedullary fixation of the right femur. PT evaluation recommending SNF at this time.    Metastatic disease of unknown primary Patient had a history of lung nodule s/p resection. Imaging of the chest abdomen and pelvis shows extensive metastatic disease to the bone, liver and there is 6 mm pulmonary nodule in the right lung base. Results from the biopsy are pending. Meanwhile radiation oncology consulted for radiation treatment.   Essential hypertension Resume home medications at this time.   Marland Kitchen  COPD Stable.    Paroxysmal atrial fibrillation Continue with home medications. We will start the patient on Xarelto.   Coronary artery disease Continue with statin and beta-blocker. Patient denies any chest pain at this time.   Hyperlipidemia Continue with statin.   Constipation Continue with MiraLAX.   Historically, patient is also status post C5-C7 ACDF and L3-L5 posterior spinal and interbody  fusion and spinal cord simulator      DVT prophylaxis:  Lovenox.  Code Status: FULL CODE.  Family Communication: none at bedside.  Disposition:   Status is: Inpatient  Remains inpatient appropriate because:Ongoing diagnostic testing needed not appropriate for outpatient work up   Dispo: The patient is from: Home              Anticipated d/c is to: SNF              Anticipated d/c date is: 2 days              Patient currently is not medically stable to d/c.       Consultants:   Orthopedics.  Procedures: s/p IM nailing of the right femur.  Antimicrobials: NONE.   Subjective: PAIN not well controlled.  No nausea, or vomiting, no abd pain.  No chest pain or sob.   Objective: Vitals:   08/03/19 1948 08/04/19 0438 08/04/19 0822 08/04/19 0910  BP: 134/76 (!) 142/78 (!) 148/78 (!) 148/78  Pulse: 79 81 87 87  Resp: 16 16 16 16   Temp: 97.7 F (36.5 C) (!) 97.2 F (36.2 C) 97.8 F (36.6 C) 97.8 F (36.6 C)  TempSrc: Oral  Oral Oral Oral  SpO2: 96% 96% 100%   Weight:    79.3 kg  Height:    5\' 10"  (1.778 m)    Intake/Output Summary (Last 24 hours) at 08/04/2019 1348 Last data filed at 08/04/2019 0438 Gross per 24 hour  Intake 240 ml  Output 400 ml  Net -160 ml   Filed Weights   07/28/19 1735 08/04/19 0910  Weight: 79.4 kg 79.3 kg    Examination:  General exam: Appears calm and comfortable  Respiratory system: Clear to auscultation. Respiratory effort normal. Cardiovascular system: S1 & S2 heard, RRR. No JVD, murmurs, rubs, gallops or clicks. No pedal edema. Gastrointestinal system: Abdomen is nondistended, soft and nontender.  Normal bowel sounds heard. Central nervous system: Alert and oriented. No focal neurological deficits. Extremities: Symmetric 5 x 5 power. Skin: No rashes, lesions or ulcers Psychiatry:  Mood & affect appropriate.     Data Reviewed: I have personally reviewed following labs and imaging studies  CBC: Recent Labs  Lab 07/28/19 1836 07/28/19 1836 07/29/19 0834 07/30/19 0416 07/31/19 0451 08/01/19 0755 08/02/19 0146 08/03/19 0209 08/04/19 0810  WBC 16.0*   < > 14.2*   < > 13.1* 14.3* 15.5* 12.3* 17.7*  NEUTROABS 10.6*  --  10.3*  --   --   --   --   --   --   HGB 14.1   < > 14.5   < > 10.8* 9.7* 9.0* 7.0* 7.6*  HCT 42.0   < > 43.0   < > 32.0* 29.1* 27.3* 20.9* 23.2*  MCV 89.9   < > 91.1   < > 91.4 91.5 92.5 92.1 92.1  PLT 262   < > 269   < > 218 223 244 237 370   < > = values in this interval not displayed.    Basic Metabolic Panel: Recent Labs  Lab 07/29/19 0834 07/30/19 0416 07/31/19 0451 08/01/19 0755 08/02/19 0146 08/03/19 0209 08/04/19 0810  NA 138   < > 139 137 137 139 138  K 3.1*   < > 3.4* 3.7 3.7 4.1 3.2*  CL 98   < > 103 103 101 104 102  CO2 26   < > 26 24 26 24 26   GLUCOSE 104*   < > 126* 132* 104* 145* 98  BUN 15   < > 21 19 18 21  24*  CREATININE 1.06   < > 1.21 1.08 1.06 1.01 1.07  CALCIUM 10.3   < > 9.8 9.7 9.8 9.9 9.6  MG 1.8  --    --   --  1.9  --   --    < > = values in this interval not displayed.    GFR: Estimated Creatinine Clearance: 57.8 mL/min (by C-G formula based on SCr of 1.07 mg/dL).  Liver Function Tests: Recent Labs  Lab 07/28/19 1836 07/29/19 0834 07/30/19 0416  AST 33 36 35  ALT 39 38 36  ALKPHOS 117 120 121  BILITOT 0.9 0.7 0.9  PROT 7.4 6.8 7.3  ALBUMIN 3.3* 3.3* 3.4*    CBG: No results for input(s): GLUCAP in the last 168 hours.   Recent Results (from the past 240 hour(s))  SARS Coronavirus 2 by RT PCR (hospital order, performed in [facility] hospital lab) Nasopharyngeal Nasopharyngeal Swab     Status: None   Collection Time: 07/29/19 12:27 AM   Specimen: Nasopharyngeal Swab  Result Value Ref Range Status   SARS Coronavirus 2 NEGATIVE NEGATIVE Final    Comment: (NOTE) SARS-CoV-2 target nucleic acids are NOT DETECTED.  The SARS-CoV-2 RNA is generally detectable in upper and lower respiratory specimens during the acute phase of infection. The lowest concentration of SARS-CoV-2 viral copies this assay can detect is 250 copies / mL. A negative result does not preclude SARS-CoV-2 infection and should not be used as the sole basis for treatment or other patient management decisions.  A negative result may occur with improper specimen collection / handling, submission of specimen other than nasopharyngeal swab, presence of viral mutation(s) within the areas targeted by this assay, and inadequate number of viral copies (<250 copies / mL). A negative result must be combined with clinical observations, patient history, and epidemiological information.  Fact Sheet for Patients:   StrictlyIdeas.no  Fact Sheet for Healthcare Providers: BankingDealers.co.za  This test is not yet approved or  cleared by the [place] FDA and has been authorized for detection and/or diagnosis of SARS-CoV-2 by FDA under an Emergency Use Authorization  (EUA).  This EUA will remain in effect (meaning this test can be used) for the duration of the COVID-19 declaration under Section 564(b)(1) of the Act, 21 U.S.C. section 360bbb-3(b)(1), unless the authorization is terminated or revoked sooner.  Performed at [facility], [place] [address], [place], [place] [address]   Surgical pcr screen     Status: None   Collection Time: 07/30/19 10:32 AM   Specimen: Nasal Mucosa; Nasal Swab  Result Value Ref Range Status   MRSA, PCR NEGATIVE NEGATIVE Final   Staphylococcus aureus NEGATIVE NEGATIVE Final    Comment: (NOTE) The Xpert SA Assay (FDA approved for NASAL specimens in patients 4 years of age and older), is one component of a comprehensive surveillance program. It is not intended to diagnose infection nor to guide or monitor treatment. Performed at [facility], [place] [address] [place],  [address]          Radiology Studies: DG C-Arm 1-60 Min  Result Date: 08/02/2019 CLINICAL DATA:  Right intramedullary femoral IM nail for impending femoral fracture. EXAM: RIGHT FEMUR 2 VIEWS; DG C-ARM 1-60 MIN COMPARISON:  Preoperative imaging. FINDINGS: Six fluoroscopic spot views obtained in the operating room in frontal and lateral projections. Placement of intramedullary nail with trans trochanteric and distal locking screws. Known osseous metastatic disease is not well visualized on fluoroscopic spot views. Fluoroscopy time 1 minutes 43 seconds. Dose 7.30 mGy. IMPRESSION: Fluoroscopic spot views during right femoral intramedullary nail placement. Electronically Signed   By: Keith Rake M.D.   On: 08/02/2019 15:30   DG FEMUR, MIN 2 VIEWS RIGHT  Result Date: 08/02/2019 CLINICAL DATA:  Right intramedullary femoral IM nail for impending femoral fracture. EXAM: RIGHT FEMUR 2 VIEWS; DG C-ARM 1-60 MIN COMPARISON:  Preoperative imaging. FINDINGS: Six fluoroscopic spot views obtained in the operating room in frontal and lateral  projections. Placement of intramedullary nail with trans trochanteric and distal locking screws. Known osseous metastatic disease is not well  visualized on fluoroscopic spot views. Fluoroscopy time 1 minutes 43 seconds. Dose 7.30 mGy. IMPRESSION: Fluoroscopic spot views during right femoral intramedullary nail placement. Electronically Signed   By: Keith Rake M.D.   On: 08/02/2019 15:30        Scheduled Meds: . allopurinol  300 mg Oral Daily  . Chlorhexidine Gluconate Cloth  6 each Topical Daily  . diltiazem  180 mg Oral Daily  . docusate sodium  100 mg Oral BID  . enoxaparin (LOVENOX) injection  40 mg Subcutaneous Q24H  . finasteride  5 mg Oral Daily  . metoprolol succinate  50 mg Oral Daily  . polyethylene glycol  17 g Oral Daily  . potassium chloride  40 mEq Oral Once  . pregabalin  25 mg Oral BID  . simvastatin  10 mg Oral q1800  . tamsulosin  0.8 mg Oral Daily   Continuous Infusions: . lactated ringers 10 mL/hr at 08/02/19 1132     LOS: 6 days       Hosie Poisson, MD [facility]   To contact the attending provider between 7A-7P or the covering provider during after hours 7P-7A, please log into the web site [email or web address] and access using universal [place] password for that web site. If you do not have the password, please call the hospital operator.  08/04/2019, 1:48 PM
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
## 2019-08-05 ENCOUNTER — Ambulatory Visit
Admit: 2019-08-05 | Discharge: 2019-08-05 | Disposition: A | Discharge status: Home / Self Care | Payer: Medicare HMO | Attending: Radiation Oncology | Admitting: Radiation Oncology

## 2019-08-05 ENCOUNTER — Ambulatory Visit
Admit: 2019-08-05 | Discharge: 2019-08-05 | Disposition: A | Discharge status: Home / Self Care | Payer: Medicare HMO | Source: Ambulatory Visit | Attending: Radiation Oncology | Admitting: Radiation Oncology

## 2019-08-05 ENCOUNTER — Encounter (HOSPITAL_COMMUNITY): Payer: Self-pay | Admitting: Internal Medicine

## 2019-08-05 ENCOUNTER — Ambulatory Visit: Payer: Medicare HMO | Admitting: Student in an Organized Health Care Education/Training Program

## 2019-08-05 VITALS — BP 103/68 | HR 100 | Resp 24

## 2019-08-05 DIAGNOSIS — C7951 Secondary malignant neoplasm of bone: Secondary | ICD-10-CM

## 2019-08-05 DIAGNOSIS — C3432 Malignant neoplasm of lower lobe, left bronchus or lung: Secondary | ICD-10-CM

## 2019-08-05 DIAGNOSIS — Z66 Do not resuscitate: Secondary | ICD-10-CM

## 2019-08-05 DIAGNOSIS — M545 Low back pain: Secondary | ICD-10-CM

## 2019-08-05 DIAGNOSIS — Z510 Encounter for antineoplastic radiation therapy: Secondary | ICD-10-CM | POA: Insufficient documentation

## 2019-08-05 DIAGNOSIS — Z515 Encounter for palliative care: Secondary | ICD-10-CM

## 2019-08-05 DIAGNOSIS — M84559A Pathological fracture in neoplastic disease, hip, unspecified, initial encounter for fracture: Secondary | ICD-10-CM

## 2019-08-05 DIAGNOSIS — Z7189 Other specified counseling: Secondary | ICD-10-CM

## 2019-08-05 LAB — CBC
HCT: 23.2 % — ABNORMAL LOW (ref 39.0–52.0)
Hemoglobin: 7.5 g/dL — ABNORMAL LOW (ref 13.0–17.0)
MCH: 30.1 pg (ref 26.0–34.0)
MCHC: 32.3 g/dL (ref 30.0–36.0)
MCV: 93.2 fL (ref 80.0–100.0)
Platelets: 387 10*3/uL (ref 150–400)
RBC: 2.49 MIL/uL — ABNORMAL LOW (ref 4.22–5.81)
RDW: 13.8 % (ref 11.5–15.5)
WBC: 17.5 10*3/uL — ABNORMAL HIGH (ref 4.0–10.5)
nRBC: 0.3 % — ABNORMAL HIGH (ref 0.0–0.2)

## 2019-08-05 LAB — SURGICAL PATHOLOGY

## 2019-08-05 MED ORDER — HYDROMORPHONE HCL 1 MG/ML IJ SOLN
1.0000 mg | Freq: Once | INTRAMUSCULAR | Status: AC
  Administered 2019-08-05: 1 mg via INTRAVENOUS
  Filled 2019-08-05: qty 1

## 2019-08-05 MED ORDER — RIVAROXABAN 20 MG PO TABS
20.0000 mg | ORAL_TABLET | Freq: Every day | ORAL | Status: DC
  Administered 2019-08-05 – 2019-08-09 (×5): 20 mg via ORAL
  Filled 2019-08-05 (×5): qty 1

## 2019-08-05 MED ORDER — CALCITONIN (SALMON) 200 UNIT/ACT NA SOLN
1.0000 | Freq: Every day | NASAL | Status: DC
  Administered 2019-08-05 – 2019-08-09 (×5): 1 via NASAL
  Filled 2019-08-05: qty 3.7

## 2019-08-05 MED ORDER — METOCLOPRAMIDE HCL 5 MG/ML IJ SOLN
5.0000 mg | Freq: Three times a day (TID) | INTRAMUSCULAR | Status: DC | PRN
  Administered 2019-08-08: 10 mg via INTRAVENOUS
  Filled 2019-08-05: qty 2

## 2019-08-05 MED ORDER — FLEET ENEMA 7-19 GM/118ML RE ENEM
1.0000 | ENEMA | Freq: Once | RECTAL | Status: AC
  Administered 2019-08-05: 1 via RECTAL
  Filled 2019-08-05: qty 1

## 2019-08-05 MED ORDER — MORPHINE SULFATE ER 15 MG PO TBCR: 15.0000 mg | EXTENDED_RELEASE_TABLET | Freq: Two times a day (BID) | ORAL | Status: DC

## 2019-08-05 MED ORDER — HYDROMORPHONE HCL 1 MG/ML IJ SOLN
1.0000 mg | INTRAMUSCULAR | Status: DC | PRN
  Administered 2019-08-05 – 2019-08-07 (×6): 1 mg via INTRAVENOUS
  Filled 2019-08-05 (×6): qty 1

## 2019-08-05 MED ORDER — HYDROMORPHONE HCL 1 MG/ML IJ SOLN
0.5000 mg | INTRAMUSCULAR | Status: DC | PRN
  Administered 2019-08-06: 0.5 mg via INTRAVENOUS
  Filled 2019-08-05: qty 1

## 2019-08-05 MED ORDER — DEXAMETHASONE 4 MG PO TABS
4.0000 mg | ORAL_TABLET | Freq: Two times a day (BID) | ORAL | Status: DC
  Administered 2019-08-05 – 2019-08-06 (×3): 4 mg via ORAL
  Filled 2019-08-05 (×3): qty 1

## 2019-08-05 MED ORDER — ACETAMINOPHEN 500 MG PO TABS
1000.0000 mg | ORAL_TABLET | Freq: Three times a day (TID) | ORAL | Status: DC
  Administered 2019-08-05 – 2019-08-09 (×14): 1000 mg via ORAL
  Filled 2019-08-05 (×14): qty 2

## 2019-08-05 MED ORDER — TRAZODONE HCL 50 MG PO TABS
50.0000 mg | ORAL_TABLET | Freq: Every evening | ORAL | Status: DC | PRN
  Administered 2019-08-06 – 2019-08-08 (×3): 50 mg via ORAL
  Filled 2019-08-05 (×3): qty 1

## 2019-08-05 MED ORDER — METOCLOPRAMIDE HCL 5 MG PO TABS: 5.0000 mg | ORAL_TABLET | Freq: Three times a day (TID) | ORAL | Status: DC | PRN

## 2019-08-05 MED ORDER — SENNA 8.6 MG PO TABS
1.0000 | ORAL_TABLET | Freq: Two times a day (BID) | ORAL | Status: DC
  Administered 2019-08-05 – 2019-08-09 (×8): 8.6 mg via ORAL
  Filled 2019-08-05 (×7): qty 1

## 2019-08-05 MED ORDER — PANTOPRAZOLE SODIUM 40 MG PO TBEC
40.0000 mg | DELAYED_RELEASE_TABLET | Freq: Every day | ORAL | Status: DC
  Administered 2019-08-05 – 2019-08-09 (×5): 40 mg via ORAL
  Filled 2019-08-05 (×5): qty 1

## 2019-08-05 NOTE — TOC Progression Note (Signed)
Transition of Care [facility]) - Progression Note    Patient Details  Name: MARKE GOODWYN MRN: [id number] Date of Birth: [birth year]  Transition of [facility] CM/SW Contact  Sharin Mons, RN Phone Number: 08/05/2019, 3:11 PM  Clinical Narrative:    NCM received consult: Consult to hospice, patients lives in [place] would be good to consult [facility].  NCM spoke with pt @ bedside regarding hospice care. Pt requested NCM speak with wife. NCM spoke with wife in waiting area and wife confirmed d/c plan home with hospice care. Choice offered and Manufacturing engineer selected. Wife states will need hospital bed and [facility] for home. NCM made referral with Manufacturing engineer, Anderson Malta. [facility] to f/u with pt/wife.  TOC team will continue to monitor for needs .../  Mikyle Sox (Spouse)     [phone]       Expected Discharge Plan: Home w Hospice Care Barriers to Discharge: Continued Medical Work up  Expected Discharge Plan and Services Expected Discharge Plan: Home w Hospice Care In-house Referral: Clinical Social Work Discharge Planning Services: CM Consult Post Acute Care Choice: Home Health, Durable Medical Equipment (vs. SNF) Living arrangements for the past 2 months: Single Family Home                                       Social Determinants of Health (SDOH) Interventions    Readmission Risk Interventions Readmission Risk Prevention Plan 07/31/2019  Transportation Screening Complete  PCP or Specialist Appt within 5-7 Days Not Complete  Not Complete comments disposition pending  Home Care Screening Complete  Medication Review (RN CM) Referral to Pharmacy  Some recent data might be hidden

## 2019-08-05 NOTE — Consult Note (Signed)
Palliative Medicine Inpatient Consult Note  Reason for consult:  Pain Management  HPI:  Per intake H&P --> 79 year old male with CAD, BPH, OSA, paroxysmal A. fib on Xarelto, recent hospitalization at [facility] in January 2021 for a lung nodule status post resection (I am unable to find pathology reports), came to the hospital with severe low back pain, hip pain and inability to walk. CT revealed multiple metastatic osteolytic lesions and a pathological fractureto the left hip. Patient underwent a left hip hemiarthroplasty as well as prophylactic IM nailing of the right femur. Today is going to [facility] for radiation simulation.   Palliative care was asked to get involved to aid in symptom relief.   Clinical Assessment/Goals of Care: I have reviewed medical records including EPIC notes, labs and imaging, received report from bedside RN, assessed the patient.    I met with Greg Hughes and his wife, Greg Hughes  to further discuss diagnosis prognosis, GOC, EOL wishes, disposition and options.   I introduced Palliative Medicine as specialized medical care for people living with serious illness. It focuses on providing relief from the symptoms and stress of a serious illness. The goal is to improve quality of life for both the patient and the family.  Greg Hughes is from [place] originally. He worked for [employer] in [place] then [place] for fifteen years. He and his wife decided to retired in [place] as it felt like their home town in [place]. Greg Hughes lives at home with his wife, Greg Hughes.  They have been married for over fifty seven years years and have  Five children. 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   As far as function status prior to six weeks ago [name] was able to do bADLs on his own. He was still mowing his lawn regularly. He had declined in the setting of what was originally  thought to be back pain causing pain to the hip. He has a neurostimulator that was interrogated at [facility] to no avail. He ended up turing it off as his pain was continuing to worsen over time. He said that he was "going down hill fast". He recounted the events that led to today including his diagnosis of a lung nodule that was resected in 2015-2016. He said that after this he had assumed everything was okay. It was not until this hospital admission that his bony metastatic disease was identified.  A detailed discussion was had today regarding advanced directives, his wife Greg Hughes would make decisions if he were unable to do so on his own.  Concepts specific to code status, artifical feeding and hydration, continued IV antibiotics and rehospitalization was had.   I completed a MOST form today. The patient and family outlined their wishes for the following treatment decisions:  Cardiopulmonary Resuscitation: Do Not Attempt Resuscitation (DNR/No CPR)  Medical Interventions: Comfort Measures: Keep clean, warm, and dry. Use medication by any route, positioning, wound care, and other measures to relieve pain and suffering. Use oxygen, suction and manual treatment of airway obstruction as needed for comfort. Do not transfer to the hospital unless comfort needs cannot be met in current location.  Antibiotics: Determine use of limitation of antibiotics when infection occurs  IV Fluids: IV fluids for a defined trial period  Feeding Tube: No feeding tube   The difference between a aggressive medical intervention path  and a palliative comfort care path  for this patient at this time was had. Carrel and his wife have had many experiences with hospice in the past all which were favorable. They are interested in speaking to one of our local hospice agencies to identify next steps. Greg Hughes shares that she wants Broady to be home with her. She is a retired Therapist, sports and has the ability to provide the comprehensive care he will need  as he transitions.   Symptoms: Greg Hughes states that he has persistent pain "all over"  It is identified as a 9-10/10 pain. We discussed bony metastasis and how uncomfortable that can be. We talked about starting some steroids to aid in relief as well as calcitonin. We discussed that in the oncoming days we could start a long acting opoid if he still feels quite uncomfortable. [name] states that he has been horribly constipated, we discussed giving him an enema today. He has poor sleep hygiene in the hospital which we will order PRN trazodone for. He has not urinated well, we will very there is no retention with a bladder scan. We have changed the indication for metoclopramide for hiccups. Greg Hughes denies nausea, dyspnea, and pruritis.   Discussed the importance of continued conversation with family and their  medical providers regarding overall plan of care and treatment options, ensuring decisions are within the context of the patients values and GOCs.  Decision Maker: Greg Hughes (Spouse) [phone]  SUMMARY OF RECOMMENDATIONS   DNAR/DNI  MOST completed and placed in the chart  Comprehensive symptom management as below  TOC --> Consult for Hospice  Code Status/Advance Care Planning: DNAR/DNI   Symptom Management:  Intractable Lower Back Pain: Metastatic Bone Pain:  - Tylenol 1g PO TID  - Decadron 75m PO BID  - Continue PRN dilaudid only give IV if orals are ineffective  - Calcitonin 1 spray alternating nares daily   - Continue lyrica  - Would consider starting MS Contin 162mPO BID if pain remains uncontrolled (morphine equivalents 10059mn last 24 hours)  Constipation:  - Continue miralax 17g PO QDay  - senna 1 tab PO BID  - Fleet enema  Urinary Retention:  - Bladder scan Qshift  Insomnia:  - Trazodone 45m75m QHS PRN  Hiccups:  - Metoclopramide ordered though have changed indication  Insomnia:  - Trazodone 45mg40mQHS PRN  Ppx:   - Protonix 40mg 24mDay     Palliative Prophylaxis:   Pain, turn Q2H, Mobility, Oral care, constipation  Additional Recommendations (Limitations, Scope, Preferences):  Treat the treatable   Psycho-social/Spiritual:   Desire for further Chaplaincy support: No  Additional Recommendations: Education on hospice   Prognosis: Unclear though the significant of mets would indicate that hospice is appropriate  Discharge Planning: Discharge home likely with hospice  Review of Systems  Constitutional: Positive for malaise/fatigue.  Gastrointestinal: Positive for constipation.  Skin: Positive for itching.   Vitals with BMI 08/05/2019 08/05/2019 08/04/2019  Height - - -  Weight - - -  BMI - - -  Systolic 136 [id number] 89 88 77  Pulse - 100 90   Physical Exam Vitals reviewed.  HENT:     Head: Normocephalic.     Nose: Nose normal.     Mouth/Throat:     Mouth: Mucous membranes are dry.  Eyes:     Pupils: Pupils are equal, round, and reactive to light.  Musculoskeletal:     Cervical back: Normal range of motion.  Neurological:     Mental  Status: He is alert.   PPS: 30%   This conversation/these recommendations were discussed with patient primary care team, Dr. Karleen Hampshire  Time In: 1200 Time Out: 1345 Total Time:  105 minutes Greater than 50%  of this time was spent counseling and coordinating care related to the above assessment and plan.  [place] Team Team Cell Phone: [phone] Please utilize secure chat with additional questions, if there is no response within 30 minutes please call the above phone number  Palliative Medicine Team providers are available by phone from 7am to 7pm daily and can be reached through the team cell phone.  Should this patient require assistance outside of these hours, please call the patient's attending physician.

## 2019-08-05 NOTE — Progress Notes (Signed)
Fleet enema given. Patient was very impacted. Large amount of stool obtained. Patient reports feeing better.

## 2019-08-05 NOTE — Consult Note (Addendum)
Radiation Oncology         [phone]) [phone] ________________________________  Name: Greg Hughes        MRN: [id number]  Date of Service: 08/05/19  DOB: 01-05-1941  ZT:[id number], [name], MD    REFERRING PHYSICIAN: Dr. Karleen Hampshire   DIAGNOSIS: The primary encounter diagnosis was Acute midline low back pain with left-sided sciatica. Diagnoses of Closed fracture of left femur ([place]), Intractable low back pain, Surgery, elective, Pathologic fracture of femoral neck, left, initial encounter [facility]), Elective surgery, Postop check, and Postop check were also pertinent to this visit.   HISTORY OF PRESENT ILLNESS: Greg Hughes is a 79 y.o. male seen at the request of Dr. Karleen Hampshire for painful bone metastases.  The patient has a history of pulmonary nodules that were being followed and progressed in 2020.  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  He was counseled on close surveillance and was scheduled to be followed for CT scan in July 2021.  He recently started having problems with pain in the hips and low back area.  He underwent a spinal cord stimulator in January 2021, and on 07/29/2019 presented to [facility] with complains of severe low back and hip pain resulting in the inability to walk.  Apparently this was also preceded by a sensation of feeling his hip pop out of his socket while mowing a few weeks of ago.  On 07/03/2019 he presented to the ED with hip pain and plain films were unremarkable.  Upon arriving to the emergency room an MRI of the lumbar spine did not reveal any acute disease, however  further CT imaging of the chest abdomen and pelvis was performed as well as CT imaging of his femurs.  He had trace bilateral pleural effusions, a new 6 mm pulmonary nodule at the right lung base, multiple low-attenuation lesions in the liver the largest of which measuring approximately 1.7 cm at the dome.  Also multiple lytic lesions were identified including along the right scapula measuring 2.6 cm, lytic lesions involving the left aspect of the manubrium measuring 1.4 cm, and 1.9 cm, there was a lytic lesion involving the left aspect of T9-T10, with extraosseous disease measuring up to 3.9 x 3.6 cm at T10. There is also disease in the ileum abutting the sacroiliac joint measuring 3 x 1.9 cm, lytic lesions in the left hemisacrum and adjacent ilium measuring 5.6 x 4.7 cm with pathologic fracture of the left femoral neck, soft tissue mass in that location measuring 5.5 x 3.8 cm, lytic lesion of the right femoral neck with erosion of the posterior cortex measuring 4 x 3.5 cm.  He underwent left hemiarthroplasty of the hip on 07/30/2019, pathology from the procedure has been reviewed, and discussing his case with the pathologist, it is felt to be a high-grade carcinoma consistent with his lung primary.  On 08/02/2019 he also went back to the operating room to undergo intramedullary nailing of the right femur.  He is seen and his wife contacted today to discuss options of palliative radiotherapy to his sites of painful disease.   PREVIOUS RADIATION THERAPY: No   PAST MEDICAL  HISTORY:  Past Medical History:  Diagnosis Date  . A-fib ([place]) 2013  . Adenomatous colon polyp 04/2008  . BPH (benign prostatic hypertrophy)   . CAD (coronary artery disease)   . 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 Complication of anesthesia    " I fainted a couple of times when they went to get me up."  . Early cataract   . ED (erectile dysfunction)   . GERD (gastroesophageal reflux disease)   .  Hernia, abdominal    small  . History of colon polyps   . Hyperlipidemia   . Hypertension   . Lumbar herniated disc   . Lung nodule   . Microscopic hematuria   . Right arm weakness 04/17/2015  . Spinal stenosis   . Tobacco abuse        PAST SURGICAL HISTORY: Past Surgical History:  Procedure Laterality Date  . ANTERIOR CERVICAL DECOMP/DISCECTOMY FUSION N/A 06/16/2014   Procedure: ANTERIOR CERVICAL DECOMPRESSION/DISCECTOMY FUSION CERVICAL FIVE-SIX,CERVICAL SIX-SEVEN;  Surgeon: Eustace Moore, MD;  Location: [place] [facility];  Service: Neurosurgery;  Laterality: N/A;  . BACK SURGERY    . COLONOSCOPY  2002  . FEMUR IM NAIL Right 08/02/2019   Procedure: INTRAMEDULLARY (IM) NAIL FEMORAL;  Surgeon: Rod Can, MD;  Location: [place];  Service: Orthopedics;  Laterality: Right;  . HIP ARTHROPLASTY Left 07/30/2019   Procedure: LEFT HEMIARTHROPLASTY HIP;  Surgeon: Rod Can, MD;  Location: [place];  Service: Orthopedics;  Laterality: Left;  . INGUINAL HERNIA REPAIR    . SPINAL CORD STIMULATOR IMPLANT  2020   at [facility]  . THOROCOTOMY WITH LOBECTOMY Left 2021  . TONSILLECTOMY       FAMILY HISTORY:  Family History  Problem Relation Age of Onset  . Diabetes Father   . Coronary artery disease Father   . Stroke Father   . Arthritis Mother   . Hiatal hernia Mother   . Pulmonary embolism Mother   . Colon cancer Neg Hx   . Prostate cancer Neg Hx      SOCIAL HISTORY:  reports that he has been smoking cigarettes. He has a 60.00 pack-year smoking history. He has never used smokeless tobacco. He reports that he does not drink alcohol and does not use drugs.   ALLERGIES: Patient has no known allergies.   MEDICATIONS:  Current Facility-Administered Medications  Medication Dose Route Frequency Provider Last Rate Last Admin  . acetaminophen (TYLENOL) tablet 650 mg  650 mg Oral Q6H PRN Rod Can, MD   650 mg at 08/05/19 0636   Or  . acetaminophen (TYLENOL) suppository 650 mg   650 mg Rectal Q6H PRN Swinteck, Aaron Edelman, MD      . allopurinol (ZYLOPRIM) tablet 300 mg  300 mg Oral Daily Rod Can, MD   300 mg at 08/05/19 0816  . alum & mag hydroxide-simeth (MAALOX/MYLANTA) 200-200-20 MG/5ML suspension 15 mL  15 mL Oral Q6H PRN Rod Can, MD   15 mL at 08/01/19 1220  . Chlorhexidine Gluconate Cloth 2 % PADS 6 each  6 each Topical Daily Rod Can, MD   6 each at 08/04/19 1001  . diltiazem (CARDIZEM CD) 24 hr capsule 180 mg  180 mg Oral Daily Rod Can, MD   180 mg at 08/05/19 0814  . docusate sodium (COLACE) capsule 100 mg  100 mg Oral BID Rod Can, MD   100 mg at 08/05/19 0814  . enoxaparin (LOVENOX) injection 40 mg  40 mg Subcutaneous Q24H Rod Can, MD   40 mg at 08/05/19 0813  . finasteride (PROSCAR) tablet 5 mg  5 mg Oral Daily Rod Can, MD   5 mg at 08/05/19 0814  . HYDROmorphone (DILAUDID) injection 0.5 mg  0.5 mg Intravenous Q3H PRN Rod Can, MD   0.5 mg at 08/04/19 0001   Or  . HYDROmorphone (DILAUDID) injection 1 mg  1 mg Intravenous Q3H PRN Rod Can, MD   1 mg at 08/05/19 0815  . HYDROmorphone (DILAUDID) tablet 2 mg  2 mg Oral Q3H PRN Rod Can, MD   2 mg at 08/04/19 2137  . lactated ringers infusion   Intravenous Continuous Rod Can, MD 10 mL/hr at 08/02/19 1132 Restarted at 08/02/19 1255  . menthol-cetylpyridinium (CEPACOL) lozenge 3 mg  1 lozenge Oral PRN Swinteck, Aaron Edelman, MD       Or  . phenol (CHLORASEPTIC) mouth spray 1 spray  1 spray Mouth/Throat PRN Swinteck, Aaron Edelman, MD      . metoCLOPramide (REGLAN) tablet 5-10 mg  5-10 mg Oral Q8H PRN Swinteck, Aaron Edelman, MD       Or  . metoCLOPramide (REGLAN) injection 5-10 mg  5-10 mg Intravenous Q8H PRN Swinteck, Aaron Edelman, MD      . metoprolol succinate (TOPROL-XL) 24 hr tablet 50 mg  50 mg Oral Daily Rod Can, MD   50 mg at 08/05/19 0814  . ondansetron (ZOFRAN) tablet 4 mg  4 mg Oral Q6H PRN Swinteck, Aaron Edelman, MD       Or  . ondansetron (ZOFRAN)  injection 4 mg  4 mg Intravenous Q6H PRN Swinteck, Aaron Edelman, MD      . polyethylene glycol (MIRALAX / GLYCOLAX) packet 17 g  17 g Oral Daily Rod Can, MD   17 g at 08/05/19 5366  . pregabalin (LYRICA) capsule 25 mg  25 mg Oral BID Rod Can, MD   25 mg at 08/05/19 0815  . simvastatin (ZOCOR) tablet 10 mg  10 mg Oral q1800 Rod Can, MD   10 mg at 08/04/19 1757  . tamsulosin (FLOMAX) capsule 0.8 mg  0.8 mg Oral Daily Rod Can, MD   0.8 mg at 08/05/19 0815     REVIEW OF SYSTEMS: On review of systems, the patient is in terrible pain, on our phone call he is hardly able to engage in the conversation, he requests further assistance with pain management, and his wife describes his symptoms being present and progressive in the last few weeks.  He denies any inability to hold his urine or stool.  He denies loss of sensation of his lower extremities but has had pain in his upper chest posteriorly that at times can be described as tingling.  This is not constant however.  He has been having trouble with constipation, and is taking cathartics at this time.  No other complaints are verbalized.    PHYSICAL EXAM:  Wt Readings from Last 3 Encounters:  08/04/19 174 lb 13.2 oz (79.3 kg)  07/27/19 173 lb (78.5 kg)  07/21/19 173 lb (78.5 kg)   Temp Readings from Last 3 Encounters:  08/05/19 98.4 F (36.9 C) (Oral)  07/27/19 97.7 F (36.5 C) (Temporal)  07/21/19 (!) 97.2 F (36.2 C)   BP Readings from Last 3 Encounters:  08/05/19 126/88  07/27/19 136/76  07/21/19 122/78   Pulse Readings from Last 3 Encounters:  08/05/19 100  07/27/19 64  07/21/19 62   Pain Assessment Pain Score: 8 /10  In general this is a  well appearing Caucasian male in no acute distress.  He is however in pain, and requests pain medication in our department.'s alert and oriented x4 and appropriate throughout the examination. Cardiopulmonary assessment is negative for acute distress and he exhibits normal  effort.     ECOG = 4  0 - Asymptomatic (Fully active, able to carry on all predisease activities without restriction)  1 - Symptomatic but completely ambulatory (Restricted in physically strenuous activity but ambulatory and able to carry out work of a light or sedentary nature. For example, light housework, office work)  2 - Symptomatic, <50% in bed during the day (Ambulatory and capable of all self care but unable to carry out any work activities. Up and about more than 50% of waking hours)  3 - Symptomatic, >50% in bed, but not bedbound (Capable of only limited self-care, confined to bed or chair 50% or more of waking hours)  4 - Bedbound (Completely disabled. Cannot carry on any self-care. Totally confined to bed or chair)  5 - Death   Eustace Pen MM, Creech RH, Tormey DC, et al. [phone]). "Toxicity and response criteria of the [facility]". [place] Oncol. 5 (6): 649-55    LABORATORY DATA:  Lab Results  Component Value Date   WBC 17.5 (H) 08/05/2019   HGB 7.5 (L) 08/05/2019   HCT 23.2 (L) 08/05/2019   MCV 93.2 08/05/2019   PLT 387 08/05/2019   Lab Results  Component Value Date   NA 138 08/04/2019   K 3.2 (L) 08/04/2019   CL 102 08/04/2019   CO2 26 08/04/2019   Lab Results  Component Value Date   ALT 36 07/30/2019   AST 35 07/30/2019   ALKPHOS 121 07/30/2019   BILITOT 0.9 07/30/2019      RADIOGRAPHY: CT CHEST W CONTRAST  Result Date: 07/29/2019 CLINICAL DATA:  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 CONTRAST:  135mL OMNIPAQUE IOHEXOL 300 MG/ML  SOLN COMPARISON:  CT left hip, 07/29/2019, MR lumbar spine, 07/28/2019 FINDINGS: CT CHEST FINDINGS Cardiovascular: Aortic  atherosclerosis. Normal heart size. Three-vessel coronary artery calcifications and stents. No pericardial effusion. Mediastinum/Nodes: No enlarged mediastinal, hilar, or axillary lymph nodes. There is thickening of the esophagus near the gastroesophageal junction, likely a small hiatal hernia (series 5, image 73). Thyroid gland and trachea demonstrate no significant findings. Lungs/Pleura: Mild centrilobular emphysema. Trace bilateral pleural effusions and bandlike scarring or atelectasis of the bilateral lung bases. Evidence of prior left lower lobe wedge resection. Nonspecific 6 mm pulmonary nodule of the right lung base, new compared to remote prior CT dated 05/30/2006 (series 4, image 108). Musculoskeletal: Lytic lesion involving the spine of the right scapula measuring 2.6 x 1.9 cm (series 3, image 1). Subtle lytic lesions involving the left manubrium measuring 1.4 x 1.3 cm (series 3, image 11) and the inner table of the inferior manubrium measuring 1.9 x 1.4 cm (series 3, image 16). There is a lytic osseous lesion involving the left aspect of the T9 and T10 vertebral bodies with and extraosseous soft tissue component, measuring 3.9 x 3.6 cm at the level of T10 (series 3, image 43). CT ABDOMEN PELVIS FINDINGS Hepatobiliary: There are multiple low-attenuation lesions of the liver, the majority of which appear to be simple cysts and stable  compared to remote prior examination dated 05/30/2006, however there are multiple additional, ill-defined hypodense lesions, for example of the liver dome measuring 1.7 x 1.1 cm (series 3, image 46) and of the anterior right lobe of the liver measuring 1.6 x 1.5 cm (series 3, image 63). No gallstones, gallbladder wall thickening, or biliary dilatation. Pancreas: Unremarkable. No pancreatic ductal dilatation or surrounding inflammatory changes. Spleen: Normal in size without significant abnormality. Adrenals/Urinary Tract: Right adrenal nodule measuring 2.2 x 2.1 cm, enlarged  compared to prior examination dated 05/30/2006 although low-attenuation, HU = 9, and very likely a benign adenoma (series 3, image 55). There are bilateral simple cysts of the kidneys. 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 Bladder is unremarkable. Stomach/Bowel: Stomach is within normal limits. Appendix appears normal. No evidence of bowel wall thickening, distention, or inflammatory changes. Sigmoid diverticulosis. Moderate burden of stool balls throughout the colon. Vascular/Lymphatic: Severe aortic atherosclerosis. No enlarged abdominal or pelvic lymph nodes. Reproductive: No mass or other abnormality. Other: No abdominal wall hernia or abnormality. Evidence of prior right inguinal hernia repair. No abdominopelvic ascites. Musculoskeletal: Lytic lesion of the right aspect of the ilium abutting the sacroiliac joint measuring 3.0 x 1.9 cm (series 3, image 101). Previously described lytic lesions involving the left hemi sacrum and adjacent ilium, measuring 5.6 x 4.7 cm (series 3, image 100) and with a pathologic fracture of the left femoral neck, soft tissue mass measuring 5.5 x 3.8 cm (series 3, image 12). Lytic lesion of the right femoral neck with erosion of the posterior cortex measuring 4.0 x 3.5 cm (series 3, image 125). IMPRESSION: 1. Multifocal osseous metastatic disease as detailed above, including a previously identified pathologic fracture of the left femoral neck. There are multiple additional lesions of mechanical significance at risk for pathologic fracture, particularly of the right femoral neck and T9 and T10 vertebral bodies. 2. Ill-defined low-attenuation lesions of the liver consistent with hepatic metastatic disease. 3. There is no candidate primary lesion confidently identified in the chest, abdomen, or pelvis. 4. Thickening of the esophagus near the gastroesophageal junction, likely a small hiatal hernia, esophageal malignancy less favored and without secondary findings such as  adjacent lymphadenopathy. 5. Nonspecific 6 mm pulmonary nodule of the right lung base, new compared to remote prior CT dated 05/30/2006 although generally an unlikely solitary manifestation of pulmonary metastatic disease. Attention on follow-up. 6. Right adrenal nodule, which is enlarged compared to prior examination dated 05/30/2006 although low-attenuation and very likely a benign adenoma. Attention on follow-up. 7. There is a pathologic fracture of the left femoral neck with soft tissue mass measuring 5.5 x 3.8 cm. 8. Trace bilateral pleural effusions without obvious nodularity or other manifestations of pleural metastatic disease. 9. Evidence of prior left lower lobe wedge resection. 10. Other chronic, incidental, and postoperative findings as detailed above. Coronary artery disease. Emphysema (ICD10-J43.9). Aortic Atherosclerosis (ICD10-I70.0). Electronically Signed   By: Eddie Candle M.D.   On: 07/29/2019 08:29   MR LUMBAR SPINE WO CONTRAST  Result Date: 07/28/2019 CLINICAL DATA:  Back pain with left lower extremity weakness EXAM: MRI LUMBAR SPINE WITHOUT CONTRAST TECHNIQUE: Multiplanar, multisequence MR imaging of the lumbar spine was performed. No intravenous contrast was administered. COMPARISON:  04/24/2017 FINDINGS: Some sequences had to be omitted due to SAR restrictions related to the patient's spinal stimulator. Segmentation: Standard. Alignment:  Physiologic. Vertebrae:  L3-5 PLIF.  No acute abnormality. Conus medullaris and cauda equina: Conus extends to the L1 level. Conus and cauda equina appear normal.  Paraspinal and other soft tissues: Negative Disc levels: L1-L2: Normal disc space and facet joints. There is no spinal canal stenosis. No neural foraminal stenosis. L2-L3: Mild disc bulge. Endplate spurring. There is no spinal canal stenosis. Mild bilateral neural foraminal stenosis. L3-L4: PLIF. There is no spinal canal stenosis. Limited visualization of the neural foramina due to  susceptibility effects from spinal hardware. Within that limitation, no visible neural foraminal stenosis. L4-L5: PLIF. There is no spinal canal stenosis. Limited visualization of the neural foramina due to susceptibility effects from spinal hardware. Within that limitation, no visible neural foraminal stenosis. L5-S1: Normal disc space and facet joints. There is no spinal canal stenosis. No neural foraminal stenosis. Visualized sacrum: Normal. IMPRESSION: 1. Scan time had to be limited due to SAR restrictions imposed by the patient's spinal stimulator device. This caused the examination to be truncated and of reduced image quality. 2. L3-5 PLIF without spinal canal stenosis. No visible foraminal stenosis within limitations caused by adjacent hardware. 3. Mild bilateral L2-3 neural foraminal stenosis. Electronically Signed   By: Ulyses Jarred M.D.   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 CONTRAST:  18mL OMNIPAQUE IOHEXOL 300 MG/ML  SOLN COMPARISON:  CT left hip, 07/29/2019, MR lumbar spine, 07/28/2019 FINDINGS: CT CHEST FINDINGS Cardiovascular: Aortic atherosclerosis. Normal heart size. Three-vessel coronary artery calcifications and stents. No pericardial effusion. Mediastinum/Nodes: No enlarged mediastinal, hilar, or axillary lymph nodes. There is thickening of the esophagus near the gastroesophageal junction, likely a small hiatal hernia (series 5, image 73). Thyroid gland and trachea demonstrate no significant findings. Lungs/Pleura: Mild centrilobular emphysema. Trace bilateral pleural effusions and bandlike scarring or atelectasis of the  bilateral lung bases. Evidence of prior left lower lobe wedge resection. Nonspecific 6 mm pulmonary nodule of the right lung base, new compared to remote prior CT dated 05/30/2006 (series 4, image 108). Musculoskeletal: Lytic lesion involving the spine of the right scapula measuring 2.6 x 1.9 cm (series 3, image 1). Subtle lytic lesions involving the left manubrium measuring 1.4 x 1.3 cm (series 3, image 11) and the inner table of the inferior manubrium measuring 1.9 x 1.4 cm (series 3, image 16). There is a lytic osseous lesion involving the left aspect of the T9 and T10 vertebral bodies with and extraosseous soft tissue component, measuring 3.9 x 3.6 cm at the level of T10 (series 3, image 43). CT ABDOMEN PELVIS FINDINGS Hepatobiliary: There are multiple low-attenuation lesions of the liver, the majority of which appear to be simple cysts and stable compared to remote prior examination dated 05/30/2006, however there are multiple additional, ill-defined hypodense lesions, for example of the liver dome measuring 1.7 x 1.1 cm (series 3, image 46) and of the anterior right lobe of the liver measuring 1.6 x 1.5 cm (series 3, image 63). No gallstones, gallbladder wall thickening, or biliary dilatation. Pancreas: Unremarkable. No pancreatic ductal dilatation or surrounding inflammatory changes. Spleen: Normal in size without significant abnormality. Adrenals/Urinary Tract: Right adrenal nodule measuring 2.2 x 2.1 cm, enlarged compared to prior examination dated 05/30/2006 although low-attenuation, HU = 9, and very likely a benign adenoma (series 3, image 55). There are bilateral simple cysts of the kidneys. 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 Bladder is unremarkable. Stomach/Bowel: Stomach is within normal limits. Appendix appears normal. No evidence of bowel wall thickening, distention, or inflammatory changes. Sigmoid diverticulosis. Moderate burden of stool balls throughout the colon.  Vascular/Lymphatic: Severe aortic atherosclerosis. No enlarged abdominal or pelvic lymph nodes. Reproductive: No mass or other abnormality. Other: No abdominal wall hernia or abnormality. Evidence of prior right inguinal hernia repair. No abdominopelvic ascites. Musculoskeletal: Lytic lesion of the right aspect of the ilium abutting the sacroiliac joint measuring 3.0 x 1.9 cm (series 3, image 101). Previously described lytic lesions involving the left hemi sacrum and adjacent ilium, measuring 5.6 x 4.7 cm (series 3, image 100) and with a pathologic fracture of the left femoral neck, soft tissue mass measuring 5.5 x 3.8 cm (series 3, image 12). Lytic lesion of the right femoral neck with erosion of the posterior cortex measuring 4.0 x 3.5 cm (series 3, image 125). IMPRESSION: 1. Multifocal osseous metastatic disease as detailed above, including a previously identified pathologic fracture of the left femoral neck. There are multiple additional lesions of mechanical significance at risk for pathologic fracture, particularly of the right femoral neck and T9 and T10 vertebral bodies. 2. Ill-defined low-attenuation lesions of the liver consistent with hepatic metastatic disease. 3. There is no candidate primary lesion confidently identified in the chest, abdomen, or pelvis. 4. Thickening of the esophagus near the gastroesophageal junction, likely a small hiatal hernia, esophageal malignancy less favored and without secondary findings such as adjacent lymphadenopathy. 5. Nonspecific 6 mm pulmonary nodule of the right lung base, new compared to remote prior CT dated 05/30/2006 although generally an unlikely solitary manifestation of pulmonary metastatic disease. Attention on follow-up. 6. Right adrenal nodule, which is enlarged compared to prior examination dated 05/30/2006 although low-attenuation and very likely a benign adenoma. Attention on follow-up. 7. There is a pathologic fracture of the left femoral neck with soft  tissue mass measuring 5.5 x 3.8 cm. 8. Trace bilateral pleural effusions without obvious nodularity or other manifestations of pleural metastatic disease. 9. Evidence of prior left lower lobe wedge resection. 10. Other chronic, incidental, and postoperative findings as detailed above. Coronary artery disease. Emphysema (ICD10-J43.9). Aortic Atherosclerosis (ICD10-I70.0). Electronically Signed   By: Eddie Candle M.D.   On: 07/29/2019 08:29   CT HIP LEFT WO CONTRAST  Result Date: 07/29/2019 CLINICAL DATA:  Severe hip pain. EXAM: CT OF THE LEFT HIP WITHOUT CONTRAST TECHNIQUE: Multidetector CT imaging of the left hip was performed according to the standard protocol. Multiplanar CT image reconstructions were also generated. COMPARISON:  MRI dated June 2021 FINDINGS: Bones/Joint/Cartilage There is a lytic mass eroding the left sacrum and left iliac bone measuring approximately 6.5 cm. There is a lytic lesion involving the left femoral neck with an associated pathologic fracture. Ligaments Suboptimally assessed by CT. Muscles and Tendons The muscles are unremarkable where visualized. Soft tissues There is a small fat containing left inguinal hernia. There is scattered colonic diverticula without CT evidence for diverticulitis. The bladder is distended. IMPRESSION: 1. Lytic lesion involving the left femoral neck with associated pathologic fracture. 2. There is an additional lytic lesion in the left iliac bone and left sacrum. 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 3. Diverticulosis without CT evidence for diverticulitis. 4. Distended urinary bladder. 5. Fat containing left inguinal hernia. These results will be called to the ordering clinician or representative by the Radiologist Assistant, and communication documented in the PACS or [employer]. Electronically Signed   By:  Constance Holster M.D.   On: 07/29/2019 03:27   CT FEMUR LEFT WO CONTRAST  Result Date: 07/30/2019 CLINICAL  DATA:  Pathologic fracture of the left femur EXAM: CT OF THE LOWER LEFT EXTREMITY WITHOUT CONTRAST TECHNIQUE: Multidetector CT imaging of the lower left extremity was performed according to the standard protocol. COMPARISON:  CT and x-ray 07/29/2019 FINDINGS: Bones/Joint/Cartilage Expansile lytic mass centered at the left femoral neck with comminuted pathologic fracture involving the basicervical and intertrochanteric aspects of the femur. Fracture is mildly impacted. No significant displacement or angulation. Left femoral neck mass with prominent soft tissue component emanating from the posterior cortex in total measures approximately 5.6 x 4.2 x 4.5 cm (series 2, image 30; series 5, image 50). Known lytic lesion of the left ilium is partially visualized at the edge of the field of view (series 1, image 3), more fully characterized on CT abdomen-pelvis 07/29/2019. No additional lytic or sclerotic bony lesions are seen within the field of view. Mild joint space narrowing of the left hip. No dislocation. No evidence of femoral head avascular necrosis. Ligaments Suboptimally assessed by CT. Muscles and Tendons Soft tissue mass results in mass effect on the adjacent left quadratus femoris muscle without obvious muscular invasion, although evaluation is limited with noncontrast CT. Musculotendinous structures appear intact within the limitations of CT. Soft tissues No additional soft tissue masses. No fluid collection. Sigmoid diverticulosis is noted. IMPRESSION: 1. Expansile lytic mass centered at the left femoral neck with comminuted pathologic fracture involving the basicervical and intertrochanteric aspects of the femur. Fracture is mildly impacted. No significant displacement or angulation. 2. Known lytic lesion of the left ilium is partially visualized at the edge of the field of view, more fully characterized on CT abdomen-pelvis 07/29/2019. Electronically Signed   By: Davina Poke D.O.   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 COMPARISON:  CT 07/29/2019 FINDINGS: Bones/Joint/Cartilage Lytic mildly expansile mass centered within the intertrochanteric aspect of the proximal right femur measuring 4.6 x 3.4 x 5.7 cm (series 3, image 33; series 9, image 72). There is significant cortical breakthrough of the posterior and superior cortex of the femoral neck (series 3, image 32; series 7, image 66). Mass occupies the full diameter of the inferior femoral neck. Small extraosseous soft tissue component along the posterior femoral neck is suggested with mild mass effect on the traversing quadratus femoris muscle. No complete pathologic fracture is evident at this time. Known lytic mass in the posterior right ilium was not included within the field of view. Ill-defined area of lucency within the central aspect of the distal femoral metaphysis measuring 1.7 x 1.1 x 1.1 cm (series 3, image 155; series 7, image 62) is nonspecific and may reflect a area of prominent marrow fat. A small lytic lesion not excluded. No additional areas of lytic or sclerotic bone lesion is identified. Ligaments Suboptimally assessed by CT. Muscles and Tendons No acute myotendinous abnormality. Soft tissues No soft tissue fluid collection. IMPRESSION: 1. Lytic mildly expansile mass centered within the intertrochanteric aspect of the proximal right femur measuring up to 5.7 cm with significant cortical breakthrough of the posterior and superior cortex of the femoral neck. Small extraosseous soft tissue component along the posterior femoral neck is  suggested with mild mass effect on the traversing quadratus femoris muscle. This lesion is at high risk of pathologic fracture.  2. Ill-defined 1.7 cm area of lucency within the central aspect of the distal femoral metaphysis is nonspecific and may reflect a area of prominent marrow fat. A small lytic lesion not excluded. MRI could be performed for confirmation, as clinically indicated. Electronically Signed   By: Davina Poke D.O.   On: 07/30/2019 08:36   DG C-Arm 1-60 Min  Result Date: 08/02/2019 CLINICAL DATA:  Right intramedullary femoral IM nail for impending femoral fracture. EXAM: RIGHT FEMUR 2 VIEWS; DG C-ARM 1-60 MIN COMPARISON:  Preoperative imaging. FINDINGS: Six fluoroscopic spot views obtained in the operating room in frontal and lateral projections. Placement of intramedullary nail with trans trochanteric and distal locking screws. Known osseous metastatic disease is not well visualized on fluoroscopic spot views. Fluoroscopy time 1 minutes 43 seconds. Dose 7.30 mGy. IMPRESSION: Fluoroscopic spot views during right femoral intramedullary nail placement. Electronically Signed   By: Keith Rake M.D.   On: 08/02/2019 15:30   DG HIP OPERATIVE UNILAT W OR W/O PELVIS LEFT  Result Date: 07/30/2019 CLINICAL DATA:  Left hemiarthroplasty. EXAM: OPERATIVE LEFT HIP (WITH PELVIS IF PERFORMED) TECHNIQUE: Fluoroscopic spot image(s) were submitted for interpretation post-operatively. COMPARISON:  Radiograph yesterday. FINDINGS: Three spot images obtained in the operating room during left hip arthroplasty. Fluoroscopy time not provided. IMPRESSION: Three spot images obtained in the operating room during left hip arthroplasty. Electronically Signed   By: Keith Rake M.D.   On: 07/30/2019 18:25   DG HIP UNILAT WITH PELVIS 2-3 VIEWS LEFT  Result Date: 07/29/2019 CLINICAL DATA:  Recent falls EXAM: DG HIP (WITH OR WITHOUT PELVIS) 2-3V LEFT COMPARISON:  None. FINDINGS: Frontal pelvis as well as frontal and lateral left hip images were obtained. There is a comminuted fracture of the basicervical and intertrochanteric regions  the proximal left femur. There is slight displacement of fracture fragments at the basicervical femoral neck region. There is inferior avulsion of the lesser trochanter. No other fractures are evident. No dislocation. There is mild symmetric narrowing of each hip joint. Postoperative change noted at L4, L5, S1. A stimulator device is present over the right iliac crest. There are surgical clips in the right inguinal region. IMPRESSION: Comminuted fracture of the proximal left femur with basicervical intertrochanteric components. Avulsion lesser trochanter. Mild displacement of fracture fragments along the lateral portion of the basicervical portion of the fracture. No other fractures. No dislocation. Mild symmetric narrowing each hip joint. Electronically Signed   By: Lowella Grip III M.D.   On: 07/29/2019 13:17   DG FEMUR MIN 2 VIEWS LEFT  Result Date: 07/29/2019 CLINICAL DATA:  Proximal femur fracture following fall EXAM: LEFT FEMUR 2 VIEWS COMPARISON:  Pelvis and left hip radiographs July 29, 2019 FINDINGS: Frontal and lateral views were obtained. There is a comminuted basicervical and intertrochanteric portions of the proximal left femur. No inferior displacement of the lesser trochanter. No more distal fractures are evident. No dislocation. There is mild narrowing of the left hip joint. The knee joint region appears normal. No joint effusion. Foci of arterial vascular calcification noted. IMPRESSION: Comminuted fracture proximal left femur. Note that there is avulsion with inferior displacement of the lesser trochanter on the left. No new more distal fractures are evident. No dislocation. Mild narrowing left hip joint. Foci of arterial vascular calcification noted. Electronically Signed   By: Lowella Grip III M.D.   On: 07/29/2019 13:15  DG FEMUR, MIN 2 VIEWS RIGHT  Result Date: 08/02/2019 CLINICAL DATA:  Right intramedullary femoral IM nail for impending femoral fracture. EXAM: RIGHT FEMUR 2  VIEWS; DG C-ARM 1-60 MIN COMPARISON:  Preoperative imaging. FINDINGS: Six fluoroscopic spot views obtained in the operating room in frontal and lateral projections. Placement of intramedullary nail with trans trochanteric and distal locking screws. Known osseous metastatic disease is not well visualized on fluoroscopic spot views. Fluoroscopy time 1 minutes 43 seconds. Dose 7.30 mGy. IMPRESSION: Fluoroscopic spot views during right femoral intramedullary nail placement. Electronically Signed   By: Keith Rake M.D.   On: 08/02/2019 15:30       IMPRESSION/PLAN: 1. 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  Dr. Lisbeth Renshaw discusses the findings from his recent imaging, and reviews his surgical course as well.  After personally reviewing his films, Dr. Lisbeth Renshaw would recommend palliative radiotherapy to the thoracic spine, pelvis including the sacroiliac joint and sacrum, and bilateral femurs.  He would treat each site as a separate isocenter, totaling 4 separate sites.  Given the recency of his surgery, we would hold off on treating his femurs for another week however he could begin treatment to the sacrum/pelvis region and lower thoracic spine on Monday.  He will proceed with simulation this morning.  Dr. Lisbeth Renshaw anticipates a course of 10 fractions to each of the sites.  We discussed the risks, benefits, short and long-term effects of radiotherapy and the patient and his wife are interested in proceeding.  Written consent is obtained and placed in the chart, a copy was provided to the patient.  I have also placed a referral to inpatient oncology as the patient would like to relocate his care to [facility] and does not have an established medical oncologist. 2. Pain management.  The patient is having entire terrible time with achieving pain control, we discussed the rationale for palliative medicine to help with  symptom relief.  We appreciate their input.    In a visit lasting 90 minutes, greater than 50% of the time was spent face to face discussing the patient's condition, in preparation for the discussion, and coordinating the patient's care.     Carola Rhine, PAC

## 2019-08-05 NOTE — Progress Notes (Signed)
Manufacturing engineer (ACC)  Spoke by phone with patient's spouse Mrs. Cueva who endorses desire for hospice services in home upon discharge.  She shared what a shock this week has been and how challenging the diagnosis has been for the family.  Active listening provided. Mrs. Harr shares that patient is scheduled for palliative radiation beginning on 08/09/19 and that he hopes to d/c from inpt to rehab.  Mrs hartford maulden understanding that hospice services have as their goal comfort care and, as such,  would begin once patient discharges from rehab. ACC contact information and phone numbers provided and plan made for [facility] liaisons to follow during hospitalization.  Mrs. Springer denied new questions or concerns at this time.  Thank you for the opportunity to participate in this patient's care.  Domenic Moras, BSN, RN [facility] Liaison [phone]  [phone] (24h on call)

## 2019-08-05 NOTE — Progress Notes (Signed)
PROGRESS NOTE    Greg Hughes  VFI:[id number] DOB: 1941-01-30 DOA: 07/28/2019 PCP: Tonia Ghent, MD    Chief Complaint  Patient presents with   Hip Pain    Brief Narrative:   79 year old male with CAD, BPH, OSA, paroxysmal A. fib on Xarelto, recent hospitalization at [facility] in January 2021 for a lung nodule status post resection (I am unable to find pathology reports), came to the hospital with severe low back pain, hip pain and inability to walk. Several weeks ago while he was mowing the lawn felt like his hip was popping out of socket, followed by progressively worsening pain. He was seen in the ED on midnight and x-ray was unremarkable. Pain continued and came back to the hospital. CT scan now concern for multiple metastatic osteolytic lesions and a pathological fracture to the left hip.  He is now status post left hip hemiarthroplasty as well as prophylactic IM nailing of the right femur. Palliative care consulted, and on further discussions , transition to DNR and hospice referral.     Assessment & Plan:   Principal Problem:   Intractable low back pain Active Problems:   Mixed hyperlipidemia   Essential hypertension   COPD (chronic obstructive pulmonary disease) (HCC)   AF (paroxysmal atrial fibrillation) (HCC)   Gout   Benign prostatic hyperplasia with lower urinary tract symptoms   Chronic diastolic CHF (congestive heart failure) (HCC)   Coronary artery disease involving native coronary artery of native heart   Pathological fracture in neoplastic disease, hip, unspecified, initial encounter for fracture [facility])   Palliative care by specialist   Goals of care, counseling/discussion   DNR (do not resuscitate)  Left femoral neck pathological fracture probably secondary to the lytic lesion involving the left femoral neck with associated pathological fracture, additional lytic lesion in the left iliac bone and left sacrum concerning for metastatic disease. Orthopedics  consulted and he underwent left hip hemiarthroplasty, posterior approach and open biopsy of the left femoral bone lesion on 07/30/2019.  CT scan showed extensive metastatic disease in the right femur, and he underwent prophylactic intramedullary fixation of the right femur. PT evaluation recommending SNF at this time.    Metastatic disease of unknown primary Patient had a history of lung nodule s/p resection. Imaging of the chest abdomen and pelvis shows extensive metastatic disease to the bone, liver and there is 6 mm pulmonary nodule in the right lung base. Results from the biopsy are pending. Oncology consulted and recommended outpatient follow up.  Meanwhile radiation oncology consulted for radiation treatment. Pain control .  In view of multiple medical problems, extensive metastatic disease, deconditioning, palliative care consulted, and he was transitioned to DNR, and hospice referral placed.    Essential hypertension Well controlled. No changes in medications.    COPD Stable.    Paroxysmal atrial fibrillation rate controlled on metoprolol. Continue with home medications. Continue with xarelto for anti coagulation.     Coronary artery disease Continue with statin and beta-blocker. No chest pain or sob.    Hyperlipidemia Continue with statin.   Constipation Continue with MiraLAX.   Historically, patient is also status post C5-C7 ACDF and L3-L5 posterior spinal and interbody  fusion and spinal cord simulator   Bph: Continue with flomax.   Hypokalemia Replaced.     DVT prophylaxis:  Xarelto.  Code Status: FULL CODE.  Family Communication: wife at bedside.  Disposition:   Status is: Inpatient  Remains inpatient appropriate because:Ongoing diagnostic testing needed not appropriate for  outpatient work up   [employer]: The patient is from: Home              Anticipated d/c is to: SNF              Anticipated d/c date is: 2 days              Patient currently  is not medically stable to d/c.       Consultants:   Orthopedics.    Procedures: s/p IM nailing of the right femur.  Antimicrobials: NONE.   Subjectiv Pain persistent,   Objective: Vitals:   08/04/19 1500 08/04/19 2025 08/05/19 0438 08/05/19 0900  BP: 102/70 131/77 126/88 136/89  Pulse: 88 90 100   Resp:  18 16 17   Temp: 97.8 F (36.6 C) 98.4 F (36.9 C) 98.4 F (36.9 C) 98.4 F (36.9 C)  TempSrc: Oral Oral Oral Oral  SpO2: 95% 97% 98% 98%  Weight:      Height:        Intake/Output Summary (Last 24 hours) at 08/05/2019 1449 Last data filed at 08/05/2019 0600 Gross per 24 hour  Intake 590 ml  Output 450 ml  Net 140 ml   Filed Weights   07/28/19 1735 08/04/19 0910  Weight: 79.4 kg 79.3 kg    Examination:  General exam: alert and mild distress.  Respiratory system: clear to ausculation, no wheezing or rhonchi.  Cardiovascular system: S1s2, RRR, no JVD. No pedal edema.  Gastrointestinal system: Abdomen is soft, non tender non distended, bowel sounds wnl.  Central nervous system: Alert and non focal.  Extremities: no pedal edema. . Skin: No rashes.  Psychiatry:  Mood is appropriate.     Data Reviewed: I have personally reviewed following labs and imaging studies  CBC: Recent Labs  Lab 08/01/19 0755 08/02/19 0146 08/03/19 0209 08/04/19 0810 08/05/19 0335  WBC 14.3* 15.5* 12.3* 17.7* 17.5*  HGB 9.7* 9.0* 7.0* 7.6* 7.5*  HCT 29.1* 27.3* 20.9* 23.2* 23.2*  MCV 91.5 92.5 92.1 92.1 93.2  PLT 223 244 237 370 485    Basic Metabolic Panel: Recent Labs  Lab 07/31/19 0451 08/01/19 0755 08/02/19 0146 08/03/19 0209 08/04/19 0810  NA 139 137 137 139 138  K 3.4* 3.7 3.7 4.1 3.2*  CL 103 103 101 104 102  CO2 26 24 26 24 26   GLUCOSE 126* 132* 104* 145* 98  BUN 21 19 18 21  24*  CREATININE 1.21 1.08 1.06 1.01 1.07  CALCIUM 9.8 9.7 9.8 9.9 9.6  MG  --   --  1.9  --   --     GFR: Estimated Creatinine Clearance: 57.8 mL/min (by C-G formula based on  SCr of 1.07 mg/dL).  Liver Function Tests: Recent Labs  Lab 07/30/19 0416  AST 35  ALT 36  ALKPHOS 121  BILITOT 0.9  PROT 7.3  ALBUMIN 3.4*    CBG: No results for input(s): GLUCAP in the last 168 hours.   Recent Results (from the past 240 hour(s))  SARS Coronavirus 2 by RT PCR (hospital order, performed in [facility] hospital lab) Nasopharyngeal Nasopharyngeal Swab     Status: None   Collection Time: 07/29/19 12:27 AM   Specimen: Nasopharyngeal Swab  Result Value Ref Range Status   SARS Coronavirus 2 NEGATIVE NEGATIVE Final    Comment: (NOTE) SARS-CoV-2 target nucleic acids are NOT DETECTED.  The SARS-CoV-2 RNA is generally detectable in upper and lower respiratory specimens during the acute phase of infection. The lowest concentration  of SARS-CoV-2 viral copies this assay can detect is 250 copies / mL. A negative result does not preclude SARS-CoV-2 infection and should not be used as the sole basis for treatment or other patient management decisions.  A negative result may occur with improper specimen collection / handling, submission of specimen other than nasopharyngeal swab, presence of viral mutation(s) within the areas targeted by this assay, and inadequate number of viral copies (<250 copies / mL). A negative result must be combined with clinical observations, patient history, and epidemiological information.  Fact Sheet for Patients:   StrictlyIdeas.no  Fact Sheet for Healthcare Providers: BankingDealers.co.za  This test is not yet approved or  cleared by the [place] FDA and has been authorized for detection and/or diagnosis of SARS-CoV-2 by FDA under an Emergency Use Authorization (EUA).  This EUA will remain in effect (meaning this test can be used) for the duration of the COVID-19 declaration under Section 564(b)(1) of the Act, 21 U.S.C. section 360bbb-3(b)(1), unless the authorization is terminated  or revoked sooner.  Performed at [facility], [place] [address], [place], [place] [address]   Surgical pcr screen     Status: None   Collection Time: 07/30/19 10:32 AM   Specimen: Nasal Mucosa; Nasal Swab  Result Value Ref Range Status   MRSA, PCR NEGATIVE NEGATIVE Final   Staphylococcus aureus NEGATIVE NEGATIVE Final    Comment: (NOTE) The Xpert SA Assay (FDA approved for NASAL specimens in patients 109 years of age and older), is one component of a comprehensive surveillance program. It is not intended to diagnose infection nor to guide or monitor treatment. Performed at [facility], [place] [address], [place], [place] [address]          Radiology Studies: No results found.      Scheduled Meds:  acetaminophen  1,000 mg Oral TID   allopurinol  300 mg Oral Daily   calcitonin (salmon)  1 spray Alternating Nares Daily   Chlorhexidine Gluconate Cloth  6 each Topical Daily   dexamethasone  4 mg Oral BID   diltiazem  180 mg Oral Daily   docusate sodium  100 mg Oral BID   enoxaparin (LOVENOX) injection  40 mg Subcutaneous Q24H   finasteride  5 mg Oral Daily   metoprolol succinate  50 mg Oral Daily   pantoprazole  40 mg Oral Daily   polyethylene glycol  17 g Oral Daily   pregabalin  25 mg Oral BID   senna  1 tablet Oral BID   simvastatin  10 mg Oral q1800   sodium phosphate  1 enema Rectal Once   tamsulosin  0.8 mg Oral Daily   Continuous Infusions:  lactated ringers 10 mL/hr at 08/02/19 1132     LOS: 7 days       Hosie Poisson, MD [facility]   To contact the attending provider between 7A-7P or the covering provider during after hours 7P-7A, please log into the web site [email or web address] and access using universal [place] password for that web site. If you do not have the password, please call the hospital operator.  08/05/2019, 2:49 PM

## 2019-08-05 NOTE — Progress Notes (Addendum)
ANTICOAGULATION CONSULT NOTE - Initial Consult  Pharmacy Consult for Xarelto Indication: atrial fibrillation  No Known Allergies  Patient Measurements: Height: 5\' 10"  (177.8 cm) Weight: 79.3 kg (174 lb 13.2 oz) IBW/kg (Calculated) : 73  Vital Signs: Temp: 98.4 F (36.9 C) (06/17 0900) Temp Source: Oral (06/17 0900) BP: 103/68 (06/17 1012) Pulse Rate: 100 (06/17 1012)  Labs: Recent Labs    08/03/19 0209 08/03/19 0209 08/04/19 0810 08/05/19 0335  HGB 7.0*   < > 7.6* 7.5*  HCT 20.9*  --  23.2* 23.2*  PLT 237  --  370 387  CREATININE 1.01  --  1.07  --    < > = values in this interval not displayed.    Estimated Creatinine Clearance: 57.8 mL/min (by C-G formula based on SCr of 1.07 mg/dL).   Medical History: Past Medical History:  Diagnosis Date  . A-fib ([place]) 2013  . Adenomatous colon polyp 04/2008  . BPH (benign prostatic hypertrophy)   . CAD (coronary artery disease)   . 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 Complication of anesthesia    " I fainted a couple of times when they went to get me up."  . Early cataract   . ED (erectile dysfunction)   . GERD (gastroesophageal reflux disease)   . Hernia, abdominal    small  . History of colon polyps   . Hyperlipidemia   . Hypertension   . Lumbar herniated disc   . Lung nodule   . Microscopic hematuria   . Right arm weakness 04/17/2015  . Spinal stenosis   . Tobacco abuse     Assessment: 79 yr old male with admitted on 07/28/19 with metastatic cancer of lung (unknown primary) with hip pain and found to have L femoral neck pathological fx probably secondary to lytic lesion (S/P ortho surgery on 6/14). Pt was on Xarelto PTA for atrial fibrillation. During this hospitalization, pt has been receiving Lovenox 40 mg SQ daily (last dose given at [place] AM today). Pharmacy is consulted to restart pt's home Xarelto.  H/H 7.5/23.2, platelets 387 (CBC stable), Scr 1.07, TBW CrCl ~63 ml/min (renal  function stable); INR on 7/14 was 1.2  Goal of Therapy:  Prevention of stroke secondary to atrial fibrillation Monitor platelets by anticoagulation protocol: Yes   Plan:  Xarelto 20 mg po daily with evening meal, starting this evening Monitor CBC Monitor for signs/symptoms of bleeding  Gillermina Hu, PharmD, BCPS, [facility] Clinical Pharmacist 08/05/2019,3:01 PM

## 2019-08-05 NOTE — Plan of Care (Signed)

## 2019-08-05 NOTE — Progress Notes (Signed)
[facility])  Referral received from [facility] for hospice services at home once discharged.  Reached out to son and wife, waiting to hear back to help with d/c planning.  Per [facility] manager, will need hospital bed and 3n1.  [facility] will continue to follow and assist with d/c planning.  Venia Carbon RN, BSN, [facility] Liaison

## 2019-08-05 NOTE — Progress Notes (Signed)
Spoke with patient nurse Tu to let her know that he is scheduled for a CT simulation today at 1000.  I let her know to call [facility] to have the patient here at 0945.  She verbalized understanding.   Spoke with Marcello Moores at [facility] to let him know that he would get a request from [facility] to bring a patient to the [place] at [place].    Will continue to follow as necessary.  Gloriajean Dell. Leonie Green, BSN

## 2019-08-05 NOTE — Consult Note (Addendum)
[place]  [phone] [phone]   MEDICAL ONCOLOGY - INITIAL CONSULTATION  Referral MD: Dr. Hosie Poisson  Reason for Referral: Metastatic lung cancer  HPI: 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  He presented to the emergency room with complaints of severe low back pain, hip pain, and inability to walk.  The patient was mowing the lawn in May 2021 and felt a sensation of his hip "popping out of socket."  The patient thereafter felt severe low back pain and bilateral hip pain.  He was evaluated in the emergency room with x-rays which showed no acute findings.  Since that time, the patient's pain has worsened.  He was seen by an orthopedic provider at [facility] on 07/09/2019.  No intervention or change in therapy was made and referral was made for the patient to follow-up with a local pain clinic.  The patient's weakness progressed to the point that he had to use a wheelchair to get around.  CT of the chest, abdomen, pelvis on admission showed multiple osseous metastatic disease with pathologic fracture of the left femoral neck, lesions in the liver consistent with metastatic disease, thickening of the esophagus near the GE junction likely a hiatal hernia, nonspecific 6 mm pulmonary nodule in the right lung base, pathologic fracture of the left femoral neck with soft tissue mass measuring 5.5 x 3.8 cm, evidence of prior left lower lobe wedge resection.  Patient was seen by orthopedics and underwent left hip hemiarthroplasty on 07/30/2019.  Pathology showed metastatic high-grade carcinoma favored to be consistent with metastasis from the patient's known primary lung carcinoma. He underwent  intramedullary rod placement on the right on 08/02/2019. Radiation oncology has been consulted for consideration of palliative radiation.  When seen today, patient's wife is at the bedside.  He remains very weak and tired.  Still unable to ambulate.  Reports that he is having significant pain in his back and pelvic area.  Pain is currently 7/10 reports that it is not any better than when he came in.  He was seen by palliative care earlier today with changes made to his pain medications.  The patient's wife reports that the pain medication only last for very brief period of time.  He reports a poor appetite and estimates that he has lost anywhere from 20 to 30 pounds recently.  He denies headaches and dizziness.  Reports some vision changes but he attributes this to cataracts.  Denies chest pain or shortness of breath.  Denies abdominal pain, nausea, vomiting.  Reports constipation.  No bleeding reported.  The patient is married.  He lives in [place], [place].  He has 5 children.  Denies alcohol use.  He currently smokes 1 pack of cigarettes per day x60 years.  He is retired but previously worked on Doctor, general practice.  Medical oncology was asked see the patient to make recommendations regarding his newly diagnosed metastatic lung cancer.   Past Medical History:  Diagnosis Date  . A-fib ([place]) 2013  . Adenomatous colon polyp 04/2008  . BPH (benign prostatic hypertrophy)   . CAD (coronary artery disease)   . 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 Complication of anesthesia    " I  fainted a couple of times when they went to get me up."  . Early cataract   . ED (erectile dysfunction)   . GERD (gastroesophageal reflux disease)   . Hernia, abdominal    small  . History of colon polyps   . Hyperlipidemia   . Hypertension   . Lumbar herniated disc   . Lung nodule   . Microscopic hematuria   . Right arm weakness 04/17/2015  . Spinal stenosis   . Tobacco abuse   :  Past  Surgical History:  Procedure Laterality Date  . ANTERIOR CERVICAL DECOMP/DISCECTOMY FUSION N/A 06/16/2014   Procedure: ANTERIOR CERVICAL DECOMPRESSION/DISCECTOMY FUSION CERVICAL FIVE-SIX,CERVICAL SIX-SEVEN;  Surgeon: Eustace Moore, MD;  Location: [place] [facility];  Service: Neurosurgery;  Laterality: N/A;  . BACK SURGERY    . COLONOSCOPY  2002  . FEMUR IM NAIL Right 08/02/2019   Procedure: INTRAMEDULLARY (IM) NAIL FEMORAL;  Surgeon: Rod Can, MD;  Location: [facility];  Service: Orthopedics;  Laterality: Right;  . HIP ARTHROPLASTY Left 07/30/2019   Procedure: LEFT HEMIARTHROPLASTY HIP;  Surgeon: Rod Can, MD;  Location: [place];  Service: Orthopedics;  Laterality: Left;  . INGUINAL HERNIA REPAIR    . SPINAL CORD STIMULATOR IMPLANT  2020   at [facility] clinic  . THOROCOTOMY WITH LOBECTOMY Left 2021  . TONSILLECTOMY    :  Current Facility-Administered Medications  Medication Dose Route Frequency Provider Last Rate Last Admin  . acetaminophen (TYLENOL) tablet 1,000 mg  1,000 mg Oral TID Rosezella Rumpf, NP   1,000 mg at 08/05/19 1156  . allopurinol (ZYLOPRIM) tablet 300 mg  300 mg Oral Daily Rod Can, MD   300 mg at 08/05/19 0816  . alum & mag hydroxide-simeth (MAALOX/MYLANTA) 200-200-20 MG/5ML suspension 15 mL  15 mL Oral Q6H PRN Rod Can, MD   15 mL at 08/01/19 1220  . calcitonin (salmon) (MIACALCIN/FORTICAL) nasal spray 1 spray  1 spray Alternating Nares Daily Rosezella Rumpf, NP   1 spray at 08/05/19 1200  . Chlorhexidine Gluconate Cloth 2 % PADS 6 each  6 each Topical Daily Rod Can, MD   6 each at 08/05/19 1200  . dexamethasone (DECADRON) tablet 4 mg  4 mg Oral BID Rosezella Rumpf, NP   4 mg at 08/05/19 1157  . diltiazem (CARDIZEM CD) 24 hr capsule 180 mg  180 mg Oral Daily Rod Can, MD   180 mg at 08/05/19 0814  . docusate sodium (COLACE) capsule 100 mg  100 mg Oral BID Rod Can, MD   100 mg at 08/05/19 0814  . enoxaparin (LOVENOX) injection  40 mg  40 mg Subcutaneous Q24H Rod Can, MD   40 mg at 08/05/19 0813  . finasteride (PROSCAR) tablet 5 mg  5 mg Oral Daily Rod Can, MD   5 mg at 08/05/19 0814  . HYDROmorphone (DILAUDID) injection 0.5 mg  0.5 mg Intravenous Q3H PRN Rosezella Rumpf, NP       Or  . HYDROmorphone (DILAUDID) injection 1 mg  1 mg Intravenous Q3H PRN Rosezella Rumpf, NP   1 mg at 08/05/19 1205  . HYDROmorphone (DILAUDID) tablet 2 mg  2 mg Oral Q3H PRN Rod Can, MD   2 mg at 08/04/19 2137  . lactated ringers infusion   Intravenous Continuous Rod Can, MD 10 mL/hr at 08/02/19 1132 Restarted at 08/02/19 1255  . menthol-cetylpyridinium (CEPACOL) lozenge 3 mg  1 lozenge Oral PRN Rod Can, MD       Or  .  phenol (CHLORASEPTIC) mouth spray 1 spray  1 spray Mouth/Throat PRN Swinteck, Aaron Edelman, MD      . metoCLOPramide (REGLAN) tablet 5-10 mg  5-10 mg Oral Q8H PRN Swinteck, Aaron Edelman, MD       Or  . metoCLOPramide (REGLAN) injection 5-10 mg  5-10 mg Intravenous Q8H PRN Swinteck, Aaron Edelman, MD      . metoprolol succinate (TOPROL-XL) 24 hr tablet 50 mg  50 mg Oral Daily Rod Can, MD   50 mg at 08/05/19 0814  . ondansetron (ZOFRAN) tablet 4 mg  4 mg Oral Q6H PRN Swinteck, Aaron Edelman, MD       Or  . ondansetron (ZOFRAN) injection 4 mg  4 mg Intravenous Q6H PRN Swinteck, Aaron Edelman, MD      . pantoprazole (PROTONIX) EC tablet 40 mg  40 mg Oral Daily Rosezella Rumpf, NP   40 mg at 08/05/19 1158  . polyethylene glycol (MIRALAX / GLYCOLAX) packet 17 g  17 g Oral Daily Rod Can, MD   17 g at 08/05/19 1914  . pregabalin (LYRICA) capsule 25 mg  25 mg Oral BID Rod Can, MD   25 mg at 08/05/19 0815  . senna (SENOKOT) tablet 8.6 mg  1 tablet Oral BID Rosezella Rumpf, NP   8.6 mg at 08/05/19 1157  . simvastatin (ZOCOR) tablet 10 mg  10 mg Oral q1800 Rod Can, MD   10 mg at 08/04/19 1757  . tamsulosin (FLOMAX) capsule 0.8 mg  0.8 mg Oral Daily Swinteck, Aaron Edelman, MD   0.8 mg at  08/05/19 0815     No Known Allergies:  Family History  Problem Relation Age of Onset  . Diabetes Father   . Coronary artery disease Father   . Stroke Father   . Arthritis Mother   . Hiatal hernia Mother   . Pulmonary embolism Mother   . Colon cancer Neg Hx   . Prostate cancer Neg Hx   :  Social History   Socioeconomic History  . Marital status: Married    Spouse name: Not on file  . Number of children: Not on file  . Years of education: Not on file  . Highest education level: Not on file  Occupational History  . Not on file  Tobacco Use  . Smoking status: Current Every Day Smoker    Packs/day: 1.00    Years: 60.00    Pack years: 60.00    Types: Cigarettes  . Smokeless tobacco: Never Used  Vaping Use  . Vaping Use: Former  Substance and Sexual Activity  . Alcohol use: No  . Drug use: No  . Sexual activity: Not on file  Other Topics Concern  . Not on file  Social History Narrative   Lives in [place].   Retired from [employer]),  Development worker, international aid   Active with [employer], [place] [employer] [place]      Married (1963, wife Greg Hughes) with 5 children    Current Smoker: about 1 pack per day, transition to e-cig   Alcohol use-no    Caffeine use/day:  2 beverages daily   Diet : regular      Moved to [place] in 2005   Social Determinants of Health   Financial Resource Strain:   . Difficulty of Paying Living Expenses:   Food Insecurity:   . Worried About Charity fundraiser in the Last Year:   . Arboriculturist in the Last Year:   Transportation Needs:   .  Lack of Transportation (Medical):   Marland Kitchen Lack of Transportation (Non-Medical):   Physical Activity:   . Days of Exercise per Week:   . Minutes of Exercise per Session:   Stress:   . Feeling of Stress :   Social Connections:   . Frequency of Communication with Friends and Family:   . Frequency of Social Gatherings with Friends and Family:   . Attends Religious Services:   .  Active Member of Clubs or Organizations:   . Attends Archivist Meetings:   Marland Kitchen Marital Status:   Intimate Partner Violence:   . Fear of Current or Ex-Partner:   . Emotionally Abused:   Marland Kitchen Physically Abused:   . Sexually Abused:   :  Review of Systems: A comprehensive 14 point review of systems was negative except as noted in the HPI.  Exam: Patient Vitals for the past 24 hrs:  BP Temp Temp src Pulse Resp SpO2  08/05/19 0900 136/89 98.4 F (36.9 C) Oral -- 17 98 %  08/05/19 0438 126/88 98.4 F (36.9 C) Oral 100 16 98 %  08/04/19 2025 131/77 98.4 F (36.9 C) Oral 90 18 97 %  08/04/19 1500 102/70 97.8 F (36.6 C) Oral 88 -- 95 %    General: Chronically ill-appearing male, appears fatigued Eyes:  no scleral icterus.   ENT:  There were no oropharyngeal lesions.   Neck was without thyromegaly.   Lymphatics:  Negative cervical, supraclavicular or axillary adenopathy.   Respiratory: Diminished breath sounds.   Cardiovascular: Tachycardic, no lower extremity edema GI: Positive bowel sounds, soft, nontender Musculoskeletal: Diminished strength in his bilateral lower extremities   Skin: Multiple ecchymoses over his bilateral arms secondary to IV sticks and lab draws   Neuro exam was nonfocal. Patient was alert and oriented.  Attention was good.   Language was appropriate.  Mood was normal without depression.  Speech was not pressured.  Thought content was not tangential.     Lab Results  Component Value Date   WBC 17.5 (H) 08/05/2019   HGB 7.5 (L) 08/05/2019   HCT 23.2 (L) 08/05/2019   PLT 387 08/05/2019   GLUCOSE 98 08/04/2019   CHOL 142 10/05/2018   TRIG 214.0 (H) 10/05/2018   HDL 41.80 10/05/2018   LDLDIRECT 71.0 10/05/2018   LDLCALC 79 09/26/2017   ALT 36 07/30/2019   AST 35 07/30/2019   NA 138 08/04/2019   K 3.2 (L) 08/04/2019   CL 102 08/04/2019   CREATININE 1.07 08/04/2019   BUN 24 (H) 08/04/2019   CO2 26 08/04/2019    CT CHEST W CONTRAST  Result  Date: 07/29/2019 CLINICAL DATA:  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 CONTRAST:  179m OMNIPAQUE IOHEXOL 300 MG/ML  SOLN COMPARISON:  CT left hip, 07/29/2019, MR lumbar spine, 07/28/2019 FINDINGS: CT CHEST FINDINGS Cardiovascular: Aortic atherosclerosis. Normal heart size. Three-vessel coronary artery calcifications and stents. No pericardial effusion. Mediastinum/Nodes: No enlarged mediastinal, hilar, or axillary lymph nodes. There is thickening of the esophagus near the gastroesophageal junction, likely a small hiatal hernia (series 5, image 73). Thyroid gland and trachea demonstrate no significant findings. Lungs/Pleura: Mild centrilobular emphysema. Trace bilateral pleural effusions and bandlike scarring or atelectasis of the bilateral lung bases. Evidence of prior left lower lobe wedge resection. Nonspecific  6 mm pulmonary nodule of the right lung base, new compared to remote prior CT dated 05/30/2006 (series 4, image 108). Musculoskeletal: Lytic lesion involving the spine of the right scapula measuring 2.6 x 1.9 cm (series 3, image 1). Subtle lytic lesions involving the left manubrium measuring 1.4 x 1.3 cm (series 3, image 11) and the inner table of the inferior manubrium measuring 1.9 x 1.4 cm (series 3, image 16). There is a lytic osseous lesion involving the left aspect of the T9 and T10 vertebral bodies with and extraosseous soft tissue component, measuring 3.9 x 3.6 cm at the level of T10 (series 3, image 43). CT ABDOMEN PELVIS FINDINGS Hepatobiliary: There are multiple low-attenuation lesions of the liver, the majority of which appear to be simple cysts and stable compared to remote prior examination dated 05/30/2006, however there  are multiple additional, ill-defined hypodense lesions, for example of the liver dome measuring 1.7 x 1.1 cm (series 3, image 46) and of the anterior right lobe of the liver measuring 1.6 x 1.5 cm (series 3, image 63). No gallstones, gallbladder wall thickening, or biliary dilatation. Pancreas: Unremarkable. No pancreatic ductal dilatation or surrounding inflammatory changes. Spleen: Normal in size without significant abnormality. Adrenals/Urinary Tract: Right adrenal nodule measuring 2.2 x 2.1 cm, enlarged compared to prior examination dated 05/30/2006 although low-attenuation, HU = 9, and very likely a benign adenoma (series 3, image 55). There are bilateral simple cysts of the kidneys. 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 Bladder is unremarkable. Stomach/Bowel: Stomach is within normal limits. Appendix appears normal. No evidence of bowel wall thickening, distention, or inflammatory changes. Sigmoid diverticulosis. Moderate burden of stool balls throughout the colon. Vascular/Lymphatic: Severe aortic atherosclerosis. No enlarged abdominal or pelvic lymph nodes. Reproductive: No mass or other abnormality. Other: No abdominal wall hernia or abnormality. Evidence of prior right inguinal hernia repair. No abdominopelvic ascites. Musculoskeletal: Lytic lesion of the right aspect of the ilium abutting the sacroiliac joint measuring 3.0 x 1.9 cm (series 3, image 101). Previously described lytic lesions involving the left hemi sacrum and adjacent ilium, measuring 5.6 x 4.7 cm (series 3, image 100) and with a pathologic fracture of the left femoral neck, soft tissue mass measuring 5.5 x 3.8 cm (series 3, image 12). Lytic lesion of the right femoral neck with erosion of the posterior cortex measuring 4.0 x 3.5 cm (series 3, image 125). IMPRESSION: 1. Multifocal osseous metastatic disease as detailed above, including a previously identified pathologic fracture of the left femoral neck. There are multiple  additional lesions of mechanical significance at risk for pathologic fracture, particularly of the right femoral neck and T9 and T10 vertebral bodies. 2. Ill-defined low-attenuation lesions of the liver consistent with hepatic metastatic disease. 3. There is no candidate primary lesion confidently identified in the chest, abdomen, or pelvis. 4. Thickening of the esophagus near the gastroesophageal junction, likely a small hiatal hernia, esophageal malignancy less favored and without secondary findings such as adjacent lymphadenopathy. 5. Nonspecific 6 mm pulmonary nodule of the right lung base, new compared to remote prior CT dated 05/30/2006 although generally an unlikely solitary manifestation of pulmonary metastatic disease. Attention on follow-up. 6. Right adrenal nodule, which is enlarged compared to prior examination dated 05/30/2006 although low-attenuation and very likely a benign adenoma. Attention on follow-up. 7. There is a pathologic fracture of the left femoral neck with soft tissue mass measuring 5.5 x 3.8 cm. 8. Trace bilateral pleural effusions without obvious nodularity or other manifestations of pleural metastatic disease.  9. Evidence of prior left lower lobe wedge resection. 10. Other chronic, incidental, and postoperative findings as detailed above. Coronary artery disease. Emphysema (ICD10-J43.9). Aortic Atherosclerosis (ICD10-I70.0). Electronically Signed   By: Eddie Candle M.D.   On: 07/29/2019 08:29   MR LUMBAR SPINE WO CONTRAST  Result Date: 07/28/2019 CLINICAL DATA:  Back pain with left lower extremity weakness EXAM: MRI LUMBAR SPINE WITHOUT CONTRAST TECHNIQUE: Multiplanar, multisequence MR imaging of the lumbar spine was performed. No intravenous contrast was administered. COMPARISON:  04/24/2017 FINDINGS: Some sequences had to be omitted due to SAR restrictions related to the patient's spinal stimulator. Segmentation: Standard. Alignment:  Physiologic. Vertebrae:  L3-5 PLIF.  No acute  abnormality. Conus medullaris and cauda equina: Conus extends to the L1 level. Conus and cauda equina appear normal. Paraspinal and other soft tissues: Negative Disc levels: L1-L2: Normal disc space and facet joints. There is no spinal canal stenosis. No neural foraminal stenosis. L2-L3: Mild disc bulge. Endplate spurring. There is no spinal canal stenosis. Mild bilateral neural foraminal stenosis. L3-L4: PLIF. There is no spinal canal stenosis. Limited visualization of the neural foramina due to susceptibility effects from spinal hardware. Within that limitation, no visible neural foraminal stenosis. L4-L5: PLIF. There is no spinal canal stenosis. Limited visualization of the neural foramina due to susceptibility effects from spinal hardware. Within that limitation, no visible neural foraminal stenosis. L5-S1: Normal disc space and facet joints. There is no spinal canal stenosis. No neural foraminal stenosis. Visualized sacrum: Normal. IMPRESSION: 1. Scan time had to be limited due to SAR restrictions imposed by the patient's spinal stimulator device. This caused the examination to be truncated and of reduced image quality. 2. L3-5 PLIF without spinal canal stenosis. No visible foraminal stenosis within limitations caused by adjacent hardware. 3. Mild bilateral L2-3 neural foraminal stenosis. Electronically Signed   By: Ulyses Jarred M.D.   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 CONTRAST:  173m OMNIPAQUE IOHEXOL 300 MG/ML  SOLN COMPARISON:  CT left hip, 07/29/2019, MR lumbar spine, 07/28/2019 FINDINGS: CT CHEST FINDINGS Cardiovascular: Aortic  atherosclerosis. Normal heart size. Three-vessel coronary artery calcifications and stents. No pericardial effusion. Mediastinum/Nodes: No enlarged mediastinal, hilar, or axillary lymph nodes. There is thickening of the esophagus near the gastroesophageal junction, likely a small hiatal hernia (series 5, image 73). Thyroid gland and trachea demonstrate no significant findings. Lungs/Pleura: Mild centrilobular emphysema. Trace bilateral pleural effusions and bandlike scarring or atelectasis of the bilateral lung bases. Evidence of prior left lower lobe wedge resection. Nonspecific 6 mm pulmonary nodule of the right lung base, new compared to remote prior CT dated 05/30/2006 (series 4, image 108). Musculoskeletal: Lytic lesion involving the spine of the right scapula measuring 2.6 x 1.9 cm (series 3, image 1). Subtle lytic lesions involving the left manubrium measuring 1.4 x 1.3 cm (series 3, image 11) and the inner table of the inferior manubrium measuring 1.9 x 1.4 cm (series 3, image 16). There is a lytic osseous lesion involving the left aspect of the T9 and T10 vertebral bodies with and extraosseous soft tissue component, measuring 3.9 x 3.6 cm at the level of T10 (series 3, image 43). CT ABDOMEN PELVIS FINDINGS Hepatobiliary: There  are multiple low-attenuation lesions of the liver, the majority of which appear to be simple cysts and stable compared to remote prior examination dated 05/30/2006, however there are multiple additional, ill-defined hypodense lesions, for example of the liver dome measuring 1.7 x 1.1 cm (series 3, image 46) and of the anterior right lobe of the liver measuring 1.6 x 1.5 cm (series 3, image 63). No gallstones, gallbladder wall thickening, or biliary dilatation. Pancreas: Unremarkable. No pancreatic ductal dilatation or surrounding inflammatory changes. Spleen: Normal in size without significant abnormality. Adrenals/Urinary Tract: Right adrenal nodule measuring 2.2 x 2.1 cm, enlarged  compared to prior examination dated 05/30/2006 although low-attenuation, HU = 9, and very likely a benign adenoma (series 3, image 55). There are bilateral simple cysts of the kidneys. 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 Bladder is unremarkable. Stomach/Bowel: Stomach is within normal limits. Appendix appears normal. No evidence of bowel wall thickening, distention, or inflammatory changes. Sigmoid diverticulosis. Moderate burden of stool balls throughout the colon. Vascular/Lymphatic: Severe aortic atherosclerosis. No enlarged abdominal or pelvic lymph nodes. Reproductive: No mass or other abnormality. Other: No abdominal wall hernia or abnormality. Evidence of prior right inguinal hernia repair. No abdominopelvic ascites. Musculoskeletal: Lytic lesion of the right aspect of the ilium abutting the sacroiliac joint measuring 3.0 x 1.9 cm (series 3, image 101). Previously described lytic lesions involving the left hemi sacrum and adjacent ilium, measuring 5.6 x 4.7 cm (series 3, image 100) and with a pathologic fracture of the left femoral neck, soft tissue mass measuring 5.5 x 3.8 cm (series 3, image 12). Lytic lesion of the right femoral neck with erosion of the posterior cortex measuring 4.0 x 3.5 cm (series 3, image 125). IMPRESSION: 1. Multifocal osseous metastatic disease as detailed above, including a previously identified pathologic fracture of the left femoral neck. There are multiple additional lesions of mechanical significance at risk for pathologic fracture, particularly of the right femoral neck and T9 and T10 vertebral bodies. 2. Ill-defined low-attenuation lesions of the liver consistent with hepatic metastatic disease. 3. There is no candidate primary lesion confidently identified in the chest, abdomen, or pelvis. 4. Thickening of the esophagus near the gastroesophageal junction, likely a small hiatal hernia, esophageal malignancy less favored and without secondary findings such as  adjacent lymphadenopathy. 5. Nonspecific 6 mm pulmonary nodule of the right lung base, new compared to remote prior CT dated 05/30/2006 although generally an unlikely solitary manifestation of pulmonary metastatic disease. Attention on follow-up. 6. Right adrenal nodule, which is enlarged compared to prior examination dated 05/30/2006 although low-attenuation and very likely a benign adenoma. Attention on follow-up. 7. There is a pathologic fracture of the left femoral neck with soft tissue mass measuring 5.5 x 3.8 cm. 8. Trace bilateral pleural effusions without obvious nodularity or other manifestations of pleural metastatic disease. 9. Evidence of prior left lower lobe wedge resection. 10. Other chronic, incidental, and postoperative findings as detailed above. Coronary artery disease. Emphysema (ICD10-J43.9). Aortic Atherosclerosis (ICD10-I70.0). Electronically Signed   By: Eddie Candle M.D.   On: 07/29/2019 08:29   CT HIP LEFT WO CONTRAST  Result Date: 07/29/2019 CLINICAL DATA:  Severe hip pain. EXAM: CT OF THE LEFT HIP WITHOUT CONTRAST TECHNIQUE: Multidetector CT imaging of the left hip was performed according to the standard protocol. Multiplanar CT image reconstructions were also generated. COMPARISON:  MRI dated June 2021 FINDINGS: Bones/Joint/Cartilage There is a lytic mass eroding the left sacrum and left iliac bone measuring approximately 6.5 cm. There is a  lytic lesion involving the left femoral neck with an associated pathologic fracture. Ligaments Suboptimally assessed by CT. Muscles and Tendons The muscles are unremarkable where visualized. Soft tissues There is a small fat containing left inguinal hernia. There is scattered colonic diverticula without CT evidence for diverticulitis. The bladder is distended. IMPRESSION: 1. Lytic lesion involving the left femoral neck with associated pathologic fracture. 2. There is an additional lytic lesion in the left iliac bone and left sacrum. Findings are  concerning for metastatic disease of unknown origin. 3. Diverticulosis without CT evidence for diverticulitis. 4. Distended urinary bladder. 5. Fat containing left inguinal hernia. These results will be called to the ordering clinician or representative by the Radiologist Assistant, and communication documented in the PACS or [employer]. Electronically Signed   By: Constance Holster M.D.   On: 07/29/2019 03:27   CT FEMUR LEFT WO CONTRAST  Result Date: 07/30/2019 CLINICAL DATA:  Pathologic fracture of the left femur EXAM: CT OF THE LOWER LEFT EXTREMITY WITHOUT CONTRAST TECHNIQUE: Multidetector CT imaging of the lower left extremity was performed according to the standard protocol. COMPARISON:  CT and x-ray 07/29/2019 FINDINGS: Bones/Joint/Cartilage Expansile lytic mass centered at the left femoral neck with comminuted pathologic fracture involving the basicervical and intertrochanteric aspects of the femur. Fracture is mildly impacted. No significant displacement or angulation. Left femoral neck mass with prominent soft tissue component emanating from the posterior cortex in total measures approximately 5.6 x 4.2 x 4.5 cm (series 2, image 30; series 5, image 50). Known lytic lesion of the left ilium is partially visualized at the edge of the field of view (series 1, image 3), more fully characterized on CT abdomen-pelvis 07/29/2019. No additional lytic or sclerotic bony lesions are seen within the field of view. Mild joint space narrowing of the left hip. No dislocation. No evidence of femoral head avascular necrosis. Ligaments Suboptimally assessed by CT. Muscles and Tendons Soft tissue mass results in mass effect on the adjacent left quadratus femoris muscle without obvious muscular invasion, although evaluation is limited with noncontrast CT. Musculotendinous structures appear intact within the limitations of CT. Soft tissues No additional soft tissue masses. No fluid collection. Sigmoid diverticulosis  is noted. IMPRESSION: 1. Expansile lytic mass centered at the left femoral neck with comminuted pathologic fracture involving the basicervical and intertrochanteric aspects of the femur. Fracture is mildly impacted. No significant displacement or angulation. 2. Known lytic lesion of the left ilium is partially visualized at the edge of the field of view, more fully characterized on CT abdomen-pelvis 07/29/2019. Electronically Signed   By: Davina Poke D.O.   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 COMPARISON:  CT 07/29/2019 FINDINGS: Bones/Joint/Cartilage Lytic mildly expansile mass centered within the intertrochanteric aspect of the proximal right femur measuring 4.6 x 3.4 x 5.7 cm (series 3, image 33; series 9, image 72). There is significant cortical breakthrough of the posterior and superior cortex of the femoral neck (series 3, image 32; series 7, image 66). Mass occupies the full diameter of the inferior femoral neck. Small extraosseous soft tissue component along the posterior femoral neck is suggested with mild mass effect on the traversing quadratus femoris muscle. No complete pathologic fracture is evident at this time. Known lytic mass  in the posterior right ilium was not included within the field of view. Ill-defined area of lucency within the central aspect of the distal femoral metaphysis measuring 1.7 x 1.1 x 1.1 cm (series 3, image 155; series 7, image 62) is nonspecific and may reflect a area of prominent marrow fat. A small lytic lesion not excluded. No additional areas of lytic or sclerotic bone lesion is identified. Ligaments Suboptimally assessed by CT. Muscles and Tendons No acute myotendinous abnormality.  Soft tissues No soft tissue fluid collection. IMPRESSION: 1. Lytic mildly expansile mass centered within the intertrochanteric aspect of the proximal right femur measuring up to 5.7 cm with significant cortical breakthrough of the posterior and superior cortex of the femoral neck. Small extraosseous soft tissue component along the posterior femoral neck is suggested with mild mass effect on the traversing quadratus femoris muscle. This lesion is at high risk of pathologic fracture. 2. Ill-defined 1.7 cm area of lucency within the central aspect of the distal femoral metaphysis is nonspecific and may reflect a area of prominent marrow fat. A small lytic lesion not excluded. MRI could be performed for confirmation, as clinically indicated. Electronically Signed   By: Davina Poke D.O.   On: 07/30/2019 08:36   DG C-Arm 1-60 Min  Result Date: 08/02/2019 CLINICAL DATA:  Right intramedullary femoral IM nail for impending femoral fracture. EXAM: RIGHT FEMUR 2 VIEWS; DG C-ARM 1-60 MIN COMPARISON:  Preoperative imaging. FINDINGS: Six fluoroscopic spot views obtained in the operating room in frontal and lateral projections. Placement of intramedullary nail with trans trochanteric and distal locking screws. Known osseous metastatic disease is not well visualized on fluoroscopic spot views. Fluoroscopy time 1 minutes 43 seconds. Dose 7.30 mGy. IMPRESSION: Fluoroscopic spot views during right femoral intramedullary nail placement. Electronically Signed   By: Keith Rake M.D.   On: 08/02/2019 15:30   DG HIP OPERATIVE UNILAT W OR W/O PELVIS LEFT  Result Date: 07/30/2019 CLINICAL DATA:  Left hemiarthroplasty. EXAM: OPERATIVE LEFT HIP (WITH PELVIS IF PERFORMED) TECHNIQUE: Fluoroscopic spot image(s) were submitted for interpretation post-operatively. COMPARISON:  Radiograph yesterday. FINDINGS: Three spot images obtained in the operating room during left hip arthroplasty. Fluoroscopy time not provided. IMPRESSION:  Three spot images obtained in the operating room during left hip arthroplasty. Electronically Signed   By: Keith Rake M.D.   On: 07/30/2019 18:25   DG HIP UNILAT WITH PELVIS 2-3 VIEWS LEFT  Result Date: 07/29/2019 CLINICAL DATA:  Recent falls EXAM: DG HIP (WITH OR WITHOUT PELVIS) 2-3V LEFT COMPARISON:  None. FINDINGS: Frontal pelvis as well as frontal and lateral left hip images were obtained. There is a comminuted fracture of the basicervical and intertrochanteric regions the proximal left femur. There is slight displacement of fracture fragments at the basicervical femoral neck region. There is inferior avulsion of the lesser trochanter. No other fractures are evident. No dislocation. There is mild symmetric narrowing of each hip joint. Postoperative change noted at L4, L5, S1. A stimulator device is present over the right iliac crest. There are surgical clips in the right inguinal region. IMPRESSION: Comminuted fracture of the proximal left femur with basicervical intertrochanteric components. Avulsion lesser trochanter. Mild displacement of fracture fragments along the lateral portion of the basicervical portion of the fracture. No other fractures. No dislocation. Mild symmetric narrowing each hip joint. Electronically Signed   By: Lowella Grip III M.D.   On: 07/29/2019 13:17   DG FEMUR MIN 2 VIEWS LEFT  Result Date: 07/29/2019 CLINICAL DATA:  Proximal femur  fracture following fall EXAM: LEFT FEMUR 2 VIEWS COMPARISON:  Pelvis and left hip radiographs July 29, 2019 FINDINGS: Frontal and lateral views were obtained. There is a comminuted basicervical and intertrochanteric portions of the proximal left femur. No inferior displacement of the lesser trochanter. No more distal fractures are evident. No dislocation. There is mild narrowing of the left hip joint. The knee joint region appears normal. No joint effusion. Foci of arterial vascular calcification noted. IMPRESSION: Comminuted fracture  proximal left femur. Note that there is avulsion with inferior displacement of the lesser trochanter on the left. No new more distal fractures are evident. No dislocation. Mild narrowing left hip joint. Foci of arterial vascular calcification noted. Electronically Signed   By: Lowella Grip III M.D.   On: 07/29/2019 13:15   DG FEMUR, MIN 2 VIEWS RIGHT  Result Date: 08/02/2019 CLINICAL DATA:  Right intramedullary femoral IM nail for impending femoral fracture. EXAM: RIGHT FEMUR 2 VIEWS; DG C-ARM 1-60 MIN COMPARISON:  Preoperative imaging. FINDINGS: Six fluoroscopic spot views obtained in the operating room in frontal and lateral projections. Placement of intramedullary nail with trans trochanteric and distal locking screws. Known osseous metastatic disease is not well visualized on fluoroscopic spot views. Fluoroscopy time 1 minutes 43 seconds. Dose 7.30 mGy. IMPRESSION: Fluoroscopic spot views during right femoral intramedullary nail placement. Electronically Signed   By: Keith Rake M.D.   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 CONTRAST:  129m OMNIPAQUE IOHEXOL 300 MG/ML  SOLN COMPARISON:  CT left hip, 07/29/2019, MR lumbar spine, 07/28/2019 FINDINGS: CT CHEST FINDINGS Cardiovascular: Aortic atherosclerosis. Normal heart size. Three-vessel coronary artery calcifications and stents. No pericardial effusion. Mediastinum/Nodes: No enlarged mediastinal, hilar, or axillary lymph nodes. There is thickening of the esophagus near the gastroesophageal junction, likely a small hiatal hernia (series 5, image 73). Thyroid gland and trachea demonstrate no  significant findings. Lungs/Pleura: Mild centrilobular emphysema. Trace bilateral pleural effusions and bandlike scarring or atelectasis of the bilateral lung bases. Evidence of prior left lower lobe wedge resection. Nonspecific 6 mm pulmonary nodule of the right lung base, new compared to remote prior CT dated 05/30/2006 (series 4, image 108). Musculoskeletal: Lytic lesion involving the spine of the right scapula measuring 2.6 x 1.9 cm (series 3, image 1). Subtle lytic lesions involving the left manubrium measuring 1.4 x 1.3 cm (series 3, image 11) and the inner table of the inferior manubrium measuring 1.9 x 1.4 cm (series 3, image 16). There is a lytic osseous lesion involving the left aspect of the T9 and T10 vertebral bodies with and extraosseous soft tissue component, measuring 3.9 x 3.6 cm at the level of T10 (series 3, image 43). CT ABDOMEN PELVIS FINDINGS Hepatobiliary: There are multiple low-attenuation lesions of the liver, the majority of which appear to be simple cysts and stable compared to remote prior examination dated 05/30/2006, however there are multiple additional, ill-defined hypodense lesions, for example of the liver dome measuring 1.7 x 1.1 cm (series 3, image 46) and of the anterior right lobe of the liver measuring 1.6 x 1.5 cm (series 3, image 63). No gallstones, gallbladder wall thickening, or biliary dilatation. Pancreas: Unremarkable. No pancreatic ductal dilatation or  surrounding inflammatory changes. Spleen: Normal in size without significant abnormality. Adrenals/Urinary Tract: Right adrenal nodule measuring 2.2 x 2.1 cm, enlarged compared to prior examination dated 05/30/2006 although low-attenuation, HU = 9, and very likely a benign adenoma (series 3, image 55). There are bilateral simple cysts of the kidneys. 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 Bladder is unremarkable. Stomach/Bowel: Stomach is within normal limits. Appendix appears normal. No evidence of bowel wall  thickening, distention, or inflammatory changes. Sigmoid diverticulosis. Moderate burden of stool balls throughout the colon. Vascular/Lymphatic: Severe aortic atherosclerosis. No enlarged abdominal or pelvic lymph nodes. Reproductive: No mass or other abnormality. Other: No abdominal wall hernia or abnormality. Evidence of prior right inguinal hernia repair. No abdominopelvic ascites. Musculoskeletal: Lytic lesion of the right aspect of the ilium abutting the sacroiliac joint measuring 3.0 x 1.9 cm (series 3, image 101). Previously described lytic lesions involving the left hemi sacrum and adjacent ilium, measuring 5.6 x 4.7 cm (series 3, image 100) and with a pathologic fracture of the left femoral neck, soft tissue mass measuring 5.5 x 3.8 cm (series 3, image 12). Lytic lesion of the right femoral neck with erosion of the posterior cortex measuring 4.0 x 3.5 cm (series 3, image 125). IMPRESSION: 1. Multifocal osseous metastatic disease as detailed above, including a previously identified pathologic fracture of the left femoral neck. There are multiple additional lesions of mechanical significance at risk for pathologic fracture, particularly of the right femoral neck and T9 and T10 vertebral bodies. 2. Ill-defined low-attenuation lesions of the liver consistent with hepatic metastatic disease. 3. There is no candidate primary lesion confidently identified in the chest, abdomen, or pelvis. 4. Thickening of the esophagus near the gastroesophageal junction, likely a small hiatal hernia, esophageal malignancy less favored and without secondary findings such as adjacent lymphadenopathy. 5. Nonspecific 6 mm pulmonary nodule of the right lung base, new compared to remote prior CT dated 05/30/2006 although generally an unlikely solitary manifestation of pulmonary metastatic disease. Attention on follow-up. 6. Right adrenal nodule, which is enlarged compared to prior examination dated 05/30/2006 although low-attenuation  and very likely a benign adenoma. Attention on follow-up. 7. There is a pathologic fracture of the left femoral neck with soft tissue mass measuring 5.5 x 3.8 cm. 8. Trace bilateral pleural effusions without obvious nodularity or other manifestations of pleural metastatic disease. 9. Evidence of prior left lower lobe wedge resection. 10. Other chronic, incidental, and postoperative findings as detailed above. Coronary artery disease. Emphysema (ICD10-J43.9). Aortic Atherosclerosis (ICD10-I70.0). Electronically Signed   By: Eddie Candle M.D.   On: 07/29/2019 08:29   MR LUMBAR SPINE WO CONTRAST  Result Date: 07/28/2019 CLINICAL DATA:  Back pain with left lower extremity weakness EXAM: MRI LUMBAR SPINE WITHOUT CONTRAST TECHNIQUE: Multiplanar, multisequence MR imaging of the lumbar spine was performed. No intravenous contrast was administered. COMPARISON:  04/24/2017 FINDINGS: Some sequences had to be omitted due to SAR restrictions related to the patient's spinal stimulator. Segmentation: Standard. Alignment:  Physiologic. Vertebrae:  L3-5 PLIF.  No acute abnormality. Conus medullaris and cauda equina: Conus extends to the L1 level. Conus and cauda equina appear normal. Paraspinal and other soft tissues: Negative Disc levels: L1-L2: Normal disc space and facet joints. There is no spinal canal stenosis. No neural foraminal stenosis. L2-L3: Mild disc bulge. Endplate spurring. There is no spinal canal stenosis. Mild bilateral neural foraminal stenosis. L3-L4: PLIF. There is no spinal canal stenosis. Limited visualization of the neural foramina due to susceptibility effects from spinal hardware.  Within that limitation, no visible neural foraminal stenosis. L4-L5: PLIF. There is no spinal canal stenosis. Limited visualization of the neural foramina due to susceptibility effects from spinal hardware. Within that limitation, no visible neural foraminal stenosis. L5-S1: Normal disc space and facet joints. There is no spinal  canal stenosis. No neural foraminal stenosis. Visualized sacrum: Normal. IMPRESSION: 1. Scan time had to be limited due to SAR restrictions imposed by the patient's spinal stimulator device. This caused the examination to be truncated and of reduced image quality. 2. L3-5 PLIF without spinal canal stenosis. No visible foraminal stenosis within limitations caused by adjacent hardware. 3. Mild bilateral L2-3 neural foraminal stenosis. Electronically Signed   By: Ulyses Jarred M.D.   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 CONTRAST:  133m OMNIPAQUE IOHEXOL 300 MG/ML  SOLN COMPARISON:  CT left hip, 07/29/2019, MR lumbar spine, 07/28/2019 FINDINGS: CT CHEST FINDINGS Cardiovascular: Aortic atherosclerosis. Normal heart size. Three-vessel coronary artery calcifications and stents. No pericardial effusion. Mediastinum/Nodes: No enlarged mediastinal, hilar, or axillary lymph nodes. There is thickening of the esophagus near the gastroesophageal junction, likely a small hiatal hernia (series 5, image 73). Thyroid gland and trachea demonstrate no significant findings. Lungs/Pleura: Mild centrilobular emphysema. Trace bilateral pleural effusions and bandlike scarring or atelectasis of the bilateral lung bases. Evidence of prior left lower lobe wedge resection. Nonspecific 6 mm pulmonary nodule of the right lung base, new compared to remote prior CT dated 05/30/2006 (series 4, image 108). Musculoskeletal: Lytic lesion involving the spine of the right scapula measuring 2.6 x 1.9 cm (series 3, image 1). Subtle lytic lesions involving the left manubrium measuring 1.4 x  1.3 cm (series 3, image 11) and the inner table of the inferior manubrium measuring 1.9 x 1.4 cm (series 3, image 16). There is a lytic osseous lesion involving the left aspect of the T9 and T10 vertebral bodies with and extraosseous soft tissue component, measuring 3.9 x 3.6 cm at the level of T10 (series 3, image 43). CT ABDOMEN PELVIS FINDINGS Hepatobiliary: There are multiple low-attenuation lesions of the liver, the majority of which appear to be simple cysts and stable compared to remote prior examination dated 05/30/2006, however there are multiple additional, ill-defined hypodense lesions, for example of the liver dome measuring 1.7 x 1.1 cm (series 3, image 46) and of the anterior right lobe of the liver measuring 1.6 x 1.5 cm (series 3, image 63). No gallstones, gallbladder wall thickening, or biliary dilatation. Pancreas: Unremarkable. No pancreatic ductal dilatation or surrounding inflammatory changes. Spleen: Normal in size without significant abnormality. Adrenals/Urinary Tract: Right adrenal nodule measuring 2.2 x 2.1 cm, enlarged compared to prior examination dated 05/30/2006 although low-attenuation, HU = 9, and very likely a benign adenoma (series 3, image 55). There are bilateral simple cysts of the kidneys. 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 Bladder is unremarkable. Stomach/Bowel: Stomach is within normal limits. Appendix appears normal. No evidence of bowel wall thickening, distention, or inflammatory changes. Sigmoid diverticulosis. Moderate burden of stool balls throughout the colon. Vascular/Lymphatic: Severe aortic atherosclerosis. No enlarged abdominal or pelvic lymph nodes. Reproductive: No mass or other abnormality. Other: No abdominal wall hernia or abnormality. Evidence of  prior right inguinal hernia repair. No abdominopelvic ascites. Musculoskeletal: Lytic lesion of the right aspect of the ilium abutting the sacroiliac joint measuring 3.0 x 1.9 cm (series 3, image 101).  Previously described lytic lesions involving the left hemi sacrum and adjacent ilium, measuring 5.6 x 4.7 cm (series 3, image 100) and with a pathologic fracture of the left femoral neck, soft tissue mass measuring 5.5 x 3.8 cm (series 3, image 12). Lytic lesion of the right femoral neck with erosion of the posterior cortex measuring 4.0 x 3.5 cm (series 3, image 125). IMPRESSION: 1. Multifocal osseous metastatic disease as detailed above, including a previously identified pathologic fracture of the left femoral neck. There are multiple additional lesions of mechanical significance at risk for pathologic fracture, particularly of the right femoral neck and T9 and T10 vertebral bodies. 2. Ill-defined low-attenuation lesions of the liver consistent with hepatic metastatic disease. 3. There is no candidate primary lesion confidently identified in the chest, abdomen, or pelvis. 4. Thickening of the esophagus near the gastroesophageal junction, likely a small hiatal hernia, esophageal malignancy less favored and without secondary findings such as adjacent lymphadenopathy. 5. Nonspecific 6 mm pulmonary nodule of the right lung base, new compared to remote prior CT dated 05/30/2006 although generally an unlikely solitary manifestation of pulmonary metastatic disease. Attention on follow-up. 6. Right adrenal nodule, which is enlarged compared to prior examination dated 05/30/2006 although low-attenuation and very likely a benign adenoma. Attention on follow-up. 7. There is a pathologic fracture of the left femoral neck with soft tissue mass measuring 5.5 x 3.8 cm. 8. Trace bilateral pleural effusions without obvious nodularity or other manifestations of pleural metastatic disease. 9. Evidence of prior left lower lobe wedge resection. 10. Other chronic, incidental, and postoperative findings as detailed above. Coronary artery disease. Emphysema (ICD10-J43.9). Aortic Atherosclerosis (ICD10-I70.0). Electronically Signed   By:  Eddie Candle M.D.   On: 07/29/2019 08:29   CT HIP LEFT WO CONTRAST  Result Date: 07/29/2019 CLINICAL DATA:  Severe hip pain. EXAM: CT OF THE LEFT HIP WITHOUT CONTRAST TECHNIQUE: Multidetector CT imaging of the left hip was performed according to the standard protocol. Multiplanar CT image reconstructions were also generated. COMPARISON:  MRI dated June 2021 FINDINGS: Bones/Joint/Cartilage There is a lytic mass eroding the left sacrum and left iliac bone measuring approximately 6.5 cm. There is a lytic lesion involving the left femoral neck with an associated pathologic fracture. Ligaments Suboptimally assessed by CT. Muscles and Tendons The muscles are unremarkable where visualized. Soft tissues There is a small fat containing left inguinal hernia. There is scattered colonic diverticula without CT evidence for diverticulitis. The bladder is distended. IMPRESSION: 1. Lytic lesion involving the left femoral neck with associated pathologic fracture. 2. There is an additional lytic lesion in the left iliac bone and left sacrum. 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 3. Diverticulosis without CT evidence for diverticulitis. 4. Distended urinary bladder. 5. Fat containing left inguinal hernia. These results will be called to the ordering clinician or representative by the Radiologist Assistant, and communication documented in the PACS or [employer]. Electronically Signed   By: Constance Holster M.D.   On: 07/29/2019 03:27   CT FEMUR LEFT WO CONTRAST  Result Date: 07/30/2019 CLINICAL DATA:  Pathologic fracture of the left femur EXAM: CT OF THE LOWER LEFT EXTREMITY WITHOUT CONTRAST TECHNIQUE: Multidetector CT imaging of the lower left extremity was performed according to the standard protocol. COMPARISON:  CT and x-ray 07/29/2019 FINDINGS: Bones/Joint/Cartilage Expansile  lytic mass centered at the left femoral neck with comminuted pathologic fracture involving the basicervical and  intertrochanteric aspects of the femur. Fracture is mildly impacted. No significant displacement or angulation. Left femoral neck mass with prominent soft tissue component emanating from the posterior cortex in total measures approximately 5.6 x 4.2 x 4.5 cm (series 2, image 30; series 5, image 50). Known lytic lesion of the left ilium is partially visualized at the edge of the field of view (series 1, image 3), more fully characterized on CT abdomen-pelvis 07/29/2019. No additional lytic or sclerotic bony lesions are seen within the field of view. Mild joint space narrowing of the left hip. No dislocation. No evidence of femoral head avascular necrosis. Ligaments Suboptimally assessed by CT. Muscles and Tendons Soft tissue mass results in mass effect on the adjacent left quadratus femoris muscle without obvious muscular invasion, although evaluation is limited with noncontrast CT. Musculotendinous structures appear intact within the limitations of CT. Soft tissues No additional soft tissue masses. No fluid collection. Sigmoid diverticulosis is noted. IMPRESSION: 1. Expansile lytic mass centered at the left femoral neck with comminuted pathologic fracture involving the basicervical and intertrochanteric aspects of the femur. Fracture is mildly impacted. No significant displacement or angulation. 2. Known lytic lesion of the left ilium is partially visualized at the edge of the field of view, more fully characterized on CT abdomen-pelvis 07/29/2019. Electronically Signed   By: Davina Poke D.O.   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 COMPARISON:  CT 07/29/2019 FINDINGS: Bones/Joint/Cartilage Lytic mildly expansile mass centered  within the intertrochanteric aspect of the proximal right femur measuring 4.6 x 3.4 x 5.7 cm (series 3, image 33; series 9, image 72). There is significant cortical breakthrough of the posterior and superior cortex of the femoral neck (series 3, image 32; series 7, image 66). Mass occupies the full diameter of the inferior femoral neck. Small extraosseous soft tissue component along the posterior femoral neck is suggested with mild mass effect on the traversing quadratus femoris muscle. No complete pathologic fracture is evident at this time. Known lytic mass in the posterior right ilium was not included within the field of view. Ill-defined area of lucency within the central aspect of the distal femoral metaphysis measuring 1.7 x 1.1 x 1.1 cm (series 3, image 155; series 7, image 62) is nonspecific and may reflect a area of prominent marrow fat. A small lytic lesion not excluded. No additional areas of lytic or sclerotic bone lesion is identified. Ligaments Suboptimally assessed by CT. Muscles and Tendons No acute myotendinous abnormality. Soft tissues No soft tissue fluid collection. IMPRESSION: 1. Lytic mildly expansile mass centered within the intertrochanteric aspect of the proximal right femur measuring up to 5.7 cm with significant cortical breakthrough of the posterior and superior cortex of the femoral neck. Small extraosseous soft tissue component along the posterior femoral neck is suggested with mild mass effect on the traversing quadratus femoris muscle. This lesion is at high risk of pathologic fracture. 2. Ill-defined 1.7 cm area of lucency within the central aspect of the distal femoral metaphysis is nonspecific and may reflect a area of prominent marrow fat. A small lytic lesion not excluded. MRI could be performed for confirmation, as clinically indicated. Electronically  Signed   By: Davina Poke D.O.   On: 07/30/2019 08:36   DG C-Arm 1-60 Min  Result Date: 08/02/2019 CLINICAL DATA:  Right  intramedullary femoral IM nail for impending femoral fracture. EXAM: RIGHT FEMUR 2 VIEWS; DG C-ARM 1-60 MIN COMPARISON:  Preoperative imaging. FINDINGS: Six fluoroscopic spot views obtained in the operating room in frontal and lateral projections. Placement of intramedullary nail with trans trochanteric and distal locking screws. Known osseous metastatic disease is not well visualized on fluoroscopic spot views. Fluoroscopy time 1 minutes 43 seconds. Dose 7.30 mGy. IMPRESSION: Fluoroscopic spot views during right femoral intramedullary nail placement. Electronically Signed   By: Keith Rake M.D.   On: 08/02/2019 15:30   DG HIP OPERATIVE UNILAT W OR W/O PELVIS LEFT  Result Date: 07/30/2019 CLINICAL DATA:  Left hemiarthroplasty. EXAM: OPERATIVE LEFT HIP (WITH PELVIS IF PERFORMED) TECHNIQUE: Fluoroscopic spot image(s) were submitted for interpretation post-operatively. COMPARISON:  Radiograph yesterday. FINDINGS: Three spot images obtained in the operating room during left hip arthroplasty. Fluoroscopy time not provided. IMPRESSION: Three spot images obtained in the operating room during left hip arthroplasty. Electronically Signed   By: Keith Rake M.D.   On: 07/30/2019 18:25   DG HIP UNILAT WITH PELVIS 2-3 VIEWS LEFT  Result Date: 07/29/2019 CLINICAL DATA:  Recent falls EXAM: DG HIP (WITH OR WITHOUT PELVIS) 2-3V LEFT COMPARISON:  None. FINDINGS: Frontal pelvis as well as frontal and lateral left hip images were obtained. There is a comminuted fracture of the basicervical and intertrochanteric regions the proximal left femur. There is slight displacement of fracture fragments at the basicervical femoral neck region. There is inferior avulsion of the lesser trochanter. No other fractures are evident. No dislocation. There is mild symmetric narrowing of each hip joint. Postoperative change noted at L4, L5, S1. A stimulator device is present over the right iliac crest. There are surgical clips in the  right inguinal region. IMPRESSION: Comminuted fracture of the proximal left femur with basicervical intertrochanteric components. Avulsion lesser trochanter. Mild displacement of fracture fragments along the lateral portion of the basicervical portion of the fracture. No other fractures. No dislocation. Mild symmetric narrowing each hip joint. Electronically Signed   By: Lowella Grip III M.D.   On: 07/29/2019 13:17   DG FEMUR MIN 2 VIEWS LEFT  Result Date: 07/29/2019 CLINICAL DATA:  Proximal femur fracture following fall EXAM: LEFT FEMUR 2 VIEWS COMPARISON:  Pelvis and left hip radiographs July 29, 2019 FINDINGS: Frontal and lateral views were obtained. There is a comminuted basicervical and intertrochanteric portions of the proximal left femur. No inferior displacement of the lesser trochanter. No more distal fractures are evident. No dislocation. There is mild narrowing of the left hip joint. The knee joint region appears normal. No joint effusion. Foci of arterial vascular calcification noted. IMPRESSION: Comminuted fracture proximal left femur. Note that there is avulsion with inferior displacement of the lesser trochanter on the left. No new more distal fractures are evident. No dislocation. Mild narrowing left hip joint. Foci of arterial vascular calcification noted. Electronically Signed   By: Lowella Grip III M.D.   On: 07/29/2019 13:15   DG FEMUR, MIN 2 VIEWS RIGHT  Result Date: 08/02/2019 CLINICAL DATA:  Right intramedullary femoral IM nail for impending femoral fracture. EXAM: RIGHT FEMUR 2 VIEWS; DG C-ARM 1-60 MIN COMPARISON:  Preoperative imaging. FINDINGS: Six fluoroscopic spot views obtained in the operating room in frontal and lateral projections. Placement of intramedullary nail with trans trochanteric and distal locking screws. Known  osseous metastatic disease is not well visualized on fluoroscopic spot views. Fluoroscopy time 1 minutes 43 seconds. Dose 7.30 mGy. IMPRESSION:  Fluoroscopic spot views during right femoral intramedullary nail placement. Electronically Signed   By: Keith Rake M.D.   On: 08/02/2019 15:30   Pathology:  SURGICAL PATHOLOGY  CASE: [id number]  PATIENT: Greg Hughes  Surgical Pathology Report   Clinical History: pathologic left hip fracture (cm)   FINAL MICROSCOPIC DIAGNOSIS:   A. FEMORAL HEAD, LEFT PROXIMAL, RESECTION:  Metastatic high-grade carcinoma, see comment   B. FEMORAL HEAD, LEFT:  - Metastatic high-grade carcinoma, see comment    COMMENT:   A and B. Immunohistochemical stains show that the tumor cells are  positive for CK8/18 with patchy positive staining for AE1/AE3, p63 and  CK 5/6. Immunostains for CK7, CK20, TTF-1, Napsin-A, synaptophysin and  CD56 are negative. 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 Dr. Tresa Moore reviewed the case and concurs with the diagnosis.   INTRAOPERATIVE DIAGNOSIS:  A. Left proximal femur bone lesion: "Malignant cells present."  Intraoperative diagnosis rendered by Dr. Jeannie Done at 4:10 PM on  07/31/2019.   GROSS DESCRIPTION:   Specimen A: Received fresh for rapid intraoperative consult are  fragments of tan-red bone and fragments of soft tan-white soft tissue.  The specimen has an aggregate measurement of 3.5 x 3.5 x 1.5 cm.  Portions of the soft tissue are submitted for frozen section in 1 block.  Additional tissue is submitted for routine histology in 1 cassette.  1 = tissue submitted for frozen section  2 = additional representative tissue (GRP 07/31/2019)   Specimen B: Received fresh is a 6.6 cm in length and 4.7 cm in diameter  portion of femoral head, clinically left, which has a jagged irregular  femoral neck margin of resection. The articular surface is tan-yellow  to hyperemic, with nodular  granularity involving approximately half of  the surface. Cut surfaces have predominantly yellow-red firm trabecular  bone, with focal area of softening within the femoral neck. There are  no discrete mass lesions.  Block summary:  Block 1= section to include articular surface  Blocks 2, 3 = sections at femoral neck margin  Bone is decalcified. (Blocks 2, 3 decalcified with Immunocal)   SW 08/02/2019   Final Diagnosis performed by Jaquita Folds, MD.  Electronically  signed 08/05/2019   Assessment and Plan:  1.  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  PET scan 12/30/2018-solid left lower lobe nodule, 2 cm with SUV of 3.6, additional subcentimeter pulmonary nodules below PET threshold, liver cyst and too small to characterize liver lesions, focal FDG activity in the left prostate  02/24/2019-left lower lobe segmentectomy and mediastinal lymphadenectomy at [facility]  Pathology-lymph nodes negative, though a level 13 lymph node had a focus of atypical cells within fibrotic tissue subjacent to the lymphoid tissue morphologically similar to the lung tumor, left lower lobe segment 8 segmentectomy-high-grade carcinoma with epithelial and myoepithelial differentiation, 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  Pathologic fracture nonspecific 6 mm right base nodule-new from April 2008 of the left femoral neck  -Status post left hip hemiarthroplasty on 07/30/2019 3.  Impending fracture of the  right femur  -Status post intramedullary fixation of the right femur on 08/02/2019 4.  Hypertension 5.  COPD 6.  Paroxysmal atrial fibrillation 7.  CAD 8.  Hyperlipidemia  9.  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 Mowrey has newly diagnosed metastatic lung cancer.  He is having significant pain to his back and pelvic area.  He is scheduled to begin palliative radiation early next week.  Biopsy and imaging results have been discussed with the patient and his wife.  We discussed that his disease is treatable, but not curable.  He is very deconditioned and would likely not tolerate chemotherapy very well, but can consider him for immunotherapy. We also discussed the option of focusing on comfort and going home with hospice.  I note that there is already a referral to hospice in process.  The patient and his wife want to think about their options some more.  We discussed that if he wanted to consider immunotherapy, we needed additional information from his biopsy including PD-L1 and foundation 1 testing.  Recommendations: 1.  We will send tissue for PD-L1 and foundation 1 testing (requested) 2.  Recommend treatment with immunotherapy pending results of above versus referral to hospice.  The patient and his wife wish to think about this some more. 3.  If he opts to pursue treatment, will need an MRI of the brain with and without contrast to complete his staging work-up. 4.  We could also consider him for bisphosphonate therapy depending on his decision. 5.  Proceed with palliative radiation to areas of pain 6.  Continue narcotic analgesics for pain, will need to discontinue Decadron if he decides to pursue immunotherapy,  We will follow-up tomorrow morning with the patient and his wife.   Thank you for this referral.   Mikey Bussing, DNP, AGPCNP-BC, AOCNP   Mr. Chavarin was interviewed and examined.  I reviewed the CT images.  I reviewed records from [place].  He was diagnosed with a  stage I primary left lower lobe cancer in January of this year.  The tumor had an unusual histology with features of a salivary gland tumor.  The pathology from the left femoral fracture reveals similar pathologic features.  He appears to have metastatic non-small cell lung cancer.  I discussed the diagnosis and treatment options with Mr. Auriemma and his wife.  We discussed treatment with systemic chemotherapy, immunotherapy, and supportive care.  He indicated that he may wish to pursue a comfort care approach.  We will submit tissue for PD-L1 testing to determine whether he may have a significant chance of responding to immunotherapy.  He is symptomatic with pain secondary to lytic bone lesions and fractures.  I recommend adding a long-acting narcotic and increasing the breakthrough Dilaudid dose.  We will continue following Mr. Vavra in the hospital and outpatient follow-up will be arranged at the Cancer center.

## 2019-08-05 NOTE — Progress Notes (Signed)
Care link present and the patient has been transferred to [facility] for radiology treatment

## 2019-08-06 LAB — CBC
HCT: 24.1 % — ABNORMAL LOW (ref 39.0–52.0)
Hemoglobin: 7.7 g/dL — ABNORMAL LOW (ref 13.0–17.0)
MCH: 30.2 pg (ref 26.0–34.0)
MCHC: 32 g/dL (ref 30.0–36.0)
MCV: 94.5 fL (ref 80.0–100.0)
Platelets: 423 10*3/uL — ABNORMAL HIGH (ref 150–400)
RBC: 2.55 MIL/uL — ABNORMAL LOW (ref 4.22–5.81)
RDW: 14.1 % (ref 11.5–15.5)
WBC: 18.1 10*3/uL — ABNORMAL HIGH (ref 4.0–10.5)
nRBC: 0.3 % — ABNORMAL HIGH (ref 0.0–0.2)

## 2019-08-06 LAB — CREATININE, SERUM
Creatinine, Ser: 1.05 mg/dL (ref 0.61–1.24)
GFR calc Af Amer: >60 mL/min (ref >60)
GFR calc non Af Amer: >60 mL/min (ref >60)

## 2019-08-06 LAB — BASIC METABOLIC PANEL
Anion gap: 9 (ref 5–15)
BUN: 23 mg/dL (ref 8–23)
CO2: 26 mmol/L (ref 22–32)
Calcium: 9.8 mg/dL (ref 8.9–10.3)
Chloride: 106 mmol/L (ref 98–111)
Creatinine, Ser: 1.05 mg/dL (ref 0.61–1.24)
GFR calc Af Amer: >60 mL/min (ref >60)
GFR calc non Af Amer: >60 mL/min (ref >60)
Glucose, Bld: 135 mg/dL — ABNORMAL HIGH (ref 70–99)
Potassium: 4.9 mmol/L (ref 3.5–5.1)
Sodium: 141 mmol/L (ref 135–145)

## 2019-08-06 MED ORDER — MORPHINE SULFATE ER 15 MG PO TBCR
15.0000 mg | EXTENDED_RELEASE_TABLET | Freq: Two times a day (BID) | ORAL | Status: DC
  Administered 2019-08-06 – 2019-08-09 (×7): 15 mg via ORAL
  Filled 2019-08-06 (×7): qty 1

## 2019-08-06 MED ORDER — LIDOCAINE 5 % EX PTCH
2.0000 | MEDICATED_PATCH | CUTANEOUS | Status: DC
  Administered 2019-08-06 – 2019-08-09 (×4): 2 via TRANSDERMAL
  Filled 2019-08-06 (×5): qty 2

## 2019-08-06 MED ORDER — DEXAMETHASONE 6 MG PO TABS
6.0000 mg | ORAL_TABLET | Freq: Two times a day (BID) | ORAL | Status: DC
  Administered 2019-08-06 – 2019-08-09 (×6): 6 mg via ORAL
  Filled 2019-08-06 (×6): qty 1

## 2019-08-06 MED ORDER — DICLOFENAC SODIUM 1 % EX GEL
4.0000 g | Freq: Four times a day (QID) | CUTANEOUS | Status: DC
  Administered 2019-08-06 – 2019-08-09 (×12): 4 g via TOPICAL
  Filled 2019-08-06: qty 100

## 2019-08-06 MED ORDER — HYDROMORPHONE HCL 2 MG PO TABS: 2.0000 mg | ORAL_TABLET | ORAL | 0 refills | Status: AC | PRN

## 2019-08-06 NOTE — Progress Notes (Signed)
PROGRESS NOTE    Greg Hughes  ZDG:[id number] DOB: [birth year] DOA: 07/28/2019 PCP: Tonia Ghent, MD    Chief Complaint  Patient presents with  . Hip Pain    Brief Narrative:   79 year old male with CAD, BPH, OSA, paroxysmal A. fib on Xarelto, recent hospitalization at [facility] in January 2021 for a lung nodule status post resection (I am unable to find pathology reports), came to the hospital with severe low back pain, hip pain and inability to walk. Several weeks ago while he was mowing the lawn felt like his hip was popping out of socket, followed by progressively worsening pain. He was seen in the ED on midnight and x-ray was unremarkable. Pain continued and came back to the hospital. CT scan now concern for multiple metastatic osteolytic lesions and a pathological fracture to the left hip.  He is now status post left hip hemiarthroplasty as well as prophylactic IM nailing of the right femur. Palliative care consulted, and on further discussions , he was transitioned to DNR and hospice referral.  Oncology consulted, recommend outpatient follow up for chemotherapy vs immunotherapy vs comfort based approach. Currently waiting for the pathology results. Meanwhile Radiation oncology consulted to proceed with palliaitive radiation.     Assessment & Plan:   Principal Problem:   Intractable low back pain Active Problems:   Mixed hyperlipidemia   Essential hypertension   COPD (chronic obstructive pulmonary disease) (HCC)   AF (paroxysmal atrial fibrillation) (HCC)   Gout   Benign prostatic hyperplasia with lower urinary tract symptoms   Chronic diastolic CHF (congestive heart failure) (HCC)   Coronary artery disease involving native coronary artery of native heart   Pathological fracture in neoplastic disease, hip, unspecified, initial encounter for fracture [facility])   Palliative care by specialist   Goals of care, counseling/discussion   DNR (do not resuscitate)  Left femoral  neck pathological fracture probably secondary to the lytic lesion involving the left femoral neck with associated pathological fracture, additional lytic lesion in the left iliac bone and left sacrum concerning for metastatic disease. Orthopedics consulted and he underwent left hip hemiarthroplasty, posterior approach and open biopsy of the left femoral bone lesion on 07/30/2019.  CT scan showed extensive metastatic disease in the right femur, and he underwent prophylactic intramedullary fixation of the right femur. PT evaluation recommending SNF at this time. Pain control. Family would like to pursue rehabilitation and then possibly home with hospice.     Metastatic disease of unknown primary Patient had a history of lung nodule s/p resection. Imaging of the chest abdomen and pelvis shows extensive metastatic disease to the bone, liver and there is 6 mm pulmonary nodule in the right lung base. Results from the biopsy are pending. Oncology consulted and  recommend outpatient follow up for chemotherapy vs immunotherapy vs comfort based approach. Currently waiting for the pathology results. Meanwhile Radiation oncology consulted to proceed with palliaitive radiation.  Pain control .  In view of multiple medical problems, extensive metastatic disease, deconditioning, palliative care consulted, and he was transitioned to DNR, and hospice referral placed.    Essential hypertension Well controlled.    COPD Stable.  Paroxysmal atrial fibrillation  rate controlled on metoprolol. Continue with home medications. Continue with xarelto for anti coagulation.     Coronary artery disease Continue with statin and beta-blocker. Patient denies any chest pain or shortness of breath   Hyperlipidemia Continue with statin   Constipation Continue with MiraLAX.   Historically, patient is also status  post C5-C7 ACDF and L3-L5 posterior spinal and interbody  fusion and spinal cord simulator    Bph: Continue with flomax.   Hypokalemia Replaced.    Leukocytosis:  Probably reactive.   Constipation;  Continue with senna, miralax.    DVT prophylaxis:  Xarelto.  Code Status: FULL CODE.  Family Communication: wife at bedside.  Disposition:   Status is: Inpatient  Remains inpatient appropriate because:Unsafe d/c plan   Dispo: The patient is from: Home              Anticipated d/c is to: SNF              Anticipated d/c date is: 2 days              Patient currently is not medically stable to d/c.       Consultants:   Orthopedics.    Procedures: s/p IM nailing of the right femur.  Antimicrobials: NONE.   Subjective One BM yesterday.   Objective: Vitals:   08/06/19 0331 08/06/19 0907 08/06/19 0915 08/06/19 1330  BP: 109/72 136/80 114/67 98/60  Pulse: 98 (!) 140 (!) 110 (!) 59  Resp: 18 16 15 18   Temp: 98.2 F (36.8 C) 98.1 F (36.7 C) 98 F (36.7 C) 98.7 F (37.1 C)  TempSrc: Oral Oral Oral Oral  SpO2: 94% 96% 99% 96%  Weight:      Height:        Intake/Output Summary (Last 24 hours) at 08/06/2019 1441 Last data filed at 08/06/2019 1300 Gross per 24 hour  Intake 360 ml  Output 951 ml  Net -591 ml   Filed Weights   07/28/19 1735 08/04/19 0910  Weight: 79.4 kg 79.3 kg    Examination:  General exam: alert and comfortable, no  Distress. Respiratory system: clear to auscultation, no wheezing or rhonchi.  Cardiovascular system: S1S2, RRR, NO JVD, no pedal edema.  Gastrointestinal system: Abdomen is soft, non tender non distended, bowel sounds wnl.  Central nervous system: alert and responding to questions.  Extremities: painful ROM, no cyanosis.  Skin: No rashes.  Psychiatry:  Mood is appropriate.     Data Reviewed: I have personally reviewed following labs and imaging studies  CBC: Recent Labs  Lab 08/02/19 0146 08/03/19 0209 08/04/19 0810 08/05/19 0335 08/06/19 0328  WBC 15.5* 12.3* 17.7* 17.5* 18.1*  HGB 9.0* 7.0* 7.6*  7.5* 7.7*  HCT 27.3* 20.9* 23.2* 23.2* 24.1*  MCV 92.5 92.1 92.1 93.2 94.5  PLT 244 237 370 387 423*    Basic Metabolic Panel: Recent Labs  Lab 07/31/19 0451 07/31/19 0451 08/01/19 0755 08/02/19 0146 08/03/19 0209 08/04/19 0810 08/06/19 0328  NA 139  --  137 137 139 138  --   K 3.4*  --  3.7 3.7 4.1 3.2*  --   CL 103  --  103 101 104 102  --   CO2 26  --  24 26 24 26   --   GLUCOSE 126*  --  132* 104* 145* 98  --   BUN 21  --  19 18 21  24*  --   CREATININE 1.21   < > 1.08 1.06 1.01 1.07 1.05  CALCIUM 9.8  --  9.7 9.8 9.9 9.6  --   MG  --   --   --  1.9  --   --   --    < > = values in this interval not displayed.    GFR: Estimated Creatinine Clearance: 58.9 mL/min (  by C-G formula based on SCr of 1.05 mg/dL).  Liver Function Tests: No results for input(s): AST, ALT, ALKPHOS, BILITOT, PROT, ALBUMIN in the last 168 hours.  CBG: No results for input(s): GLUCAP in the last 168 hours.   Recent Results (from the past 240 hour(s))  SARS Coronavirus 2 by RT PCR (hospital order, performed in [facility] hospital lab) Nasopharyngeal Nasopharyngeal Swab     Status: None   Collection Time: 07/29/19 12:27 AM   Specimen: Nasopharyngeal Swab  Result Value Ref Range Status   SARS Coronavirus 2 NEGATIVE NEGATIVE Final    Comment: (NOTE) SARS-CoV-2 target nucleic acids are NOT DETECTED.  The SARS-CoV-2 RNA is generally detectable in upper and lower respiratory specimens during the acute phase of infection. The lowest concentration of SARS-CoV-2 viral copies this assay can detect is 250 copies / mL. A negative result does not preclude SARS-CoV-2 infection and should not be used as the sole basis for treatment or other patient management decisions.  A negative result may occur with improper specimen collection / handling, submission of specimen other than nasopharyngeal swab, presence of viral mutation(s) within the areas targeted by this assay, and inadequate number of viral  copies (<250 copies / mL). A negative result must be combined with clinical observations, patient history, and epidemiological information.  Fact Sheet for Patients:   StrictlyIdeas.no  Fact Sheet for Healthcare Providers: BankingDealers.co.za  This test is not yet approved or  cleared by the [place] FDA and has been authorized for detection and/or diagnosis of SARS-CoV-2 by FDA under an Emergency Use Authorization (EUA).  This EUA will remain in effect (meaning this test can be used) for the duration of the COVID-19 declaration under Section 564(b)(1) of the Act, 21 U.S.C. section 360bbb-3(b)(1), unless the authorization is terminated or revoked sooner.  Performed at [facility], [place] [address], [place], [place] [address]   Surgical pcr screen     Status: None   Collection Time: 07/30/19 10:32 AM   Specimen: Nasal Mucosa; Nasal Swab  Result Value Ref Range Status   MRSA, PCR NEGATIVE NEGATIVE Final   Staphylococcus aureus NEGATIVE NEGATIVE Final    Comment: (NOTE) The Xpert SA Assay (FDA approved for NASAL specimens in patients 43 years of age and older), is one component of a comprehensive surveillance program. It is not intended to diagnose infection nor to guide or monitor treatment. Performed at [facility], [place] [address], [place], [place] [address]          Radiology Studies: No results found.      Scheduled Meds: . acetaminophen  1,000 mg Oral TID  . allopurinol  300 mg Oral Daily  . calcitonin (salmon)  1 spray Alternating Nares Daily  . Chlorhexidine Gluconate Cloth  6 each Topical Daily  . dexamethasone  6 mg Oral BID  . diclofenac Sodium  4 g Topical QID  . diltiazem  180 mg Oral Daily  . finasteride  5 mg Oral Daily  . lidocaine  2 patch Transdermal Q24H  . metoprolol succinate  50 mg Oral Daily  . morphine  15 mg Oral Q12H  . pantoprazole  40 mg Oral Daily  . polyethylene  glycol  17 g Oral Daily  . pregabalin  25 mg Oral BID  . rivaroxaban  20 mg Oral Q supper  . senna  1 tablet Oral BID  . simvastatin  10 mg Oral q1800  . tamsulosin  0.8 mg Oral Daily  Continuous Infusions: . lactated ringers 10 mL/hr at 08/02/19 1132     LOS: 8 days       Hosie Poisson, MD [facility]   To contact the attending provider between 7A-7P or the covering provider during after hours 7P-7A, please log into the web site [email or web address] and access using universal [place] password for that web site. If you do not have the password, please call the hospital operator.  08/06/2019, 2:41 PM

## 2019-08-06 NOTE — NC FL2 (Signed)
[place] LEVEL OF CARE SCREENING TOOL     IDENTIFICATION  Patient Name: Greg Hughes Birthdate: 1941/01/05 Sex: male Admission Date (Current Location): 07/28/2019  [facility] and [place] Number:  Herbalist and Address:  [facility], [employer] [address] [place], [place] [address]      Provider Number: [id number]  Attending Physician Name and Address:  Hosie Poisson, MD  Relative Name and Phone Number:       Current Level of Care: Hospital Recommended Level of Care: [place] Prior Approval Number:    Date Approved/Denied:   PASRR Number: [id number]  Discharge Plan: SNF    Current Diagnoses: Patient Active Problem List   Diagnosis Date Noted  . Palliative care by specialist   . Goals of care, counseling/discussion   . DNR (do not resuscitate)   . Chronic diastolic CHF (congestive heart failure) ([place]) 07/29/2019  . Coronary artery disease involving native coronary artery of native heart 07/29/2019  . Pathological fracture in neoplastic disease, hip, unspecified, initial encounter for fracture ([place]) 07/29/2019  . Malnutrition of mild degree ([place]) 07/21/2019  . Intractable low back pain 07/21/2019  . Abnormal CBC 10/15/2018  . Health care maintenance 10/03/2017  . PSA elevation 10/03/2017  . History of melanoma   . Advance care planning 06/08/2017  . Benign fibroma of prostate 04/17/2015  . Encounter for anticoagulation discussion and counseling 03/09/2015  . S/P cervical spinal fusion 06/16/2014  . Aortic atherosclerosis ([place]) 05/16/2014  . Adrenal adenoma 04/21/2014  . Cystic disease of kidney 03/03/2014  . Spinal stenosis of lumbar region without neurogenic claudication 10/07/2013  . Lung nodules 01/05/2013  . Medicare annual wellness visit, subsequent 09/30/2012  . Gout 09/30/2012  . AF (paroxysmal atrial fibrillation) ([place]) 11/18/2011  . COPD (chronic obstructive pulmonary disease) ([place]) 04/30/2010   . ERECTILE DYSFUNCTION 10/20/2007  . Benign prostatic hyperplasia with lower urinary tract symptoms 08/09/2007  . TOBACCO ABUSE 02/10/2007  . Microscopic hematuria 02/10/2007  . BACK PAIN, LUMBAR, CHRONIC 02/10/2007  . Mixed hyperlipidemia 02/05/2007  . Essential hypertension 02/05/2007    Orientation RESPIRATION BLADDER Height & Weight     Self  Normal Incontinent Weight: 79.3 kg Height:  5\' 10"  (177.8 cm)  BEHAVIORAL SYMPTOMS/MOOD NEUROLOGICAL BOWEL NUTRITION STATUS      Continent Diet (see discharge summary)  AMBULATORY STATUS COMMUNICATION OF NEEDS Skin   Extensive Assist Verbally Surgical wounds (closed incision left leg)                       Personal Care Assistance Level of Assistance  Bathing, Feeding, Dressing Bathing Assistance: Maximum assistance Feeding assistance: Independent Dressing Assistance: Maximum assistance     Functional Limitations Info  Hearing, Sight, Speech Sight Info: Adequate Hearing Info: Adequate Speech Info: Adequate    SPECIAL CARE FACTORS FREQUENCY  PT (By licensed PT), OT (By licensed OT)     PT Frequency: 5x week OT Frequency: 5x week            Contractures Contractures Info: Not present    Additional Factors Info  Code Status, Allergies Code Status Info: Full Code Allergies Info: No Known Allergies           Current Medications (08/06/2019):  This is the current hospital active medication list Current Facility-Administered Medications  Medication Dose Route Frequency Provider Last Rate Last Admin  . acetaminophen (TYLENOL) tablet 1,000 mg  1,000 mg Oral TID Rosezella Rumpf, NP  1,000 mg at 08/06/19 0959  . allopurinol (ZYLOPRIM) tablet 300 mg  300 mg Oral Daily Rod Can, MD   300 mg at 08/06/19 1000  . alum & mag hydroxide-simeth (MAALOX/MYLANTA) 200-200-20 MG/5ML suspension 15 mL  15 mL Oral Q6H PRN Rod Can, MD   15 mL at 08/01/19 1220  . calcitonin (salmon) (MIACALCIN/FORTICAL) nasal spray  1 spray  1 spray Alternating Nares Daily Rosezella Rumpf, NP   1 spray at 08/06/19 1000  . Chlorhexidine Gluconate Cloth 2 % PADS 6 each  6 each Topical Daily Rod Can, MD   6 each at 08/06/19 1000  . dexamethasone (DECADRON) tablet 6 mg  6 mg Oral BID Rosezella Rumpf, NP      . diclofenac Sodium (VOLTAREN) 1 % topical gel 4 g  4 g Topical QID Rosezella Rumpf, NP   4 g at 08/06/19 1231  . diltiazem (CARDIZEM CD) 24 hr capsule 180 mg  180 mg Oral Daily Rod Can, MD   180 mg at 08/06/19 0958  . finasteride (PROSCAR) tablet 5 mg  5 mg Oral Daily Rod Can, MD   5 mg at 08/06/19 0959  . HYDROmorphone (DILAUDID) injection 0.5 mg  0.5 mg Intravenous Q3H PRN Rosezella Rumpf, NP   0.5 mg at 08/06/19 0933   Or  . HYDROmorphone (DILAUDID) injection 1 mg  1 mg Intravenous Q3H PRN Rosezella Rumpf, NP   1 mg at 08/05/19 1839  . HYDROmorphone (DILAUDID) tablet 2 mg  2 mg Oral Q3H PRN Rod Can, MD   2 mg at 08/06/19 0752  . lactated ringers infusion   Intravenous Continuous Rod Can, MD 10 mL/hr at 08/02/19 1132 Restarted at 08/02/19 1255  . lidocaine (LIDODERM) 5 % 2 patch  2 patch Transdermal Q24H Rosezella Rumpf, NP   2 patch at 08/06/19 1232  . menthol-cetylpyridinium (CEPACOL) lozenge 3 mg  1 lozenge Oral PRN Swinteck, Aaron Edelman, MD       Or  . phenol (CHLORASEPTIC) mouth spray 1 spray  1 spray Mouth/Throat PRN Swinteck, Aaron Edelman, MD      . metoCLOPramide (REGLAN) tablet 5-10 mg  5-10 mg Oral Q8H PRN Rosezella Rumpf, NP       Or  . metoCLOPramide (REGLAN) injection 5-10 mg  5-10 mg Intravenous Q8H PRN Rosezella Rumpf, NP      . metoprolol succinate (TOPROL-XL) 24 hr tablet 50 mg  50 mg Oral Daily Rod Can, MD   50 mg at 08/06/19 0959  . morphine (MS CONTIN) 12 hr tablet 15 mg  15 mg Oral Q12H Rosezella Rumpf, NP   15 mg at 08/06/19 1231  . ondansetron (ZOFRAN) tablet 4 mg  4 mg Oral Q6H PRN Swinteck, Aaron Edelman, MD       Or  .  ondansetron (ZOFRAN) injection 4 mg  4 mg Intravenous Q6H PRN Swinteck, Aaron Edelman, MD      . pantoprazole (PROTONIX) EC tablet 40 mg  40 mg Oral Daily Rosezella Rumpf, NP   40 mg at 08/06/19 0959  . polyethylene glycol (MIRALAX / GLYCOLAX) packet 17 g  17 g Oral Daily Rod Can, MD   17 g at 08/06/19 0959  . pregabalin (LYRICA) capsule 25 mg  25 mg Oral BID Rod Can, MD   25 mg at 08/06/19 0958  . rivaroxaban (XARELTO) tablet 20 mg  20 mg Oral Q supper Hosie Poisson, MD   20 mg at 08/05/19 1644  . senna (SENOKOT)  tablet 8.6 mg  1 tablet Oral BID Rosezella Rumpf, NP   8.6 mg at 08/06/19 0959  . simvastatin (ZOCOR) tablet 10 mg  10 mg Oral q1800 Rod Can, MD   10 mg at 08/05/19 1449  . tamsulosin (FLOMAX) capsule 0.8 mg  0.8 mg Oral Daily Rod Can, MD   0.8 mg at 08/06/19 0959  . traZODone (DESYREL) tablet 50 mg  50 mg Oral QHS PRN Rosezella Rumpf, NP   50 mg at 08/06/19 0220     Discharge Medications: Please see discharge summary for a list of discharge medications.  Relevant Imaging Results:  Relevant Lab Results:   Additional Information SS#[id number] [place]  Sharin Mons, [place]

## 2019-08-06 NOTE — Progress Notes (Addendum)
HEMATOLOGY-ONCOLOGY PROGRESS NOTE  SUBJECTIVE: Still having pain to his back. Has LE weakness.   PHYSICAL EXAMINATION:  Vitals:   08/06/19 0915 08/06/19 1330  BP: 114/67 98/60  Pulse: (!) 110 (!) 59  Resp: 15 18  Temp: 98 F (36.7 C) 98.7 F (37.1 C)  SpO2: 99% 96%   Filed Weights   07/28/19 1735 08/04/19 0910  Weight: 79.4 kg 79.3 kg    Intake/Output from previous day: 06/17 0701 - 06/18 0700 In: -  Out: 550 [Urine:550]  GENERAL: Chronically ill-appearing, no distress LUNGS: Diminished  HEART: regular rate & rhythm and no murmurs and no lower extremity edema ABDOMEN:abdomen soft, non-tender and normal bowel sounds  NEURO: alert & oriented x 3 with fluent speech, no focal motor/sensory deficits  LABORATORY DATA:  I have reviewed the data as listed CMP Latest Ref Rng & Units 08/06/2019 08/04/2019 08/03/2019  Glucose 70 - 99 mg/dL - 98 145(H)  BUN 8 - 23 mg/dL - 24(H) 21  Creatinine 0.61 - 1.24 mg/dL 1.05 1.07 1.01  Sodium 135 - 145 mmol/L - 138 139  Potassium 3.5 - 5.1 mmol/L - 3.2(L) 4.1  Chloride 98 - 111 mmol/L - 102 104  CO2 22 - 32 mmol/L - 26 24  Calcium 8.9 - 10.3 mg/dL - 9.6 9.9  Total Protein 6.5 - 8.1 g/dL - - -  Total Bilirubin 0.3 - 1.2 mg/dL - - -  Alkaline Phos 38 - 126 U/L - - -  AST 15 - 41 U/L - - -  ALT 0 - 44 U/L - - -    Lab Results  Component Value Date   WBC 18.1 (H) 08/06/2019   HGB 7.7 (L) 08/06/2019   HCT 24.1 (L) 08/06/2019   MCV 94.5 08/06/2019   PLT 423 (H) 08/06/2019   NEUTROABS 10.3 (H) 07/29/2019    CT CHEST W CONTRAST  Result Date: 07/29/2019 CLINICAL DATA:  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 CONTRAST:  127m OMNIPAQUE IOHEXOL 300 MG/ML  SOLN COMPARISON:   CT left hip, 07/29/2019, MR lumbar spine, 07/28/2019 FINDINGS: CT CHEST FINDINGS Cardiovascular: Aortic atherosclerosis. Normal heart size. Three-vessel coronary artery calcifications and stents. No pericardial effusion. Mediastinum/Nodes: No enlarged mediastinal, hilar, or axillary lymph nodes. There is thickening of the esophagus near the gastroesophageal junction, likely a small hiatal hernia (series 5, image 73). Thyroid gland and trachea demonstrate no significant findings. Lungs/Pleura: Mild centrilobular emphysema. Trace bilateral pleural effusions and bandlike scarring or atelectasis of the bilateral lung bases. Evidence of prior left lower lobe wedge resection. Nonspecific 6 mm pulmonary nodule of the right lung base, new compared to remote prior CT dated 05/30/2006 (series 4, image 108). Musculoskeletal: Lytic lesion involving the spine of the right scapula measuring 2.6 x 1.9 cm (series 3, image 1). Subtle lytic lesions involving the left manubrium measuring 1.4 x 1.3 cm (series 3, image 11) and the inner table of the inferior manubrium measuring 1.9 x 1.4 cm (series 3, image 16). There is a lytic osseous lesion involving the left aspect of the T9 and T10 vertebral bodies with and extraosseous soft tissue component, measuring 3.9 x 3.6 cm at the level of T10 (series 3, image 43). CT ABDOMEN PELVIS FINDINGS Hepatobiliary: There are multiple low-attenuation lesions of the liver, the majority  of which appear to be simple cysts and stable compared to remote prior examination dated 05/30/2006, however there are multiple additional, ill-defined hypodense lesions, for example of the liver dome measuring 1.7 x 1.1 cm (series 3, image 46) and of the anterior right lobe of the liver measuring 1.6 x 1.5 cm (series 3, image 63). No gallstones, gallbladder wall thickening, or biliary dilatation. Pancreas: Unremarkable. No pancreatic ductal dilatation or surrounding inflammatory changes. Spleen: Normal in size without  significant abnormality. Adrenals/Urinary Tract: Right adrenal nodule measuring 2.2 x 2.1 cm, enlarged compared to prior examination dated 05/30/2006 although low-attenuation, HU = 9, and very likely a benign adenoma (series 3, image 55). There are bilateral simple cysts of the kidneys. 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 Bladder is unremarkable. Stomach/Bowel: Stomach is within normal limits. Appendix appears normal. No evidence of bowel wall thickening, distention, or inflammatory changes. Sigmoid diverticulosis. Moderate burden of stool balls throughout the colon. Vascular/Lymphatic: Severe aortic atherosclerosis. No enlarged abdominal or pelvic lymph nodes. Reproductive: No mass or other abnormality. Other: No abdominal wall hernia or abnormality. Evidence of prior right inguinal hernia repair. No abdominopelvic ascites. Musculoskeletal: Lytic lesion of the right aspect of the ilium abutting the sacroiliac joint measuring 3.0 x 1.9 cm (series 3, image 101). Previously described lytic lesions involving the left hemi sacrum and adjacent ilium, measuring 5.6 x 4.7 cm (series 3, image 100) and with a pathologic fracture of the left femoral neck, soft tissue mass measuring 5.5 x 3.8 cm (series 3, image 12). Lytic lesion of the right femoral neck with erosion of the posterior cortex measuring 4.0 x 3.5 cm (series 3, image 125). IMPRESSION: 1. Multifocal osseous metastatic disease as detailed above, including a previously identified pathologic fracture of the left femoral neck. There are multiple additional lesions of mechanical significance at risk for pathologic fracture, particularly of the right femoral neck and T9 and T10 vertebral bodies. 2. Ill-defined low-attenuation lesions of the liver consistent with hepatic metastatic disease. 3. There is no candidate primary lesion confidently identified in the chest, abdomen, or pelvis. 4. Thickening of the esophagus near the gastroesophageal junction,  likely a small hiatal hernia, esophageal malignancy less favored and without secondary findings such as adjacent lymphadenopathy. 5. Nonspecific 6 mm pulmonary nodule of the right lung base, new compared to remote prior CT dated 05/30/2006 although generally an unlikely solitary manifestation of pulmonary metastatic disease. Attention on follow-up. 6. Right adrenal nodule, which is enlarged compared to prior examination dated 05/30/2006 although low-attenuation and very likely a benign adenoma. Attention on follow-up. 7. There is a pathologic fracture of the left femoral neck with soft tissue mass measuring 5.5 x 3.8 cm. 8. Trace bilateral pleural effusions without obvious nodularity or other manifestations of pleural metastatic disease. 9. Evidence of prior left lower lobe wedge resection. 10. Other chronic, incidental, and postoperative findings as detailed above. Coronary artery disease. Emphysema (ICD10-J43.9). Aortic Atherosclerosis (ICD10-I70.0). Electronically Signed   By: Eddie Candle M.D.   On: 07/29/2019 08:29   MR LUMBAR SPINE WO CONTRAST  Result Date: 07/28/2019 CLINICAL DATA:  Back pain with left lower extremity weakness EXAM: MRI LUMBAR SPINE WITHOUT CONTRAST TECHNIQUE: Multiplanar, multisequence MR imaging of the lumbar spine was performed. No intravenous contrast was administered. COMPARISON:  04/24/2017 FINDINGS: Some sequences had to be omitted due to SAR restrictions related to the patient's spinal stimulator. Segmentation: Standard. Alignment:  Physiologic. Vertebrae:  L3-5 PLIF.  No acute abnormality. Conus medullaris and cauda equina: Conus extends to  the L1 level. Conus and cauda equina appear normal. Paraspinal and other soft tissues: Negative Disc levels: L1-L2: Normal disc space and facet joints. There is no spinal canal stenosis. No neural foraminal stenosis. L2-L3: Mild disc bulge. Endplate spurring. There is no spinal canal stenosis. Mild bilateral neural foraminal stenosis. L3-L4:  PLIF. There is no spinal canal stenosis. Limited visualization of the neural foramina due to susceptibility effects from spinal hardware. Within that limitation, no visible neural foraminal stenosis. L4-L5: PLIF. There is no spinal canal stenosis. Limited visualization of the neural foramina due to susceptibility effects from spinal hardware. Within that limitation, no visible neural foraminal stenosis. L5-S1: Normal disc space and facet joints. There is no spinal canal stenosis. No neural foraminal stenosis. Visualized sacrum: Normal. IMPRESSION: 1. Scan time had to be limited due to SAR restrictions imposed by the patient's spinal stimulator device. This caused the examination to be truncated and of reduced image quality. 2. L3-5 PLIF without spinal canal stenosis. No visible foraminal stenosis within limitations caused by adjacent hardware. 3. Mild bilateral L2-3 neural foraminal stenosis. Electronically Signed   By: Ulyses Jarred M.D.   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 CONTRAST:  122m OMNIPAQUE IOHEXOL 300 MG/ML  SOLN COMPARISON:  CT left hip, 07/29/2019, MR lumbar spine, 07/28/2019 FINDINGS: CT CHEST FINDINGS Cardiovascular: Aortic atherosclerosis. Normal heart size. Three-vessel coronary artery calcifications and stents. No pericardial effusion. Mediastinum/Nodes: No enlarged mediastinal, hilar, or axillary lymph nodes. There is thickening of the esophagus near the gastroesophageal junction, likely a small hiatal hernia (series 5, image 73). Thyroid gland and trachea demonstrate no significant findings. Lungs/Pleura: Mild centrilobular  emphysema. Trace bilateral pleural effusions and bandlike scarring or atelectasis of the bilateral lung bases. Evidence of prior left lower lobe wedge resection. Nonspecific 6 mm pulmonary nodule of the right lung base, new compared to remote prior CT dated 05/30/2006 (series 4, image 108). Musculoskeletal: Lytic lesion involving the spine of the right scapula measuring 2.6 x 1.9 cm (series 3, image 1). Subtle lytic lesions involving the left manubrium measuring 1.4 x 1.3 cm (series 3, image 11) and the inner table of the inferior manubrium measuring 1.9 x 1.4 cm (series 3, image 16). There is a lytic osseous lesion involving the left aspect of the T9 and T10 vertebral bodies with and extraosseous soft tissue component, measuring 3.9 x 3.6 cm at the level of T10 (series 3, image 43). CT ABDOMEN PELVIS FINDINGS Hepatobiliary: There are multiple low-attenuation lesions of the liver, the majority of which appear to be simple cysts and stable compared to remote prior examination dated 05/30/2006, however there are multiple additional, ill-defined hypodense lesions, for example of the liver dome measuring 1.7 x 1.1 cm (series 3, image 46) and of the anterior right lobe of the liver measuring 1.6 x 1.5 cm (series 3, image 63). No gallstones, gallbladder wall thickening, or biliary dilatation. Pancreas: Unremarkable. No pancreatic ductal dilatation or surrounding inflammatory changes. Spleen: Normal in size without significant abnormality. Adrenals/Urinary Tract: Right adrenal nodule measuring 2.2 x 2.1 cm, enlarged compared to prior examination dated 05/30/2006 although low-attenuation, HU = 9, and very likely a benign adenoma (series 3, image 55). There are  bilateral simple cysts of the kidneys. 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 Bladder is unremarkable. Stomach/Bowel: Stomach is within normal limits. Appendix appears normal. No evidence of bowel wall thickening, distention, or inflammatory changes.  Sigmoid diverticulosis. Moderate burden of stool balls throughout the colon. Vascular/Lymphatic: Severe aortic atherosclerosis. No enlarged abdominal or pelvic lymph nodes. Reproductive: No mass or other abnormality. Other: No abdominal wall hernia or abnormality. Evidence of prior right inguinal hernia repair. No abdominopelvic ascites. Musculoskeletal: Lytic lesion of the right aspect of the ilium abutting the sacroiliac joint measuring 3.0 x 1.9 cm (series 3, image 101). Previously described lytic lesions involving the left hemi sacrum and adjacent ilium, measuring 5.6 x 4.7 cm (series 3, image 100) and with a pathologic fracture of the left femoral neck, soft tissue mass measuring 5.5 x 3.8 cm (series 3, image 12). Lytic lesion of the right femoral neck with erosion of the posterior cortex measuring 4.0 x 3.5 cm (series 3, image 125). IMPRESSION: 1. Multifocal osseous metastatic disease as detailed above, including a previously identified pathologic fracture of the left femoral neck. There are multiple additional lesions of mechanical significance at risk for pathologic fracture, particularly of the right femoral neck and T9 and T10 vertebral bodies. 2. Ill-defined low-attenuation lesions of the liver consistent with hepatic metastatic disease. 3. There is no candidate primary lesion confidently identified in the chest, abdomen, or pelvis. 4. Thickening of the esophagus near the gastroesophageal junction, likely a small hiatal hernia, esophageal malignancy less favored and without secondary findings such as adjacent lymphadenopathy. 5. Nonspecific 6 mm pulmonary nodule of the right lung base, new compared to remote prior CT dated 05/30/2006 although generally an unlikely solitary manifestation of pulmonary metastatic disease. Attention on follow-up. 6. Right adrenal nodule, which is enlarged compared to prior examination dated 05/30/2006 although low-attenuation and very likely a benign adenoma. Attention on  follow-up. 7. There is a pathologic fracture of the left femoral neck with soft tissue mass measuring 5.5 x 3.8 cm. 8. Trace bilateral pleural effusions without obvious nodularity or other manifestations of pleural metastatic disease. 9. Evidence of prior left lower lobe wedge resection. 10. Other chronic, incidental, and postoperative findings as detailed above. Coronary artery disease. Emphysema (ICD10-J43.9). Aortic Atherosclerosis (ICD10-I70.0). Electronically Signed   By: Eddie Candle M.D.   On: 07/29/2019 08:29   CT HIP LEFT WO CONTRAST  Result Date: 07/29/2019 CLINICAL DATA:  Severe hip pain. EXAM: CT OF THE LEFT HIP WITHOUT CONTRAST TECHNIQUE: Multidetector CT imaging of the left hip was performed according to the standard protocol. Multiplanar CT image reconstructions were also generated. COMPARISON:  MRI dated June 2021 FINDINGS: Bones/Joint/Cartilage There is a lytic mass eroding the left sacrum and left iliac bone measuring approximately 6.5 cm. There is a lytic lesion involving the left femoral neck with an associated pathologic fracture. Ligaments Suboptimally assessed by CT. Muscles and Tendons The muscles are unremarkable where visualized. Soft tissues There is a small fat containing left inguinal hernia. There is scattered colonic diverticula without CT evidence for diverticulitis. The bladder is distended. IMPRESSION: 1. Lytic lesion involving the left femoral neck with associated pathologic fracture. 2. There is an additional lytic lesion in the left iliac bone and left sacrum. 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 3. Diverticulosis without CT evidence for diverticulitis. 4. Distended urinary bladder. 5. Fat containing left inguinal hernia. These results will be called to the ordering clinician or representative by the Radiologist Assistant, and communication documented in the  PACS or [employer]. Electronically Signed   By: Constance Holster M.D.   On:  07/29/2019 03:27   CT FEMUR LEFT WO CONTRAST  Result Date: 07/30/2019 CLINICAL DATA:  Pathologic fracture of the left femur EXAM: CT OF THE LOWER LEFT EXTREMITY WITHOUT CONTRAST TECHNIQUE: Multidetector CT imaging of the lower left extremity was performed according to the standard protocol. COMPARISON:  CT and x-ray 07/29/2019 FINDINGS: Bones/Joint/Cartilage Expansile lytic mass centered at the left femoral neck with comminuted pathologic fracture involving the basicervical and intertrochanteric aspects of the femur. Fracture is mildly impacted. No significant displacement or angulation. Left femoral neck mass with prominent soft tissue component emanating from the posterior cortex in total measures approximately 5.6 x 4.2 x 4.5 cm (series 2, image 30; series 5, image 50). Known lytic lesion of the left ilium is partially visualized at the edge of the field of view (series 1, image 3), more fully characterized on CT abdomen-pelvis 07/29/2019. No additional lytic or sclerotic bony lesions are seen within the field of view. Mild joint space narrowing of the left hip. No dislocation. No evidence of femoral head avascular necrosis. Ligaments Suboptimally assessed by CT. Muscles and Tendons Soft tissue mass results in mass effect on the adjacent left quadratus femoris muscle without obvious muscular invasion, although evaluation is limited with noncontrast CT. Musculotendinous structures appear intact within the limitations of CT. Soft tissues No additional soft tissue masses. No fluid collection. Sigmoid diverticulosis is noted. IMPRESSION: 1. Expansile lytic mass centered at the left femoral neck with comminuted pathologic fracture involving the basicervical and intertrochanteric aspects of the femur. Fracture is mildly impacted. No significant displacement or angulation. 2. Known lytic lesion of the left ilium is partially visualized at the edge of the field of view, more fully characterized on CT abdomen-pelvis  07/29/2019. Electronically Signed   By: Davina Poke D.O.   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 COMPARISON:  CT 07/29/2019 FINDINGS: Bones/Joint/Cartilage Lytic mildly expansile mass centered within the intertrochanteric aspect of the proximal right femur measuring 4.6 x 3.4 x 5.7 cm (series 3, image 33; series 9, image 72). There is significant cortical breakthrough of the posterior and superior cortex of the femoral neck (series 3, image 32; series 7, image 66). Mass occupies the full diameter of the inferior femoral neck. Small extraosseous soft tissue component along the posterior femoral neck is suggested with mild mass effect on the traversing quadratus femoris muscle. No complete pathologic fracture is evident at this time. Known lytic mass in the posterior right ilium was not included within the field of view. Ill-defined area of lucency within the central aspect of the distal femoral metaphysis measuring 1.7 x 1.1 x 1.1 cm (series 3, image 155; series 7, image 62) is nonspecific and may reflect a area of prominent marrow fat. A small lytic lesion not excluded. No additional areas of lytic or sclerotic bone lesion is identified. Ligaments Suboptimally assessed by CT. Muscles and Tendons No acute myotendinous abnormality. Soft tissues No soft tissue fluid collection. IMPRESSION: 1. Lytic mildly expansile mass centered within the intertrochanteric aspect of the proximal right femur measuring up to 5.7 cm with significant cortical breakthrough of the posterior and superior cortex of the femoral neck. Small extraosseous  soft tissue component along the posterior femoral neck is suggested with mild mass effect on the  traversing quadratus femoris muscle. This lesion is at high risk of pathologic fracture. 2. Ill-defined 1.7 cm area of lucency within the central aspect of the distal femoral metaphysis is nonspecific and may reflect a area of prominent marrow fat. A small lytic lesion not excluded. MRI could be performed for confirmation, as clinically indicated. Electronically Signed   By: Davina Poke D.O.   On: 07/30/2019 08:36   DG C-Arm 1-60 Min  Result Date: 08/02/2019 CLINICAL DATA:  Right intramedullary femoral IM nail for impending femoral fracture. EXAM: RIGHT FEMUR 2 VIEWS; DG C-ARM 1-60 MIN COMPARISON:  Preoperative imaging. FINDINGS: Six fluoroscopic spot views obtained in the operating room in frontal and lateral projections. Placement of intramedullary nail with trans trochanteric and distal locking screws. Known osseous metastatic disease is not well visualized on fluoroscopic spot views. Fluoroscopy time 1 minutes 43 seconds. Dose 7.30 mGy. IMPRESSION: Fluoroscopic spot views during right femoral intramedullary nail placement. Electronically Signed   By: Keith Rake M.D.   On: 08/02/2019 15:30   DG HIP OPERATIVE UNILAT W OR W/O PELVIS LEFT  Result Date: 07/30/2019 CLINICAL DATA:  Left hemiarthroplasty. EXAM: OPERATIVE LEFT HIP (WITH PELVIS IF PERFORMED) TECHNIQUE: Fluoroscopic spot image(s) were submitted for interpretation post-operatively. COMPARISON:  Radiograph yesterday. FINDINGS: Three spot images obtained in the operating room during left hip arthroplasty. Fluoroscopy time not provided. IMPRESSION: Three spot images obtained in the operating room during left hip arthroplasty. Electronically Signed   By: Keith Rake M.D.   On: 07/30/2019 18:25   DG HIP UNILAT WITH PELVIS 2-3 VIEWS LEFT  Result Date: 07/29/2019 CLINICAL DATA:  Recent falls EXAM: DG HIP (WITH OR WITHOUT PELVIS) 2-3V LEFT COMPARISON:  None. FINDINGS: Frontal pelvis as well as frontal and lateral left hip images were  obtained. There is a comminuted fracture of the basicervical and intertrochanteric regions the proximal left femur. There is slight displacement of fracture fragments at the basicervical femoral neck region. There is inferior avulsion of the lesser trochanter. No other fractures are evident. No dislocation. There is mild symmetric narrowing of each hip joint. Postoperative change noted at L4, L5, S1. A stimulator device is present over the right iliac crest. There are surgical clips in the right inguinal region. IMPRESSION: Comminuted fracture of the proximal left femur with basicervical intertrochanteric components. Avulsion lesser trochanter. Mild displacement of fracture fragments along the lateral portion of the basicervical portion of the fracture. No other fractures. No dislocation. Mild symmetric narrowing each hip joint. Electronically Signed   By: Lowella Grip III M.D.   On: 07/29/2019 13:17   DG FEMUR MIN 2 VIEWS LEFT  Result Date: 07/29/2019 CLINICAL DATA:  Proximal femur fracture following fall EXAM: LEFT FEMUR 2 VIEWS COMPARISON:  Pelvis and left hip radiographs July 29, 2019 FINDINGS: Frontal and lateral views were obtained. There is a comminuted basicervical and intertrochanteric portions of the proximal left femur. No inferior displacement of the lesser trochanter. No more distal fractures are evident. No dislocation. There is mild narrowing of the left hip joint. The knee joint region appears normal. No joint effusion. Foci of arterial vascular calcification noted. IMPRESSION: Comminuted fracture proximal left femur. Note that there is avulsion with inferior displacement of the lesser trochanter on the left. No new more distal fractures are evident. No dislocation. Mild narrowing left hip joint. Foci of arterial vascular calcification noted. Electronically Signed   By: Gwyndolyn Saxon  Jasmine December III M.D.   On: 07/29/2019 13:15   DG FEMUR, MIN 2 VIEWS RIGHT  Result Date: 08/02/2019 CLINICAL DATA:   Right intramedullary femoral IM nail for impending femoral fracture. EXAM: RIGHT FEMUR 2 VIEWS; DG C-ARM 1-60 MIN COMPARISON:  Preoperative imaging. FINDINGS: Six fluoroscopic spot views obtained in the operating room in frontal and lateral projections. Placement of intramedullary nail with trans trochanteric and distal locking screws. Known osseous metastatic disease is not well visualized on fluoroscopic spot views. Fluoroscopy time 1 minutes 43 seconds. Dose 7.30 mGy. IMPRESSION: Fluoroscopic spot views during right femoral intramedullary nail placement. Electronically Signed   By: Keith Rake M.D.   On: 08/02/2019 15:30    ASSESSMENT AND PLAN: 1.  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  Pathologic fracture nonspecific 6 mm right base  nodule-new from April 2008 of the left femoral neck             -Status post left hip hemiarthroplasty on 07/30/2019 3.  Impending fracture of the right femur             -Status post intramedullary fixation of the right femur on 08/02/2019 4.  Hypertension 5.  COPD 6.  Paroxysmal atrial fibrillation 7.  CAD 8.  Hyperlipidemia  9.  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 Ogren appears stable.  He continues to have pain.  MS Contin was started earlier today and he remains on Dilaudid for breakthrough pain.  He is scheduled to begin palliative radiation on Monday, 08/09/2019.  Tissue has been sent for PD-L1 testing and Foundation One testing.  Plan is for discharge to SNF when medically stable.  The patient and his wife would like to have further discussions regarding treatment options such as immunotherapy based on these results.  Recommendations: 1.  Proceed with palliative radiation as scheduled. 2.  We again discussed treatment options such as immunotherapy versus chemotherapy for treatment of his cancer versus a comfort based approach/hospice.  The patient would like to engage in more discussion once results of the PD-L1 testing have returned.  He does not seem terribly interested in chemotherapy but would consider immunotherapy if he has a candidate. 3.  Continue current pain medications and titrate as needed.  We will need to discontinue dexamethasone if he opts to pursue immunotherapy. 4.  He will also be considered for bisphosphonate therapy as an outpatient. 5.  We will arrange for outpatient follow-up at the cancer center following discharge for more detailed  discussion of his treatment options.   LOS: 8 days   Mikey Bussing, DNP, AGPCNP-BC, AOCNP 08/06/19 I had further discussion with Mr. Kahl today regarding the diagnosis, prognosis, and treatment options.  We are waiting on results from PD-L1 testing to decide on the likelihood of a response to immunotherapy. He has  pain related to bone metastases, surgery, and a pathologic fracture.  I would continue narcotic analgesics for pain.  I will check on him 08/09/2019 if he remains in the hospital.  Outpatient follow-up will be scheduled at the Cancer center.  Please call oncology as needed over the weekend.

## 2019-08-06 NOTE — Progress Notes (Signed)
° ° °  Subjective:  Patient reports pain as mild to moderate.  Denies N/V/CP/SOB. C/o back pain.  Objective:   VITALS:   Vitals:   08/05/19 1927 08/06/19 0331 08/06/19 0907 08/06/19 0915  BP: 121/80 109/72 136/80 114/67  Pulse: 100 98 (!) 140 (!) 110  Resp: 18 18 16 15   Temp: 98.2 F (36.8 C) 98.2 F (36.8 C) 98.1 F (36.7 C) 98 F (36.7 C)  TempSrc: Oral Oral Oral Oral  SpO2: 95% 94% 96% 99%  Weight:      Height:        NAD ABD soft Sensation intact distally Intact pulses distally Dorsiflexion/Plantar flexion intact Incision: dressing C/D/I   Lab Results  Component Value Date   WBC 18.1 (H) 08/06/2019   HGB 7.7 (L) 08/06/2019   HCT 24.1 (L) 08/06/2019   MCV 94.5 08/06/2019   PLT 423 (H) 08/06/2019   BMET    Component Value Date/Time   NA 138 08/04/2019 0810   NA 144 11/09/2011 0419   K 3.2 (L) 08/04/2019 0810   K 4.0 11/09/2011 0419   CL 102 08/04/2019 0810   CL 109 (H) 11/09/2011 0419   CO2 26 08/04/2019 0810   CO2 26 11/09/2011 0419   GLUCOSE 98 08/04/2019 0810   GLUCOSE 93 11/09/2011 0419   BUN 24 (H) 08/04/2019 0810   BUN 16 11/09/2011 0419   CREATININE 1.05 08/06/2019 0328   CREATININE 1.02 06/06/2016 0000   CREATININE 1.00 11/26/2011 1108   CALCIUM 9.6 08/04/2019 0810   CALCIUM 8.7 11/09/2011 0419   GFRNONAA >60 08/06/2019 0328   GFRNONAA >60 11/09/2011 0419   GFRNONAA >60 06/26/2010 0944   GFRAA >60 08/06/2019 0328   GFRAA >60 11/09/2011 0419   GFRAA >60 06/26/2010 0944     Assessment/Plan: 4 Days Post-Op   Principal Problem:   Intractable low back pain Active Problems:   Mixed hyperlipidemia   Essential hypertension   COPD (chronic obstructive pulmonary disease) (HCC)   AF (paroxysmal atrial fibrillation) (HCC)   Gout   Benign prostatic hyperplasia with lower urinary tract symptoms   Chronic diastolic CHF (congestive heart failure) (HCC)   Coronary artery disease involving native coronary artery of native heart   Pathological  fracture in neoplastic disease, hip, unspecified, initial encounter for fracture [facility])   Palliative care by specialist   Goals of care, counseling/discussion   DNR (do not resuscitate)  Path report: metastatic lung CA  WBAT with walker L posterior hip precautions DVT ppx: can switch lovenox to xarelto when ok with hospitalist, SCDs, TEDS PO pain control PT/OT Dispo: d/c planning, will need rad onc for XRT to B/L hips & L ilium in at least 2 weeks to allow wound healing    Greg Hughes Greg Hughes 08/06/2019, 12:44 PM   Rod Can, MD [phone] Greg Hughes is now Greg Hughes   [place] [address], [place], [place] [address] Phone: [phone] [email or web address] Facebook   [employer]

## 2019-08-06 NOTE — TOC Progression Note (Signed)
Transition of Care [facility]) - Progression Note    Patient Details  Name: SALMAAN PATCHIN MRN: [id number] Date of Birth: 10/08/1940  Transition of Care [facility]) CM/SW Contact  Sharin Mons, RN Phone Number: [phone] 08/06/2019, 12:53 PM  Clinical Narrative:    NCM spoke with pt and wife [facility] regarding disposition needs. Both stated they are declining hospice care for now and would like to proceed with SNF/Rehab. placement.   Patient reports without  preference for SNF. NCM discussed insurance authorization process and provided Medicare SNF ratings list. Wife states will f/u with hospice care post SNF placement. No further questions reported at this time. NCM to continue to follow and assist with discharge planning needs.    Expected Discharge Plan: [place] Barriers to Discharge: Continued Medical Work up  Expected Discharge Plan and Services Expected Discharge Plan: [place] In-house Referral: Clinical Social Work Discharge Planning Services: CM Consult Post Acute Care Choice: Home Health, Durable Medical Equipment (vs. SNF) Living arrangements for the past 2 months: Single Family Home                                       Social Determinants of Health (SDOH) Interventions    Readmission Risk Interventions Readmission Risk Prevention Plan 07/31/2019  Transportation Screening Complete  PCP or Specialist Appt within 5-7 Days Not Complete  Not Complete comments disposition pending  Home Care Screening Complete  Medication Review (RN CM) Referral to Pharmacy  Some recent data might be hidden

## 2019-08-06 NOTE — Progress Notes (Signed)
Physical Therapy Treatment Patient Details Name: Greg Hughes MRN: [id number] DOB: 1940/10/14 Today's Date: 08/06/2019    History of Present Illness Patient is a 79 year old male S/P left hemi arthroplasty follwing a pathological fracture on the left. 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 He underwent prophylactic R IM nail for this on 08/02/19.  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  Pt required assistance to mobilize to edge of bed.  Pt required assistance to move from bed and to progress gt distance.  Pt continues to benefit from skilled rehab in a post acute rehab center.  He required motivation to progress activity this session.    Follow Up Recommendations  SNF;Supervision/Assistance - 24 hour     Equipment Recommendations  None recommended by PT    Recommendations for Other Services       Precautions / Restrictions Precautions Precautions: Posterior Hip;Fall Precaution Booklet Issued: Yes (comment) Precaution Comments: reviewed posterior hip precautions with wife and pt Restrictions Weight Bearing Restrictions: Yes RLE Weight Bearing: Weight bearing as tolerated LLE Weight Bearing: Weight bearing as tolerated    Mobility  Bed Mobility Overal bed mobility: Needs Assistance Bed Mobility: Supine to Sit     Supine to sit: Mod assist     General bed mobility comments: Mod assistance to advance LEs to edge of bed and to elevate trunk into sitting.  Transfers Overall transfer level: Needs assistance Equipment used: Rolling walker (2 wheeled) Transfers: Sit to/from Stand Sit to Stand: Mod assist;From elevated surface         General transfer comment: Cues for hand placement to and from seated surface.  Assistance to boost into standing and shift weight  forward.  Ambulation/Gait Ambulation/Gait assistance: Mod assist;+2 safety/equipment Gait Distance (Feet): 14 Feet Assistive device: Rolling walker (2 wheeled) Gait Pattern/deviations: Step-to pattern;Antalgic;Shuffle;Trunk flexed;Decreased stride length;Decreased step length - left;Decreased stance time - right     General Gait Details: Pt required cues for upper trunk control and sequencing.  Pt required assistance to keep pathway of RW and weight shift to progress steps forward.   Stairs             Wheelchair Mobility    Modified Rankin (Stroke Patients Only)       Balance Overall balance assessment: Needs assistance Sitting-balance support: Feet supported;Bilateral upper extremity supported Sitting balance-Leahy Scale: Fair Sitting balance - Comments: posterior lean but no LOB with vc's     Standing balance-Leahy Scale: Poor Standing balance comment: UE support and external assist needed                            Cognition Arousal/Alertness: Awake/alert Behavior During Therapy: WFL for tasks assessed/performed Overall Cognitive Status: Impaired/Different from baseline Area of Impairment: Memory;Following commands;Problem solving;Safety/judgement                 Orientation Level: Disoriented to;Time   Memory: Decreased short-term memory;Decreased recall of precautions Following Commands: Follows one step commands with increased time Safety/Judgement: Decreased awareness of safety;Decreased awareness of deficits   Problem Solving: Slow processing;Requires verbal cues;Decreased initiation;Difficulty sequencing;Requires tactile cues General Comments: confusion has improved but pt with STM deficits as well as need for slow instructions and increased time to follow      Exercises General  Exercises - Lower Extremity Ankle Circles/Pumps: AROM;Both;20 reps;Supine Quad Sets: AROM;Both;10 reps;Seated Heel Slides: AROM;Both;10  reps;AAROM;Supine Hip ABduction/ADduction: AAROM;Both;10 reps;Supine    General Comments        Pertinent Vitals/Pain Pain Assessment: Faces Faces Pain Scale: Hurts whole lot Pain Location: back Pain Descriptors / Indicators: Aching;Grimacing;Guarding;Sharp;Shooting Pain Intervention(s): Monitored during session;Repositioned    Home Living                      Prior Function            PT Goals (current goals can now be found in the care plan section) Acute Rehab PT Goals Patient Stated Goal: to go home  Potential to Achieve Goals: Fair Progress towards PT goals: Progressing toward goals    Frequency    Min 3X/week      PT Plan Current plan remains appropriate    Co-evaluation              AM-PAC PT "6 Clicks" Mobility   Outcome Measure  Help needed turning from your back to your side while in a flat bed without using bedrails?: A Lot Help needed moving from lying on your back to sitting on the side of a flat bed without using bedrails?: A Lot Help needed moving to and from a bed to a chair (including a wheelchair)?: A Lot Help needed standing up from a chair using your arms (e.g., wheelchair or bedside chair)?: A Lot Help needed to walk in hospital room?: A Lot Help needed climbing 3-5 steps with a railing? : A Lot 6 Click Score: 12    End of Session Equipment Utilized During Treatment: Gait belt Activity Tolerance: Patient tolerated treatment well Patient left: in chair;with call bell/phone within reach;with family/visitor present Nurse Communication: Mobility status PT Visit Diagnosis: Unsteadiness on feet (R26.81);Pain;Difficulty in walking, not elsewhere classified (R26.2);Muscle weakness (generalized) (M62.81) Pain - Right/Left:  (both) Pain - part of body: Hip     Time: 4158-3094 PT Time Calculation (min) (ACUTE ONLY): 33 min  Charges:  $Gait Training: 8-22 mins $Therapeutic Exercise: 8-22 mins                     Erasmo Leventhal ,  PTA Acute Rehabilitation Services Pager [phone] Office [phone]     Kent Braunschweig Eli Hose 08/06/2019, 3:48 PM

## 2019-08-06 NOTE — Discharge Instructions (Signed)
Dr. Rod Can Joint Replacement Specialist [facility] [address] [place], [place] [address] [phone]   TOTAL HIP REPLACEMENT POSTOPERATIVE DIRECTIONS    Hip Rehabilitation, Guidelines Following Surgery   WEIGHT BEARING Weight bearing as tolerated with assist device (walker, cane, etc) as directed, use it as long as suggested by your surgeon or therapist, typically at least 4-6 weeks.  The results of a hip operation are greatly improved after range of motion and muscle strengthening exercises. Follow all safety measures which are given to protect your hip. If any of these exercises cause increased pain or swelling in your joint, decrease the amount until you are comfortable again. Then slowly increase the exercises. Call your caregiver if you have problems or questions.   HOME CARE INSTRUCTIONS  Most of the following instructions are designed to prevent the dislocation of your new hip.  Remove items at home which could result in a fall. This includes throw rugs or furniture in walking pathways.  Continue medications as instructed at time of discharge.  You may have some home medications which will be placed on hold until you complete the course of blood thinner medication.  You may start showering once you are discharged home. Do not remove your dressing. Do not put on socks or shoes without following the instructions of your caregivers.   Sit on chairs with arms. Use the chair arms to help push yourself up when arising.  Arrange for the use of a toilet seat elevator so you are not sitting low.   Walk with walker as instructed.  You may resume a sexual relationship in one month or when given the OK by your caregiver.  Use walker as long as suggested by your caregivers.  You may put full weight on your legs and walk as much as is comfortable. Avoid periods of inactivity such as sitting longer than an hour when not asleep. This helps prevent  blood clots.  You may return to work once you are cleared by Engineer, production.  Do not drive a car for 6 weeks or until released by your surgeon.  Do not drive while taking narcotics.  Wear elastic stockings for two weeks following surgery during the day but you may remove then at night.  Make sure you keep all of your appointments after your operation with all of your doctors and caregivers. You should call the office at the above phone number and make an appointment for approximately two weeks after the date of your surgery. Please pick up a stool softener and laxative for home use as long as you are requiring pain medications.  ICE to the affected hip every three hours for 30 minutes at a time and then as needed for pain and swelling. Continue to use ice on the hip for pain and swelling from surgery. You may notice swelling that will progress down to the foot and ankle.  This is normal after surgery.  Elevate the leg when you are not up walking on it.   It is important for you to complete the blood thinner medication as prescribed by your doctor.  Continue to use the breathing machine which will help keep your temperature down.  It is common for your temperature to cycle up and down following surgery, especially at night when you are not up moving around and exerting yourself.  The breathing machine keeps your lungs expanded and your temperature down.  RANGE OF MOTION AND STRENGTHENING EXERCISES  These exercises  are designed to help you keep full movement of your hip joint. Follow your caregiver's or physical therapist's instructions. Perform all exercises about fifteen times, three times per day or as directed. Exercise both hips, even if you have had only one joint replacement. These exercises can be done on a training (exercise) mat, on the floor, on a table or on a bed. Use whatever works the best and is most comfortable for you. Use music or television while you are exercising so that the exercises  are a pleasant break in your day. This will make your life better with the exercises acting as a break in routine you can look forward to.  Lying on your back, slowly slide your foot toward your buttocks, raising your knee up off the floor. Then slowly slide your foot back down until your leg is straight again.  Lying on your back spread your legs as far apart as you can without causing discomfort.  Lying on your side, raise your upper leg and foot straight up from the floor as far as is comfortable. Slowly lower the leg and repeat.  Lying on your back, tighten up the muscle in the front of your thigh (quadriceps muscles). You can do this by keeping your leg straight and trying to raise your heel off the floor. This helps strengthen the largest muscle supporting your knee.  Lying on your back, tighten up the muscles of your buttocks both with the legs straight and with the knee bent at a comfortable angle while keeping your heel on the floor.   SKILLED REHAB INSTRUCTIONS: If the patient is transferred to a skilled rehab facility following release from the hospital, a list of the current medications will be sent to the facility for the patient to continue.  When discharged from the skilled rehab facility, please have the facility set up the patient's [place] prior to being released. Also, the skilled facility will be responsible for providing the patient with their medications at time of release from the facility to include their pain medication and their blood thinner medication. If the patient is still at the rehab facility at time of the two week follow up appointment, the skilled rehab facility will also need to assist the patient in arranging follow up appointment in our office and any transportation needs.  MAKE SURE YOU:  Understand these instructions.  Will watch your condition.  Will get help right away if you are not doing well or get worse.  Pick up stool softner and  laxative for home use following surgery while on pain medications. Do not remove your dressing. The dressing is waterproof--it is OK to take showers. Continue to use ice for pain and swelling after surgery. Do not use any lotions or creams on the incision until instructed by your surgeon. Total Hip Protocol. Posterior hip precautions on LEFT

## 2019-08-06 NOTE — Progress Notes (Addendum)
Palliative Medicine Inpatient Follow Up Note   Reason for consult:  Pain Management  HPI:  Per intake H&P --> 79 year old male with CAD, BPH, OSA, paroxysmal A. fib on Xarelto, recent hospitalization at [facility] in January 2021 for a lung nodule status post resection (I am unable to find pathology reports), came to the hospital with severe low back pain, hip pain and inability to walk. CT revealed multiple metastatic osteolytic lesions and a pathological fractureto the left hip. Patient underwent a left hip hemiarthroplasty as well as prophylactic IM nailing of the right femur. Today is going to [facility] for radiation simulation.   Palliative care was asked to get involved to aid in symptom relief.   Today's Discussion (08/06/2019): Chart reviewed. Met with Ronalee Belts at bedside. He said that he is still in pain, we talked about initiating the long acting morphine which he was agreeable to. We also added lidocaine patches to his back and increased the decadron. Patient reports that he is hopeful these interventions will help him. Assured him that our goals are to help him reach a level of comfort. Updated patients bedside RN thereafter.   Discussed the importance of continued conversation with family and their  medical providers regarding overall plan of care and treatment options, ensuring decisions are within the context of the patients values and GOCs.  "Hard Choices for [employer]" booklet.   Questions and concerns addressed   Vital Signs Vitals:   08/06/19 0907 08/06/19 0915  BP: 136/80 114/67  Pulse: (!) 140 (!) 110  Resp: 16 15  Temp: 98.1 F (36.7 C) 98 F (36.7 C)  SpO2: 96% 99%    Intake/Output Summary (Last 24 hours) at 08/06/2019 1134 Last data filed at 08/06/2019 0900 Gross per 24 hour  Intake 240 ml  Output 951 ml  Net -711 ml   Last Weight  Most recent update: 08/04/2019  9:11 AM   Weight  79.3 kg (174 lb 13.2 oz)           SUMMARY OF  RECOMMENDATIONS DNAR/DNI  MOST completed and placed in the chart  Comprehensive symptom management as below  Consult for Hospice post SNF  Code Status/Advance Care Planning: DNAR/DNI  Symptom Management: Intractable Lower Back Pain: Metastatic Bone Pain:                 - Tylenol 1g PO TID                 - Decadron 64m PO BID                 - Continue PRN dilaudid only give IV if orals are ineffective                 - Calcitonin 1 spray alternating nares daily                  - Continue lyrica                 - Start MS Contin 158mPO BID    - Lidoderm patches to back  - voltaren gel to back when lidoderm is not on  Constipation:                 - Continue miralax 17g PO QDay                 - senna 1 tab PO BID  Urinary Retention:                 -  Bladder scan Qshift  Insomnia:                 - Trazodone 72m PO QHS PRN  Hiccups:                 - Metoclopramide ordered though have changed indication  Ppx:                  - Protonix 448mPO QDay  Time Spent: 35 Greater than 50% of the time was spent in counseling and coordination of care ______________________________________________________________________________________ [facility] Team Cell Phone: [phone] utilize secure chat with additional questions, if there is no response within 30 minutes please call the above phone number  Palliative Medicine Team providers are available by phone from 7am to 7pm daily and can be reached through the team cell phone.  Should this patient require assistance outside of these hours, please call the patient's attending physician.

## 2019-08-06 NOTE — Progress Notes (Signed)
   08/06/19 0907  Assess: MEWS Score  Temp 98.1 F (36.7 C)  BP 136/80  Pulse Rate (!) 140 (pt is toileting)  Resp 16  Level of Consciousness Alert  SpO2 96 %  O2 Device Room Air  Assess: MEWS Score  MEWS Temp 0  MEWS Systolic 0  MEWS Pulse 3  MEWS RR 0  MEWS LOC 0  MEWS Score 3  MEWS Score Color Yellow  Assess: if the MEWS score is Yellow or Red  Were vital signs taken at a resting state? No  Focused Assessment Documented focused assessment  Early Detection of Sepsis Score *See Row Information* Low  MEWS guidelines implemented *See Row Information* No, vital signs rechecked  Treat  MEWS Interventions Other (Comment) (V/S rechecked post toileting)   Patient was toileting during time vital signs were taken.  Vital signs re-checked and returned to green MEWS.

## 2019-08-07 DIAGNOSIS — G893 Neoplasm related pain (acute) (chronic): Secondary | ICD-10-CM | POA: Insufficient documentation

## 2019-08-07 DIAGNOSIS — Z66 Do not resuscitate: Secondary | ICD-10-CM

## 2019-08-07 DIAGNOSIS — Z7189 Other specified counseling: Secondary | ICD-10-CM

## 2019-08-07 DIAGNOSIS — C7951 Secondary malignant neoplasm of bone: Secondary | ICD-10-CM | POA: Insufficient documentation

## 2019-08-07 DIAGNOSIS — C3432 Malignant neoplasm of lower lobe, left bronchus or lung: Secondary | ICD-10-CM | POA: Insufficient documentation

## 2019-08-07 LAB — CBC
HCT: 22.9 % — ABNORMAL LOW (ref 39.0–52.0)
Hemoglobin: 7.4 g/dL — ABNORMAL LOW (ref 13.0–17.0)
MCH: 30.5 pg (ref 26.0–34.0)
MCHC: 32.3 g/dL (ref 30.0–36.0)
MCV: 94.2 fL (ref 80.0–100.0)
Platelets: 469 10*3/uL — ABNORMAL HIGH (ref 150–400)
RBC: 2.43 MIL/uL — ABNORMAL LOW (ref 4.22–5.81)
RDW: 14.6 % (ref 11.5–15.5)
WBC: 23.5 10*3/uL — ABNORMAL HIGH (ref 4.0–10.5)
nRBC: 0.2 % (ref 0.0–0.2)

## 2019-08-07 NOTE — Progress Notes (Signed)
Palliative Medicine Inpatient Follow Up Note   Reason for consult:  Pain Management  HPI:  Per intake H&P --> 79 year old male with CAD, BPH, OSA, paroxysmal A. fib on Xarelto, recent hospitalization at [facility] in January 2021 for a lung nodule status post resection (I am unable to find pathology reports), came to the hospital with severe low back pain, hip pain and inability to walk. CT revealed multiple metastatic osteolytic lesions and a pathological fractureto the left hip. Patient underwent a left hip hemiarthroplasty as well as prophylactic IM nailing of the right femur. Today is going to [facility] for radiation simulation.   Palliative care was asked to get involved to aid in symptom relief.   Today's Discussion (08/07/2019): Chart reviewed. Met with Ronalee Belts at bedside. He shares that his pain is, "a lot better". He said that he barely feels it now. We discussed continuing with the current regiment and if ned be we could consider increasing the MS Contin based upon what his PRN requirements are. He shares that this sounds like a good plan. We discussed his plan for skilled nursing placement and hospice thereafter.   Discussed the importance of continued conversation with family and their  medical providers regarding overall plan of care and treatment options, ensuring decisions are within the context of the patients values and GOCs.  Questions and concerns addressed   Vital Signs Vitals:   08/07/19 0413 08/07/19 0852  BP: 128/84 116/74  Pulse: 82 98  Resp: 18 17  Temp: 98.2 F (36.8 C) 98 F (36.7 C)  SpO2: 93% 91%    Intake/Output Summary (Last 24 hours) at 08/07/2019 1610 Last data filed at 08/07/2019 0600 Gross per 24 hour  Intake 720 ml  Output 550 ml  Net 170 ml   Last Weight  Most recent update: 08/04/2019  9:11 AM   Weight  79.3 kg (174 lb 13.2 oz)           SUMMARY OF RECOMMENDATIONS DNAR/DNI  MOST completed and placed in the chart  Comprehensive symptom  management as below we will make no changes to the regiment today though if requiring an increase in PRNs we will titrate the MS Contin  Consult for Hospice post SNF  Code Status/Advance Care Planning: DNAR/DNI  Symptom Management: Intractable Lower Back Pain: Metastatic Bone Pain:                 - Tylenol 1g PO TID                 - Decadron 69m PO BID                 - Continue PRN dilaudid only give IV if orals are ineffective                 - Calcitonin 1 spray alternating nares daily                  - Continue lyrica                 - MS Contin 131mPO BID   - Lidoderm patches to back   - Voltaren gel to back when lidoderm is not on  Constipation:                 - Continue Miralax 17g PO QDay                 - senna 1 tab PO BID  Urinary  Retention:                 - Bladder scan Qshift  Insomnia:                 - Trazodone 75m PO QHS PRN  Hiccups:                 - Metoclopramide ordered though have changed indication  Ppx:                  - Protonix 463mPO QDay  Time Spent: 25 Greater than 50% of the time was spent in counseling and coordination of care ______________________________________________________________________________________ [facility] [place] Team Cell Phone: [phone] utilize secure chat with additional questions, if there is no response within 30 minutes please call the above phone number  Palliative Medicine Team providers are available by phone from 7am to 7pm daily and can be reached through the team cell phone.  Should this patient require assistance outside of these hours, please call the patient's attending physician.

## 2019-08-07 NOTE — Care Management (Cosign Needed)
    Durable Medical Equipment  (From admission, onward)         Start     Ordered   08/06/19 1723  For home use only DME lightweight manual wheelchair with seat cushion  Once       Comments: Patient suffers from hip fracture which impairs their ability to perform daily activities in the home.  A walking aid will not resolve  issue with performing activities of daily living. A wheelchair will allow patient to safely perform daily activities. Patient is not able to propel themselves in the home using a standard weight wheelchair due to weakness. Patient can self propel in the lightweight wheelchair. Length of need 6 months.  Accessories: elevating leg rests (ELRs), wheel locks, extensions and anti-tippers.   08/06/19 1723   08/06/19 1722  For home use only DME Hospital bed  Once       Question Answer Comment  Length of Need Lifetime   The above medical condition requires: Patient requires the ability to reposition frequently   Head must be elevated greater than: 30 degrees   Bed type Semi-electric      08/06/19 1721   08/06/19 1722  For home use only DME Bedside commode  Once       Question:  Patient needs a bedside commode to treat with the following condition  Answer:  Metastatic cancer ([place])   08/06/19 1721

## 2019-08-07 NOTE — Progress Notes (Signed)
PROGRESS NOTE    Greg Hughes  RXV:[id number] DOB: 1941/02/15 DOA: 07/28/2019 PCP: Tonia Ghent, MD    Chief Complaint  Patient presents with  . Hip Pain    Brief Narrative:   79 year old male with CAD, BPH, OSA, paroxysmal A. fib on Xarelto, recent hospitalization at [facility] in January 2021 for a lung nodule status post resection (I am unable to find pathology reports), came to the hospital with severe low back pain, hip pain and inability to walk. Several weeks ago while he was mowing the lawn felt like his hip was popping out of socket, followed by progressively worsening pain. He was seen in the ED on midnight and x-ray was unremarkable. Pain continued and came back to the hospital. CT scan now concern for multiple metastatic osteolytic lesions and a pathological fracture to the left hip.  He is now status post left hip hemiarthroplasty as well as prophylactic IM nailing of the right femur. Palliative care consulted, and on further discussions , he was transitioned to DNR and hospice referral.  Oncology consulted, recommend outpatient follow up for chemotherapy vs immunotherapy vs comfort based approach. Currently waiting for the pathology results. Meanwhile Radiation oncology consulted to proceed with palliaitive radiation.  Discussed in detail with pt and his wife at bedside, who requested to see if they can be discharged home with home PT, OT, AID , with hospital bed, bedside commode and wheelchair. TOC made aware and the orders have been placed.  Pt seen and examined at bedside, reports he is feeling better without any  New complaints.     Assessment & Plan:   Principal Problem:   Intractable low back pain Active Problems:   Mixed hyperlipidemia   Essential hypertension   COPD (chronic obstructive pulmonary disease) (HCC)   AF (paroxysmal atrial fibrillation) (HCC)   Gout   Benign prostatic hyperplasia with lower urinary tract symptoms   Chronic diastolic CHF  (congestive heart failure) (HCC)   Coronary artery disease involving native coronary artery of native heart   Pathological fracture in neoplastic disease, hip, unspecified, initial encounter for fracture [facility])   Palliative care by specialist   Goals of care, counseling/discussion   DNR (do not resuscitate)  Left femoral neck pathological fracture probably secondary to the lytic lesion involving the left femoral neck with associated pathological fracture, additional lytic lesion in the left iliac bone and left sacrum concerning for metastatic disease. Orthopedics consulted and he underwent left hip hemiarthroplasty, posterior approach and open biopsy of the left femoral bone lesion on 07/30/2019.  CT scan showed extensive metastatic disease in the right femur, and he underwent prophylactic intramedullary fixation of the right femur. PT evaluation recommending SNF at this time. Pain control.  Patient and his wife want to see if they can be discharged home with home PT, aide with hospital bed, commode and wheelchair.  TOC made aware.    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 Patient had a history of lung nodule s/p resection. Imaging of the chest abdomen and pelvis shows extensive metastatic disease to the bone, liver and there is 6 mm pulmonary nodule in the right lung base. Results from the biopsy are pending. Oncology consulted and  recommend outpatient follow up for chemotherapy vs immunotherapy vs comfort based approach. Currently waiting for the pathology results. Meanwhile Radiation oncology consulted to proceed with palliaitive radiation.  Pain control with Tylenol, Decadron 6 mg p.o. twice daily, as needed Dilaudid, calcitonin 1 spray, Lyrica, MS Contin  15 mg p.o. twice daily, Lidoderm patch, Voltaren gel as needed. In view of multiple medical problems, extensive metastatic disease, deconditioning, palliative care consulted, and he was transitioned to DNR, and  hospice referral placed.    Essential hypertension Well-controlled blood pressure parameters.   COPD Stable.  Paroxysmal atrial fibrillation  rate controlled on metoprolol. Continue with home medications. Continue with xarelto for anti coagulation.     Coronary artery disease Continue with statin and beta-blocker. Patient currently denies any chest pain or shortness of breath.   Hyperlipidemia Continue with statin   Constipation Continue with MiraLAX.   Historically, patient is also status post C5-C7 ACDF and L3-L5 posterior spinal and interbody  fusion and spinal cord simulator   Bph: Continue with flomax.   Hypokalemia Replaced.    Leukocytosis:  Probably reactive and possibly  from Decadron  Constipation;  Continue with senna, miralax.    DVT prophylaxis:  Xarelto.  Code Status: FULL CODE.  Family Communication: None at bedside Disposition:   Status is: Inpatient  Remains inpatient appropriate because:Unsafe d/c plan   Dispo: The patient is from: Home              Anticipated d/c is to: SNF versus home with home PT              Anticipated d/c date is: 2 days              Patient currently is medically stable to d/c.       Consultants:   Orthopedics.  Palliative care consult  Oncology  Radiation oncology.   Procedures: s/p IM nailing of the right femur.  Antimicrobials: NONE.   Subjective Patient currently denies any chest pain shortness of breath nausea vomiting or abdominal pain Leg pain has improved  Objective: Vitals:   08/06/19 1942 08/07/19 0413 08/07/19 0852 08/07/19 1305  BP: 108/70 128/84 116/74 114/68  Pulse: (!) 108 82 98 (!) 107  Resp: 17 18 17 17   Temp: 98.2 F (36.8 C) 98.2 F (36.8 C) 98 F (36.7 C) 98.2 F (36.8 C)  TempSrc: Oral  Oral Oral  SpO2: 95% 93% 91% 96%  Weight:      Height:        Intake/Output Summary (Last 24 hours) at 08/07/2019 1405 Last data filed at 08/07/2019 0600 Gross per 24 hour   Intake 600 ml  Output 550 ml  Net 50 ml   Filed Weights   07/28/19 1735 08/04/19 0910  Weight: 79.4 kg 79.3 kg    Examination:  General exam: Alert and comfortable, no distress noted today. Respiratory system: Clear to auscultation bilaterally, no wheezing or rhonchi Cardiovascular system: S1-S2 heard, tachycardic, no JVD, no pedal edema Gastrointestinal system: Abdomen is soft, nontender, nondistended, bowel sounds normal.  Central nervous system: Alert, oriented and responding to questions and able to move all extremities.  Extremities painful range of movements of the left lower extremity, no pedal edema or cyanosis Skin: No rashes or ulcers Psychiatry: Appropriate   Data Reviewed: I have personally reviewed following labs and imaging studies  CBC: Recent Labs  Lab 08/03/19 0209 08/04/19 0810 08/05/19 0335 08/06/19 0328 08/07/19 0345  WBC 12.3* 17.7* 17.5* 18.1* 23.5*  HGB 7.0* 7.6* 7.5* 7.7* 7.4*  HCT 20.9* 23.2* 23.2* 24.1* 22.9*  MCV 92.1 92.1 93.2 94.5 94.2  PLT 237 370 387 423* 469*    Basic Metabolic Panel: Recent Labs  Lab 08/01/19 0755 08/02/19 0146 08/03/19 0209 08/04/19 0810 08/06/19 0328  NA  137 137 139 138 141  K 3.7 3.7 4.1 3.2* 4.9  CL 103 101 104 102 106  CO2 24 26 24 26 26   GLUCOSE 132* 104* 145* 98 135*  BUN 19 18 21  24* 23  CREATININE 1.08 1.06 1.01 1.07 1.05  1.05  CALCIUM 9.7 9.8 9.9 9.6 9.8  MG  --  1.9  --   --   --     GFR: Estimated Creatinine Clearance: 58.9 mL/min (by C-G formula based on SCr of 1.05 mg/dL).  Liver Function Tests: No results for input(s): AST, ALT, ALKPHOS, BILITOT, PROT, ALBUMIN in the last 168 hours.  CBG: No results for input(s): GLUCAP in the last 168 hours.   Recent Results (from the past 240 hour(s))  SARS Coronavirus 2 by RT PCR (hospital order, performed in [facility] hospital lab) Nasopharyngeal Nasopharyngeal Swab     Status: None   Collection Time: 07/29/19 12:27 AM   Specimen:  Nasopharyngeal Swab  Result Value Ref Range Status   SARS Coronavirus 2 NEGATIVE NEGATIVE Final    Comment: (NOTE) SARS-CoV-2 target nucleic acids are NOT DETECTED.  The SARS-CoV-2 RNA is generally detectable in upper and lower respiratory specimens during the acute phase of infection. The lowest concentration of SARS-CoV-2 viral copies this assay can detect is 250 copies / mL. A negative result does not preclude SARS-CoV-2 infection and should not be used as the sole basis for treatment or other patient management decisions.  A negative result may occur with improper specimen collection / handling, submission of specimen other than nasopharyngeal swab, presence of viral mutation(s) within the areas targeted by this assay, and inadequate number of viral copies (<250 copies / mL). A negative result must be combined with clinical observations, patient history, and epidemiological information.  Fact Sheet for Patients:   StrictlyIdeas.no  Fact Sheet for Healthcare Providers: BankingDealers.co.za  This test is not yet approved or  cleared by the [place] FDA and has been authorized for detection and/or diagnosis of SARS-CoV-2 by FDA under an Emergency Use Authorization (EUA).  This EUA will remain in effect (meaning this test can be used) for the duration of the COVID-19 declaration under Section 564(b)(1) of the Act, 21 U.S.C. section 360bbb-3(b)(1), unless the authorization is terminated or revoked sooner.  Performed at [facility], [place] [address], [place], [place] [address]   Surgical pcr screen     Status: None   Collection Time: 07/30/19 10:32 AM   Specimen: Nasal Mucosa; Nasal Swab  Result Value Ref Range Status   MRSA, PCR NEGATIVE NEGATIVE Final   Staphylococcus aureus NEGATIVE NEGATIVE Final    Comment: (NOTE) The Xpert SA Assay (FDA approved for NASAL specimens in patients 79 years of age and older), is one  component of a comprehensive surveillance program. It is not intended to diagnose infection nor to guide or monitor treatment. Performed at [facility], [place] [address] [place], [place] [address]          Radiology Studies: No results found.      Scheduled Meds: . acetaminophen  1,000 mg Oral TID  . allopurinol  300 mg Oral Daily  . calcitonin (salmon)  1 spray Alternating Nares Daily  . Chlorhexidine Gluconate Cloth  6 each Topical Daily  . dexamethasone  6 mg Oral BID  . diclofenac Sodium  4 g Topical QID  . diltiazem  180 mg Oral Daily  . finasteride  5 mg Oral Daily  . lidocaine  2 patch Transdermal Q24H  . metoprolol succinate  50 mg Oral Daily  . morphine  15 mg Oral Q12H  . pantoprazole  40 mg Oral Daily  . polyethylene glycol  17 g Oral Daily  . pregabalin  25 mg Oral BID  . rivaroxaban  20 mg Oral Q supper  . senna  1 tablet Oral BID  . simvastatin  10 mg Oral q1800  . tamsulosin  0.8 mg Oral Daily   Continuous Infusions: . lactated ringers 10 mL/hr at 08/02/19 1132     LOS: 9 days       Hosie Poisson, MD [facility]   To contact the attending provider between 7A-7P or the covering provider during after hours 7P-7A, please log into the web site [email or web address] and access using universal [place] password for that web site. If you do not have the password, please call the hospital operator.  08/07/2019, 2:05 PM

## 2019-08-07 NOTE — TOC Progression Note (Signed)
Transition of Care [facility]) - Progression Note    Patient Details  Name: Greg Hughes MRN: [id number] Date of Birth: 02-03-1941  Transition of Care [facility] CM/SW Contact  Claudie Leach, RN 08/07/2019, 5:43 PM  Clinical Narrative:    Spoke with patient and wife regarding dc plan.  Patient and wife confirm that they want to go home with [facility] and need hospital bed, WC, and 3n1.  They confirm that they do not want home hospice at this time, as previously stated, and do not want to go to a SNF.  Patient plans to start radiation appointments on Monday afternoon and will need ambulance transport home and to radiation appointments.    Referral accepted for [facility] by [facility] with Alvis Lemmings.    Venia Carbon with [facility] updated on plan.  [facility] will arrange close f/u with palliative services.  Orders for DME sent to [place] with [employer] with intent for DME to be delivered Sunday 6/20.   Expected Discharge Plan: [place] Barriers to Discharge: Continued Medical Work up  Expected Discharge Plan and Services Expected Discharge Plan: [place] In-house Referral: Clinical Social Work Discharge Planning Services: CM Consult Post Acute Care Choice: Home Health, Durable Medical Equipment (vs. SNF) Living arrangements for the past 2 months: [place]                 DME Arranged: 3-N-1, Hospital bed, Lightweight manual wheelchair with seat cushion DME Agency: [employer] Date DME Agency Contacted: 08/07/19 Time DME Agency Contacted: 65 Representative spoke with at DME Agency: [place]: PT, OT, RN, Nurse's Aide [place] Agency: [place] Date [place]: 08/07/19 Time [place]: [place] Representative spoke with at [place]: [place] ([place]) Interventions    Readmission Risk Interventions Readmission Risk Prevention Plan 07/31/2019  Transportation Screening Complete  PCP or Specialist Appt within 5-7  Days Not Complete  Not Complete comments disposition pending  Home Care Screening Complete  Medication Review (RN CM) Referral to Pharmacy  Some recent data might be hidden

## 2019-08-08 LAB — CBC
HCT: 24.6 % — ABNORMAL LOW (ref 39.0–52.0)
Hemoglobin: 8 g/dL — ABNORMAL LOW (ref 13.0–17.0)
MCH: 31.3 pg (ref 26.0–34.0)
MCHC: 32.5 g/dL (ref 30.0–36.0)
MCV: 96.1 fL (ref 80.0–100.0)
Platelets: 534 10*3/uL — ABNORMAL HIGH (ref 150–400)
RBC: 2.56 MIL/uL — ABNORMAL LOW (ref 4.22–5.81)
RDW: 15.4 % (ref 11.5–15.5)
WBC: 25.7 10*3/uL — ABNORMAL HIGH (ref 4.0–10.5)
nRBC: 0.4 % — ABNORMAL HIGH (ref 0.0–0.2)

## 2019-08-08 NOTE — Plan of Care (Signed)

## 2019-08-08 NOTE — Progress Notes (Signed)
 Palliative Medicine Inpatient Follow Up Note   Reason for consult:  Pain Management  HPI:  Per intake H&P --> 79-year-old male with CAD, BPH, OSA, paroxysmal A. fib on Xarelto, recent hospitalization at [facility] in January 2021 for a lung nodule status post resection (I am unable to find pathology reports), came to the hospital with severe low back pain, hip pain and inability to walk. CT revealed multiple metastatic osteolytic lesions and a pathological fractureto the left hip. Patient underwent a left hip hemiarthroplasty as well as prophylactic IM nailing of the right femur. Today is going to [facility] for radiation simulation.   Palliative care was asked to get involved to aid in symptom relief.   Today's Discussion (08/08/2019): Chart reviewed. Met with Greg Hughes at bedside. He states that he feels well today he said his pain has improved tremendously. Per discussion with nursing he did not require any PRN dilaudid overnight. We discussed continuing with the same care in terms of symptom management which he was in agreement with.   Discussed the importance of continued conversation with family and their  medical providers regarding overall plan of care and treatment options, ensuring decisions are within the context of the patients values and GOCs.  Questions and concerns addressed   Vital Signs Vitals:   08/08/19 0300 08/08/19 0814  BP: 107/69 125/67  Pulse: 62 81  Resp: 17 15  Temp: 98.5 F (36.9 C) 98.1 F (36.7 C)  SpO2: 98% 94%    Intake/Output Summary (Last 24 hours) at 08/08/2019 1240 Last data filed at 08/08/2019 1000 Gross per 24 hour  Intake 0 ml  Output 850 ml  Net -850 ml   Last Weight  Most recent update: 08/04/2019  9:11 AM   Weight  79.3 kg (174 lb 13.2 oz)           SUMMARY OF RECOMMENDATIONS DNAR/DNI  MOST completed and placed in the chart  Comprehensive symptom management as below we will make no changes to the regiment today though if requiring an  increase in PRNs we will titrate the MS Contin  Consult for Hospice post SNF  Code Status/Advance Care Planning: DNAR/DNI  Symptom Management: Intractable Lower Back Pain: Metastatic Bone Pain:                 - Tylenol 1g PO TID                 - Decadron 6mg PO BID                 - Continue PRN dilaudid only give IV if orals are ineffective                 - Calcitonin 1 spray alternating nares daily                  - Continue lyrica                 - MS Contin 15mg PO BID   - Lidoderm patches to back   - Voltaren gel to back when lidoderm is not on  Constipation:                 - Continue Miralax 17g PO QDay                 - senna 1 tab PO BID  Urinary Retention:                 -   Bladder scan Qshift  Insomnia:                 - Trazodone 50mg PO QHS PRN  Hiccups:                 - Metoclopramide ordered though have changed indication  Ppx:                  - Protonix 40mg PO QDay  Time Spent: 15 Greater than 50% of the time was spent in counseling and coordination of care ______________________________________________________________________________________   [place] Palliative Medicine Team Team Cell Phone: [phone] Please utilize secure chat with additional questions, if there is no response within 30 minutes please call the above phone number  Palliative Medicine Team providers are available by phone from 7am to 7pm daily and can be reached through the team cell phone.  Should this patient require assistance outside of these hours, please call the patient's attending physician.     

## 2019-08-08 NOTE — Progress Notes (Signed)
Manufacturing engineer [facility])  Discussed with [facility] about discharge disposition for Greg Hughes.  Pt wife initially wanted to go home with hospice, then seek facility placement for rehab, now would like to go home with home health and palliative.   [facility] will plan to follow up when they get home with palliative services and provide them with 24/7 contact information for hospice support, as the patient may not do well with home health and need hospice instead.  Thank you, Venia Carbon RN, BSN, [place] [facility] Liaison

## 2019-08-08 NOTE — Progress Notes (Signed)
PROGRESS NOTE    Greg Hughes  WCH:[id number] DOB: 1940-07-13 DOA: 07/28/2019 PCP: Tonia Ghent, MD    Chief Complaint  Patient presents with  . Hip Pain    Brief Narrative:   79 year old male with CAD, BPH, OSA, paroxysmal A. fib on Xarelto, recent hospitalization at [facility] in January 2021 for a lung nodule status post resection (I am unable to find pathology reports), came to the hospital with severe low back pain, hip pain and inability to walk. Several weeks ago while he was mowing the lawn felt like his hip was popping out of socket, followed by progressively worsening pain. He was seen in the ED on midnight and x-ray was unremarkable. Pain continued and came back to the hospital. CT scan now concern for multiple metastatic osteolytic lesions and a pathological fracture to the left hip.  He is now status post left hip hemiarthroplasty as well as prophylactic IM nailing of the right femur. Palliative care consulted, and on further discussions , he was transitioned to DNR and hospice referral.  Oncology consulted, recommend outpatient follow up for chemotherapy vs immunotherapy vs comfort based approach. Currently waiting for the pathology results. Meanwhile Radiation oncology consulted to proceed with palliaitive radiation.  Discussed in detail with pt and his wife at bedside, who requested to see if they can be discharged home with home PT, OT, AID , with hospital bed, bedside commode and wheelchair. TOC made aware and the orders have been placed.  Patient seen and examined at bedside no new complaints at this time son at bedside answered all his questions currently waiting for hospital bed to be delivered and the equipment to be delivered so he can be discharged in the morning after radiation therapy    Assessment & Plan:   Principal Problem:   Intractable low back pain Active Problems:   Mixed hyperlipidemia   Essential hypertension   COPD (chronic obstructive pulmonary  disease) (HCC)   AF (paroxysmal atrial fibrillation) (HCC)   Gout   Benign prostatic hyperplasia with lower urinary tract symptoms   Chronic diastolic CHF (congestive heart failure) ([place])   Coronary artery disease involving native coronary artery of native heart   Pathological fracture in neoplastic disease, hip, unspecified, initial encounter for fracture [facility])   Palliative care by specialist   Goals of care, counseling/discussion   DNR (do not resuscitate)  Left femoral neck pathological fracture probably secondary to the lytic lesion involving the left femoral neck with associated pathological fracture, additional lytic lesion in the left iliac bone and left sacrum concerning for metastatic disease. Orthopedics consulted and he underwent left hip hemiarthroplasty, posterior approach and open biopsy of the left femoral bone lesion on 07/30/2019.  CT scan showed extensive metastatic disease in the right femur, and he underwent prophylactic intramedullary fixation of the right femur. PT evaluation recommending SNF at this time. Patient reports his pain is well controlled at this time Patient and his wife would like to be discharged home with home health after the equipment has been delivered probably in the morning after radiation therapy.   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 Patient had a history of lung nodule s/p resection. Imaging of the chest abdomen and pelvis shows extensive metastatic disease to the bone, liver and there is 6 mm pulmonary nodule in the right lung base. Results from the biopsy are pending. Oncology consulted and  recommend outpatient follow up for chemotherapy vs immunotherapy vs comfort based approach. Currently waiting for  the pathology results. Meanwhile Radiation oncology consulted to proceed with palliaitive radiation.  Pain control with Tylenol, Decadron 6 mg p.o. twice daily, as needed Dilaudid, calcitonin 1 spray, Lyrica, MS  Contin 15 mg p.o. twice daily, Lidoderm patch, Voltaren gel as needed. In view of multiple medical problems, extensive metastatic disease, deconditioning, palliative care consulted, and he was transitioned to DNR, and hospice referral placed.    Essential hypertension Well-controlled blood pressure parameters   COPD Stable.  Paroxysmal atrial fibrillation  rate controlled on metoprolol. Continue with home medications.  Continue with Xarelto for anticoagulation   Coronary artery disease Continue with statin and beta-blocker. Patient currently denies any chest pain or shortness of   Hyperlipidemia Continue with statin   Constipation Continue with MiraLAX.   Historically, patient is also status post C5-C7 ACDF and L3-L5 posterior spinal and interbody  fusion and spinal cord simulator   Bph: Continue with flomax.   Hypokalemia Replaced.    Leukocytosis:  Probably reactive and possibly  from Decadron. Steroids might need to be discontinued if patient wants to opt for immunotherapy  Constipation;  Continue with senna, miralax.    DVT prophylaxis:  Xarelto.  Code Status: FULL CODE.  Family Communication: Son at bedside Disposition:   Status is: Inpatient  Remains inpatient appropriate because:Unsafe d/c plan   Dispo: The patient is from: Home              Anticipated d/c is to: Home              Anticipated d/c date is: 1 day              Patient currently is medically stable to d/c.       Consultants:   Orthopedics.  Palliative care consult  Oncology  Radiation oncology.   Procedures: s/p IM nailing of the right femur.  Antimicrobials: NONE.   Subjective Patient denies any chest pain shortness of breath, nausea or vomiting and he reports he would like to be discharged tomorrow after radiation treatment Objective: Vitals:   08/07/19 1305 08/07/19 1933 08/08/19 0300 08/08/19 0814  BP: 114/68 111/74 107/69 125/67  Pulse: (!) 107 69 62 81    Resp: 17 18 17 15   Temp: 98.2 F (36.8 C) 98.7 F (37.1 C) 98.5 F (36.9 C) 98.1 F (36.7 C)  TempSrc: Oral Oral Oral Oral  SpO2: 96% 95% 98% 94%  Weight:      Height:        Intake/Output Summary (Last 24 hours) at 08/08/2019 1442 Last data filed at 08/08/2019 1335 Gross per 24 hour  Intake 0 ml  Output 1550 ml  Net -1550 ml   Filed Weights   07/28/19 1735 08/04/19 0910  Weight: 79.4 kg 79.3 kg    Examination:  General exam: Alert comfortable laying in bed, no distress noted. Respiratory system: Clear to auscultation bilaterally, no wheezing or rhonchi Cardiovascular system: S1-S2 heard, regular rate rhythm, no JVD, no pedal edema Gastrointestinal system: Abdomen is soft, nontender, bowel sounds normal Central nervous system: Alert, oriented and grossly nonfocal.   Extremities painful range of movements of the left lower extremity no pedal edema Skin: No rashes seen Psychiatry: Mood is appropriate   Data Reviewed: I have personally reviewed following labs and imaging studies  CBC: Recent Labs  Lab 08/04/19 0810 08/05/19 0335 08/06/19 0328 08/07/19 0345 08/08/19 0731  WBC 17.7* 17.5* 18.1* 23.5* 25.7*  HGB 7.6* 7.5* 7.7* 7.4* 8.0*  HCT 23.2* 23.2* 24.1* 22.9* 24.6*  MCV 92.1 93.2 94.5 94.2 96.1  PLT 370 387 423* 469* 534*    Basic Metabolic Panel: Recent Labs  Lab 08/02/19 0146 08/03/19 0209 08/04/19 0810 08/06/19 0328  NA 137 139 138 141  K 3.7 4.1 3.2* 4.9  CL 101 104 102 106  CO2 26 24 26 26   GLUCOSE 104* 145* 98 135*  BUN 18 21 24* 23  CREATININE 1.06 1.01 1.07 1.05  1.05  CALCIUM 9.8 9.9 9.6 9.8  MG 1.9  --   --   --     GFR: Estimated Creatinine Clearance: 58.9 mL/min (by C-G formula based on SCr of 1.05 mg/dL).  Liver Function Tests: No results for input(s): AST, ALT, ALKPHOS, BILITOT, PROT, ALBUMIN in the last 168 hours.  CBG: No results for input(s): GLUCAP in the last 168 hours.   Recent Results (from the past 240 hour(s))   Surgical pcr screen     Status: None   Collection Time: 07/30/19 10:32 AM   Specimen: Nasal Mucosa; Nasal Swab  Result Value Ref Range Status   MRSA, PCR NEGATIVE NEGATIVE Final   Staphylococcus aureus NEGATIVE NEGATIVE Final    Comment: (NOTE) The Xpert SA Assay (FDA approved for NASAL specimens in patients 42 years of age and older), is one component of a comprehensive surveillance program. It is not intended to diagnose infection nor to guide or monitor treatment. Performed at [facility], [place] [address], [place], [place] [address]          Radiology Studies: No results found.      Scheduled Meds: . acetaminophen  1,000 mg Oral TID  . allopurinol  300 mg Oral Daily  . calcitonin (sal[place])  1 spray Alternating Nares Daily  . Chlorhexidine Gluconate Cloth  6 each Topical Daily  . dexamethasone  6 mg Oral BID  . diclofenac Sodium  4 g Topical QID  . diltiazem  180 mg Oral Daily  . finasteride  5 mg Oral Daily  . lidocaine  2 patch Transdermal Q24H  . metoprolol succinate  50 mg Oral Daily  . morphine  15 mg Oral Q12H  . pantoprazole  40 mg Oral Daily  . polyethylene glycol  17 g Oral Daily  . pregabalin  25 mg Oral BID  . rivaroxaban  20 mg Oral Q supper  . senna  1 tablet Oral BID  . simvastatin  10 mg Oral q1800  . tamsulosin  0.8 mg Oral Daily   Continuous Infusions: . lactated ringers 10 mL/hr at 08/02/19 1132     LOS: 10 days       Hosie Poisson, MD [facility]   To contact the attending provider between 7A-7P or the covering provider during after hours 7P-7A, please log into the web site [email or web address] and access using universal [place] password for that web site. If you do not have the password, please call the hospital operator.  08/08/2019, 2:42 PM

## 2019-08-08 NOTE — Plan of Care (Signed)
  Problem: Education: Goal: Knowledge of General Education information will improve Description: Including pain rating scale, medication(s)/side effects and non-pharmacologic comfort measures Outcome: Progressing   Problem: Nutrition: Goal: Adequate nutrition will be maintained Outcome: Progressing   Problem: Coping: Goal: Level of anxiety will decrease Outcome: Progressing   Problem: Pain Managment: Goal: General experience of comfort will improve Outcome: Progressing   Problem: Skin Integrity: Goal: Risk for impaired skin integrity will decrease Outcome: Progressing

## 2019-08-08 NOTE — Plan of Care (Signed)

## 2019-08-09 ENCOUNTER — Telehealth: Payer: Self-pay

## 2019-08-09 ENCOUNTER — Encounter: Payer: Self-pay | Admitting: *Deleted

## 2019-08-09 ENCOUNTER — Ambulatory Visit
Admit: 2019-08-09 | Discharge: 2019-08-09 | Disposition: A | Discharge status: Home / Self Care | Payer: Medicare HMO | Attending: Radiation Oncology | Admitting: Radiation Oncology

## 2019-08-09 DIAGNOSIS — Z515 Encounter for palliative care: Secondary | ICD-10-CM

## 2019-08-09 DIAGNOSIS — M84452A Pathological fracture, left femur, initial encounter for fracture: Secondary | ICD-10-CM

## 2019-08-09 DIAGNOSIS — M84559A Pathological fracture in neoplastic disease, hip, unspecified, initial encounter for fracture: Secondary | ICD-10-CM

## 2019-08-09 LAB — CBC
HCT: 23.8 % — ABNORMAL LOW (ref 39.0–52.0)
Hemoglobin: 7.6 g/dL — ABNORMAL LOW (ref 13.0–17.0)
MCH: 30.8 pg (ref 26.0–34.0)
MCHC: 31.9 g/dL (ref 30.0–36.0)
MCV: 96.4 fL (ref 80.0–100.0)
Platelets: 515 10*3/uL — ABNORMAL HIGH (ref 150–400)
RBC: 2.47 MIL/uL — ABNORMAL LOW (ref 4.22–5.81)
RDW: 15.6 % — ABNORMAL HIGH (ref 11.5–15.5)
WBC: 23.3 10*3/uL — ABNORMAL HIGH (ref 4.0–10.5)
nRBC: 0.4 % — ABNORMAL HIGH (ref 0.0–0.2)

## 2019-08-09 MED ORDER — MORPHINE SULFATE ER 15 MG PO TBCR: 15.0000 mg | EXTENDED_RELEASE_TABLET | Freq: Two times a day (BID) | ORAL | 0 refills | Status: DC

## 2019-08-09 MED ORDER — TRAZODONE HCL 50 MG PO TABS: 50.0000 mg | ORAL_TABLET | Freq: Every evening | ORAL | 0 refills | Status: DC | PRN

## 2019-08-09 MED ORDER — SENNA 8.6 MG PO TABS: 1.0000 | ORAL_TABLET | Freq: Two times a day (BID) | ORAL | 0 refills | Status: AC

## 2019-08-09 MED ORDER — DEXAMETHASONE 6 MG PO TABS: 6.0000 mg | ORAL_TABLET | Freq: Two times a day (BID) | ORAL | 1 refills | Status: AC

## 2019-08-09 MED ORDER — CALCITONIN (SALMON) 200 UNIT/ACT NA SOLN: 1.0000 | Freq: Every day | NASAL | 12 refills | Status: AC

## 2019-08-09 MED ORDER — PANTOPRAZOLE SODIUM 40 MG PO TBEC: 40.0000 mg | DELAYED_RELEASE_TABLET | Freq: Every day | ORAL | 0 refills | Status: AC

## 2019-08-09 MED ORDER — POLYETHYLENE GLYCOL 3350 17 G PO PACK: 17.0000 g | PACK | Freq: Every day | ORAL | 0 refills | Status: AC

## 2019-08-09 MED ORDER — ACETAMINOPHEN 500 MG PO TABS: 1000.0000 mg | ORAL_TABLET | Freq: Three times a day (TID) | ORAL | 0 refills | Status: AC

## 2019-08-09 MED ORDER — ALUM & MAG HYDROXIDE-SIMETH 200-200-20 MG/5ML PO SUSP: 15.0000 mL | Freq: Four times a day (QID) | ORAL | 0 refills | Status: AC | PRN

## 2019-08-09 MED ORDER — DICLOFENAC SODIUM 1 % EX GEL: 4.0000 g | Freq: Four times a day (QID) | CUTANEOUS | 0 refills | Status: AC

## 2019-08-09 NOTE — Telephone Encounter (Signed)
Telephone call to schedule palliative care visit.  RN left message for wife requesting call back to schedule palliative care visit.

## 2019-08-09 NOTE — Discharge Summary (Signed)
Physician Discharge Summary  Greg Hughes GEX:[id number] DOB: [birth year] DOA: 07/28/2019  PCP: Tonia Ghent, MD  Admit date: 07/28/2019 Discharge date: 08/09/2019  Admitted From: Home.  Disposition:  Home   Recommendations for Outpatient Follow-up:  1. Follow up with PCP in 1-2 weeks 2. Please obtain BMP/CBC in one week 3. Please follow up with radiation Oncology as recommended.  4. Please follow up with Oncology as scheduled.   Home Health: yes  Discharge Condition: stable.  CODE STATUS: DNR. Diet recommendation: Heart Healthy   Brief/Interim Summary: 79 year old male with CAD, BPH, OSA, paroxysmal A. fib on Xarelto, recent hospitalization at [facility] in January 2021 for a lung nodule status post resection (I am unable to find pathology reports), came to the hospital with severe low back pain, hip pain and inability to walk. Several weeks ago while he was mowing the lawn felt like his hip was popping out of socket, followed by progressively worsening pain. He was seen in the ED on midnight and x-ray was unremarkable. Pain continued and came back to the hospital. CT scan now concern for multiple metastatic osteolytic lesions and a pathological fractureto the left hip. He is now status post left hip hemiarthroplasty as well as prophylactic IM nailing of the right femur. Palliative care consulted, and on further discussions , he was transitioned to DNR and hospice referral.  Oncology consulted, recommend outpatient follow up for chemotherapy vs immunotherapy vs comfort based approach. Currently waiting for the pathology results. Meanwhile Radiation oncology consulted to proceed with palliaitive radiation.  Discussed in detail with pt and his wife at bedside, who requested to see if they can be discharged home with home PT, OT, AID , with hospital bed, bedside commode and wheelchair. TOC made aware and the orders have been placed.  Patient seen and examined at bedside no new complaints  at this time son at bedside answered all his questions currently waiting for hospital bed to be delivered and the equipment to be delivered so he can be discharged in the morning after radiation therapy  Discharge Diagnoses:  Principal Problem:   Intractable low back pain Active Problems:   Mixed hyperlipidemia   Essential hypertension   COPD (chronic obstructive pulmonary disease) (HCC)   AF (paroxysmal atrial fibrillation) (HCC)   Gout   Benign prostatic hyperplasia with lower urinary tract symptoms   Chronic diastolic CHF (congestive heart failure) ([place])   Coronary artery disease involving native coronary artery of native heart   Pathological fracture in neoplastic disease, hip, unspecified, initial encounter for fracture [facility])   Palliative care by specialist   Goals of care, counseling/discussion   DNR (do not resuscitate)   Pathologic fracture of femoral neck, left, initial encounter ([place])  Left femoral neck pathological fracture probably secondary to the lytic lesion involving the left femoral neck with associated pathological fracture, additional lytic lesion in the left iliac bone and left sacrum concerning for metastatic disease. Orthopedics consulted and he underwent left hip hemiarthroplasty, posterior approach and open biopsy of the left femoral bone lesion on 07/30/2019.  CT scan showed extensive metastatic disease in the right femur, and he underwent prophylactic intramedullary fixation of the right femur. PT evaluation recommending SNF at this time. Patient reports his pain is well controlled at this time Patient and his wife would like to be discharged home with home health after the equipment has been delivered probably in the morning after radiation therapy.   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 Patient  had a history of lung nodule s/p resection. Imaging of the chest abdomen and pelvis shows extensive metastatic disease to the bone,  liver and there is 6 mm pulmonary nodule in the right lung base. Results from the biopsy are pending. Oncology consulted and  recommend outpatient follow up for chemotherapy vs immunotherapy vs comfort based approach. Currently waiting for the pathology results. Meanwhile Radiation oncology consulted to proceed with palliaitive radiation.  Pain control with Tylenol, Decadron 6 mg p.o. twice daily, as needed Dilaudid, calcitonin 1 spray, Lyrica, MS Contin 15 mg p.o. twice daily, Voltaren gel as needed. In view of multiple medical problems, extensive metastatic disease, deconditioning, palliative care consulted, and he was transitioned to DNR, and hospice referral placed.    Essential hypertension Well-controlled blood pressure parameters   COPD Stable.  Paroxysmal atrial fibrillation  rate controlled on metoprolol. Continue with home medications.  Continue with Xarelto for anticoagulation   Coronary artery disease Continue with statin and beta-blocker. Patient currently denies any chest pain or shortness of BREATH.    Hyperlipidemia Continue with statin   Constipation Continue with MiraLAX.   Historically, patient is also status post C5-C7 ACDF and L3-L5 posterior spinal and interbody fusion and spinal cord simulator   Bph: Continue with flomax.   Hypokalemia Replaced.    Leukocytosis:  Probably reactive and possibly  from Decadron. Steroids might need to be discontinued if patient wants to opt for immunotherapy  Constipation;  Continue with senna, miralax.     Discharge Instructions  Discharge Instructions    Diet - low sodium heart healthy   Complete by: As directed    Discharge wound care:   Complete by: As directed    Dressing changes as per orthopedics recommendations.   Increase activity slowly   Complete by: As directed      Allergies as of 08/09/2019   No Known Allergies     Medication List    STOP taking these medications    baclofen 10 MG tablet Commonly known as: LIORESAL   predniSONE 20 MG tablet Commonly known as: DELTASONE   terazosin 10 MG capsule Commonly known as: HYTRIN   traMADol 50 MG tablet Commonly known as: ULTRAM     TAKE these medications   acetaminophen 500 MG tablet Commonly known as: TYLENOL Take 2 tablets (1,000 mg total) by mouth 3 (three) times daily.   allopurinol 300 MG tablet Commonly known as: ZYLOPRIM TAKE 1 TABLET BY MOUTH ONCE A DAY   alum & mag hydroxide-simeth 200-200-20 MG/5ML suspension Commonly known as: MAALOX/MYLANTA Take 15 mLs by mouth every 6 (six) hours as needed for indigestion or heartburn.   calcitonin (salmon) 200 UNIT/ACT nasal spray Commonly known as: MIACALCIN/FORTICAL Place 1 spray into alternate nostrils daily. Start taking on: August 10, 2019   dexamethasone 6 MG tablet Commonly known as: DECADRON Take 1 tablet (6 mg total) by mouth 2 (two) times daily.   diclofenac Sodium 1 % Gel Commonly known as: VOLTAREN Apply 4 g topically 4 (four) times daily.   diltiazem 180 MG 24 hr capsule Commonly known as: CARDIZEM CD TAKE 1 CAPSULE BY MOUTH ONCE DAILY   diltiazem 30 MG tablet Commonly known as: CARDIZEM TAKE 1 TABLET BY MOUTH 3 TIMES A DAY AS NEEDED FOR BREAKTHROUGH ATRIAL FIB What changed:   how much to take  how to take this  when to take this  reasons to take this  additional instructions   finasteride 5 MG tablet Commonly known as: PROSCAR TAKE  1 TABLET BY MOUTH ONCE A DAY   HYDROmorphone 2 MG tablet Commonly known as: DILAUDID Take 1 tablet (2 mg total) by mouth every 3 (three) hours as needed for severe pain.   metoprolol succinate 50 MG 24 hr tablet Commonly known as: TOPROL-XL TAKE 1 TABLET BY MOUTH DAILY WITH OR IMMEDIATELY FOLLOWING A MEAL What changed: See the new instructions.   morphine 15 MG 12 hr tablet Commonly known as: MS CONTIN Take 1 tablet (15 mg total) by mouth every 12 (twelve) hours for 3 days.    pantoprazole 40 MG tablet Commonly known as: PROTONIX Take 1 tablet (40 mg total) by mouth daily. Start taking on: August 10, 2019   polyethylene glycol 17 g packet Commonly known as: MIRALAX / GLYCOLAX Take 17 g by mouth daily. Start taking on: August 10, 2019   pregabalin 25 MG capsule Commonly known as: LYRICA Take 1 capsule (25 mg total) by mouth 2 (two) times daily.   senna 8.6 MG Tabs tablet Commonly known as: SENOKOT Take 1 tablet (8.6 mg total) by mouth 2 (two) times daily.   simvastatin 10 MG tablet Commonly known as: ZOCOR TAKE 1 TABLET BY MOUTH ONCE EVERY EVENING What changed: See the new instructions.   tamsulosin 0.4 MG Caps capsule Commonly known as: FLOMAX Take 0.8 mg by mouth daily.   traZODone 50 MG tablet Commonly known as: DESYREL Take 1 tablet (50 mg total) by mouth at bedtime as needed for sleep.   Xarelto 20 MG Tabs tablet Generic drug: rivaroxaban TAKE 1 TABLET BY MOUTH ONCE DAILY WITH SUPPER What changed: See the new instructions.            Durable Medical Equipment  (From admission, onward)         Start     Ordered   08/06/19 1723  For home use only DME lightweight manual wheelchair with seat cushion  Once       Comments: Patient suffers from hip fracture which impairs their ability to perform daily activities in the home.  A walking aid will not resolve  issue with performing activities of daily living. A wheelchair will allow patient to safely perform daily activities. Patient is not able to propel themselves in the home using a standard weight wheelchair due to weakness. Patient can self propel in the lightweight wheelchair. Length of need 6 months.  Accessories: elevating leg rests (ELRs), wheel locks, extensions and anti-tippers.   08/06/19 1723   08/06/19 1722  For home use only DME Hospital bed  Once       Question Answer Comment  Length of Need Lifetime   The above medical condition requires: Patient requires the ability to  reposition frequently   Head must be elevated greater than: 30 degrees   Bed type Semi-electric      08/06/19 1721   08/06/19 1722  For home use only DME Bedside commode  Once       Question:  Patient needs a bedside commode to treat with the following condition  Answer:  Metastatic cancer ([place])   08/06/19 1721           Discharge Care Instructions  (From admission, onward)         Start     Ordered   08/09/19 0000  Discharge wound care:       Comments: Dressing changes as per orthopedics recommendations.   08/09/19 1506          Follow-up Information  Swinteck, Aaron Edelman, MD. Schedule an appointment as soon as possible for a visit in 2 week(s).   Specialty: Orthopedic Surgery Why: For wound re-check Contact information: [address] [place] [address] [phone]        [facility] Follow up.   Specialty: [place] Why: This agency will contact you to arrange visits with RN, therapists and aide.  Contact information: [address] [place] [address] [phone]        [facility]. Call.   Why: This agency may be able to help with transportation to radiation appointments.  Wheelchair vans available.              No Known Allergies  Consultations:  Oncology  Radiation oncology  Orthopedics.   Palliative care     Procedures/Studies: CT CHEST W CONTRAST  Result Date: 07/29/2019 CLINICAL DATA:  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 CONTRAST:  164mL OMNIPAQUE IOHEXOL 300 MG/ML  SOLN COMPARISON:  CT left hip, 07/29/2019, MR lumbar spine, 07/28/2019 FINDINGS: CT CHEST FINDINGS Cardiovascular: Aortic  atherosclerosis. Normal heart size. Three-vessel coronary artery calcifications and stents. No pericardial effusion. Mediastinum/Nodes: No enlarged mediastinal, hilar, or axillary lymph nodes. There is thickening of the esophagus near the gastroesophageal junction, likely a small hiatal hernia (series 5, image 73). Thyroid gland and trachea demonstrate no significant findings. Lungs/Pleura: Mild centrilobular emphysema. Trace bilateral pleural effusions and bandlike scarring or atelectasis of the bilateral lung bases. Evidence of prior left lower lobe wedge resection. Nonspecific 6 mm pulmonary nodule of the right lung base, new compared to remote prior CT dated 05/30/2006 (series 4, image 108). Musculoskeletal: Lytic lesion involving the spine of the right scapula measuring 2.6 x 1.9 cm (series 3, image 1). Subtle lytic lesions involving the left manubrium measuring 1.4 x 1.3 cm (series 3, image 11) and the inner table of the inferior manubrium measuring 1.9 x 1.4 cm (series 3, image 16). There is a lytic osseous lesion involving the left aspect of the T9 and T10 vertebral bodies with and extraosseous soft tissue component, measuring 3.9 x 3.6 cm at the level of T10 (series 3, image 43). CT ABDOMEN PELVIS FINDINGS Hepatobiliary: There are multiple low-attenuation lesions of the liver, the majority of which appear to be simple cysts and stable compared to remote prior examination dated 05/30/2006, however there are multiple additional, ill-defined hypodense lesions, for example of the liver dome measuring 1.7 x 1.1 cm (series 3, image 46) and of the anterior right lobe of the liver measuring 1.6 x 1.5 cm (series 3, image 63). No gallstones, gallbladder wall thickening, or biliary dilatation. Pancreas: Unremarkable. No pancreatic ductal dilatation or surrounding inflammatory changes. Spleen: Normal in size without significant abnormality. Adrenals/Urinary Tract: Right adrenal nodule measuring 2.2 x 2.1 cm, enlarged  compared to prior examination dated 05/30/2006 although low-attenuation, HU = 9, and very likely a benign adenoma (series 3, image 55). There are bilateral simple cysts of the kidneys. 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 Bladder is unremarkable. Stomach/Bowel: Stomach is within normal limits. Appendix appears normal. No evidence of bowel wall thickening, distention, or inflammatory changes. Sigmoid diverticulosis. Moderate burden of stool balls throughout the colon. Vascular/Lymphatic: Severe aortic atherosclerosis. No enlarged abdominal or pelvic lymph  nodes. Reproductive: No mass or other abnormality. Other: No abdominal wall hernia or abnormality. Evidence of prior right inguinal hernia repair. No abdominopelvic ascites. Musculoskeletal: Lytic lesion of the right aspect of the ilium abutting the sacroiliac joint measuring 3.0 x 1.9 cm (series 3, image 101). Previously described lytic lesions involving the left hemi sacrum and adjacent ilium, measuring 5.6 x 4.7 cm (series 3, image 100) and with a pathologic fracture of the left femoral neck, soft tissue mass measuring 5.5 x 3.8 cm (series 3, image 12). Lytic lesion of the right femoral neck with erosion of the posterior cortex measuring 4.0 x 3.5 cm (series 3, image 125). IMPRESSION: 1. Multifocal osseous metastatic disease as detailed above, including a previously identified pathologic fracture of the left femoral neck. There are multiple additional lesions of mechanical significance at risk for pathologic fracture, particularly of the right femoral neck and T9 and T10 vertebral bodies. 2. Ill-defined low-attenuation lesions of the liver consistent with hepatic metastatic disease. 3. There is no candidate primary lesion confidently identified in the chest, abdomen, or pelvis. 4. Thickening of the esophagus near the gastroesophageal junction, likely a small hiatal hernia, esophageal malignancy less favored and without secondary findings such as  adjacent lymphadenopathy. 5. Nonspecific 6 mm pulmonary nodule of the right lung base, new compared to remote prior CT dated 05/30/2006 although generally an unlikely solitary manifestation of pulmonary metastatic disease. Attention on follow-up. 6. Right adrenal nodule, which is enlarged compared to prior examination dated 05/30/2006 although low-attenuation and very likely a benign adenoma. Attention on follow-up. 7. There is a pathologic fracture of the left femoral neck with soft tissue mass measuring 5.5 x 3.8 cm. 8. Trace bilateral pleural effusions without obvious nodularity or other manifestations of pleural metastatic disease. 9. Evidence of prior left lower lobe wedge resection. 10. Other chronic, incidental, and postoperative findings as detailed above. Coronary artery disease. Emphysema (ICD10-J43.9). Aortic Atherosclerosis (ICD10-I70.0). Electronically Signed   By: Eddie Candle M.D.   On: 07/29/2019 08:29   MR LUMBAR SPINE WO CONTRAST  Result Date: 07/28/2019 CLINICAL DATA:  Back pain with left lower extremity weakness EXAM: MRI LUMBAR SPINE WITHOUT CONTRAST TECHNIQUE: Multiplanar, multisequence MR imaging of the lumbar spine was performed. No intravenous contrast was administered. COMPARISON:  04/24/2017 FINDINGS: Some sequences had to be omitted due to SAR restrictions related to the patient's spinal stimulator. Segmentation: Standard. Alignment:  Physiologic. Vertebrae:  L3-5 PLIF.  No acute abnormality. Conus medullaris and cauda equina: Conus extends to the L1 level. Conus and cauda equina appear normal. Paraspinal and other soft tissues: Negative Disc levels: L1-L2: Normal disc space and facet joints. There is no spinal canal stenosis. No neural foraminal stenosis. L2-L3: Mild disc bulge. Endplate spurring. There is no spinal canal stenosis. Mild bilateral neural foraminal stenosis. L3-L4: PLIF. There is no spinal canal stenosis. Limited visualization of the neural foramina due to  susceptibility effects from spinal hardware. Within that limitation, no visible neural foraminal stenosis. L4-L5: PLIF. There is no spinal canal stenosis. Limited visualization of the neural foramina due to susceptibility effects from spinal hardware. Within that limitation, no visible neural foraminal stenosis. L5-S1: Normal disc space and facet joints. There is no spinal canal stenosis. No neural foraminal stenosis. Visualized sacrum: Normal. IMPRESSION: 1. Scan time had to be limited due to SAR restrictions imposed by the patient's spinal stimulator device. This caused the examination to be truncated and of reduced image quality. 2. L3-5 PLIF without spinal canal stenosis. No visible foraminal stenosis within  limitations caused by adjacent hardware. 3. Mild bilateral L2-3 neural foraminal stenosis. Electronically Signed   By: Ulyses Jarred M.D.   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 CONTRAST:  148mL OMNIPAQUE IOHEXOL 300 MG/ML  SOLN COMPARISON:  CT left hip, 07/29/2019, MR lumbar spine, 07/28/2019 FINDINGS: CT CHEST FINDINGS Cardiovascular: Aortic atherosclerosis. Normal heart size. Three-vessel coronary artery calcifications and stents. No pericardial effusion. Mediastinum/Nodes: No enlarged mediastinal, hilar, or axillary lymph nodes. There is thickening of the esophagus near the gastroesophageal junction, likely a small hiatal hernia (series 5, image 73). Thyroid gland and trachea demonstrate no significant findings. Lungs/Pleura: Mild centrilobular emphysema. Trace bilateral pleural effusions and bandlike scarring or atelectasis of the  bilateral lung bases. Evidence of prior left lower lobe wedge resection. Nonspecific 6 mm pulmonary nodule of the right lung base, new compared to remote prior CT dated 05/30/2006 (series 4, image 108). Musculoskeletal: Lytic lesion involving the spine of the right scapula measuring 2.6 x 1.9 cm (series 3, image 1). Subtle lytic lesions involving the left manubrium measuring 1.4 x 1.3 cm (series 3, image 11) and the inner table of the inferior manubrium measuring 1.9 x 1.4 cm (series 3, image 16). There is a lytic osseous lesion involving the left aspect of the T9 and T10 vertebral bodies with and extraosseous soft tissue component, measuring 3.9 x 3.6 cm at the level of T10 (series 3, image 43). CT ABDOMEN PELVIS FINDINGS Hepatobiliary: There are multiple low-attenuation lesions of the liver, the majority of which appear to be simple cysts and stable compared to remote prior examination dated 05/30/2006, however there are multiple additional, ill-defined hypodense lesions, for example of the liver dome measuring 1.7 x 1.1 cm (series 3, image 46) and of the anterior right lobe of the liver measuring 1.6 x 1.5 cm (series 3, image 63). No gallstones, gallbladder wall thickening, or biliary dilatation. Pancreas: Unremarkable. No pancreatic ductal dilatation or surrounding inflammatory changes. Spleen: Normal in size without significant abnormality. Adrenals/Urinary Tract: Right adrenal nodule measuring 2.2 x 2.1 cm, enlarged compared to prior examination dated 05/30/2006 although low-attenuation, HU = 9, and very likely a benign adenoma (series 3, image 55). There are bilateral simple cysts of the kidneys. 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 Bladder is unremarkable. Stomach/Bowel: Stomach is within normal limits. Appendix appears normal. No evidence of bowel wall thickening, distention, or inflammatory changes. Sigmoid diverticulosis. Moderate burden of stool balls throughout the colon.  Vascular/Lymphatic: Severe aortic atherosclerosis. No enlarged abdominal or pelvic lymph nodes. Reproductive: No mass or other abnormality. Other: No abdominal wall hernia or abnormality. Evidence of prior right inguinal hernia repair. No abdominopelvic ascites. Musculoskeletal: Lytic lesion of the right aspect of the ilium abutting the sacroiliac joint measuring 3.0 x 1.9 cm (series 3, image 101). Previously described lytic lesions involving the left hemi sacrum and adjacent ilium, measuring 5.6 x 4.7 cm (series 3, image 100) and with a pathologic fracture of the left femoral neck, soft tissue mass measuring 5.5 x 3.8 cm (series 3, image 12). Lytic lesion of the right femoral neck with erosion of the posterior cortex measuring 4.0 x 3.5 cm (series 3, image 125). IMPRESSION: 1. Multifocal osseous  metastatic disease as detailed above, including a previously identified pathologic fracture of the left femoral neck. There are multiple additional lesions of mechanical significance at risk for pathologic fracture, particularly of the right femoral neck and T9 and T10 vertebral bodies. 2. Ill-defined low-attenuation lesions of the liver consistent with hepatic metastatic disease. 3. There is no candidate primary lesion confidently identified in the chest, abdomen, or pelvis. 4. Thickening of the esophagus near the gastroesophageal junction, likely a small hiatal hernia, esophageal malignancy less favored and without secondary findings such as adjacent lymphadenopathy. 5. Nonspecific 6 mm pulmonary nodule of the right lung base, new compared to remote prior CT dated 05/30/2006 although generally an unlikely solitary manifestation of pulmonary metastatic disease. Attention on follow-up. 6. Right adrenal nodule, which is enlarged compared to prior examination dated 05/30/2006 although low-attenuation and very likely a benign adenoma. Attention on follow-up. 7. There is a pathologic fracture of the left femoral neck with soft  tissue mass measuring 5.5 x 3.8 cm. 8. Trace bilateral pleural effusions without obvious nodularity or other manifestations of pleural metastatic disease. 9. Evidence of prior left lower lobe wedge resection. 10. Other chronic, incidental, and postoperative findings as detailed above. Coronary artery disease. Emphysema (ICD10-J43.9). Aortic Atherosclerosis (ICD10-I70.0). Electronically Signed   By: Eddie Candle M.D.   On: 07/29/2019 08:29   CT HIP LEFT WO CONTRAST  Result Date: 07/29/2019 CLINICAL DATA:  Severe hip pain. EXAM: CT OF THE LEFT HIP WITHOUT CONTRAST TECHNIQUE: Multidetector CT imaging of the left hip was performed according to the standard protocol. Multiplanar CT image reconstructions were also generated. COMPARISON:  MRI dated June 2021 FINDINGS: Bones/Joint/Cartilage There is a lytic mass eroding the left sacrum and left iliac bone measuring approximately 6.5 cm. There is a lytic lesion involving the left femoral neck with an associated pathologic fracture. Ligaments Suboptimally assessed by CT. Muscles and Tendons The muscles are unremarkable where visualized. Soft tissues There is a small fat containing left inguinal hernia. There is scattered colonic diverticula without CT evidence for diverticulitis. The bladder is distended. IMPRESSION: 1. Lytic lesion involving the left femoral neck with associated pathologic fracture. 2. There is an additional lytic lesion in the left iliac bone and left sacrum. 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 3. Diverticulosis without CT evidence for diverticulitis. 4. Distended urinary bladder. 5. Fat containing left inguinal hernia. These results will be called to the ordering clinician or representative by the Radiologist Assistant, and communication documented in the PACS or [employer]. Electronically Signed   By: Constance Holster M.D.   On: 07/29/2019 03:27   CT FEMUR LEFT WO CONTRAST  Result Date: 07/30/2019 CLINICAL  DATA:  Pathologic fracture of the left femur EXAM: CT OF THE LOWER LEFT EXTREMITY WITHOUT CONTRAST TECHNIQUE: Multidetector CT imaging of the lower left extremity was performed according to the standard protocol. COMPARISON:  CT and x-ray 07/29/2019 FINDINGS: Bones/Joint/Cartilage Expansile lytic mass centered at the left femoral neck with comminuted pathologic fracture involving the basicervical and intertrochanteric aspects of the femur. Fracture is mildly impacted. No significant displacement or angulation. Left femoral neck mass with prominent soft tissue component emanating from the posterior cortex in total measures approximately 5.6 x 4.2 x 4.5 cm (series 2, image 30; series 5, image 50). Known lytic lesion of the left ilium is partially visualized at the edge of the field of view (series 1, image 3), more fully characterized on CT abdomen-pelvis 07/29/2019. No additional lytic or sclerotic bony lesions are seen  within the field of view. Mild joint space narrowing of the left hip. No dislocation. No evidence of femoral head avascular necrosis. Ligaments Suboptimally assessed by CT. Muscles and Tendons Soft tissue mass results in mass effect on the adjacent left quadratus femoris muscle without obvious muscular invasion, although evaluation is limited with noncontrast CT. Musculotendinous structures appear intact within the limitations of CT. Soft tissues No additional soft tissue masses. No fluid collection. Sigmoid diverticulosis is noted. IMPRESSION: 1. Expansile lytic mass centered at the left femoral neck with comminuted pathologic fracture involving the basicervical and intertrochanteric aspects of the femur. Fracture is mildly impacted. No significant displacement or angulation. 2. Known lytic lesion of the left ilium is partially visualized at the edge of the field of view, more fully characterized on CT abdomen-pelvis 07/29/2019. Electronically Signed   By: Davina Poke D.O.   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 COMPARISON:  CT 07/29/2019 FINDINGS: Bones/Joint/Cartilage Lytic mildly expansile mass centered within the intertrochanteric aspect of the proximal right femur measuring 4.6 x 3.4 x 5.7 cm (series 3, image 33; series 9, image 72). There is significant cortical breakthrough of the posterior and superior cortex of the femoral neck (series 3, image 32; series 7, image 66). Mass occupies the full diameter of the inferior femoral neck. Small extraosseous soft tissue component along the posterior femoral neck is suggested with mild mass effect on the traversing quadratus femoris muscle. No complete pathologic fracture is evident at this time. Known lytic mass in the posterior right ilium was not included within the field of view. Ill-defined area of lucency within the central aspect of the distal femoral metaphysis measuring 1.7 x 1.1 x 1.1 cm (series 3, image 155; series 7, image 62) is nonspecific and may reflect a area of prominent marrow fat. A small lytic lesion not excluded. No additional areas of lytic or sclerotic bone lesion is identified. Ligaments Suboptimally assessed by CT. Muscles and Tendons No acute myotendinous abnormality. Soft tissues No soft tissue fluid collection. IMPRESSION: 1. Lytic mildly expansile mass centered within the intertrochanteric aspect of the proximal right femur measuring up to 5.7 cm with significant cortical breakthrough of the posterior and superior cortex of the femoral neck. Small extraosseous soft tissue component along the posterior femoral neck is suggested with mild mass effect on the traversing quadratus femoris muscle. This lesion is at high risk of pathologic fracture.  2. Ill-defined 1.7 cm area of lucency within the central aspect of the distal femoral metaphysis is nonspecific and may reflect a area of prominent marrow fat. A small lytic lesion not excluded. MRI could be performed for confirmation, as clinically indicated. Electronically Signed   By: Davina Poke D.O.   On: 07/30/2019 08:36   DG C-Arm 1-60 Min  Result Date: 08/02/2019 CLINICAL DATA:  Right intramedullary femoral IM nail for impending femoral fracture. EXAM: RIGHT FEMUR 2 VIEWS; DG C-ARM 1-60 MIN COMPARISON:  Preoperative imaging. FINDINGS: Six fluoroscopic spot views obtained in the operating room in frontal and lateral projections. Placement of intramedullary nail with trans trochanteric and distal locking screws. Known osseous metastatic disease is not well visualized on fluoroscopic spot views. Fluoroscopy time 1 minutes 43 seconds. Dose 7.30 mGy. IMPRESSION: Fluoroscopic spot views during  right femoral intramedullary nail placement. Electronically Signed   By: Keith Rake M.D.   On: 08/02/2019 15:30   DG HIP OPERATIVE UNILAT W OR W/O PELVIS LEFT  Result Date: 07/30/2019 CLINICAL DATA:  Left hemiarthroplasty. EXAM: OPERATIVE LEFT HIP (WITH PELVIS IF PERFORMED) TECHNIQUE: Fluoroscopic spot image(s) were submitted for interpretation post-operatively. COMPARISON:  Radiograph yesterday. FINDINGS: Three spot images obtained in the operating room during left hip arthroplasty. Fluoroscopy time not provided. IMPRESSION: Three spot images obtained in the operating room during left hip arthroplasty. Electronically Signed   By: Keith Rake M.D.   On: 07/30/2019 18:25   DG HIP UNILAT WITH PELVIS 2-3 VIEWS LEFT  Result Date: 07/29/2019 CLINICAL DATA:  Recent falls EXAM: DG HIP (WITH OR WITHOUT PELVIS) 2-3V LEFT COMPARISON:  None. FINDINGS: Frontal pelvis as well as frontal and lateral left hip images were obtained. There is a comminuted fracture of the basicervical and intertrochanteric regions  the proximal left femur. There is slight displacement of fracture fragments at the basicervical femoral neck region. There is inferior avulsion of the lesser trochanter. No other fractures are evident. No dislocation. There is mild symmetric narrowing of each hip joint. Postoperative change noted at L4, L5, S1. A stimulator device is present over the right iliac crest. There are surgical clips in the right inguinal region. IMPRESSION: Comminuted fracture of the proximal left femur with basicervical intertrochanteric components. Avulsion lesser trochanter. Mild displacement of fracture fragments along the lateral portion of the basicervical portion of the fracture. No other fractures. No dislocation. Mild symmetric narrowing each hip joint. Electronically Signed   By: Lowella Grip III M.D.   On: 07/29/2019 13:17   DG FEMUR MIN 2 VIEWS LEFT  Result Date: 07/29/2019 CLINICAL DATA:  Proximal femur fracture following fall EXAM: LEFT FEMUR 2 VIEWS COMPARISON:  Pelvis and left hip radiographs July 29, 2019 FINDINGS: Frontal and lateral views were obtained. There is a comminuted basicervical and intertrochanteric portions of the proximal left femur. No inferior displacement of the lesser trochanter. No more distal fractures are evident. No dislocation. There is mild narrowing of the left hip joint. The knee joint region appears normal. No joint effusion. Foci of arterial vascular calcification noted. IMPRESSION: Comminuted fracture proximal left femur. Note that there is avulsion with inferior displacement of the lesser trochanter on the left. No new more distal fractures are evident. No dislocation. Mild narrowing left hip joint. Foci of arterial vascular calcification noted. Electronically Signed   By: Lowella Grip III M.D.   On: 07/29/2019 13:15   DG FEMUR, MIN 2 VIEWS RIGHT  Result Date: 08/02/2019 CLINICAL DATA:  Right intramedullary femoral IM nail for impending femoral fracture. EXAM: RIGHT FEMUR 2  VIEWS; DG C-ARM 1-60 MIN COMPARISON:  Preoperative imaging. FINDINGS: Six fluoroscopic spot views obtained in the operating room in frontal and lateral projections. Placement of intramedullary nail with trans trochanteric and distal locking screws. Known osseous metastatic disease is not well visualized on fluoroscopic spot views. Fluoroscopy time 1 minutes 43 seconds. Dose 7.30 mGy. IMPRESSION: Fluoroscopic spot views during right femoral intramedullary nail placement. Electronically Signed   By: Keith Rake M.D.   On: 08/02/2019 15:30     Subjective: No new complaints.   Discharge Exam: Vitals:   08/09/19 0739 08/09/19 1410  BP: (!) 141/82 139/80  Pulse: 64 71  Resp: 16 16  Temp: 97.8 F (36.6 C) 97.9 F (36.6 C)  SpO2: 95% 99%   Vitals:   08/08/19 1915 08/09/19 0300 08/09/19  0739 08/09/19 1410  BP: 110/64 131/61 (!) 141/82 139/80  Pulse: 66 64 64 71  Resp: 17 16 16 16   Temp: 98.1 F (36.7 C) 98.7 F (37.1 C) 97.8 F (36.6 C) 97.9 F (36.6 C)  TempSrc: Axillary Axillary Oral Oral  SpO2: 99% 93% 95% 99%  Weight:      Height:        General: Pt is alert, awake, not in acute distress Cardiovascular: RRR, S1/S2 +, no rubs, no gallops Respiratory: CTA bilaterally, no wheezing, no rhonchi Abdominal: Soft, NT, ND, bowel sounds + Extremities: no edema, no cyanosis    The results of significant diagnostics from this hospitalization (including imaging, microbiology, ancillary and laboratory) are listed below for reference.     Microbiology: No results found for this or any previous visit (from the past 240 hour(s)).   Labs: BNP (last 3 results) No results for input(s): BNP in the last 8760 hours. Basic Metabolic Panel: Recent Labs  Lab 08/03/19 0209 08/04/19 0810 08/06/19 0328  NA 139 138 141  K 4.1 3.2* 4.9  CL 104 102 106  CO2 24 26 26   GLUCOSE 145* 98 135*  BUN 21 24* 23  CREATININE 1.01 1.07 1.05  1.05  CALCIUM 9.9 9.6 9.8   Liver Function  Tests: No results for input(s): AST, ALT, ALKPHOS, BILITOT, PROT, ALBUMIN in the last 168 hours. No results for input(s): LIPASE, AMYLASE in the last 168 hours. No results for input(s): AMMONIA in the last 168 hours. CBC: Recent Labs  Lab 08/05/19 0335 08/06/19 0328 08/07/19 0345 08/08/19 0731 08/09/19 0326  WBC 17.5* 18.1* 23.5* 25.7* 23.3*  HGB 7.5* 7.7* 7.4* 8.0* 7.6*  HCT 23.2* 24.1* 22.9* 24.6* 23.8*  MCV 93.2 94.5 94.2 96.1 96.4  PLT 387 423* 469* 534* 515*   Cardiac Enzymes: No results for input(s): CKTOTAL, CKMB, CKMBINDEX, TROPONINI in the last 168 hours. BNP: Invalid input(s): POCBNP CBG: No results for input(s): GLUCAP in the last 168 hours. D-Dimer No results for input(s): DDIMER in the last 72 hours. Hgb A1c No results for input(s): HGBA1C in the last 72 hours. Lipid Profile No results for input(s): CHOL, HDL, LDLCALC, TRIG, CHOLHDL, LDLDIRECT in the last 72 hours. Thyroid function studies No results for input(s): TSH, T4TOTAL, T3FREE, THYROIDAB in the last 72 hours.  Invalid input(s): FREET3 Anemia work up No results for input(s): VITAMINB12, FOLATE, FERRITIN, TIBC, IRON, RETICCTPCT in the last 72 hours. Urinalysis    Component Value Date/Time   COLORURINE STRAW (A) 07/29/2019 0057   APPEARANCEUR CLEAR 07/29/2019 0057   APPEARANCEUR Clear 10/27/2017 1241   LABSPEC 1.005 07/29/2019 0057   LABSPEC 1.004 11/08/2011 1457   PHURINE 7.0 07/29/2019 0057   GLUCOSEU NEGATIVE 07/29/2019 0057   GLUCOSEU NEGATIVE 10/12/2013 1022   HGBUR SMALL (A) 07/29/2019 0057   BILIRUBINUR NEGATIVE 07/29/2019 0057   BILIRUBINUR Negative 10/27/2017 1241   BILIRUBINUR Negative 11/08/2011 1457   KETONESUR 5 (A) 07/29/2019 0057   PROTEINUR NEGATIVE 07/29/2019 0057   UROBILINOGEN 0.2 10/12/2013 1032   UROBILINOGEN 0.2 10/12/2013 1022   NITRITE NEGATIVE 07/29/2019 0057   LEUKOCYTESUR NEGATIVE 07/29/2019 0057   LEUKOCYTESUR Negative 11/08/2011 1457   Sepsis Labs Invalid  input(s): PROCALCITONIN,  WBC,  LACTICIDVEN Microbiology No results found for this or any previous visit (from the past 240 hour(s)).   Time coordinating discharge: 34 minutes.   SIGNED:   Hosie Poisson, MD  [facility] 08/09/2019, 3:07 PM

## 2019-08-09 NOTE — Progress Notes (Signed)
Spoke with the patients nurse [place] letting her know the patient is scheduled for a radiation treatment today at 4:00 pm and that she would need to call [facility] to have the patient here at 3:30 pm.  [facility] number provided.  Will continue to follow as necessary.  Gloriajean Dell. Leonie Green, BSN

## 2019-08-09 NOTE — Progress Notes (Signed)
OT Cancellation Note  Patient Details Name: Greg Hughes MRN: [id number] DOB: 08-16-1940   Cancelled Treatment:    Reason Eval/Treat Not Completed: Patient at procedure or test/ unavailable. Per RN, patient to be transported off floor for radiation.   Gloris [place] OTR/L Supplemental OT, Department of rehab services [phone]  Naquan Garman R H. 08/09/2019, 3:01 PM

## 2019-08-09 NOTE — Progress Notes (Signed)
Per Dr. Benay Spice, I requested recent tissue bx to be tested with Foundation One and PDL 1.

## 2019-08-09 NOTE — TOC Transition Note (Signed)
Transition of Care [facility]) - CM/SW Discharge Note   Patient Details  Name: Greg Hughes MRN: [id number] Date of Birth: 1940-07-25  Transition of Care [facility]) CM/SW Contact:  Sharin Mons, RN Phone Number: [phone] 08/09/2019, 4:28 PM   Clinical Narrative:    Pt will transition to home with home health services [facility]) and  palliative care ([facility]) after radiation therapy today. NCM provided wife with [employer] information to assist with transporting pt to and from radiation therapy once d/c. Wife states has supportive family and friends and will assist with needs. DME delivered to home ... NCM confirmed with wife.  Wife without  Rx meds concerns or affordability.   PTAR [phone])  WILL NEED TO BE ARRANGED FOR TRANSPORTATION TO HOME ONCE PT IS BACK FROM RADIATION THERAPY. Transportation papers on front of chart. Bedside nurse made aware.  Final next level of care: [place] Barriers to Discharge: No Barriers Identified   Patient Goals and CMS Choice Patient states their goals for this hospitalization and ongoing recovery are:: for him to come home if possible CMS Medicare.gov Compare Post Acute Care list provided to:: Patient Represenative (must comment) (pt spouse) Choice offered to / list presented to : Spouse  Discharge Placement                       Discharge Plan and Services In-house Referral: Clinical Social Work Discharge Planning Services: CM Consult Post Acute Care Choice: Home Health, Durable Medical Equipment (vs. SNF)          DME Arranged: 3-N-1, Hospital bed, Lightweight manual wheelchair with seat cushion DME Agency: [employer] Date DME Agency Contacted: 08/07/19 Time DME Agency Contacted: 58 Representative spoke with at DME Agency: [place]: PT, OT, RN, Nurse's Aide [place] Agency: [place] Date [place]: 08/07/19 Time [place]:  [place] Representative spoke with at [place]: Rebecca ([place]) Interventions     Readmission Risk Interventions Readmission Risk Prevention Plan 07/31/2019  Transportation Screening Complete  PCP or Specialist Appt within 5-7 Days Not Complete  Not Complete comments disposition pending  Home Care Screening Complete  Medication Review (RN CM) Referral to Pharmacy  Some recent data might be hidden

## 2019-08-09 NOTE — Telephone Encounter (Signed)
Telephone call from wife returning RN's call.  Wife in agreement with palliative care home visit 08/11/19 at 2:00 PM.

## 2019-08-09 NOTE — Progress Notes (Signed)
I called [facility] this morning to set up a transportation for radiation, to be picked up at 1530.

## 2019-08-09 NOTE — Progress Notes (Addendum)
HEMATOLOGY-ONCOLOGY PROGRESS NOTE  SUBJECTIVE: Pain was overall better controlled earlier today.  However after sitting up for 2 hours in the recliner, his pain is now worse.  Scheduled to begin radiation later today and then discharge to home.  PHYSICAL EXAMINATION:  Vitals:   08/09/19 0300 08/09/19 0739  BP: 131/61 (!) 141/82  Pulse: 64 64  Resp: 16 16  Temp: 98.7 F (37.1 C) 97.8 F (36.6 C)  SpO2: 93% 95%   Filed Weights   07/28/19 1735 08/04/19 0910  Weight: 79.4 kg 79.3 kg    Intake/Output from previous day: 06/20 0701 - 06/21 0700 In: 0  Out: 2150 [Urine:2150]  GENERAL: Chronically ill-appearing, no distress LUNGS: Diminished  HEART: regular rate & rhythm and no murmurs and no lower extremity edema ABDOMEN:abdomen soft, non-tender and normal bowel sounds  NEURO: alert & oriented x 3 with fluent speech, no focal motor/sensory deficits  LABORATORY DATA:  I have reviewed the data as listed CMP Latest Ref Rng & Units 08/06/2019 08/06/2019 08/04/2019  Glucose 70 - 99 mg/dL 135(H) - 98  BUN 8 - 23 mg/dL 23 - 24(H)  Creatinine 0.61 - 1.24 mg/dL 1.05 1.05 1.07  Sodium 135 - 145 mmol/L 141 - 138  Potassium 3.5 - 5.1 mmol/L 4.9 - 3.2(L)  Chloride 98 - 111 mmol/L 106 - 102  CO2 22 - 32 mmol/L 26 - 26  Calcium 8.9 - 10.3 mg/dL 9.8 - 9.6  Total Protein 6.5 - 8.1 g/dL - - -  Total Bilirubin 0.3 - 1.2 mg/dL - - -  Alkaline Phos 38 - 126 U/L - - -  AST 15 - 41 U/L - - -  ALT 0 - 44 U/L - - -    Lab Results  Component Value Date   WBC 23.3 (H) 08/09/2019   HGB 7.6 (L) 08/09/2019   HCT 23.8 (L) 08/09/2019   MCV 96.4 08/09/2019   PLT 515 (H) 08/09/2019   NEUTROABS 10.3 (H) 07/29/2019    CT CHEST W CONTRAST  Result Date: 07/29/2019 CLINICAL DATA:  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 CONTRAST:  161m OMNIPAQUE IOHEXOL 300 MG/ML  SOLN COMPARISON:  CT left hip, 07/29/2019, MR lumbar spine, 07/28/2019 FINDINGS: CT CHEST FINDINGS Cardiovascular: Aortic atherosclerosis. Normal heart size. Three-vessel coronary artery calcifications and stents. No pericardial effusion. Mediastinum/Nodes: No enlarged mediastinal, hilar, or axillary lymph nodes. There is thickening of the esophagus near the gastroesophageal junction, likely a small hiatal hernia (series 5, image 73). Thyroid gland and trachea demonstrate no significant findings. Lungs/Pleura: Mild centrilobular emphysema. Trace bilateral pleural effusions and bandlike scarring or atelectasis of the bilateral lung bases. Evidence of prior left lower lobe wedge resection. Nonspecific 6 mm pulmonary nodule of the right lung base, new compared to remote prior CT dated 05/30/2006 (series 4, image 108). Musculoskeletal: Lytic lesion involving the spine of the right scapula measuring 2.6 x 1.9 cm (series 3, image 1). Subtle lytic lesions involving the left manubrium measuring 1.4 x 1.3 cm (series 3, image 11) and the inner table of the inferior manubrium measuring 1.9 x 1.4 cm (series 3, image 16). There is a lytic osseous lesion involving the left aspect of the T9 and T10 vertebral bodies with and extraosseous soft tissue component, measuring 3.9 x 3.6 cm  at the level of T10 (series 3, image 43). CT ABDOMEN PELVIS FINDINGS Hepatobiliary: There are multiple low-attenuation lesions of the liver, the majority of which appear to be simple cysts and stable compared to remote prior examination dated 05/30/2006, however there are multiple additional, ill-defined hypodense lesions, for example of the liver dome measuring 1.7 x 1.1 cm (series 3, image 46) and of the anterior right lobe of the liver measuring 1.6 x 1.5 cm (series 3, image 63). No gallstones, gallbladder wall  thickening, or biliary dilatation. Pancreas: Unremarkable. No pancreatic ductal dilatation or surrounding inflammatory changes. Spleen: Normal in size without significant abnormality. Adrenals/Urinary Tract: Right adrenal nodule measuring 2.2 x 2.1 cm, enlarged compared to prior examination dated 05/30/2006 although low-attenuation, HU = 9, and very likely a benign adenoma (series 3, image 55). There are bilateral simple cysts of the kidneys. 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 Bladder is unremarkable. Stomach/Bowel: Stomach is within normal limits. Appendix appears normal. No evidence of bowel wall thickening, distention, or inflammatory changes. Sigmoid diverticulosis. Moderate burden of stool balls throughout the colon. Vascular/Lymphatic: Severe aortic atherosclerosis. No enlarged abdominal or pelvic lymph nodes. Reproductive: No mass or other abnormality. Other: No abdominal wall hernia or abnormality. Evidence of prior right inguinal hernia repair. No abdominopelvic ascites. Musculoskeletal: Lytic lesion of the right aspect of the ilium abutting the sacroiliac joint measuring 3.0 x 1.9 cm (series 3, image 101). Previously described lytic lesions involving the left hemi sacrum and adjacent ilium, measuring 5.6 x 4.7 cm (series 3, image 100) and with a pathologic fracture of the left femoral neck, soft tissue mass measuring 5.5 x 3.8 cm (series 3, image 12). Lytic lesion of the right femoral neck with erosion of the posterior cortex measuring 4.0 x 3.5 cm (series 3, image 125). IMPRESSION: 1. Multifocal osseous metastatic disease as detailed above, including a previously identified pathologic fracture of the left femoral neck. There are multiple additional lesions of mechanical significance at risk for pathologic fracture, particularly of the right femoral neck and T9 and T10 vertebral bodies. 2. Ill-defined low-attenuation lesions of the liver consistent with hepatic metastatic disease. 3. There  is no candidate primary lesion confidently identified in the chest, abdomen, or pelvis. 4. Thickening of the esophagus near the gastroesophageal junction, likely a small hiatal hernia, esophageal malignancy less favored and without secondary findings such as adjacent lymphadenopathy. 5. Nonspecific 6 mm pulmonary nodule of the right lung base, new compared to remote prior CT dated 05/30/2006 although generally an unlikely solitary manifestation of pulmonary metastatic disease. Attention on follow-up. 6. Right adrenal nodule, which is enlarged compared to prior examination dated 05/30/2006 although low-attenuation and very likely a benign adenoma. Attention on follow-up. 7. There is a pathologic fracture of the left femoral neck with soft tissue mass measuring 5.5 x 3.8 cm. 8. Trace bilateral pleural effusions without obvious nodularity or other manifestations of pleural metastatic disease. 9. Evidence of prior left lower lobe wedge resection. 10. Other chronic, incidental, and postoperative findings as detailed above. Coronary artery disease. Emphysema (ICD10-J43.9). Aortic Atherosclerosis (ICD10-I70.0). Electronically Signed   By: Eddie Candle M.D.   On: 07/29/2019 08:29   MR LUMBAR SPINE WO CONTRAST  Result Date: 07/28/2019 CLINICAL DATA:  Back pain with left lower extremity weakness EXAM: MRI LUMBAR SPINE WITHOUT CONTRAST TECHNIQUE: Multiplanar, multisequence MR imaging of the lumbar spine was performed. No intravenous contrast was administered. COMPARISON:  04/24/2017 FINDINGS: Some sequences had to be omitted due to SAR restrictions related to the  patient's spinal stimulator. Segmentation: Standard. Alignment:  Physiologic. Vertebrae:  L3-5 PLIF.  No acute abnormality. Conus medullaris and cauda equina: Conus extends to the L1 level. Conus and cauda equina appear normal. Paraspinal and other soft tissues: Negative Disc levels: L1-L2: Normal disc space and facet joints. There is no spinal canal stenosis. No  neural foraminal stenosis. L2-L3: Mild disc bulge. Endplate spurring. There is no spinal canal stenosis. Mild bilateral neural foraminal stenosis. L3-L4: PLIF. There is no spinal canal stenosis. Limited visualization of the neural foramina due to susceptibility effects from spinal hardware. Within that limitation, no visible neural foraminal stenosis. L4-L5: PLIF. There is no spinal canal stenosis. Limited visualization of the neural foramina due to susceptibility effects from spinal hardware. Within that limitation, no visible neural foraminal stenosis. L5-S1: Normal disc space and facet joints. There is no spinal canal stenosis. No neural foraminal stenosis. Visualized sacrum: Normal. IMPRESSION: 1. Scan time had to be limited due to SAR restrictions imposed by the patient's spinal stimulator device. This caused the examination to be truncated and of reduced image quality. 2. L3-5 PLIF without spinal canal stenosis. No visible foraminal stenosis within limitations caused by adjacent hardware. 3. Mild bilateral L2-3 neural foraminal stenosis. Electronically Signed   By: Ulyses Jarred M.D.   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 CONTRAST:  169m OMNIPAQUE IOHEXOL 300 MG/ML  SOLN COMPARISON:  CT left hip, 07/29/2019, MR lumbar spine, 07/28/2019 FINDINGS: CT CHEST FINDINGS Cardiovascular: Aortic atherosclerosis. Normal heart size. Three-vessel coronary artery calcifications and stents. No pericardial effusion. Mediastinum/Nodes: No enlarged mediastinal, hilar, or axillary lymph nodes. There is thickening of the esophagus near the gastroesophageal  junction, likely a small hiatal hernia (series 5, image 73). Thyroid gland and trachea demonstrate no significant findings. Lungs/Pleura: Mild centrilobular emphysema. Trace bilateral pleural effusions and bandlike scarring or atelectasis of the bilateral lung bases. Evidence of prior left lower lobe wedge resection. Nonspecific 6 mm pulmonary nodule of the right lung base, new compared to remote prior CT dated 05/30/2006 (series 4, image 108). Musculoskeletal: Lytic lesion involving the spine of the right scapula measuring 2.6 x 1.9 cm (series 3, image 1). Subtle lytic lesions involving the left manubrium measuring 1.4 x 1.3 cm (series 3, image 11) and the inner table of the inferior manubrium measuring 1.9 x 1.4 cm (series 3, image 16). There is a lytic osseous lesion involving the left aspect of the T9 and T10 vertebral bodies with and extraosseous soft tissue component, measuring 3.9 x 3.6 cm at the level of T10 (series 3, image 43). CT ABDOMEN PELVIS FINDINGS Hepatobiliary: There are multiple low-attenuation lesions of the liver, the majority of which appear to be simple cysts and stable compared to remote prior examination dated 05/30/2006, however there are multiple additional, ill-defined hypodense lesions, for example of the liver dome measuring 1.7 x 1.1 cm (series 3, image 46) and of the anterior right lobe of the liver measuring 1.6 x 1.5 cm (series 3, image 63). No gallstones, gallbladder wall thickening, or biliary dilatation. Pancreas: Unremarkable. No pancreatic ductal dilatation or surrounding inflammatory changes. Spleen: Normal in size without significant abnormality. Adrenals/Urinary Tract: Right adrenal nodule measuring 2.2 x 2.1 cm,  enlarged compared to prior examination dated 05/30/2006 although low-attenuation, HU = 9, and very likely a benign adenoma (series 3, image 55). There are bilateral simple cysts of the kidneys. 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 Bladder is  unremarkable. Stomach/Bowel: Stomach is within normal limits. Appendix appears normal. No evidence of bowel wall thickening, distention, or inflammatory changes. Sigmoid diverticulosis. Moderate burden of stool balls throughout the colon. Vascular/Lymphatic: Severe aortic atherosclerosis. No enlarged abdominal or pelvic lymph nodes. Reproductive: No mass or other abnormality. Other: No abdominal wall hernia or abnormality. Evidence of prior right inguinal hernia repair. No abdominopelvic ascites. Musculoskeletal: Lytic lesion of the right aspect of the ilium abutting the sacroiliac joint measuring 3.0 x 1.9 cm (series 3, image 101). Previously described lytic lesions involving the left hemi sacrum and adjacent ilium, measuring 5.6 x 4.7 cm (series 3, image 100) and with a pathologic fracture of the left femoral neck, soft tissue mass measuring 5.5 x 3.8 cm (series 3, image 12). Lytic lesion of the right femoral neck with erosion of the posterior cortex measuring 4.0 x 3.5 cm (series 3, image 125). IMPRESSION: 1. Multifocal osseous metastatic disease as detailed above, including a previously identified pathologic fracture of the left femoral neck. There are multiple additional lesions of mechanical significance at risk for pathologic fracture, particularly of the right femoral neck and T9 and T10 vertebral bodies. 2. Ill-defined low-attenuation lesions of the liver consistent with hepatic metastatic disease. 3. There is no candidate primary lesion confidently identified in the chest, abdomen, or pelvis. 4. Thickening of the esophagus near the gastroesophageal junction, likely a small hiatal hernia, esophageal malignancy less favored and without secondary findings such as adjacent lymphadenopathy. 5. Nonspecific 6 mm pulmonary nodule of the right lung base, new compared to remote prior CT dated 05/30/2006 although generally an unlikely solitary manifestation of pulmonary metastatic disease. Attention on follow-up. 6.  Right adrenal nodule, which is enlarged compared to prior examination dated 05/30/2006 although low-attenuation and very likely a benign adenoma. Attention on follow-up. 7. There is a pathologic fracture of the left femoral neck with soft tissue mass measuring 5.5 x 3.8 cm. 8. Trace bilateral pleural effusions without obvious nodularity or other manifestations of pleural metastatic disease. 9. Evidence of prior left lower lobe wedge resection. 10. Other chronic, incidental, and postoperative findings as detailed above. Coronary artery disease. Emphysema (ICD10-J43.9). Aortic Atherosclerosis (ICD10-I70.0). Electronically Signed   By: Eddie Candle M.D.   On: 07/29/2019 08:29   CT HIP LEFT WO CONTRAST  Result Date: 07/29/2019 CLINICAL DATA:  Severe hip pain. EXAM: CT OF THE LEFT HIP WITHOUT CONTRAST TECHNIQUE: Multidetector CT imaging of the left hip was performed according to the standard protocol. Multiplanar CT image reconstructions were also generated. COMPARISON:  MRI dated June 2021 FINDINGS: Bones/Joint/Cartilage There is a lytic mass eroding the left sacrum and left iliac bone measuring approximately 6.5 cm. There is a lytic lesion involving the left femoral neck with an associated pathologic fracture. Ligaments Suboptimally assessed by CT. Muscles and Tendons The muscles are unremarkable where visualized. Soft tissues There is a small fat containing left inguinal hernia. There is scattered colonic diverticula without CT evidence for diverticulitis. The bladder is distended. IMPRESSION: 1. Lytic lesion involving the left femoral neck with associated pathologic fracture. 2. There is an additional lytic lesion in the left iliac bone and left sacrum. 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 3. Diverticulosis without CT evidence for diverticulitis. 4. Distended urinary bladder. 5. Fat  containing left inguinal hernia. These results will be called to the ordering clinician or  representative by the Radiologist Assistant, and communication documented in the PACS or [employer]. Electronically Signed   By: Constance Holster M.D.   On: 07/29/2019 03:27   CT FEMUR LEFT WO CONTRAST  Result Date: 07/30/2019 CLINICAL DATA:  Pathologic fracture of the left femur EXAM: CT OF THE LOWER LEFT EXTREMITY WITHOUT CONTRAST TECHNIQUE: Multidetector CT imaging of the lower left extremity was performed according to the standard protocol. COMPARISON:  CT and x-ray 07/29/2019 FINDINGS: Bones/Joint/Cartilage Expansile lytic mass centered at the left femoral neck with comminuted pathologic fracture involving the basicervical and intertrochanteric aspects of the femur. Fracture is mildly impacted. No significant displacement or angulation. Left femoral neck mass with prominent soft tissue component emanating from the posterior cortex in total measures approximately 5.6 x 4.2 x 4.5 cm (series 2, image 30; series 5, image 50). Known lytic lesion of the left ilium is partially visualized at the edge of the field of view (series 1, image 3), more fully characterized on CT abdomen-pelvis 07/29/2019. No additional lytic or sclerotic bony lesions are seen within the field of view. Mild joint space narrowing of the left hip. No dislocation. No evidence of femoral head avascular necrosis. Ligaments Suboptimally assessed by CT. Muscles and Tendons Soft tissue mass results in mass effect on the adjacent left quadratus femoris muscle without obvious muscular invasion, although evaluation is limited with noncontrast CT. Musculotendinous structures appear intact within the limitations of CT. Soft tissues No additional soft tissue masses. No fluid collection. Sigmoid diverticulosis is noted. IMPRESSION: 1. Expansile lytic mass centered at the left femoral neck with comminuted pathologic fracture involving the basicervical and intertrochanteric aspects of the femur. Fracture is mildly impacted. No significant  displacement or angulation. 2. Known lytic lesion of the left ilium is partially visualized at the edge of the field of view, more fully characterized on CT abdomen-pelvis 07/29/2019. Electronically Signed   By: Davina Poke D.O.   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 COMPARISON:  CT 07/29/2019 FINDINGS: Bones/Joint/Cartilage Lytic mildly expansile mass centered within the intertrochanteric aspect of the proximal right femur measuring 4.6 x 3.4 x 5.7 cm (series 3, image 33; series 9, image 72). There is significant cortical breakthrough of the posterior and superior cortex of the femoral neck (series 3, image 32; series 7, image 66). Mass occupies the full diameter of the inferior femoral neck. Small extraosseous soft tissue component along the posterior femoral neck is suggested with mild mass effect on the traversing quadratus femoris muscle. No complete pathologic fracture is evident at this time. Known lytic mass in the posterior right ilium was not included within the field of view. Ill-defined area of lucency within the central aspect of the distal femoral metaphysis measuring 1.7 x 1.1 x 1.1 cm (series 3, image 155; series 7, image 62) is nonspecific and may reflect a area of prominent marrow fat. A small lytic lesion not excluded. No additional areas of lytic or sclerotic bone lesion is identified. Ligaments Suboptimally assessed by CT. Muscles and Tendons No acute myotendinous abnormality. Soft tissues No soft tissue fluid collection. IMPRESSION: 1. Lytic mildly expansile mass centered within the intertrochanteric aspect of the  proximal right femur measuring up to 5.7 cm with significant cortical breakthrough of the  posterior and superior cortex of the femoral neck. Small extraosseous soft tissue component along the posterior femoral neck is suggested with mild mass effect on the traversing quadratus femoris muscle. This lesion is at high risk of pathologic fracture. 2. Ill-defined 1.7 cm area of lucency within the central aspect of the distal femoral metaphysis is nonspecific and may reflect a area of prominent marrow fat. A small lytic lesion not excluded. MRI could be performed for confirmation, as clinically indicated. Electronically Signed   By: Davina Poke D.O.   On: 07/30/2019 08:36   DG C-Arm 1-60 Min  Result Date: 08/02/2019 CLINICAL DATA:  Right intramedullary femoral IM nail for impending femoral fracture. EXAM: RIGHT FEMUR 2 VIEWS; DG C-ARM 1-60 MIN COMPARISON:  Preoperative imaging. FINDINGS: Six fluoroscopic spot views obtained in the operating room in frontal and lateral projections. Placement of intramedullary nail with trans trochanteric and distal locking screws. Known osseous metastatic disease is not well visualized on fluoroscopic spot views. Fluoroscopy time 1 minutes 43 seconds. Dose 7.30 mGy. IMPRESSION: Fluoroscopic spot views during right femoral intramedullary nail placement. Electronically Signed   By: Keith Rake M.D.   On: 08/02/2019 15:30   DG HIP OPERATIVE UNILAT W OR W/O PELVIS LEFT  Result Date: 07/30/2019 CLINICAL DATA:  Left hemiarthroplasty. EXAM: OPERATIVE LEFT HIP (WITH PELVIS IF PERFORMED) TECHNIQUE: Fluoroscopic spot image(s) were submitted for interpretation post-operatively. COMPARISON:  Radiograph yesterday. FINDINGS: Three spot images obtained in the operating room during left hip arthroplasty. Fluoroscopy time not provided. IMPRESSION: Three spot images obtained in the operating room during left hip arthroplasty. Electronically Signed   By: Keith Rake M.D.   On: 07/30/2019 18:25   DG HIP UNILAT WITH PELVIS 2-3 VIEWS LEFT  Result Date: 07/29/2019 CLINICAL  DATA:  Recent falls EXAM: DG HIP (WITH OR WITHOUT PELVIS) 2-3V LEFT COMPARISON:  None. FINDINGS: Frontal pelvis as well as frontal and lateral left hip images were obtained. There is a comminuted fracture of the basicervical and intertrochanteric regions the proximal left femur. There is slight displacement of fracture fragments at the basicervical femoral neck region. There is inferior avulsion of the lesser trochanter. No other fractures are evident. No dislocation. There is mild symmetric narrowing of each hip joint. Postoperative change noted at L4, L5, S1. A stimulator device is present over the right iliac crest. There are surgical clips in the right inguinal region. IMPRESSION: Comminuted fracture of the proximal left femur with basicervical intertrochanteric components. Avulsion lesser trochanter. Mild displacement of fracture fragments along the lateral portion of the basicervical portion of the fracture. No other fractures. No dislocation. Mild symmetric narrowing each hip joint. Electronically Signed   By: Lowella Grip III M.D.   On: 07/29/2019 13:17   DG FEMUR MIN 2 VIEWS LEFT  Result Date: 07/29/2019 CLINICAL DATA:  Proximal femur fracture following fall EXAM: LEFT FEMUR 2 VIEWS COMPARISON:  Pelvis and left hip radiographs July 29, 2019 FINDINGS: Frontal and lateral views were obtained. There is a comminuted basicervical and intertrochanteric portions of the proximal left femur. No inferior displacement of the lesser trochanter. No more distal fractures are evident. No dislocation. There is mild narrowing of the left hip joint. The knee joint region appears normal. No joint effusion. Foci of arterial vascular calcification noted. IMPRESSION: Comminuted fracture proximal left femur. Note that there is avulsion with inferior displacement of the lesser trochanter on the left. No new more  distal fractures are evident. No dislocation. Mild narrowing left hip joint. Foci of arterial vascular  calcification noted. Electronically Signed   By: Lowella Grip III M.D.   On: 07/29/2019 13:15   DG FEMUR, MIN 2 VIEWS RIGHT  Result Date: 08/02/2019 CLINICAL DATA:  Right intramedullary femoral IM nail for impending femoral fracture. EXAM: RIGHT FEMUR 2 VIEWS; DG C-ARM 1-60 MIN COMPARISON:  Preoperative imaging. FINDINGS: Six fluoroscopic spot views obtained in the operating room in frontal and lateral projections. Placement of intramedullary nail with trans trochanteric and distal locking screws. Known osseous metastatic disease is not well visualized on fluoroscopic spot views. Fluoroscopy time 1 minutes 43 seconds. Dose 7.30 mGy. IMPRESSION: Fluoroscopic spot views during right femoral intramedullary nail placement. Electronically Signed   By: Keith Rake M.D.   On: 08/02/2019 15:30    ASSESSMENT AND PLAN: 1.  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  PET scan 12/30/2018-solid left lower lobe nodule, 2 cm with SUV of 3.6, additional subcentimeter pulmonary nodules below PET threshold, liver cyst and too small to characterize liver lesions, focal FDG activity in the left prostate  02/24/2019-left lower lobe segmentectomy and mediastinal lymphadenectomy at [facility]  Pathology-lymph nodes negative, though a level 13 lymph node had a focus of atypical cells within fibrotic tissue subjacent to the lymphoid tissue morphologically similar to the lung tumor, left lower lobe segment 8 segmentectomy-high-grade carcinoma with epithelial and myoepithelial differentiation, 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  Pathologic fracture nonspecific 6 mm right base nodule-new from April 2008 of the left femoral neck             -Status post left hip hemiarthroplasty on 07/30/2019 3.  Impending fracture of the right femur             -Status post intramedullary fixation of the right femur on 08/02/2019 4.  Hypertension 5.  COPD 6.  Paroxysmal atrial fibrillation 7.  CAD 8.  Hyperlipidemia  9.  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 Baldus appears stable.  Pain was overall better controlled but worsened after sitting up in the recliner.  He is on MS Contin and Dilaudid for breakthrough pain.  He will begin palliative radiation later today.  Tissue has been sent for PD-L1 testing and Foundation One testing.  He will be discharging to home later today after radiation.  He is interested in hearing more about treatment with immunotherapy.  Outpatient follow-up has been arranged.  Recommendations: 1.  Proceed with palliative radiation as scheduled. 2.  We will follow up on PD-L1 testing. 3.  Continue current pain medications and titrate as needed.  We will need to discontinue dexamethasone if he opts to pursue immunotherapy. 4.  He will also be considered for bisphosphonate therapy as an outpatient. 5.  Outpatient follow-up has been scheduled at the cancer center on 08/19/2019 to coordinate with his radiation appointment that day.   LOS: 11 days   Mikey Bussing, DNP, AGPCNP-BC, AOCNP 08/09/19 I saw Mr. Summons early this morning.  His pain was  under good control at the time.  He reports that he would like to go home.  He will consider systemic treatment for the metastatic lung cancer.  We are waiting on results from molecular testing. I agree with continuing MS Contin and Dilaudid at discharge.  Julieanne Manson, MD

## 2019-08-09 NOTE — Progress Notes (Signed)
Physical Therapy Treatment Patient Details Name: Greg Hughes MRN: [id number] DOB: 1940/09/08 Today's Date: 08/09/2019    History of Present Illness Patient is a 79 year old male S/P left hemi arthroplasty follwing a pathological fracture on the left. 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 He underwent prophylactic R IM nail for this on 08/02/19.  He also has Metastatic disease of the spine. PMH: tobbaco abuse, right arm weakness, hernia, cataracts, HTN    PT Comments    Patient progressing slowly towards PT goals. Reports being in bed all weekend long. States that pain has improved but noted to be worsened with activity. Tolerated gait training with Mod A for balance/safety and use of RW for support. Tolerated there ex and eager to mobilize this morning. Discharge recommendation has been updated to home with possible hospice. Will need 24/7 supervision especially for mobility as pt is a high fall risk. Reviewed posterior hip precautions as pt not able to recall them from prior session. Will continue to follow.   Follow Up Recommendations  Home health PT;Supervision for mobility/OOB;Supervision/Assistance - 24 hour     Equipment Recommendations  None recommended by PT (DME already delivered)    Recommendations for Other Services       Precautions / Restrictions Precautions Precautions: Posterior Hip;Fall Precaution Booklet Issued: Yes (comment) Precaution Comments: reviewed precautions as pt does not remember Restrictions Weight Bearing Restrictions: Yes RLE Weight Bearing: Weight bearing as tolerated LLE Weight Bearing: Weight bearing as tolerated    Mobility  Bed Mobility Overal bed mobility: Needs Assistance Bed Mobility: Supine to Sit     Supine to sit: HOB elevated;Min assist     General bed mobility comments: Able to get to EOB with use of rail and min A for scooting. + dizziness, resolves.  Transfers Overall  transfer level: Needs assistance Equipment used: Rolling walker (2 wheeled) Transfers: Sit to/from Stand Sit to Stand: Mod assist;From elevated surface         General transfer comment: Assist to power to standing with cues for hand placement/technique, Posterior LOB once upright with some instability. Cues to shift weight forward.  Ambulation/Gait Ambulation/Gait assistance: Mod assist;+2 safety/equipment Gait Distance (Feet): 10 Feet Assistive device: Rolling walker (2 wheeled) Gait Pattern/deviations: Step-to pattern;Shuffle;Trunk flexed;Decreased stride length;Decreased step length - left;Decreased stance time - right;Step-through pattern Gait velocity: decreased   General Gait Details: Slow, unsteady gait with constant assist for balance due to shaking and LOB posteriorly. Worsening pain everywhere with movement. Assist to weight shift to progress steps forward. Limited by pain,.   Stairs             Wheelchair Mobility    Modified Rankin (Stroke Patients Only)       Balance Overall balance assessment: Needs assistance Sitting-balance support: Feet supported;Bilateral upper extremity supported Sitting balance-Leahy Scale: Poor Sitting balance - Comments: Requires Ue support on RW for balance.   Standing balance support: During functional activity Standing balance-Leahy Scale: Poor Standing balance comment: UE support and external assist needed                            Cognition Arousal/Alertness: Awake/alert Behavior During Therapy: WFL for tasks assessed/performed Overall Cognitive Status: Impaired/Different from baseline Area of Impairment: Memory;Following commands;Problem solving;Safety/judgement                     Memory: Decreased short-term memory;Decreased recall of precautions Following Commands:  Follows one step commands with increased time Safety/Judgement: Decreased awareness of safety;Decreased awareness of deficits    Problem Solving: Slow processing;Requires verbal cues;Decreased initiation;Difficulty sequencing;Requires tactile cues General Comments: confusion has improved but pt with STM deficits as well as need for slow instructions and increased time to follow      Exercises General Exercises - Lower Extremity Ankle Circles/Pumps: AROM;Both;Supine;10 reps Toe Raises: AROM;Both;10 reps;Seated Heel Raises: AROM;Both;10 reps;Seated    General Comments        Pertinent Vitals/Pain Pain Assessment: Faces Faces Pain Scale: Hurts whole lot Pain Location: back, LEs with movement Pain Descriptors / Indicators: Aching;Grimacing;Guarding;Sharp Pain Intervention(s): Monitored during session;Repositioned;Limited activity within patient's tolerance    Home Living                      Prior Function            PT Goals (current goals can now be found in the care plan section) Progress towards PT goals: Progressing toward goals (slowly)    Frequency    Min 3X/week      PT Plan Current plan remains appropriate;Discharge plan needs to be updated    Co-evaluation              AM-PAC PT "6 Clicks" Mobility   Outcome Measure  Help needed turning from your back to your side while in a flat bed without using bedrails?: A Little Help needed moving from lying on your back to sitting on the side of a flat bed without using bedrails?: A Lot Help needed moving to and from a bed to a chair (including a wheelchair)?: A Lot Help needed standing up from a chair using your arms (e.g., wheelchair or bedside chair)?: A Lot Help needed to walk in hospital room?: A Lot Help needed climbing 3-5 steps with a railing? : Total 6 Click Score: 12    End of Session Equipment Utilized During Treatment: Gait belt Activity Tolerance: Patient limited by pain;Patient tolerated treatment well Patient left: in chair;with call bell/phone within reach;with chair alarm set Nurse Communication: Mobility  status PT Visit Diagnosis: Unsteadiness on feet (R26.81);Pain;Difficulty in walking, not elsewhere classified (R26.2);Muscle weakness (generalized) (M62.81) Pain - Right/Left:  (both) Pain - part of body: Hip (back)     Time: 5188-4166 PT Time Calculation (min) (ACUTE ONLY): 22 min  Charges:  $Therapeutic Activity: 8-22 mins                     Marisa Severin, PT, DPT Acute Rehabilitation Services Pager [phone] Office [phone]       Marguarite Arbour A Sabra Heck 08/09/2019, 1:16 PM

## 2019-08-09 NOTE — Progress Notes (Signed)
Noted that palliative care met with patient and made significant improvement in pain control which is much appreciated. Pt is DNR, considering options. Would recommend proceeding with palliative radiotherapy as well for pain management and quality of life due to risks of cord compression. If performance status improved, should also meet with medical oncology to discuss options. Please notify our service directly if there is a change in this plan.     Carola Rhine, PAC

## 2019-08-09 NOTE — Progress Notes (Addendum)
1745 Received pt back from [facility] post radiation.Called [facility] for transportation to go home. 16 [facility] came in, discharged pt to home. Discharge instructions given to pt's wife.

## 2019-08-09 NOTE — Progress Notes (Signed)
Daily Progress Note   Patient Name: Greg Hughes       Date: 08/09/2019 DOB: 24-Sep-1940  Age: 79 y.o. MRN#: [id number] Attending Physician: Hosie Poisson, MD Primary Care Physician: Tonia Ghent, MD Admit Date: 07/28/2019  Reason for Consultation/Follow-up: To discuss complex medical decision making related to patient's goals of care  Spoke with patient and his wife about radiation and pain medication as well as outpatient Palliative follow up.  Subjective: Patient reports bowels are moving well and pain is better controlled.  His wife feels that comfort is the priority.  She wants to get him home.  We discussed the radiation therapy.  She is considering immunotherapy afterward.  She would like for him to receive care in his home with Palliative follow up.   Assessment: A&O. Stable. Going for radiation thru mid July per wife.  Patient Profile/HPI:   Per intake H&P --> 79 year old male with CAD, BPH, OSA, paroxysmal A. fib on Xarelto, recent hospitalization at [facility] in January 2021 for a lung nodule status post resection (I am unable to find pathology reports), came to the hospital with severe low back pain, hip pain and inability to walk. CT revealed multiple metastatic osteolytic lesions and a pathological fractureto the left hip. Patient underwent a left hip hemiarthroplasty as well as prophylactic IM nailing of the right femur. Today is going to [facility] for radiation simulation.   Palliative care was asked to get involved to aid in symptom relief.     Length of Stay: 11   Vital Signs: BP (!) 141/82 (BP Location: Right Arm)   Pulse 64   Temp 97.8 F (36.6 C) (Oral)   Resp 16   Ht '5\' 10"'  (1.778 m)   Wt 79.3 kg   SpO2 95%   BMI 25.08 kg/m  SpO2: SpO2: 95 % O2 Device: O2 Device:  Room Air O2 Flow Rate:        Goals:   Cardiopulmonary Resuscitation: Do Not Attempt Resuscitation (DNR/No CPR)  Medical Interventions: Comfort Measures: Keep clean, warm, and dry. Use medication by any route, positioning, wound care, and other measures to relieve pain and suffering. Use oxygen, suction and manual treatment of airway obstruction as needed for comfort. Do not transfer to the hospital unless comfort needs cannot be met in current location.  Antibiotics: Determine  use of limitation of antibiotics when infection occurs  IV Fluids: IV fluids for a defined trial period  Feeding Tube: No feeding tube     Palliative Care Plan    Recommendations/Plan:  Give oral dilaudid prior to radiation treatment.  Pain control is a top priority.  On discharge please continue current oral regimen including:    MS Contin 15 mg q 12 hours  Dilaudid 2 mg PO q 3 PRN breakthru  Lyrica  Lidocaine patches  Outpatient Palliative follow up requested. Code Status:  DNR  Prognosis:   < 6 months   Discharge Planning:  Home with [place] was discussed with patient and wife.  Thank you for allowing the Palliative Medicine Team to assist in the care of this patient.  Total time spent:  35 min.     Greater than 50%  of this time was spent counseling and coordinating care related to the above assessment and plan.  Florentina Jenny, PA-C Palliative Medicine  Please contact Palliative MedicineTeam phone at [phone] for questions and concerns between 7 am - 7 pm.   Please see AMION for individual provider pager numbers.

## 2019-08-10 ENCOUNTER — Ambulatory Visit
Admission: RE | Admit: 2019-08-10 | Discharge: 2019-08-10 | Disposition: A | Discharge status: Home / Self Care | Payer: Medicare HMO | Source: Ambulatory Visit | Attending: Radiation Oncology | Admitting: Radiation Oncology

## 2019-08-10 DIAGNOSIS — M2550 Pain in unspecified joint: Secondary | ICD-10-CM | POA: Diagnosis not present

## 2019-08-10 DIAGNOSIS — Z7401 Bed confinement status: Secondary | ICD-10-CM | POA: Diagnosis not present

## 2019-08-10 DIAGNOSIS — R5381 Other malaise: Secondary | ICD-10-CM | POA: Diagnosis not present

## 2019-08-10 DIAGNOSIS — C7951 Secondary malignant neoplasm of bone: Secondary | ICD-10-CM | POA: Diagnosis present

## 2019-08-10 DIAGNOSIS — C3432 Malignant neoplasm of lower lobe, left bronchus or lung: Secondary | ICD-10-CM | POA: Diagnosis not present

## 2019-08-10 DIAGNOSIS — R29898 Other symptoms and signs involving the musculoskeletal system: Secondary | ICD-10-CM | POA: Diagnosis not present

## 2019-08-10 DIAGNOSIS — Z510 Encounter for antineoplastic radiation therapy: Secondary | ICD-10-CM | POA: Diagnosis not present

## 2019-08-11 ENCOUNTER — Other Ambulatory Visit: Payer: Self-pay

## 2019-08-11 ENCOUNTER — Telehealth: Payer: Self-pay

## 2019-08-11 ENCOUNTER — Other Ambulatory Visit: Payer: Medicare HMO

## 2019-08-11 ENCOUNTER — Telehealth: Payer: Self-pay | Admitting: Family Medicine

## 2019-08-11 ENCOUNTER — Ambulatory Visit
Admission: RE | Admit: 2019-08-11 | Discharge: 2019-08-11 | Disposition: A | Discharge status: Home / Self Care | Payer: Medicare HMO | Source: Ambulatory Visit | Attending: Radiation Oncology | Admitting: Radiation Oncology

## 2019-08-11 DIAGNOSIS — R0902 Hypoxemia: Secondary | ICD-10-CM | POA: Diagnosis not present

## 2019-08-11 DIAGNOSIS — M2550 Pain in unspecified joint: Secondary | ICD-10-CM | POA: Diagnosis not present

## 2019-08-11 DIAGNOSIS — Z7401 Bed confinement status: Secondary | ICD-10-CM | POA: Diagnosis not present

## 2019-08-11 DIAGNOSIS — R29898 Other symptoms and signs involving the musculoskeletal system: Secondary | ICD-10-CM | POA: Diagnosis not present

## 2019-08-11 DIAGNOSIS — C7951 Secondary malignant neoplasm of bone: Secondary | ICD-10-CM | POA: Diagnosis not present

## 2019-08-11 DIAGNOSIS — Z510 Encounter for antineoplastic radiation therapy: Secondary | ICD-10-CM | POA: Diagnosis not present

## 2019-08-11 DIAGNOSIS — R000 Tachycardia, unspecified: Secondary | ICD-10-CM | POA: Diagnosis not present

## 2019-08-11 DIAGNOSIS — C3432 Malignant neoplasm of lower lobe, left bronchus or lung: Secondary | ICD-10-CM | POA: Diagnosis not present

## 2019-08-11 MED ORDER — TRAZODONE HCL 50 MG PO TABS: 50.0000 mg | ORAL_TABLET | Freq: Every evening | ORAL | 6 refills | Status: DC | PRN

## 2019-08-11 MED ORDER — MORPHINE SULFATE ER 15 MG PO TBCR: 15.0000 mg | EXTENDED_RELEASE_TABLET | Freq: Two times a day (BID) | ORAL | 0 refills | Status: AC

## 2019-08-11 NOTE — Telephone Encounter (Signed)
Spoke with Virgie of [facility] relaying Dr. Synthia Innocent message.  She verbalizes understanding and will inform pt's spouse.

## 2019-08-11 NOTE — Telephone Encounter (Signed)
Since Dr Damita Dunnings is out of office can you look at this please    Virgie with [facility] called in regards to patient's medication   She stated that the patient was just released to go home and will need to scripts filled today since he is almost out. She stated patient has bone cancer and these are medication that he can not go without  traZODone (DESYREL) 50 MG tablet  morphine (MS CONTIN) 15 MG 12 hr tablet   [place] is requesting a call back [phone]

## 2019-08-11 NOTE — Progress Notes (Signed)
PATIENT NAME: Greg Hughes DOB: 1940/07/19 MRN: [id number]  PRIMARY CARE PROVIDER: Tonia Ghent, MD  RESPONSIBLE PARTY:  Acct ID - Guarantor Home Phone Work Phone Relationship Acct Type  [id number] Georgiann [phone]  Self P/F     [address], [place], [place] [address]    PLAN OF CARE and INTERVENTIONS:               1.  GOALS OF CARE/ ADVANCE CARE PLANNING:  Patient and wife want patient to be comfortable.               2.  PATIENT/CAREGIVER EDUCATION:  Education on palliative care services, education on  disease progression, education on fall precautions, symptom management of pain, support                3.  DISEASE STATUS: SW [place] and RN made scheduled palliative care home visit. Palliative care team met with patients wife [name] as well as patient when he returned home from receiving radiation. Patient is being transported via [facility] to radiation everyday. Wife states patient is scheduled to receive 20 treatments of radiation. Patient is receiving radiation on upper body then receive radiation on spine and hip in the near future.  Patient's past medical history includes but not limited to pathological fracture in neoplastic disease, pathological fracture of femoral neck, aortic atherosclerosis, hypertension, A-fib, CHF, coronary artery disease involving native coronary artery, COPD, adrenal adenoma, hematuria, BPH, cystic disease of kidney, hyperlipidemia, tobacco abuse, GOUT, lung nodules, history of lung cancer, spinal stenosis, history of melanoma, low back pain and pain from bone metastases. Patient is 5'11 and current weight is 178 lbs. Wife reports patient has had a 20 - 30 pound weight loss. Wife reports patient had spinal stimulator placed in January. Wife reports stimulator work well for 4 months and then patient begin having increased pain. Wife reports she went to help patient get up and patient yelled out in pain. Patient was taken to the hospital  where he was diagnosed with bone cancer with hip fracture of left hip. Wife reports patient had surgery on left hip then went back into surgery next day to have rod placed in right hip. Patient arrived home from radiation.  Patient alert and oriented. Patient currently rates pain at 4 and and reports pain is in his back. Patient takes MS Contin bid as well as Hydromorphone for breakthrough pain. Patient only had 3 days of MS Contin.  Nurse placed call to Dr Josefine Class office. Dr. Damita Dunnings not working today however Dr. Danise Mina sent RX for MS Contin as well as Trazodone to [facility]. Patient denies having any shortness of breath or cough.  Wife reports patients appetite has improved since patient got home from the hospital on Monday. Patient had a hamburger patty and macaroni and cheese last PM.  Patients breath sounds are clear. Patient has no edema in his lower extremities. Patients bowels have not moved in a couple of days.  Wife to increase Senna S to 2 tabs bid and reports  she will administer Fleets enema to patient if bowels do not move by tomorrow.  SW placed call to [facility] home health as patient is scheduled to receive PT and OT services.  [place] scheduled to make home visit on Friday. Patient has a DNR in the home. Patient and wife in agreement with palliative care services. Nurse reviewed patient's medications with wife and wife made nurse copy of hospital discharge medications and  gave to RN. Support provided to patient and wife. Patient and wife encouraged to contact palliative care with questions or concerns.       HISTORY OF PRESENT ILLNESS:  Patient is a 79 year old patient with bone cancer.  Patient currently receiving radiation therapy.  Patient and wife open to palliative care services. Patient will be seen monthly and PRN      CODE STATUS: DNR  ADVANCED DIRECTIVES: Y MOST FORM: N PPS: 30%   PHYSICAL EXAM:   VITALS: Today's Vitals   08/11/19 1510  BP: 130/90  Pulse: 61   Resp: 16  Temp: 97.8 F (36.6 C)  TempSrc: Temporal  SpO2: 96%  Weight: 178 lb (80.7 kg)  Height: '5\' 11"'  (3.833 m)  PainSc: 4   PainLoc: Back    LUNGS: decreased breath sounds CARDIAC: Cor RRR  EXTREMITIES: none edema SKIN: Skin color, texture, turgor normal. No rashes or lesions  NEURO: positive for gait problems/ weakness       Nilda Simmer, RN

## 2019-08-11 NOTE — Telephone Encounter (Signed)
plz notify trazodone was refilled for 1 month with several refills and MS contin was refilled for 2 weeks.  Will cc PCP for future refills of MS contin.

## 2019-08-11 NOTE — Telephone Encounter (Signed)
I thank all involved.  Please check on patient tomorrow about his pain control and let me know.  Thanks.

## 2019-08-12 ENCOUNTER — Ambulatory Visit
Admission: RE | Admit: 2019-08-12 | Discharge: 2019-08-12 | Disposition: A | Discharge status: Home / Self Care | Payer: Medicare HMO | Source: Ambulatory Visit | Attending: Radiation Oncology | Admitting: Radiation Oncology

## 2019-08-12 DIAGNOSIS — C3432 Malignant neoplasm of lower lobe, left bronchus or lung: Secondary | ICD-10-CM | POA: Diagnosis not present

## 2019-08-12 DIAGNOSIS — R5381 Other malaise: Secondary | ICD-10-CM | POA: Diagnosis not present

## 2019-08-12 DIAGNOSIS — M2550 Pain in unspecified joint: Secondary | ICD-10-CM | POA: Diagnosis not present

## 2019-08-12 DIAGNOSIS — Z510 Encounter for antineoplastic radiation therapy: Secondary | ICD-10-CM | POA: Diagnosis not present

## 2019-08-12 DIAGNOSIS — C7951 Secondary malignant neoplasm of bone: Secondary | ICD-10-CM | POA: Diagnosis not present

## 2019-08-12 DIAGNOSIS — Z7401 Bed confinement status: Secondary | ICD-10-CM | POA: Diagnosis not present

## 2019-08-12 NOTE — Telephone Encounter (Signed)
Left message on patient's voicemail to return call with information.

## 2019-08-12 NOTE — Telephone Encounter (Signed)
Wife says his pain control has been pretty good yesterday and today as long as he gets his Dilaudid every 3 hours.

## 2019-08-13 ENCOUNTER — Ambulatory Visit
Admission: RE | Admit: 2019-08-13 | Discharge: 2019-08-13 | Disposition: A | Discharge status: Home / Self Care | Payer: Medicare HMO | Source: Ambulatory Visit | Attending: Radiation Oncology | Admitting: Radiation Oncology

## 2019-08-13 ENCOUNTER — Encounter (HOSPITAL_COMMUNITY): Payer: Self-pay | Admitting: Oncology

## 2019-08-13 DIAGNOSIS — R399 Unspecified symptoms and signs involving the genitourinary system: Secondary | ICD-10-CM | POA: Diagnosis not present

## 2019-08-13 DIAGNOSIS — C7951 Secondary malignant neoplasm of bone: Secondary | ICD-10-CM | POA: Diagnosis not present

## 2019-08-13 DIAGNOSIS — Z510 Encounter for antineoplastic radiation therapy: Secondary | ICD-10-CM | POA: Diagnosis not present

## 2019-08-13 DIAGNOSIS — M2550 Pain in unspecified joint: Secondary | ICD-10-CM | POA: Diagnosis not present

## 2019-08-13 DIAGNOSIS — C3432 Malignant neoplasm of lower lobe, left bronchus or lung: Secondary | ICD-10-CM | POA: Diagnosis not present

## 2019-08-13 DIAGNOSIS — C3490 Malignant neoplasm of unspecified part of unspecified bronchus or lung: Secondary | ICD-10-CM | POA: Diagnosis not present

## 2019-08-13 DIAGNOSIS — R001 Bradycardia, unspecified: Secondary | ICD-10-CM | POA: Diagnosis not present

## 2019-08-13 DIAGNOSIS — M84552D Pathological fracture in neoplastic disease, left femur, subsequent encounter for fracture with routine healing: Secondary | ICD-10-CM | POA: Diagnosis not present

## 2019-08-13 DIAGNOSIS — J432 Centrilobular emphysema: Secondary | ICD-10-CM | POA: Diagnosis not present

## 2019-08-13 DIAGNOSIS — I2510 Atherosclerotic heart disease of native coronary artery without angina pectoris: Secondary | ICD-10-CM | POA: Diagnosis not present

## 2019-08-13 DIAGNOSIS — I110 Hypertensive heart disease with heart failure: Secondary | ICD-10-CM | POA: Diagnosis not present

## 2019-08-13 DIAGNOSIS — Z7401 Bed confinement status: Secondary | ICD-10-CM | POA: Diagnosis not present

## 2019-08-13 DIAGNOSIS — R52 Pain, unspecified: Secondary | ICD-10-CM | POA: Diagnosis not present

## 2019-08-13 DIAGNOSIS — I480 Paroxysmal atrial fibrillation: Secondary | ICD-10-CM | POA: Diagnosis not present

## 2019-08-13 DIAGNOSIS — M109 Gout, unspecified: Secondary | ICD-10-CM | POA: Diagnosis not present

## 2019-08-13 DIAGNOSIS — I5032 Chronic diastolic (congestive) heart failure: Secondary | ICD-10-CM | POA: Diagnosis not present

## 2019-08-13 DIAGNOSIS — N401 Enlarged prostate with lower urinary tract symptoms: Secondary | ICD-10-CM | POA: Diagnosis not present

## 2019-08-13 DIAGNOSIS — R5381 Other malaise: Secondary | ICD-10-CM | POA: Diagnosis not present

## 2019-08-13 MED ORDER — TRAZODONE HCL 50 MG PO TABS: 50.0000 mg | ORAL_TABLET | Freq: Every evening | ORAL | 6 refills | Status: AC | PRN

## 2019-08-13 NOTE — Telephone Encounter (Signed)
Called pt.  Pain is controlled but needed refill on trazodone.  Sent rx.  They'll update me as needed.   Condolences offered re: recent dx and support offered.   Pt and wife thanked me for the call.  I thanked them for talking with me.   App help of all involved.

## 2019-08-13 NOTE — Telephone Encounter (Signed)
none

## 2019-08-13 NOTE — Addendum Note (Signed)
Addended by: Tonia Ghent on: 08/13/2019 05:50 PM   Modules accepted: Orders

## 2019-08-14 DIAGNOSIS — Z510 Encounter for antineoplastic radiation therapy: Secondary | ICD-10-CM | POA: Diagnosis not present

## 2019-08-14 DIAGNOSIS — C7951 Secondary malignant neoplasm of bone: Secondary | ICD-10-CM | POA: Diagnosis not present

## 2019-08-14 DIAGNOSIS — C3432 Malignant neoplasm of lower lobe, left bronchus or lung: Secondary | ICD-10-CM | POA: Diagnosis not present

## 2019-08-16 ENCOUNTER — Telehealth: Payer: Self-pay | Admitting: Family Medicine

## 2019-08-16 ENCOUNTER — Ambulatory Visit
Admission: RE | Admit: 2019-08-16 | Discharge: 2019-08-16 | Disposition: A | Discharge status: Home / Self Care | Payer: Medicare HMO | Source: Ambulatory Visit | Attending: Radiation Oncology | Admitting: Radiation Oncology

## 2019-08-16 DIAGNOSIS — C3432 Malignant neoplasm of lower lobe, left bronchus or lung: Secondary | ICD-10-CM | POA: Diagnosis not present

## 2019-08-16 DIAGNOSIS — R5381 Other malaise: Secondary | ICD-10-CM | POA: Diagnosis not present

## 2019-08-16 DIAGNOSIS — R531 Weakness: Secondary | ICD-10-CM | POA: Diagnosis not present

## 2019-08-16 DIAGNOSIS — C7951 Secondary malignant neoplasm of bone: Secondary | ICD-10-CM | POA: Diagnosis not present

## 2019-08-16 DIAGNOSIS — Z7401 Bed confinement status: Secondary | ICD-10-CM | POA: Diagnosis not present

## 2019-08-16 DIAGNOSIS — Z510 Encounter for antineoplastic radiation therapy: Secondary | ICD-10-CM | POA: Diagnosis not present

## 2019-08-16 DIAGNOSIS — M2550 Pain in unspecified joint: Secondary | ICD-10-CM | POA: Diagnosis not present

## 2019-08-16 NOTE — Telephone Encounter (Signed)
Beth with [facility] called and left a voicemail requesting verbal orders for Nursing Eval and assistance 1x wk x 4 weeks for Pain Management and Metastatic disease management.   Please advise, thanks.

## 2019-08-16 NOTE — Telephone Encounter (Signed)
Beth with [facility] advised.

## 2019-08-17 ENCOUNTER — Ambulatory Visit: Payer: Medicare HMO

## 2019-08-18 ENCOUNTER — Telehealth: Payer: Self-pay

## 2019-08-18 ENCOUNTER — Telehealth: Payer: Self-pay | Admitting: Family Medicine

## 2019-08-18 ENCOUNTER — Ambulatory Visit: Payer: Medicare HMO

## 2019-08-18 DIAGNOSIS — J432 Centrilobular emphysema: Secondary | ICD-10-CM | POA: Diagnosis not present

## 2019-08-18 DIAGNOSIS — I2510 Atherosclerotic heart disease of native coronary artery without angina pectoris: Secondary | ICD-10-CM | POA: Diagnosis not present

## 2019-08-18 DIAGNOSIS — R399 Unspecified symptoms and signs involving the genitourinary system: Secondary | ICD-10-CM | POA: Diagnosis not present

## 2019-08-18 DIAGNOSIS — I480 Paroxysmal atrial fibrillation: Secondary | ICD-10-CM | POA: Diagnosis not present

## 2019-08-18 DIAGNOSIS — C3490 Malignant neoplasm of unspecified part of unspecified bronchus or lung: Secondary | ICD-10-CM | POA: Diagnosis not present

## 2019-08-18 DIAGNOSIS — C3491 Malignant neoplasm of unspecified part of right bronchus or lung: Secondary | ICD-10-CM | POA: Diagnosis not present

## 2019-08-18 DIAGNOSIS — M84552D Pathological fracture in neoplastic disease, left femur, subsequent encounter for fracture with routine healing: Secondary | ICD-10-CM | POA: Diagnosis not present

## 2019-08-18 DIAGNOSIS — I5032 Chronic diastolic (congestive) heart failure: Secondary | ICD-10-CM | POA: Diagnosis not present

## 2019-08-18 DIAGNOSIS — M109 Gout, unspecified: Secondary | ICD-10-CM | POA: Diagnosis not present

## 2019-08-18 DIAGNOSIS — I110 Hypertensive heart disease with heart failure: Secondary | ICD-10-CM | POA: Diagnosis not present

## 2019-08-18 DIAGNOSIS — N401 Enlarged prostate with lower urinary tract symptoms: Secondary | ICD-10-CM | POA: Diagnosis not present

## 2019-08-18 DIAGNOSIS — C3432 Malignant neoplasm of lower lobe, left bronchus or lung: Secondary | ICD-10-CM

## 2019-08-18 MED ORDER — LORAZEPAM 1 MG PO TABS: 0.5000 mg | ORAL_TABLET | Freq: Three times a day (TID) | ORAL | 1 refills | Status: AC | PRN

## 2019-08-18 NOTE — Telephone Encounter (Signed)
[name] nurse with [facility] cb and left v/m; sorry for bad connection earlier. pts O2 sats are 85% and there is oxygen at the home and is going to start oxygen. Pt is very agitated, uneven respirations,labored breathing on and off,lips are white, irregular heart beat, urine output is red. [name] is trying to get palliative to start hospice. [name] said could cb and leave message; reception is so bad may not be able to answer. I spoke with [name] nurse with [facility] and she said pain control lis adequate at this time. No fever or chills and no sign of infection. Pt is on 4 L of oxygen and breathing is calm and is not as anxious; pulse ox now is 81% on 4 L of oxygen. Pt is calling for his family. [name] thinks pt is actively dying now. Request order for hospice [facility]. Call pts wife telephone # due to bad connection at pts home.

## 2019-08-18 NOTE — Telephone Encounter (Signed)
Please proceed with hospice eval/treatment.  I would like hospice help and I would like to remain as his attending.  Thanks.

## 2019-08-18 NOTE — Telephone Encounter (Signed)
Greg Hughes (DPR signed) left v/m that pt is extremely anxious and irritable and Greg Hughes is requesting liquid med (having to crush pts meds) to calm pt.  Pt last seen 07/27/19 FU.  [place] nursing should see pt today at 12 noon; Greg Hughes is afraid if she walks out of the room pt might get up and fall. Morphine has not calmed pt at all. Pt had Morphine and Dilaudid this morning at 9 AM and pt is still trying to get up. Greg Hughes said Dr Damita Dunnings advised her to call him if needed anything. Greg Hughes wants to know what can do to keep pt from falling and to keep pt from fighting with Greg Hughes. Carol request med to keep pt from being so anxious. Pt cannot have hospice with daily radiation. Pt has 2 1/2 more weeks of radiation but Greg Hughes does not think pt will last that long. [place].

## 2019-08-18 NOTE — Telephone Encounter (Signed)
-----   Message from Cornelius Moras, RN sent at 08/18/2019  1:27 PM EDT ----- Regarding: Transition to Hospice  Good Afternoon,    I spoke with patient's wife and with Levander Campion, the nurse with [facility]. Patient has declined. SPO2- 81%, color is grey, respirations unlabored and irregular. Patient had periods of restlessness. Patient's wife voiced that palliative radiation has stopped. Would it be possible to have an order for hospice eval and confirm you will remain attending.      Thank you!

## 2019-08-18 NOTE — Telephone Encounter (Signed)
I think it makes sense for Greg Hughes to speak to [name] first and then update [name] as needed.  Thanks.

## 2019-08-18 NOTE — Telephone Encounter (Signed)
Senor, Claudean Kinds, RN  Tonia Ghent, MD; Randall An, RN Good Afternoon,   I spoke with patient's wife and with Levander Campion, the nurse with [facility]. Patient has declined. SPO2- 81%, color is grey, respirations unlabored and irregular. Patient had periods of restlessness. Patient's wife voiced that palliative radiation has stopped. Would it be possible to have an order for hospice eval and confirm you will remain attending.   Thank you!   This msg was sent as a Manufacturing engineer before [place], Am[place] spoke with the nurse. So this msg has been taken care of.

## 2019-08-18 NOTE — Telephone Encounter (Signed)
Spoke with Dr. Konrad Dolores regarding decline in patient and patient's wife desire to transition to hospice services. Dr. Konrad Dolores in agreement with this. Primary Palliative team updated.

## 2019-08-18 NOTE — Telephone Encounter (Signed)
Craige Cotta OT with [facility] requesting order sent to [place] for high back reclining w/c and hoyer lift. I asked Milta Deiters if he had seen pt today and he said last time he saw pt was 08/16/19. I asked Milta Deiters if he knew [name] the nurse with [facility] assigned to pt and he said yes. I asked if he wanted to talk with [name]  Before ordering the DME due to pt having significant changes. Milta Deiters said he knew it was a possibility that pt would decline and Milta Deiters will speak with [name] about pts current condition but request orders for DME equipment sent to [place] because can take up to a wk to get DME to pt. FYI to Dr Damita Dunnings.

## 2019-08-18 NOTE — Telephone Encounter (Signed)
Verbal order received for Hospice eval. Hospice admission department made aware.

## 2019-08-18 NOTE — Telephone Encounter (Signed)
I spoke with Dr Damita Dunnings and he said yes to order for [employer] evaluation and to start hospice care for pt. Dr Damita Dunnings thought that the hospice order and referral had already been taken care of. Mrs Binford answered the phone and I did let her know that Dr Damita Dunnings is sorry for what the pt and she are going thru. Mrs Markwood said thank you and that God had answered prayer that he would not lay and linger and the way things are right now it does not appear his time on earth is going to be much longer. I spoke with Mallie Mussel [facility] nurse and [name] said I would have to call [facility] and give order. Also advised [name] HH of Dr Carole Civil rx for lorazepam being sent to [place] and Dr Josefine Class instructions from this note for the lorazepam. [name] voiced understanding.  I spoke with Langley Gauss at [facility]. Langley Gauss said she would speak with [facility] triage who is already involved and will proceed with Dr Josefine Class order for hospice eval and referral. I emphasized the emergent need and Langley Gauss voiced understanding. I spoke with Mrs Mundie and gave update on hospice referral and she voiced understanding and will let [name] know also. I advised Mrs Gorka to call if anything needed. FYI to Dr Damita Dunnings.

## 2019-08-18 NOTE — Telephone Encounter (Signed)
Yes, noted. Thanks.

## 2019-08-18 NOTE — Telephone Encounter (Signed)
I need some clarification in the situation.  The first issue is to make sure that his pain control is adequate.  If that is not the case then let me know.  Please see if he has any new symptoms otherwise such as fevers or chills or an obvious source of infection.  Assuming his pain is controlled and assuming there is no obvious source of infection otherwise, then it would make sense to use lorazepam as needed for anxiety.  I sent the prescription to the meantime.  They can crush the pills between two spoons if needed (if they do not have a mortar and pestle or a pill crusher otherwise), put one or two drops of water on the powder, and then give it to him orally.  Let me know how that goes.  Thanks.

## 2019-08-18 NOTE — Telephone Encounter (Signed)
Received phone call from [place] with [facility] who reported that patient's home health nurse is in the home and feels that he has declined and family is requesting patient to transition to hospice services. Will contact Primary Palliative RN.

## 2019-08-18 NOTE — Telephone Encounter (Signed)
Discussed with Rena by phone.  I thank all involved.  Please give the order for hospice.  I thought that had previously been arranged.    If pain is adequately controlled and I would continue with lorazepam use in the meantime and use oxygen as needed.  Please let me know if any other needs arise.  Thanks.

## 2019-08-18 NOTE — Telephone Encounter (Signed)
Scheduler answering phone inquired if this nurse could talk to [facility] nurse, [name], who called concerning pt. When phone was picked up nurse was not there. Tried to contact nurse at number she called from , [phone], and had to leave a VM.

## 2019-08-19 ENCOUNTER — Encounter (HOSPITAL_COMMUNITY): Payer: Self-pay | Admitting: Oncology

## 2019-08-19 ENCOUNTER — Inpatient Hospital Stay: Payer: Medicare HMO | Admitting: Oncology

## 2019-08-19 ENCOUNTER — Telehealth: Payer: Self-pay | Admitting: Family Medicine

## 2019-08-19 ENCOUNTER — Telehealth: Payer: Self-pay

## 2019-08-19 ENCOUNTER — Ambulatory Visit: Payer: Medicare HMO

## 2019-08-19 DIAGNOSIS — C3432 Malignant neoplasm of lower lobe, left bronchus or lung: Secondary | ICD-10-CM | POA: Insufficient documentation

## 2019-08-19 DIAGNOSIS — Z510 Encounter for antineoplastic radiation therapy: Secondary | ICD-10-CM | POA: Insufficient documentation

## 2019-08-19 DIAGNOSIS — C7951 Secondary malignant neoplasm of bone: Secondary | ICD-10-CM | POA: Insufficient documentation

## 2019-08-19 MED ORDER — MORPHINE SULFATE 20 MG/5ML PO SOLN: ORAL | 0 refills | Status: AC

## 2019-08-19 MED ORDER — HALOPERIDOL LACTATE 2 MG/ML PO CONC
1.0000 mg | Freq: Three times a day (TID) | ORAL | 0 refills | Status: AC | PRN
Start: 2019-08-19 — End: ?

## 2019-08-19 NOTE — Telephone Encounter (Signed)
I sent the rxs for haldol and morphine.  Thanks. Please let them know.

## 2019-08-19 NOTE — Telephone Encounter (Signed)
Please call wife, check on patient- sig decline with hospice treatment ongoing.  Please let me know about pain, dyspnea, anxiety, etc. Thanks.

## 2019-08-19 NOTE — Telephone Encounter (Signed)
Patient admitted to hospice services on 08/18/2019. Received  Message from PCP to call wife regarding further decline. Hospice triage aware and will contact Primary Hospice team.

## 2019-08-19 NOTE — Telephone Encounter (Signed)
Jennifer with Hospice was calling to ck on status of roxinol and haldol advised Anderson Malta had been sent to [facility]. Anderson Malta voiced understanding and very appreciative.

## 2019-08-19 NOTE — Telephone Encounter (Signed)
Jennifer with Hospice advised.

## 2019-08-19 NOTE — Telephone Encounter (Signed)
Jennifer with hospice called to get orders for script. She stated she would like this done as soon as possible  Roxanol Haldol  She stated these both needed to be the liquid since the patient can not swallow the tablets.   They would like it sent to [facility]

## 2019-08-19 NOTE — Telephone Encounter (Signed)
Hospice was just leaving the residence and will be calling in some Haldol and Morphine for the patient.  He is being taken care of by Hospice who says it could be hours or days.

## 2019-08-20 ENCOUNTER — Ambulatory Visit: Payer: Medicare HMO

## 2019-08-20 NOTE — Telephone Encounter (Signed)
Noted.  I appreciate the help of all involved.

## 2019-08-23 ENCOUNTER — Telehealth: Payer: Self-pay | Admitting: Family Medicine

## 2019-08-23 NOTE — Telephone Encounter (Signed)
Please get update on patient on 08/24/2019.  I left message on voicemail on 08/23/2019.  I did not have any news or an update on the patient over the weekend.  I had called to check on the patient and his wife.  Thanks.

## 2019-08-24 ENCOUNTER — Encounter (HOSPITAL_COMMUNITY): Payer: Self-pay | Admitting: Oncology

## 2019-08-24 ENCOUNTER — Ambulatory Visit: Payer: Medicare HMO

## 2019-08-24 NOTE — Telephone Encounter (Signed)
Called his wife and gave my condolences.  She thanked me for the call.  I was always glad to see this kind gentleman in clinic.

## 2019-08-24 NOTE — Telephone Encounter (Signed)
Patient is listed as deceased now.

## 2019-08-25 ENCOUNTER — Ambulatory Visit: Payer: Medicare HMO

## 2019-08-26 ENCOUNTER — Ambulatory Visit: Payer: Medicare HMO

## 2019-08-27 ENCOUNTER — Ambulatory Visit: Payer: Medicare HMO

## 2019-08-30 ENCOUNTER — Ambulatory Visit: Payer: Medicare HMO

## 2019-08-31 ENCOUNTER — Ambulatory Visit: Payer: Medicare HMO

## 2019-09-01 ENCOUNTER — Ambulatory Visit: Payer: Medicare HMO

## 2019-09-19 DEATH — deceased

## 2019-11-08 ENCOUNTER — Ambulatory Visit: Payer: Medicare HMO | Admitting: Cardiovascular Disease

## 2021-06-26 IMAGING — CT CT HIP*L* W/O CM
2 of 3 series · 17 of 46 positions shown, 19 images · non-contrast
Comparison: MRI dated July 2019

CLINICAL DATA: Severe hip pain.

EXAM:
CT OF THE LEFT HIP WITHOUT CONTRAST
TECHNIQUE: Multidetector CT imaging of the left hip was performed according to
the standard protocol. Multiplanar CT image reconstructions were
also generated.

[Series 5: left hip 2.0 st · axial · 0.52mm/px · z∈[+755,+929]mm · 14 of 101 slices shown, 16 images]
[im 7/101  soft-tissue]
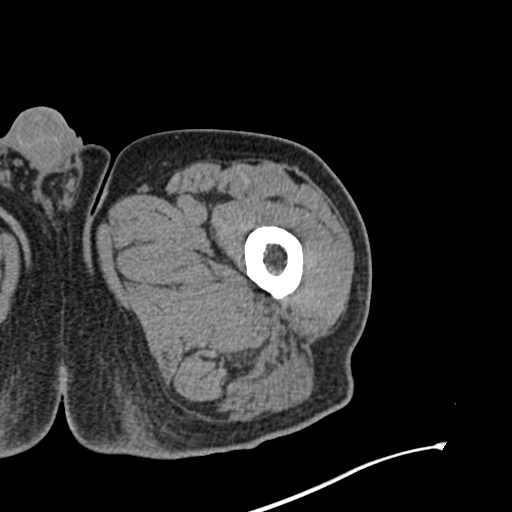
[im 7/101  bone]
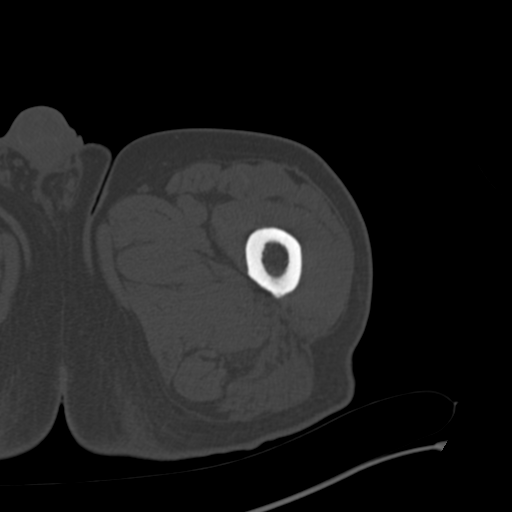
[im 13/101  soft-tissue]
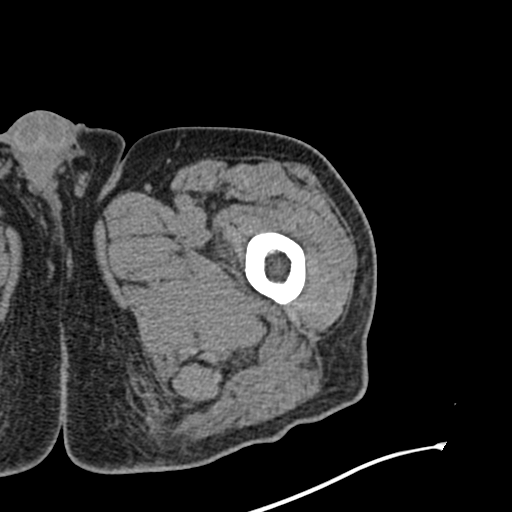
[im 20/101  soft-tissue]
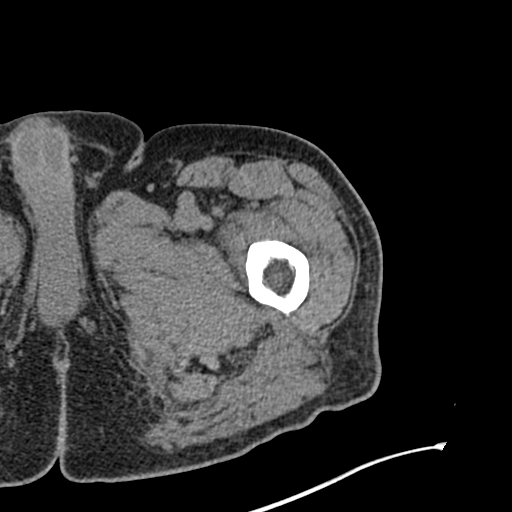
[im 26/101  soft-tissue]
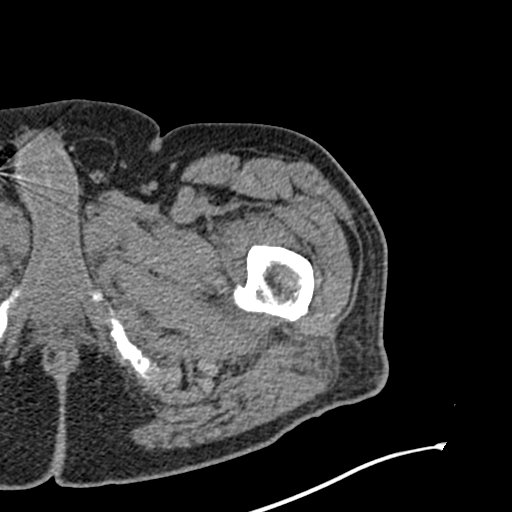
[im 33/101  soft-tissue]
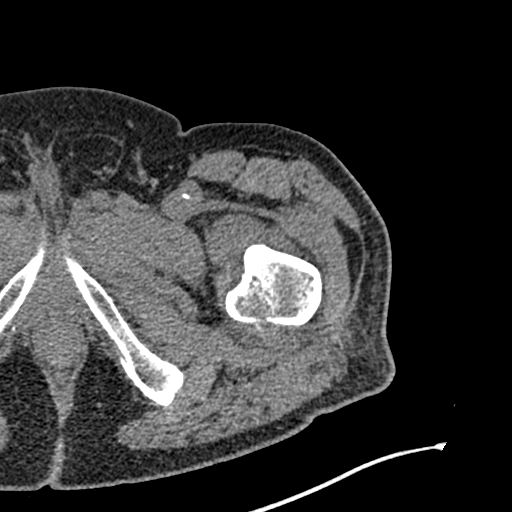
[im 39/101  soft-tissue]
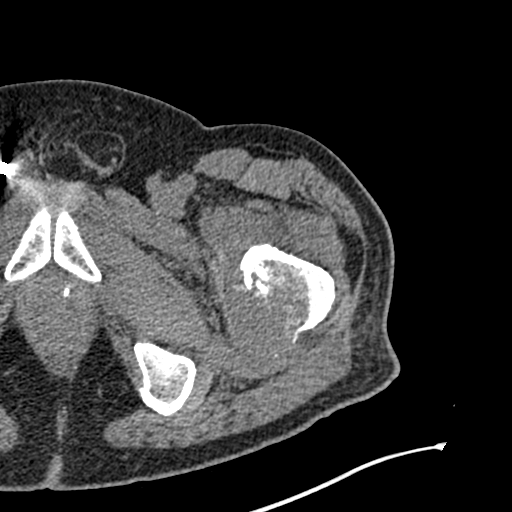
[im 46/101  soft-tissue]
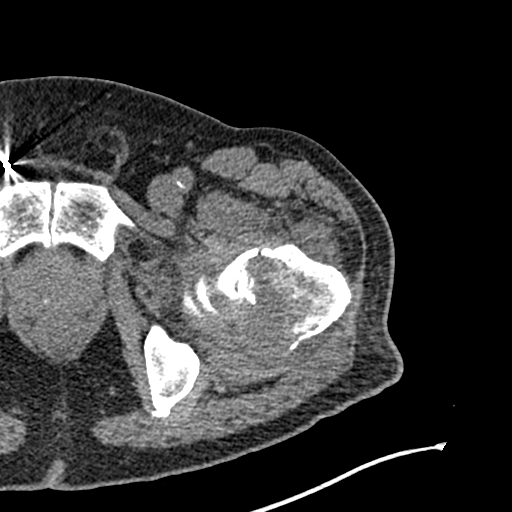
[im 55/101  soft-tissue]
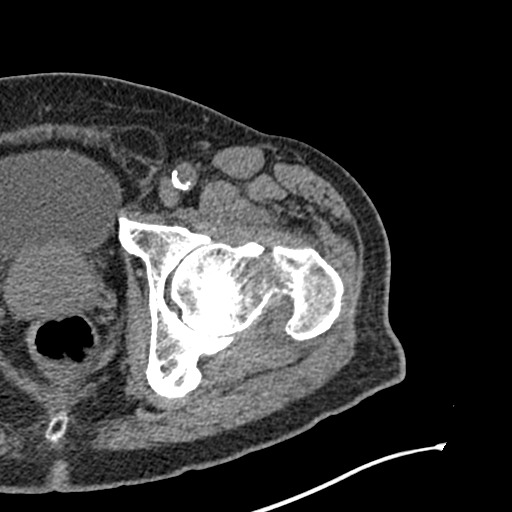
[im 62/101  soft-tissue]
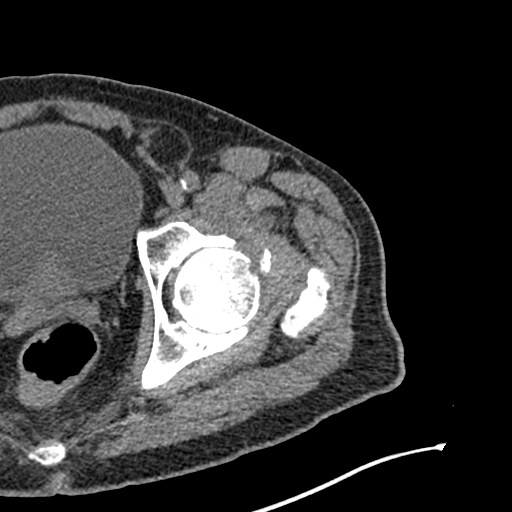
[im 62/101  bone]
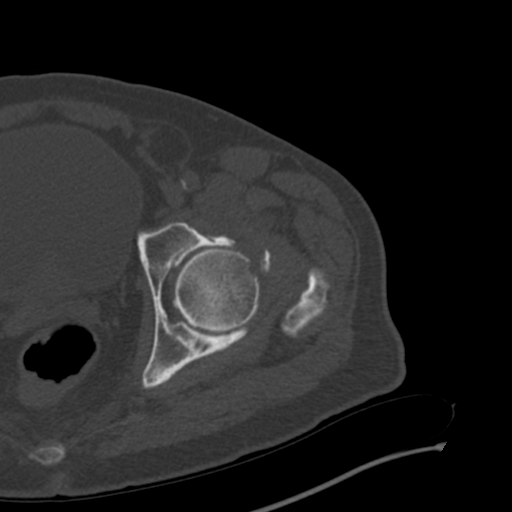
[im 68/101  soft-tissue]
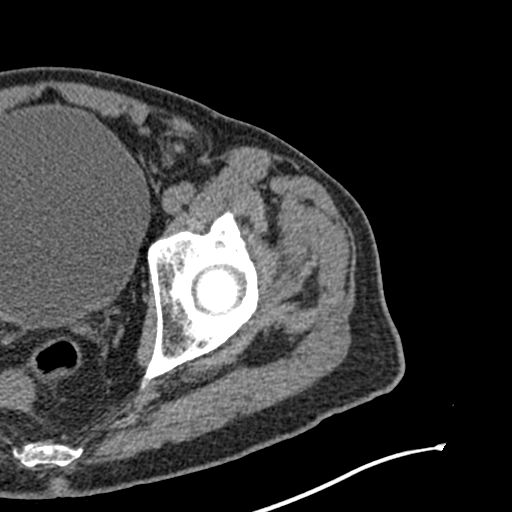
[im 75/101  soft-tissue]
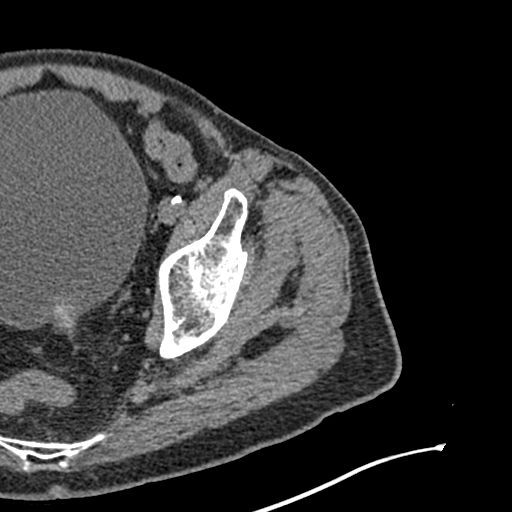
[im 81/101  soft-tissue]
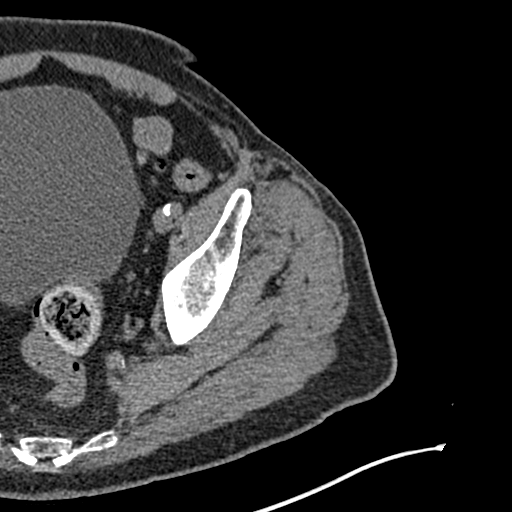
[im 88/101  soft-tissue]
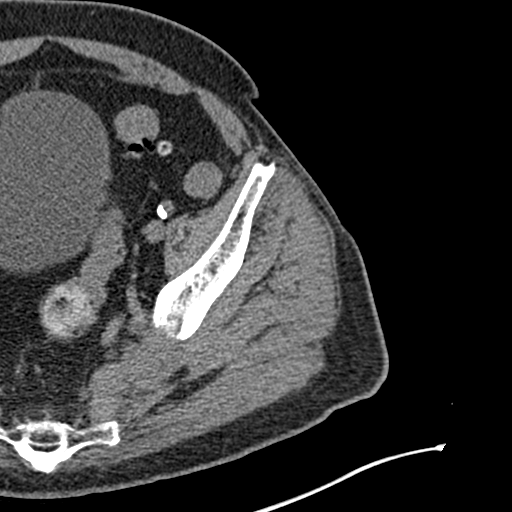
[im 94/101  soft-tissue]
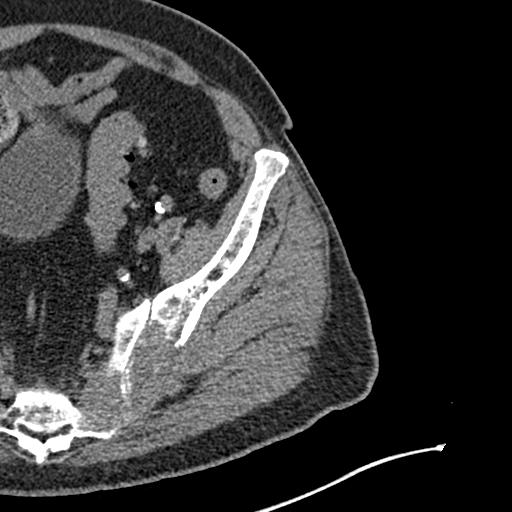

[Series 9: left hip 2.0 cor. st · coronal · 0.39mm/px · 3 of 86 slices shown]
[im 29/86  soft-tissue]
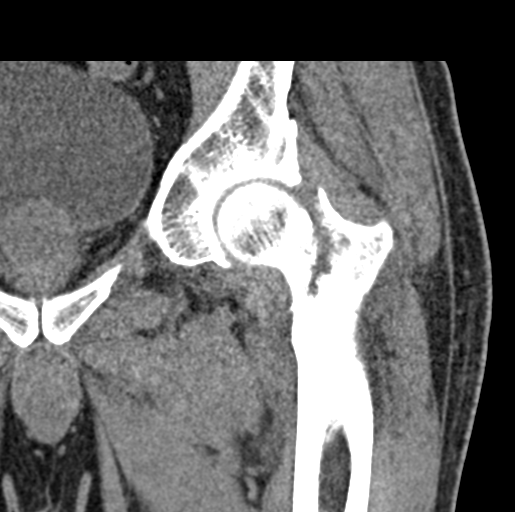
[im 38/86  soft-tissue]
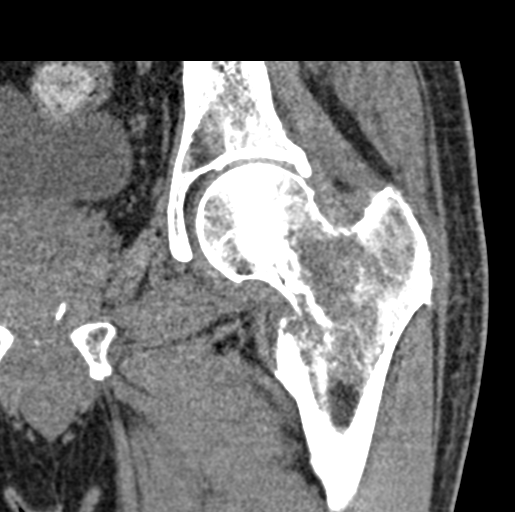
[im 48/86  soft-tissue]
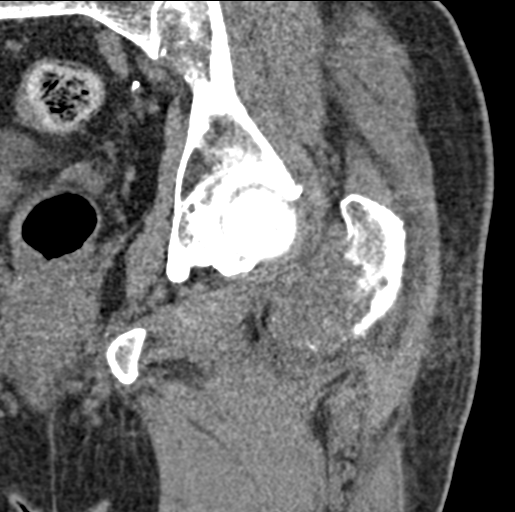

[17 of 46 positions shown; findings below may reference images not displayed]

FINDINGS: Bones/Joint/Cartilage

There is a lytic mass eroding the left sacrum and left iliac bone
measuring approximately 6.5 cm. There is a lytic lesion involving
the left femoral neck with an associated pathologic fracture.

Ligaments

Suboptimally assessed by CT.

Muscles and Tendons

The muscles are unremarkable where visualized.

Soft tissues

There is a small fat containing left inguinal hernia. There is
scattered colonic diverticula without CT evidence for
diverticulitis. The bladder is distended.
IMPRESSION: 1. Lytic lesion involving the left femoral neck with associated
pathologic fracture.
2. There is an additional lytic lesion in the left iliac bone and
left sacrum. Findings are concerning for metastatic disease of
unknown origin.
3. Diverticulosis without CT evidence for diverticulitis.
4. Distended urinary bladder.
5. Fat containing left inguinal hernia.

These results will be called to the ordering clinician or
representative by the Radiologist Assistant, and communication
documented in the PACS or [REDACTED].

## 2021-06-30 IMAGING — RF DG FEMUR 2+V*R*
1 series · 6 of 6 positions shown · non-contrast
Comparison: Preoperative imaging.

CLINICAL DATA: Right intramedullary femoral IM nail for impending
femoral fracture.

EXAM:
RIGHT FEMUR 2 VIEWS; DG C-ARM 1-60 MIN

[Series 1: run · 6 of 6 slices shown]
[im 1/6]
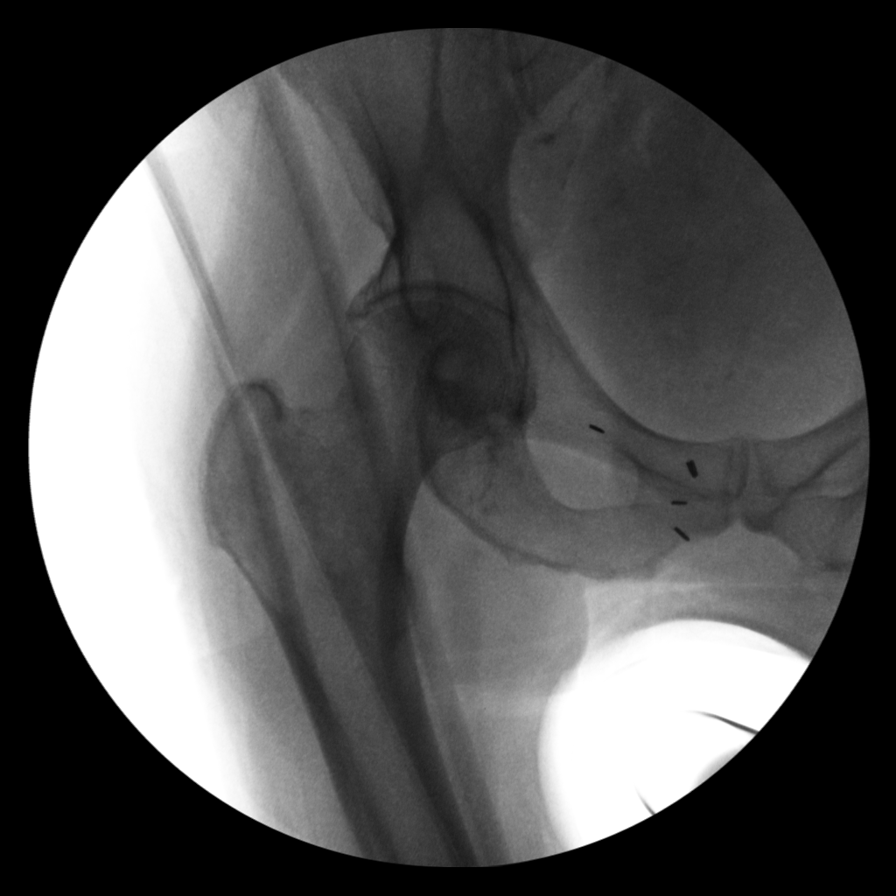
[im 2/6]
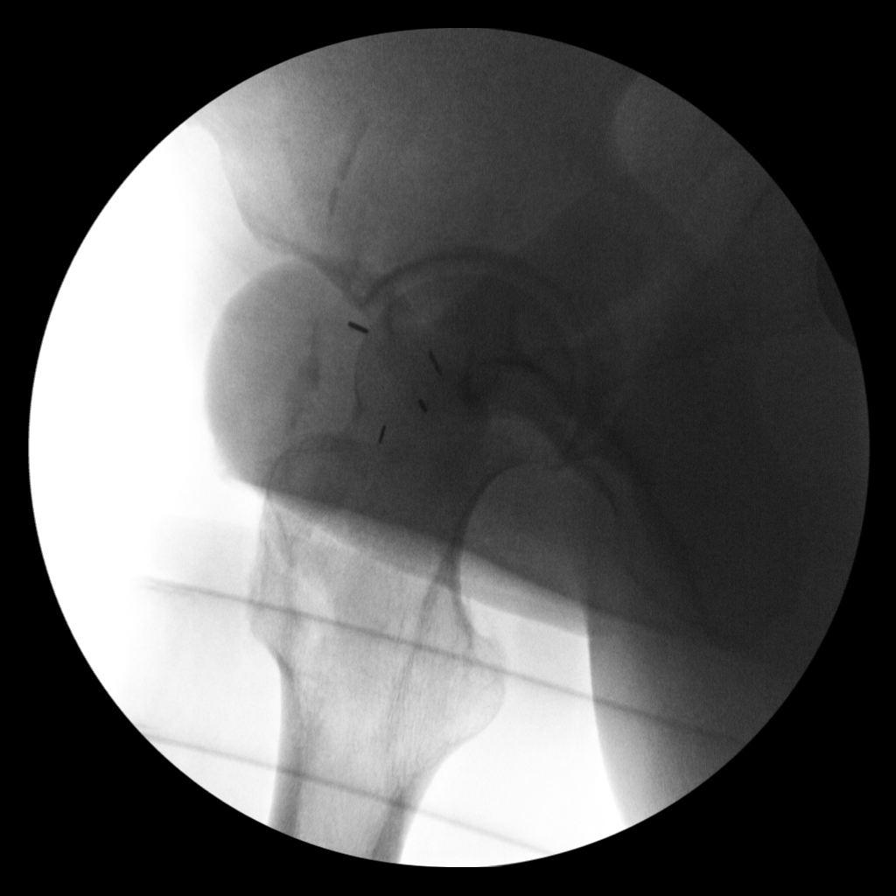
[im 3/6]
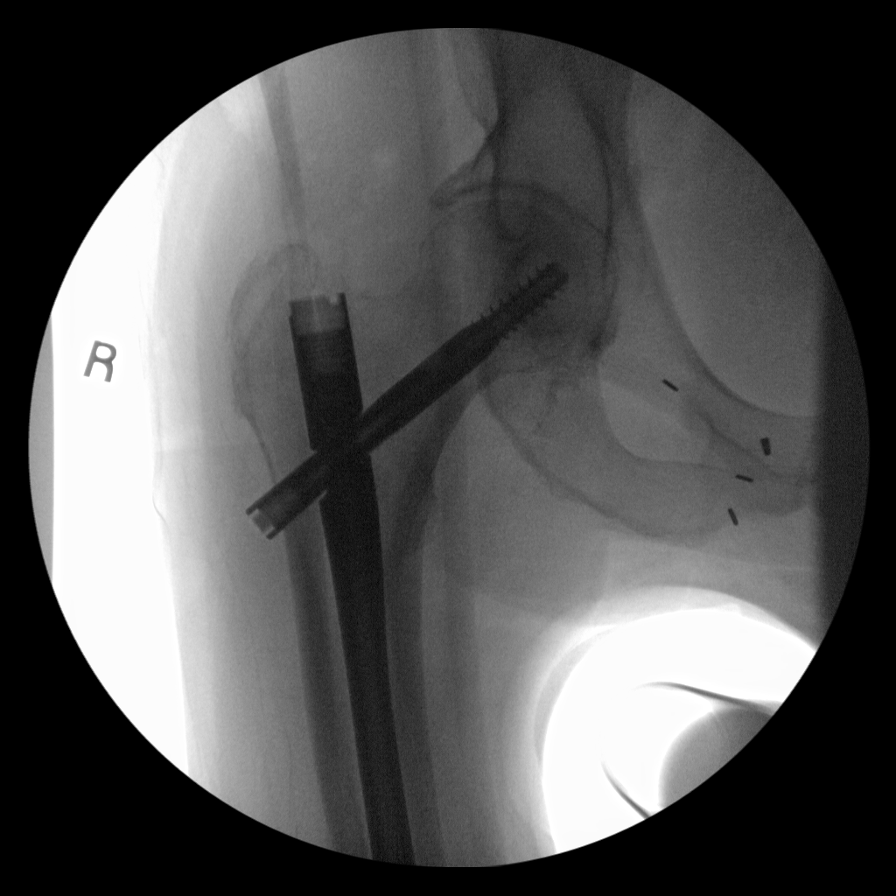
[im 4/6]
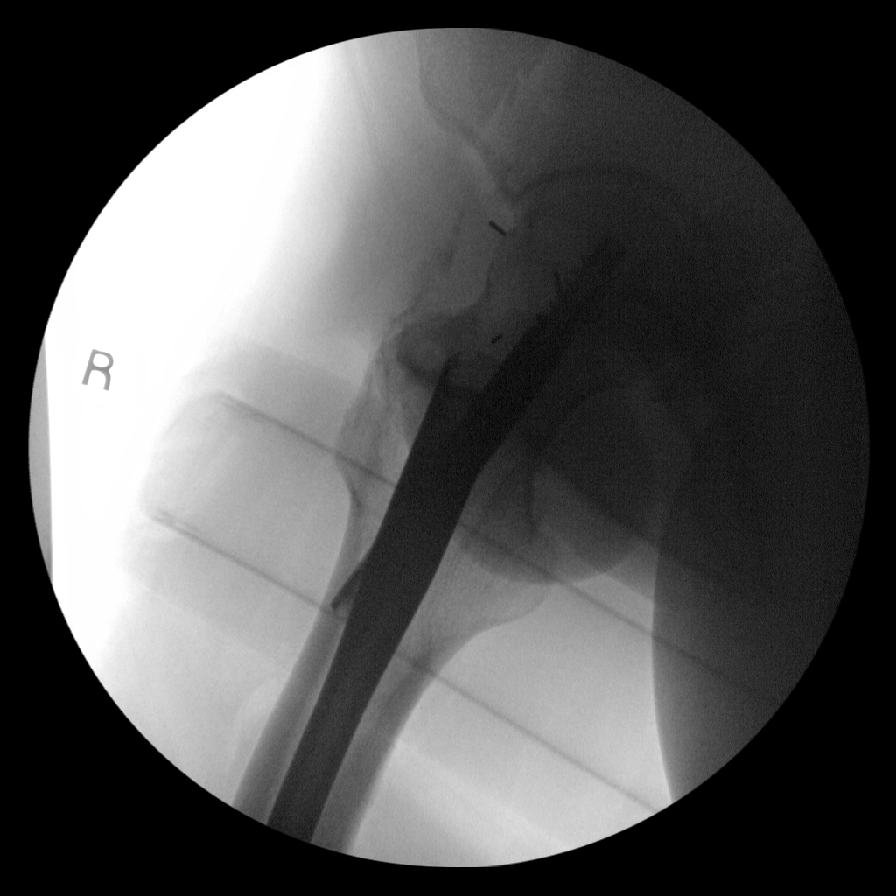
[im 5/6]
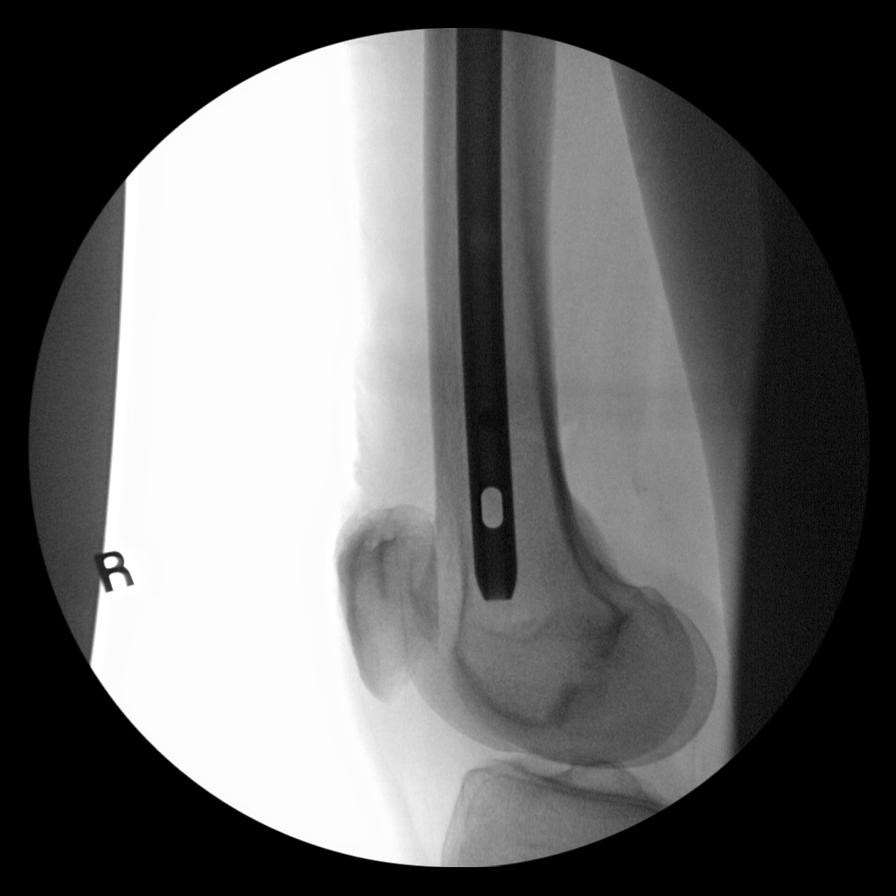
[im 6/6]
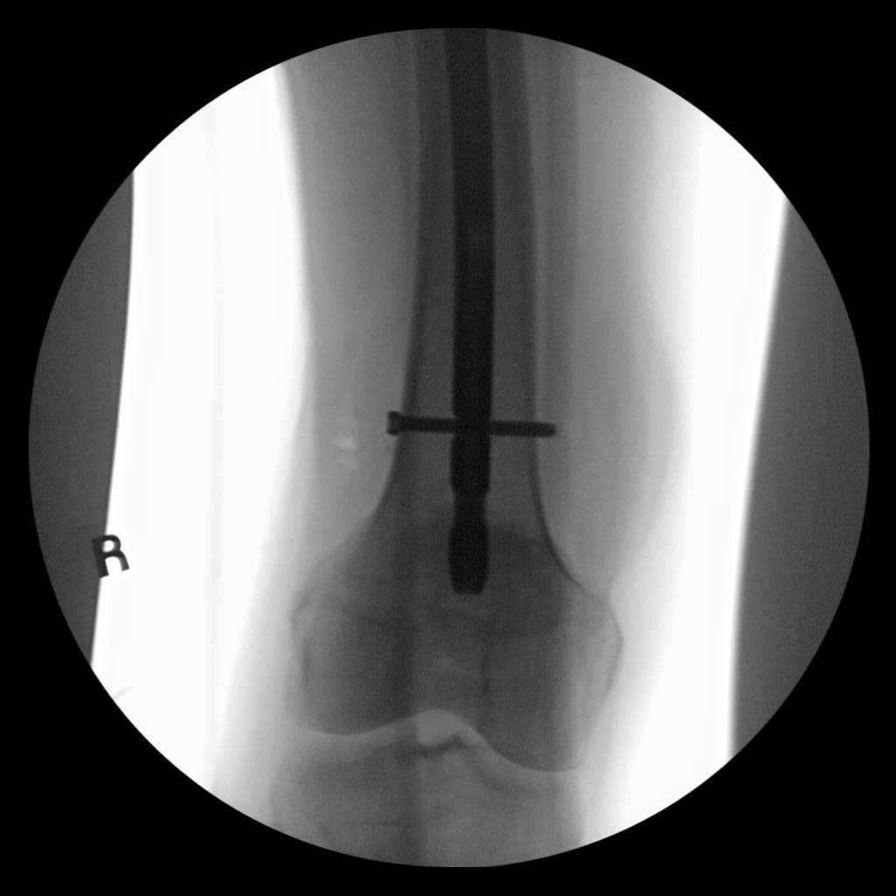

[6 of 6 positions shown; findings below may reference images not displayed]

FINDINGS: Six fluoroscopic spot views obtained in the operating room in
frontal and lateral projections. Placement of intramedullary nail
with trans trochanteric and distal locking screws. Known osseous
metastatic disease is not well visualized on fluoroscopic spot
views. Fluoroscopy time 1 minutes 43 seconds. Dose 7.30 mGy.
IMPRESSION: Fluoroscopic spot views during right femoral intramedullary nail
placement.
